# Patient Record
Sex: Female | Born: 1965 | Race: White | Hispanic: No | Marital: Married | Smoking: Never smoker
Health system: Southern US, Community
[De-identification: ages and names within clinical notes are randomized; demographics above are authoritative.]

## PROBLEM LIST (undated history)

## (undated) DIAGNOSIS — T8859XA Other complications of anesthesia, initial encounter: Secondary | ICD-10-CM

## (undated) DIAGNOSIS — Z860100 Personal history of colon polyps, unspecified: Secondary | ICD-10-CM

## (undated) DIAGNOSIS — T7840XA Allergy, unspecified, initial encounter: Secondary | ICD-10-CM

## (undated) DIAGNOSIS — M1990 Unspecified osteoarthritis, unspecified site: Secondary | ICD-10-CM

## (undated) DIAGNOSIS — F419 Anxiety disorder, unspecified: Secondary | ICD-10-CM

## (undated) DIAGNOSIS — Z87820 Personal history of traumatic brain injury: Secondary | ICD-10-CM

## (undated) DIAGNOSIS — T4145XA Adverse effect of unspecified anesthetic, initial encounter: Secondary | ICD-10-CM

## (undated) DIAGNOSIS — M75100 Unspecified rotator cuff tear or rupture of unspecified shoulder, not specified as traumatic: Secondary | ICD-10-CM

## (undated) DIAGNOSIS — I10 Essential (primary) hypertension: Secondary | ICD-10-CM

## (undated) DIAGNOSIS — K219 Gastro-esophageal reflux disease without esophagitis: Secondary | ICD-10-CM

## (undated) DIAGNOSIS — K5730 Diverticulosis of large intestine without perforation or abscess without bleeding: Secondary | ICD-10-CM

## (undated) DIAGNOSIS — G43909 Migraine, unspecified, not intractable, without status migrainosus: Secondary | ICD-10-CM

## (undated) DIAGNOSIS — L719 Rosacea, unspecified: Secondary | ICD-10-CM

## (undated) HISTORY — PX: VAGINAL HYSTERECTOMY: SUR661

## (undated) HISTORY — DX: Gastro-esophageal reflux disease without esophagitis: K21.9

## (undated) HISTORY — PX: COSMETIC SURGERY: SHX468

## (undated) HISTORY — PX: LAPAROSCOPIC CHOLECYSTECTOMY: SUR755

## (undated) HISTORY — DX: Allergy, unspecified, initial encounter: T78.40XA

## (undated) HISTORY — DX: Unspecified osteoarthritis, unspecified site: M19.90

## (undated) HISTORY — PX: REDUCTION MAMMAPLASTY: SUR839

## (undated) HISTORY — PX: WISDOM TOOTH EXTRACTION: SHX21

## (undated) HISTORY — DX: Essential (primary) hypertension: I10

---
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
## 1999-09-01 ENCOUNTER — Other Ambulatory Visit: Admission: RE | Admit: 1999-09-01 | Discharge: 1999-09-01 | Payer: Self-pay | Admitting: Obstetrics & Gynecology

## 2001-04-05 ENCOUNTER — Other Ambulatory Visit: Admission: RE | Admit: 2001-04-05 | Discharge: 2001-04-05 | Payer: Self-pay | Admitting: Obstetrics & Gynecology

## 2002-02-12 ENCOUNTER — Encounter (INDEPENDENT_AMBULATORY_CARE_PROVIDER_SITE_OTHER): Payer: Self-pay | Admitting: Specialist

## 2002-02-12 ENCOUNTER — Observation Stay (HOSPITAL_COMMUNITY): Admission: RE | Admit: 2002-02-12 | Discharge: 2002-02-13 | Payer: Self-pay | Admitting: Obstetrics & Gynecology

## 2002-02-13 ENCOUNTER — Inpatient Hospital Stay (HOSPITAL_COMMUNITY): Admission: AD | Admit: 2002-02-13 | Discharge: 2002-02-13 | Payer: Self-pay | Admitting: Obstetrics and Gynecology

## 2002-03-05 ENCOUNTER — Ambulatory Visit (HOSPITAL_COMMUNITY): Admission: RE | Admit: 2002-03-05 | Discharge: 2002-03-05 | Payer: Self-pay | Admitting: Plastic Surgery

## 2002-03-05 ENCOUNTER — Encounter (INDEPENDENT_AMBULATORY_CARE_PROVIDER_SITE_OTHER): Payer: Self-pay | Admitting: Specialist

## 2007-09-25 ENCOUNTER — Encounter: Admission: RE | Admit: 2007-09-25 | Discharge: 2007-09-25 | Payer: Self-pay | Admitting: Obstetrics & Gynecology

## 2008-02-19 ENCOUNTER — Ambulatory Visit (HOSPITAL_COMMUNITY): Admission: RE | Admit: 2008-02-19 | Discharge: 2008-02-19 | Payer: Self-pay | Admitting: Obstetrics & Gynecology

## 2008-06-03 ENCOUNTER — Ambulatory Visit (HOSPITAL_COMMUNITY): Admission: RE | Admit: 2008-06-03 | Discharge: 2008-06-03 | Payer: Self-pay | Admitting: General Surgery

## 2008-06-03 ENCOUNTER — Encounter (INDEPENDENT_AMBULATORY_CARE_PROVIDER_SITE_OTHER): Payer: Self-pay | Admitting: General Surgery

## 2008-09-25 ENCOUNTER — Encounter: Admission: RE | Admit: 2008-09-25 | Discharge: 2008-09-25 | Payer: Self-pay | Admitting: Specialist

## 2010-10-10 ENCOUNTER — Encounter: Payer: Self-pay | Admitting: Obstetrics & Gynecology

## 2011-02-01 NOTE — Op Note (Signed)
Monica Petty, Monica Petty NO.:  [id number]   MEDICAL RECORD NO.:  [id number]          PATIENT TYPE:  AMB   LOCATION:  DAY                          FACILITY:  [facility]   PHYSICIAN:  Adolph Pollack, M.D.DATE OF BIRTH:  [birth year]   DATE OF PROCEDURE:  06/03/2008  DATE OF DISCHARGE:  06/03/2008                               OPERATIVE REPORT   PREOPERATIVE DIAGNOSIS:  Gallbladder polyp.   POSTOPERATIVE DIAGNOSIS:  Gallbladder polyp.   PROCEDURE:  Laparoscopic cholecystectomy, intraoperative cholangiogram.   SURGEON:  Adolph Pollack, M.D.   ASSISTANT:  Ollen Gross. Vernell Morgans, M.D.   ANESTHESIA:  General.   INDICATIONS:  Mrs. Sahakian is a 45 year old female who has been having  intermittent bouts of right upper quadrant discomfort with nausea.  She  always can feel some discomfort.  She underwent an abdominal ultrasound  that demonstrated what appeared to be at least an 8-mm size polyp but no  wall thickening and pericholecystic fluid.  She continues to have  symptoms and has elected to proceed with laparoscopic cholecystectomy.  The true nature of the polyp is unknown.   TECHNIQUE:  She was brought to the operating room, placed supine on the  operating table and a general anesthetic was administered.  Her  abdominal wall was sterilely prepped and draped.  Marcaine solution was  infiltrated into the subumbilical region.  A small subumbilical incision  was made through the skin, subcutaneous tissue, fascia and peritoneum,  entering the peritoneal cavity under direct vision.  A pursestring  suture of 0 Vicryl was placed  around the fascial edges.  A Hasson  trocar was introduced into the peritoneal cavity and pneumoperitoneum  created by insufflation of CO2 gas.   Next the laparoscope was introduced.  The ovaries and appendix appeared  normal.  The liver appeared to be normal.   She was placed in reverse Trendelenburg position, the right side tilted  slightly up.   An 11-mm trocar was placed in an epigastric incision and  two 5-mm trocars were placed in the right upper quadrant.  The fundus of  gallbladder was grasped, and there were no acute inflammatory changes.  It was retracted toward the right shoulder.  The infundibulum was  grasped and using blunt dissection on the gallbladder, the infundibulum  was mobilized.  I then identified the cystic duct and created a window  around it.  I also identified an anterior branch of the cystic artery.  I create a window around the anterior branch of the cystic artery which  was anterior to the cystic duct.  This anterior branch of the cystic  artery was then clipped and divided.  I then placed a clip at the  gallbladder cystic duct junction and made an incision in the cystic  duct, milking some bile back.  The cholangiocatheter was passed through  the anterior abdominal wall, placed into the cystic duct, and a  cholangiogram was performed.   Under real time fluoroscopy dilute contrast was injected into the cystic  duct, which was of moderate-to-long length.  The common hepatic, right  and left hepatic, common  bile ducts all filled promptly and contrast  drained promptly into the duodenum without obvious evidence of  obstruction.  Final report is pending the radiologist's interpretation.   The cholangiocatheter was removed, the cystic duct was then clipped 3  times on the biliary side and divided.  I identified the posterior  branch of the cystic artery close to gallbladder and clipped and divided  it.  The gallbladder was then dissected from the liver using  electrocautery.  There was a small spillage of bile.  The gallbladder  then placed in an Endopouch sack.   The gallbladder fossa was copiously irrigated with saline solution and  bleeding controlled with electrocautery.  The area was then inspected  again and there was no bleeding or bile leak.  I evacuated the fluid as  much as possible.   The  gallbladder was then removed in the bag through the subumbilical  port.  The subumbilical fascial defect was closed under laparoscopic  vision by tightening up and tying down the pursestring suture.  The  remaining trocars were removed and pneumoperitoneum was released.  The  skin incisions were closed with 4-0 Monocryl subcuticular stitches,  followed by Steri-Strips and sterile dressings.  She tolerated the  procedure without any apparent complications and was taken to recovery  in satisfactory condition.      Adolph Pollack, M.D.  Electronically Signed     TJR/MEDQ  D:  06/03/2008  T:  06/04/2008  Job:  [id number]   cc:   Freddy Finner, M.D.  Fax: [phone]

## 2011-02-04 NOTE — Op Note (Signed)
[facility]  Patient:    Monica Petty, Monica Petty Visit Number: [id number] MRN: [id number]          Service Type: GYN Location: 910A 9118 01 Attending Physician:  Minette Headland Dictated by:   Freddy Finner, M.D. Proc. Date: 02/12/02 Admit Date:  02/12/2002                             Operative Report  PREOPERATIVE DIAGNOSES:       1. Uterine fibroid.                               2. Dysfunctional uterine bleeding unresponsive                                  to oral contraceptives and cyclic progestin                                  therapy.  POSTOPERATIVE DIAGNOSES:      1. Uterine fibroid.                               2. Dysfunctional uterine bleeding unresponsive                                  to oral contraceptives and cyclic progestin                                  therapy.  OPERATION:                    Total vaginal hysterectomy.  SURGEON:                      Freddy Finner, M.D.  ASSISTANT:                    Marcelle Overlie, M.D.  ANESTHESIA:                   Spinal with intravenous sedation.  ESTIMATED BLOOD LOSS:         Less than or equal to 100 cc.  COMPLICATIONS:                None.  INDICATIONS:                  The patient is a 45 year old whose Present Illness is recorded in the admission note.  She was admitted on the day of surgery.  DESCRIPTION OF PROCEDURE:     She was given a bolus of Cefotan IV, plus the PAS hose, and brought to the operating room, and there placed under adequate spinal anesthesia.  Placed in the dorsolithotomy position.  Prep of the lower abdomen, perineum, and vagina was carried out in the usual fashion.  The bladder was evacuated with the [facility] catheter.  Sterile drapes were applied.  A posterior weighted vaginal retractor was placed.  The cervix was grasped with the [facility] tenaculum.  The mucosa posterior to the cervix was tinted with an Allis and  incised sharply with the Mayo  scissors to enter the peritoneal cavity.  The cervix was circumscribed with the scalpel.  The LigaSure system was then used to develop the uterosacral ligaments which were sealed and divided sharply.  The bladder was advanced off the cervix.  Bladder pillars were taken with the LigaSure system, sealed, and divided.  The bladder was further advanced off the cervix and the anterior peritoneum entered. Cardinal ligament pedicles were taken with the LigaSure system, sealed and divided.  Vascular pedicles were taken with the same system in a similar fashion.  The uterus was delivered through the vaginal introitus. Utero-ovarian pedicles were sealed with the LigaSure system and divided sharply.  Hemostasis was adequate at this level.  Tubes and ovaries were inspected and normal.  A moist tape was placed to improve exposure.  The ankles of the vagina were anchored to the uterosacrals with a mattress suture of 0 Monocryl.  The uterosacrals were plicated and the posterior peritoneum closed with interrupted 2-0 Monocryl.  The cuff was closed vertically with figure-of-eights of 0 Monocryl.  Hemostasis was complete.  Foley catheter was placed.  The patient was taken to recovery in good condition. Dictated by:   Freddy Finner, M.D. Attending Physician:  Minette Headland DD:  02/12/02 TD:  02/12/02 Job: [id number]

## 2011-02-04 NOTE — Discharge Summary (Signed)
[facility]  Patient:    Monica Petty, Monica Petty Visit Number: [id number] MRN: [id number]          Service Type: MED Location: [facility] Attending Physician:  Marcelle Overlie Dictated by:   Freddy Finner, M.D. Admit Date:  02/13/2002 Disc. Date: 02/13/02                             Discharge Summary  DISCHARGE DIAGNOSIS:          Uterine leiomyomata, menometrorrhagia                               nonresponsive to conservative management.  OPERATIVE PROCEDURE:          Total vaginal hysterectomy.  POSTOPERATIVE COMPLICATIONS:    None.  DISPOSITION:                  The patient was in satisfactory and improved condition at the time of discharge.  She is to have progressively increasing physical activity without heavy lifting or vaginal entry.  She is to report fever or heavy bleeding.  She is to return to the office in two weeks for follow up. She is to take a regular diet.  She is given Darvocet to be taken as needed for postoperative pain.  Details of the HISTORY OF PRESENT ILLNESS, PAST MEDICAL HISTORY, FAMILY HISTORY, REVIEW OF SYSTEMS, and PHYSICAL EXAMINATION:  According to the admission note.  PHYSICAL FINDINGS:            On admission were remarkable for the pelvic exam with uterine enlargement secondary to fibroids which was confirmed by pelvic ultrasound.  LABORATORY DATA:              Preoperative hemoglobin of 12.2, postoperative hemoglobin of 12.4.  Prothrombin time, PTT and INR were normal on admission.  HOSPITAL COURSE:              The patient was admitted on the morning of surgery.  She was placed in PAS hose.  She was given a gram of Cefotan IV preoperatively.  She was brought to the operating room where the above described procedure was accomplished under spinal anesthesia.  Her postoperative course was without complication.  She remained afebrile for 24 hours postoperatively. Her postoperative hemoglobin was adequate.  By  the morning of the first postoperative day she was ambulating without difficulty, having adequate bowel and bladder function.  She was taking a regular diet. She was discharged home with disposition as noted above. Dictated by:   Freddy Finner, M.D. Attending Physician:  Marcelle Overlie DD:  02/13/02 TD:  02/14/02 Job: [id number]

## 2011-02-04 NOTE — H&P (Signed)
[name]  Patient:    Monica Petty, Monica Petty. Visit Number: [id number] MRN: [id number]          Service Type: Attending:  Freddy Finner, M.D. Dictated by:   Freddy Finner, M.D. Adm. Date:  02/12/02                           History and Physical  ADMITTING DIAGNOSIS:          Uterine leiomyomata and menometrorrhagia, unresponsive to oral contraceptives and cyclic progestin administration.  HISTORY OF PRESENT ILLNESS:   The patient is a 45 year old white married female, gravida 2, para 2, who has had a long history of dysfunctional uterine bleeding with long periods of persistent bleeding unresponsive to oral contraceptives and to cyclic progestin withdrawal.  She has also experienced some increasing blood pressure on oral contraceptives.  She is known to have fibroids documented by clinical exam and by pelvic ultrasound.  She has requested definitive surgical intervention and is admitted at this time for vaginal hysterectomy.  The patient is known to have hypertension in the past on hormonal therapies.  REVIEW OF SYSTEMS:            Otherwise negative.  No cardiopulmonary, GI or GU complaints.  PAST MEDICAL HISTORY:         The patient has no known significant medical illnesses.  She has environmental allergies.  She has previously had head trauma from a skull fracture while riding a horse.  She has had two vaginal births.  She has never had a blood transfusion.  ALLERGIES TO MEDICATIONS:     CODEINE and PENICILLIN.  SOCIAL HISTORY:               She only occasionally uses alcohol socially. She does not use cigarettes.  FAMILY HISTORY:               Noncontributory.  PHYSICAL EXAMINATION:  VITAL SIGNS:                  Blood pressure 130/79 on her check at the hospital (she is a Designer, jewellery at [employer]).  HEENT:                        Grossly within normal limits.  NECK:                         Thyroid gland is  not palpably enlarged.  CHEST:                        Clear to auscultation.  HEART:                        Normal sinus rhythm without murmurs, rubs or gallops.  ABDOMEN:                      Soft and nontender without appreciable organomegaly or palpable masses.  EXTREMITIES:                  Without cyanosis, clubbing or edema.  PELVIC EXAM:                  External genitalia, vagina, cervix are normal. Bimanual reveals the uterus to be slightly enlarged.  There are no palpable adnexal masses.  The rectum  is palpably normal. Rectovaginal exam confirms.  BREAST EXAM:                  Normal.  Recent baseline mammogram in August of 2002 was normal.  ASSESSMENT:                   Uterine fibroids and dysfunctional uterine bleeding, unresponsive to hormone therapy.  PLAN:                         Total vaginal hysterectomy.  The patient has reviewed a video in the office describing the operative procedure, the potential for additional procedures including laparoscopy at the time of surgery has been discussed and accepted.  The patient is admitted now for surgery. Dictated by:   Freddy Finner, M.D. Attending:  Freddy Finner, M.D. DD:  02/11/02 TD:  02/11/02 Job: [id number]

## 2011-02-04 NOTE — Op Note (Signed)
[facility]  Patient:    Monica Petty, Monica Petty Visit Number: [id number] MRN: [id number]          Service Type: DSU Location: 9300 [id number] 01 Attending Physician:  Loura Halt Ii Dictated by:   Alfredia Ferguson, M.D. Proc. Date: 03/05/02 Admit Date:  03/05/2002 Discharge Date: 03/05/2002                             Operative Report  PREOPERATIVE DIAGNOSIS:       Bilateral macromastia with excess fat in bilateral axillary area.  POSTOPERATIVE DIAGNOSIS:      Bilateral macromastia with excess fat in bilateral axillary area.  OPERATION:                    Bilateral reduction mammoplasty.  Left side 1060 grams removed, right side 970 grams removed.  Bilateral axillary suction-assisted lipoplasty.  Left side 140 grams removed, right side 125 grams removed.  SURGEON:                      Alfredia Ferguson, M.D.  ASSISTANT:  ANESTHESIA:                   General endotracheal anesthesia.  ESTIMATED BLOOD LOSS:  INDICATIONS:                  This is a 45 year old woman who has DD breasts who complains of their weight.  This excess weight causes neck and back pain. She wishes to have her breasts reduced to a full C small D if possible.  The patient understands the risks of surgery including overreduction, underreduction, bleeding, hematoma, infection, unsightly scarring, permanent numbness of the nipple, vascular compromise to the nipple with loss of the nipple due to lack of circulation, inability to lactate, asymmetry, and overall dissatisfaction with the results.  She also has a moderate amount of excess fat which extends around her axilla into her back.  The patient is moderately overweight.  Rather than leaving this excess skin and fat, my plan is to carry out liposuction in the axillary region.  Hopefully this will contour this area down, so that the fullness in the axillary region will not be as noticable.  The patient understands the risks  of liposuction including overreduction, underreduction, contour irregularities, infection, bleeding, seroma, pain, and overall dissatisfaction with the results.  DESCRIPTION OF PROCEDURE:     With the patient in the standing position, skin marks were placed outlining a Wise skin pattern and an inferior pedicle technique.  Skin marks were also placed in the bilateral axillary region where the excess fat extended around to the back.  The patient was taken to the operating room where she was given general endotracheal anesthesia.  Chest was prepped with Betadine and she was draped with sterile drapes.  Attention was first directed to the right breast.  An 8 cm wide inferiorly-based pedicle was marked and within the confines of this pedicle, a 42 mm diameter circle was drawn around the nipple.  This skin mark outlining the pedicle was incised and the 42 mm diameter circle around the nipple was also incised.  The pedicle was deepithelialized.  The pedicle was now dissected away from the surrounding breast tissue dissecting straight down to the chest wall medially and obliquely down to the chest wall superiorly and laterally to ensure adequate connections to the chest wall for  vascular integrity of the pedicle.  Once the pedicle had been dissected away from the surrounding breast tissue, the remainder of my incisions were made at the previously placed skin marks.  The medial, superior, and lateral breast flaps were now elevated away from the excess dermaglandular tissue.  The medial breast flap was thinned to approximately 3 to 4 cm in thickness.  A similar thickness in the superior breast flap was left behind.  The lateral breast flap was thinned to approximately 2 to 3 cm in thickness.  Once these flaps had been elevated up to the level of the chest wall, this left the excess tissue still attached to the chest wall between the pedicle and the dissected breast flaps.  The excess dermaglandular  tissue was now removed from the chest wall beginning medially and dissecting it off the chest wall from a medial to a lateral direction. Tissue was passed off for weight.  The right side weighed a total of 970 grams.  Nipple areolar complex appeared to be viable.  The wound was copiously irrigated using saline irrigation.  Hemostasis was carried out meticulously throughout the dissection to minimize blood loss.  Once hemostasis had been assured, tumescent solution was infiltrated into the right axillary region. The tumescent solution consisted of 1 liter of Ringers lactate plus 20 cc of 1% Xylocaine plain plus 1 amp of epinephrine 1:1000.  A total of approximately 400 cc was infiltrated in the area that liposuction would be carried out. Once the wound was inspected again for hemostasis, closure was begun.  The inferior corner of the vertical limb of my incision was approximated using a 2-0 Vicryl suture to the midportion of the inframammary crease incision. A similar suture was used to approximate the superior corner of the medial and lateral breast flaps at the vertical incision.  The nipple areolar complex was placed in its new location.  Temporary staples were placed to approximate my skin edges.  Prior to completely closing the incision, liposuction was carried out in the right axillary region.  A 3.4 mm cannula was used.  The area of excess fat was pretunneled and then suction was applied.  Suction carried out in nearly a bloodless fashion.  A total of 125 cc of fat was removed.  This was separated fat with minimal infranatent.  The skin contoured down nicely. Closure was now carried out simultaneous to me performing the left-sided reduction.  The left-sided reduction was carried out in an identical fashion to the right side.  A total of 1060 grams was removed from the left side. Again prior to beginning closure, tumescent solution was infiltrated in the area where liposuction was to be  carried out in the left axillary region. 400 cc was also placed in the left axillary region.  The wound on the left was  copiously irrigated with saline irrigation and hemostasis was assured.  The wound was temporarily approximated in a similar fashion to the right side. Liposuction was then carried out removing a total of 140 grams from the left side. The skin contoured down nicely.  Closure was now carried out on the left side in an identical fashion to the right side.  Closure on both sides consisted of interrupted 2-0 and 3-0 Vicryl for the dermis for the incisions, a running 4-0 Monocryl subcuticular was placed in the skin edges.  Symmetry appeared to be acceptable at the conclusion of the procedure.  Nipple areolar complexes were viable.  The patients chest was cleansed with  Betadine and dried.  Steri-Strips were applied to the incision.  A bulky dressing was placed around the wound.  A circumferential wrap using two 6 inch Ace bandages were placed.  The patient was awakened, extubated, and transported to the recovery room in satisfactory condition. Dictated by:   Alfredia Ferguson, M.D. Attending Physician:  Loura Halt Ii DD:  03/05/02 TD:  03/06/02 Job: [id number]

## 2011-06-22 LAB — DIFFERENTIAL
Basophils Absolute: 0
Basophils Relative: 1
Eosinophils Absolute: 0.1
Monocytes Relative: 8
Neutro Abs: 4.4
Neutrophils Relative %: 61

## 2011-06-22 LAB — COMPREHENSIVE METABOLIC PANEL
ALT: 21
Alkaline Phosphatase: 53
BUN: 13
CO2: 29
Chloride: 107
GFR calc non Af Amer: >60
Glucose, Bld: 115 — ABNORMAL HIGH
Potassium: 4
Sodium: 144
Total Bilirubin: 0.7
Total Protein: 6.3

## 2011-06-22 LAB — CBC
HCT: 42.3
Hemoglobin: 14.2
RBC: 4.65
RDW: 13.1
WBC: 7.1

## 2014-12-02 ENCOUNTER — Ambulatory Visit: Payer: Self-pay | Attending: Orthopedic Surgery | Admitting: Occupational Therapy

## 2014-12-02 ENCOUNTER — Encounter: Payer: Self-pay | Admitting: Occupational Therapy

## 2014-12-02 DIAGNOSIS — R29898 Other symptoms and signs involving the musculoskeletal system: Secondary | ICD-10-CM | POA: Insufficient documentation

## 2014-12-02 DIAGNOSIS — M25521 Pain in right elbow: Secondary | ICD-10-CM | POA: Insufficient documentation

## 2014-12-02 NOTE — Therapy (Signed)
[facility] [address] [place], [place], [address] Phone: [phone]   Fax:  [phone]  Occupational Therapy Evaluation  Patient Details  Name: Monica Petty MRN: [id number] Date of Birth: 03-May-1966 Referring Provider:  Roseanne Kaufman, MD  Encounter Date: 12/02/2014      OT End of Session - 12/02/14 1403    Visit Number 1   Number of Visits 17   Date for OT Re-Evaluation 02/01/15   Authorization Type Workers Comp approved 12 visits    Authorization - Visit Number 1   Authorization - Number of Visits 12   OT Start Time 1105   OT Stop Time 1148   OT Time Calculation (min) 43 min   Activity Tolerance Patient tolerated treatment well   Behavior During Therapy [facility] for tasks assessed/performed      No past medical history on file.  Past Surgical History  Procedure Laterality Date  . Abdominal hysterectomy    . Breast surgery      reduction  . Wisdom tooth extraction      There were no vitals filed for this visit.  Visit Diagnosis:  Pain, elbow joint, right  Weakness of right arm      Subjective Assessment - 12/02/14 1111    Symptoms pain    Pertinent History Pt injured 09/08/14 and 09/23/14 moving large pts while at work in [place] at [employer].  Pt is now wearing elbow extension brace at night and had 1 PT evaluation (scheduled by mistake as pt required to have therapy at [facility] due to Workers Comp)   Limitations light duty at work   Repetition Increases Symptoms   Patient Stated Goals use of arm/elbow without pain   Currently in Pain? Yes   Pain Score --  0-5/10   Pain Location Arm  elbow   Pain Orientation Right;Medial;Lateral   Pain Descriptors / Indicators Sore;Dull   Pain Onset More than a month ago   Aggravating Factors  pressure at elbow, use   Pain Relieving Factors Voltaran gel, massage, heat, cold   Effect of Pain on Daily Activities on light duty at work           [facility] OT  Assessment - 12/02/14 0001    Assessment   Diagnosis Medial epicondylitis of R elbow, lateral epicondylitis of R elbow, pain and swelling of R elbow   Onset Date 09/08/14  also injured 09/23/14   Assessment script includes modalities prn and exercise specificallly   Prior Therapy 1 PT session at [facility]   Precautions   Precaution Comments no pulling, no lifting more than 20lbs, light duty at work   Required Braces or Orthoses --  has elbow extension splint for night wear   Balance Screen   Has the patient fallen in the past 6 months No   Has the patient had a decrease in activity level because of a fear of falling?  No   Is the patient reluctant to leave their home because of a fear of falling?  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 using LUE    Prior Level of Function Meal Prep indpendent   Meal Prep --  difficulty opening jars, cooking less, using L hand   Community Mobility Drives own vehicle  some pain, but pt has moved steering wheel   Mobility   Mobility Status Independent   Written Expression   Dominant Hand Right   Handwriting --  mild pain   Observation/Other Assessments   Quick DASH  34.09%   Sensation   Additional Comments denies numbness/tingling   Coordination   Gross Motor Movements are Fluid and Coordinated Yes   Fine Motor Movements are Fluid and Coordinated Yes   ROM / Strength   AROM / PROM / Strength  AROM;Strength   AROM   Overall AROM  Within functional limits for tasks performed   Hand Function   Right Hand Grip (lbs) 52lbs with elbow bent 3/10 pain 48lbs with 7/10 pain with arm extended   Right Hand Lateral Pinch 14 lbs   Right Hand 3 Point Pinch 12 lbs   Left Hand Grip (lbs) 80lbs with elbow bent and extended   Left Hand Lateral Pinch 16.5 lbs   Left 3 point pinch 17 lbs                         OT Education - 12/02/14 1353    Education provided Yes   Education Details Use of Moist Heat, Massage, and AROM stretches #1-2 for lateral and medial epicondylitis, wrist splint wear.   Person(s) Educated Patient   Methods Explanation;Demonstration;Verbal cues;Handout   Comprehension Verbalized understanding;Returned demonstration          OT Short Term Goals - 12/02/14 1415    OT SHORT TERM GOAL #1   Title Pt will be independent with stretching HEP.--STGs due 01/02/15   Time 4   Period Weeks   Status New   OT SHORT TERM GOAL #2   Title Pt will verbalize understanding of activity modifcations to decrease pain prn including splint wear/care.   Time 4   Period Weeks   Status New   OT SHORT TERM GOAL #3   Title Pt will report pain less than or equal to 3/10 consistently for light activity.   Time 4   Period Weeks   Status New   OT SHORT TERM GOAL #4   Title Pt improve grip strength to at least 60lbs for gripping/lifting tasks.   Baseline 48-52 with 3-7/10 pain (increased with elbow extended)   Time 4   Period Weeks   Status New           OT Long Term Goals - 12/02/14 1416    OT LONG TERM GOAL #1   Title Pt will be independent with strengthening HEP when appropriate.--LTGs due 02/01/15   Time 8   Period Weeks   Status New   OT LONG TERM GOAL #2   Title Pt improve grip strength to at least 75lbs for gripping/lifting tasks.   Baseline 48-52 with 3-7/10 pain (increased with elbow extended)   Time 8   Period Weeks   Status New   OT LONG TERM GOAL  #3   Title Pt will be able to retrieve 5lb object from overhead shelf with pain less than 1/10 for ADLs and lifting tasks.   Time 8   Period Weeks  Status New   OT LONG TERM GOAL #4   Title Pt will be able to lift/carry 35lbs with both UEs with less than 1/10 pain in prep for work activities.   Time 8   Period Weeks   Status New   OT LONG TERM GOAL #5   Title Pt will improve dominant RUE functional use as shown by rating Quick DASH at 15% disability or less.   Baseline 34.09%   Time 8   Period Weeks   Status New               Plan - 12/02/14 1410    Clinical Impression Statement Pt works at [employer] arrives today s/p injuries 09/08/14 and 09/23/14 while moving heaving pts on OT table.  Pt presents with diagnosis of medial and lateral epicondylitis of dominant R elbow with resulting pain, decreased strength, and decreased UE functional use for ADLs and work tasks.  Pt currently on light duty.     Pt will benefit from skilled therapeutic intervention in order to improve on the following deficits (Retired) Pain;Impaired UE functional use;Decreased knowledge of use of DME;Decreased strength;Decreased activity tolerance;Increased edema   Rehab Potential Good   Clinical Impairments Affecting Rehab Potential none   OT Frequency 2x / week   OT Duration 8 weeks  +eval   OT Treatment/Interventions Self-care/ADL training;Moist Heat;Fluidtherapy;DME and/or AE instruction;Splinting;Patient/family education;Therapeutic exercises;Contrast Bath;Ultrasound;Therapeutic exercise;Therapeutic activities;Passive range of motion;Iontophoresis;Cryotherapy;Electrical Stimulation;Manual Therapy;Neuromuscular education   Plan modalities, review HEP and update prn   Consulted and Agree with Plan of Care Patient        Problem List There are no active problems to display for this patient.   [facility] 12/02/2014, 2:30 PM  [facility] [address] [place], [place], [address] Phone: [phone]   Fax:  [place], OTR/L 12/02/2014 2:30 PM

## 2014-12-02 NOTE — Patient Instructions (Addendum)
Wear wrist splint during repetitive and resistive activities involving hand.  Moist heat x10 min 1-2 times/day  Then massage for 5 min 2x/day.  (medial and lateral forearm as shown)  Then perform stretches 10x each, hold 15sec, 3x/day:  Medial epicondylitis: 1.  With elbow bent and thumb facing up, bend wrist away from body. 2.  With elbow bent turn palm up, extend wrist back.  Lateral epicondylitis: 1. With elbow bent and thumb facing up, bend wrist toward body. 2. With elbow bent, turn palm down and bend wrist down.

## 2014-12-10 ENCOUNTER — Ambulatory Visit: Payer: PRIVATE HEALTH INSURANCE | Attending: Orthopedic Surgery | Admitting: Occupational Therapy

## 2014-12-10 DIAGNOSIS — M25521 Pain in right elbow: Secondary | ICD-10-CM | POA: Insufficient documentation

## 2014-12-10 DIAGNOSIS — R29898 Other symptoms and signs involving the musculoskeletal system: Secondary | ICD-10-CM | POA: Insufficient documentation

## 2014-12-10 NOTE — Therapy (Signed)
[facility] [address] [place], [place], [address] Phone: [phone]   Fax:  [phone]  Occupational Therapy Treatment  Patient Details  Name: Monica Petty MRN: [id number] Date of Birth: Feb 03, 1966 Referring Provider:  Roseanne Kaufman, MD  Encounter Date: 12/10/2014    No past medical history on file.  Past Surgical History  Procedure Laterality Date  . Abdominal hysterectomy    . Breast surgery      reduction  . Wisdom tooth extraction      There were no vitals filed for this visit.  Visit Diagnosis:  Pain, elbow joint, right  Weakness of right arm      Subjective Assessment - 12/10/14 1657    Symptoms pain    Pertinent History Pt injured 09/08/14 and 09/23/14 moving large pts while at work in [place] at [place].  Pt is now wearing elbow extension brace at night and had 1 PT evaluation (scheduled by mistake as pt required to have therapy at [facility] due to Workers Comp)   Limitations light duty at work   Repetition Increases Symptoms   Patient Stated Goals use of arm/elbow without pain   Currently in Pain? Yes   Pain Score 3    Pain Location Arm   Pain Orientation Right;Medial;Lateral   Pain Descriptors / Indicators Sore   Pain Type Acute pain   Pain Onset More than a month ago   Aggravating Factors  use   Pain Relieving Factors votaren gel, repositioning   Effect of Pain on Daily Activities on light duty   Multiple Pain Sites Yes                    OT Treatments/Exercises (OP) - 12/10/14 0001    Ultrasound   Ultrasound Location right lateral and medial elbow/ forearm   Ultrasound Parameters 3 MHz, 0.8 w/cm2, 20 % x 8 mins each  for lateral then medial elbow/ forearm   Ultrasound Goals Edema;Pain     Treatment  Review of previously  A/ROM HEP exercises 10 reps each. Therapist initiated education regarding activity modification(ie: neutral  wrist with answering phones, wearing  wrist brace when making beds,)  Therapist recommended that pt massage to medial and lateral elbow and application of heat. Pt was provided with a compression(tensogrip) sleeve for right elbow and forearm to wear at night.Therapist requested pt bring in her elbow splint that she is wearing at night time to see if it needs adjustment.             OT Short Term Goals - 12/02/14 1415    OT SHORT TERM GOAL #1   Title Pt will be independent with stretching HEP.--STGs due 01/02/15   Time 4   Period Weeks   Status New   OT SHORT TERM GOAL #2   Title Pt will verbalize understanding of activity modifcations to decrease pain prn including splint wear/care.   Time 4   Period Weeks   Status New   OT SHORT TERM GOAL #3   Title Pt will report pain less than or equal to 3/10 consistently for light activity.   Time 4   Period Weeks   Status New   OT SHORT TERM GOAL #4   Title Pt improve grip strength to at least 60lbs for gripping/lifting tasks.   Baseline 48-52 with 3-7/10 pain (increased with elbow extended)   Time 4   Period Weeks   Status New  OT Long Term Goals - 12/02/14 1416    OT LONG TERM GOAL #1   Title Pt will be independent with strengthening HEP when appropriate.--LTGs due 02/01/15   Time 8   Period Weeks   Status New   OT LONG TERM GOAL #2   Title Pt improve grip strength to at least 75lbs for gripping/lifting tasks.   Baseline 48-52 with 3-7/10 pain (increased with elbow extended)   Time 8   Period Weeks   Status New   OT LONG TERM GOAL #3   Title Pt will be able to retrieve 5lb object from overhead shelf with pain less than 1/10 for ADLs and lifting tasks.   Time 8   Period Weeks   Status New   OT LONG TERM GOAL #4   Title Pt will be able to lift/carry 35lbs with both UEs with less than 1/10 pain in prep for work activities.   Time 8   Period Weeks   Status New   OT LONG TERM GOAL #5   Title Pt will improve dominant RUE functional use as shown by  rating Quick DASH at 15% disability or less.   Baseline 34.09%   Time 8   Period Weeks   Status New               Plan - 12/10/14 1659    Clinical Impression Statement Pt with medial and lateral epicondylitis is progressing towards goals.   Plan modalities, progress HEP   OT Home Exercise Plan 12/10/14- therapist reviewed previously issued HEP.   Consulted and Agree with Plan of Care Patient        Problem List There are no active problems to display for this patient.   Irl Bodie 12/10/2014, 5:05 PM Theone Murdoch, OTR/L [phone] Phone: [phone] 5:06 PM 12/10/2014 [facility] [address] [place], [place], [address] Phone: [phone]   Fax:  [phone]

## 2014-12-16 ENCOUNTER — Encounter: Payer: Self-pay | Admitting: Occupational Therapy

## 2014-12-16 ENCOUNTER — Ambulatory Visit: Payer: PRIVATE HEALTH INSURANCE | Attending: Orthopedic Surgery | Admitting: Occupational Therapy

## 2014-12-16 DIAGNOSIS — R29898 Other symptoms and signs involving the musculoskeletal system: Secondary | ICD-10-CM | POA: Diagnosis not present

## 2014-12-16 DIAGNOSIS — M25521 Pain in right elbow: Secondary | ICD-10-CM | POA: Insufficient documentation

## 2014-12-16 NOTE — Therapy (Signed)
[facility] [address] [place], [place], [address] Phone: [phone]   Fax:  [phone]  Occupational Therapy Treatment  Patient Details  Name: Monica Petty MRN: [id number] Date of Birth: [birth year] Referring Provider:  Roseanne Kaufman, MD  Encounter Date: 12/16/2014      OT End of Session - 12/16/14 1508    Visit Number 3   Number of Visits 17   Date for OT Re-Evaluation 02/01/15   Authorization Type Workers Comp approved 12 visits    Authorization - Visit Number 3   Authorization - Number of Visits 12   OT Start Time 1404   OT Stop Time 1446   OT Time Calculation (min) 42 min   Activity Tolerance Patient tolerated treatment well   Behavior During Therapy [facility] for tasks assessed/performed      History reviewed. No pertinent past medical history.  Past Surgical History  Procedure Laterality Date  . Abdominal hysterectomy    . Breast surgery      reduction  . Wisdom tooth extraction      There were no vitals filed for this visit.  Visit Diagnosis:  Pain, elbow joint, right  Weakness of right arm      Subjective Assessment - 12/16/14 1441    Symptoms it felt sore for a few days after therapy last time   Pertinent History Pt injured 09/08/14 and 09/23/14 moving large pts while at work in [place] at [place].  Pt is now wearing elbow extension brace at night and had 1 PT evaluation (scheduled by mistake as pt required to have therapy at [facility] due to Workers Comp)   Limitations light duty at work   Repetition Increases Symptoms   Patient Stated Goals use of arm/elbow without pain   Currently in Pain? Yes   Pain Score --  0-4/10 last 2 days, 2-3/10 today   Pain Location Arm   Pain Orientation Right;Medial;Lateral   Aggravating Factors  use   Pain Relieving Factors voltaren gel, repositioning                    OT Treatments/Exercises (OP) - 12/16/14 0001    Elbow Exercises   Other elbow exercises lateral and medial epicondylitis exercises #1 and 2 (AROM wrist flex/ext with elbow bent and forearm in neutral, with elbow bent supination/wrist ext AROM, and elbow bent pronation wrist flex AROM)   Ultrasound   Ultrasound Location right lateral and medial elbow/forearm   Ultrasound Parameters 97mhz, 0.8 wt/cm2, continuous, x8 min to lateral and x8 min to medial elbow/forearm   Ultrasound Goals Edema;Pain   Splinting   Splinting Checked pt's nighttime elbow ext splint that MD issued to her.  It appears to be fitting well                OT Education - 12/16/14 1501    Education Details Added AROM #3 for lateral and medical epicondylitis, reviewed increasing wrist splint wear during the day/neutral wrist for activities.   Person(s) Educated Patient   Methods Explanation;Demonstration;Verbal cues;Handout   Comprehension Verbalized understanding;Returned demonstration          OT Short Term Goals - 12/02/14 1415    OT SHORT TERM GOAL #1   Title Pt will be independent with stretching HEP.--STGs due 01/02/15   Time 4   Period Weeks   Status New   OT SHORT TERM GOAL #2   Title Pt will verbalize understanding of activity modifcations to decrease  pain prn including splint wear/care.   Time 4   Period Weeks   Status New   OT SHORT TERM GOAL #3   Title Pt will report pain less than or equal to 3/10 consistently for light activity.   Time 4   Period Weeks   Status New   OT SHORT TERM GOAL #4   Title Pt improve grip strength to at least 60lbs for gripping/lifting tasks.   Baseline 48-52 with 3-7/10 pain (increased with elbow extended)   Time 4   Period Weeks   Status New           OT Long Term Goals - 12/02/14 1416    OT LONG TERM GOAL #1   Title Pt will be independent with strengthening HEP when appropriate.--LTGs due 02/01/15   Time 8   Period Weeks   Status New   OT LONG TERM GOAL #2   Title Pt improve grip strength to at least 75lbs for  gripping/lifting tasks.   Baseline 48-52 with 3-7/10 pain (increased with elbow extended)   Time 8   Period Weeks   Status New   OT LONG TERM GOAL #3   Title Pt will be able to retrieve 5lb object from overhead shelf with pain less than 1/10 for ADLs and lifting tasks.   Time 8   Period Weeks   Status New   OT LONG TERM GOAL #4   Title Pt will be able to lift/carry 35lbs with both UEs with less than 1/10 pain in prep for work activities.   Time 8   Period Weeks   Status New   OT LONG TERM GOAL #5   Title Pt will improve dominant RUE functional use as shown by rating Quick DASH at 15% disability or less.   Baseline 34.09%   Time 8   Period Weeks   Status New               Plan - 12/16/14 1509    Clinical Impression Statement Pt progressing towards goals, progressed HEP.  Awaiting Iontophoresis order which was faxed to MD 12/11/14.  Pt not scheduled 2nd time this week due to going out of town for [place].   Plan modalities, progress HEP as able, Ionto if order returned.   Consulted and Agree with Plan of Care Patient        Problem List There are no active problems to display for this patient.   [facility] 12/16/2014, 3:13 PM  [facility] [address] [place], [place], [address] Phone: [phone]   Fax:  [place], OTR/L 12/16/2014 3:13 PM

## 2014-12-16 NOTE — Patient Instructions (Signed)
With elbow extended and thumb facing up, bring wrist in toward your body, hold 10-15sec, then extend wrist away from body holding 10-15 sec.  Do 10 reps, 3 times/day.

## 2014-12-25 ENCOUNTER — Ambulatory Visit: Payer: PRIVATE HEALTH INSURANCE | Attending: Orthopedic Surgery | Admitting: Occupational Therapy

## 2014-12-25 DIAGNOSIS — M25521 Pain in right elbow: Secondary | ICD-10-CM | POA: Diagnosis not present

## 2014-12-25 DIAGNOSIS — R29898 Other symptoms and signs involving the musculoskeletal system: Secondary | ICD-10-CM | POA: Insufficient documentation

## 2014-12-25 NOTE — Therapy (Signed)
[facility] [address] [place], [place], [address] Phone: [phone]   Fax:  [phone]  Occupational Therapy Treatment  Patient Details  Name: Monica Petty MRN: [id number] Date of Birth: 27-Nov-1965 Referring Provider:  Roseanne Kaufman, MD  Encounter Date: 12/25/2014      OT End of Session - 12/25/14 1447    Visit Number 4   Number of Visits 17   Date for OT Re-Evaluation 02/01/15   Authorization Type Workers Comp approved 12 visits    Authorization - Visit Number 4   Authorization - Number of Visits 12   OT Start Time 1410   OT Stop Time 1452   OT Time Calculation (min) 42 min   Activity Tolerance Patient tolerated treatment well   Behavior During Therapy [facility] for tasks assessed/performed      No past medical history on file.  Past Surgical History  Procedure Laterality Date  . Abdominal hysterectomy    . Breast surgery      reduction  . Wisdom tooth extraction      There were no vitals filed for this visit.  Visit Diagnosis:  Pain, elbow joint, right  Weakness of right arm      Subjective Assessment - 12/25/14 1410    Subjective  It felt sore doing the exercises this morning since I didn't do them yesterday since I was traveling the whole time   Pertinent History Pt injured 09/08/14 and 09/23/14 moving large pts while at work in [place] at [place].  Pt is now wearing elbow extension brace at night and had 1 PT evaluation (scheduled by mistake as pt required to have therapy at [facility] due to Workers Comp)   Limitations light duty at work   Repetition Increases Symptoms   Patient Stated Goals use of arm/elbow without pain   Currently in Pain? Yes   Pain Score --  1-5/10   Pain Location Arm   Pain Orientation Right;Left;Medial;Lateral   Pain Descriptors / Indicators Sore   Pain Type Acute pain   Aggravating Factors  use, elbow extension   Pain Relieving Factors repositioning, boltaren gel                     OT Treatments/Exercises (OP) - 12/25/14 0001    Modalities   Modalities Iontophoresis   Ultrasound   Ultrasound Location right lateral elbow/forearm    Ultrasound Parameters 29mhz, 1.0 wts/cm2, continuous x67min with no adverse reactions   Ultrasound Goals Edema;Pain   Iontophoresis   Type of Iontophoresis Dexamethasone   Location R medial elbow/forearm   Dose 2.0 cc, intensity=1.7-2.0 with no adverse reactions   Time x26min due to pt tolerance   Manual Therapy   Manual Therapy Massage   Massage to medial elbow forearm due to tightness/pain                OT Education - 12/25/14 1439    Education Details Reviewed AROM #3 ex for lateral and medial epicondylitis   Person(s) Educated Patient   Methods Explanation;Demonstration   Comprehension Verbalized understanding          OT Short Term Goals - 12/02/14 1415    OT SHORT TERM GOAL #1   Title Pt will be independent with stretching HEP.--STGs due 01/02/15   Time 4   Period Weeks   Status New   OT SHORT TERM GOAL #2   Title Pt will verbalize understanding of activity modifcations to decrease pain prn  including splint wear/care.   Time 4   Period Weeks   Status New   OT SHORT TERM GOAL #3   Title Pt will report pain less than or equal to 3/10 consistently for light activity.   Time 4   Period Weeks   Status New   OT SHORT TERM GOAL #4   Title Pt improve grip strength to at least 60lbs for gripping/lifting tasks.   Baseline 48-52 with 3-7/10 pain (increased with elbow extended)   Time 4   Period Weeks   Status New           OT Long Term Goals - 12/02/14 1416    OT LONG TERM GOAL #1   Title Pt will be independent with strengthening HEP when appropriate.--LTGs due 02/01/15   Time 8   Period Weeks   Status New   OT LONG TERM GOAL #2   Title Pt improve grip strength to at least 75lbs for gripping/lifting tasks.   Baseline 48-52 with 3-7/10 pain (increased with elbow  extended)   Time 8   Period Weeks   Status New   OT LONG TERM GOAL #3   Title Pt will be able to retrieve 5lb object from overhead shelf with pain less than 1/10 for ADLs and lifting tasks.   Time 8   Period Weeks   Status New   OT LONG TERM GOAL #4   Title Pt will be able to lift/carry 35lbs with both UEs with less than 1/10 pain in prep for work activities.   Time 8   Period Weeks   Status New   OT LONG TERM GOAL #5   Title Pt will improve dominant RUE functional use as shown by rating Quick DASH at 15% disability or less.   Baseline 34.09%   Time 8   Period Weeks   Status New               Plan - 12/25/14 1448    Clinical Impression Statement Pt progressing towards goals and tolerated initiation of Iontophoresis today.   Plan Ionto, progress HEP as able   Consulted and Agree with Plan of Care Patient        Problem List There are no active problems to display for this patient.   [facility] 12/25/2014, 2:54 PM  [facility] [address] [place], [place], [address] Phone: [phone]   Fax:  [place], OTR/L 12/25/2014 2:54 PM

## 2014-12-29 ENCOUNTER — Encounter: Payer: Self-pay | Admitting: Occupational Therapy

## 2014-12-29 ENCOUNTER — Ambulatory Visit: Payer: PRIVATE HEALTH INSURANCE | Admitting: Occupational Therapy

## 2014-12-29 DIAGNOSIS — M25521 Pain in right elbow: Secondary | ICD-10-CM

## 2014-12-29 NOTE — Therapy (Signed)
[facility] [address] [place], [place], [address] Phone: [phone]   Fax:  [phone]  Occupational Therapy Treatment  Patient Details  Name: Monica Petty MRN: [id number] Date of Birth: [birth year] Referring Provider:  Roseanne Kaufman, MD  Encounter Date: 12/29/2014      OT End of Session - 12/29/14 1529    Visit Number 5   Number of Visits 17   Date for OT Re-Evaluation 02/01/15   Authorization Type Workers Comp approved 12 visits    Authorization - Visit Number 5   Authorization - Number of Visits 12   OT Start Time 1450   OT Stop Time 1537   OT Time Calculation (min) 47 min   Activity Tolerance Patient tolerated treatment well   Behavior During Therapy [facility] for tasks assessed/performed      History reviewed. No pertinent past medical history.  Past Surgical History  Procedure Laterality Date  . Abdominal hysterectomy    . Breast surgery      reduction  . Wisdom tooth extraction      There were no vitals filed for this visit.  Visit Diagnosis:  Pain, elbow joint, right      Subjective Assessment - 12/29/14 1519    Subjective  "It feels about the same"  Pt reports that PA wants her to be on light duty and have therapy for 2 more months.   Pertinent History Pt injured 09/08/14 and 09/23/14 moving large pts while at work in [place] at [place].  Pt is now wearing elbow extension brace at night and had 1 PT evaluation (scheduled by mistake as pt required to have therapy at [facility] due to Workers Comp)   Limitations light duty at work   Repetition Increases Symptoms   Patient Stated Goals use of arm/elbow without pain   Currently in Pain? Yes   Pain Location Arm  0-4/10   Pain Orientation Right;Medial;Lateral   Pain Type Acute pain   Aggravating Factors  use, elbow extension   Pain Relieving Factors repositioning, voltaren gel                    OT Treatments/Exercises (OP) -  12/29/14 0001    Elbow Exercises   Other elbow exercises wrist flex/ext with elbow extended AROM stretch with forearm in neutral   Ultrasound   Ultrasound Location R medial elbow/forearm   Ultrasound Parameters 1 mhz, 1.0 wts/cm2, continuous x14min with no adverse reactions   Ultrasound Goals Edema;Pain   Iontophoresis   Type of Iontophoresis Dexamethasone   Location R medial elbow/forearm   Dose 2.0 cc, intensity=1.9-2.0  with no adverse reactions   Time x2min wih no adverse reactions, mild pink discoloration under electrode site   Manual Therapy   Manual Therapy Massage;Myofascial release   Massage to medial elbow forearm due to tightness/pain   Myofascial Release to medial elbow/forearm                  OT Short Term Goals - 12/02/14 1415    OT SHORT TERM GOAL #1   Title Pt will be independent with stretching HEP.--STGs due 01/02/15   Time 4   Period Weeks   Status New   OT SHORT TERM GOAL #2   Title Pt will verbalize understanding of activity modifcations to decrease pain prn including splint wear/care.   Time 4   Period Weeks   Status New   OT SHORT TERM GOAL #3   Title  Pt will report pain less than or equal to 3/10 consistently for light activity.   Time 4   Period Weeks   Status New   OT SHORT TERM GOAL #4   Title Pt improve grip strength to at least 60lbs for gripping/lifting tasks.   Baseline 48-52 with 3-7/10 pain (increased with elbow extended)   Time 4   Period Weeks   Status New           OT Long Term Goals - 12/02/14 1416    OT LONG TERM GOAL #1   Title Pt will be independent with strengthening HEP when appropriate.--LTGs due 02/01/15   Time 8   Period Weeks   Status New   OT LONG TERM GOAL #2   Title Pt improve grip strength to at least 75lbs for gripping/lifting tasks.   Baseline 48-52 with 3-7/10 pain (increased with elbow extended)   Time 8   Period Weeks   Status New   OT LONG TERM GOAL #3   Title Pt will be able to retrieve 5lb  object from overhead shelf with pain less than 1/10 for ADLs and lifting tasks.   Time 8   Period Weeks   Status New   OT LONG TERM GOAL #4   Title Pt will be able to lift/carry 35lbs with both UEs with less than 1/10 pain in prep for work activities.   Time 8   Period Weeks   Status New   OT LONG TERM GOAL #5   Title Pt will improve dominant RUE functional use as shown by rating Quick DASH at 15% disability or less.   Baseline 34.09%   Time 8   Period Weeks   Status New               Plan - 12/29/14 1533    Clinical Impression Statement Ionto #2 today.  Pt reports less soreness with HEP today.   Plan Ionto, progress HEP to lateral/medial epicondylitis AROM stretch #4   OT Home Exercise Plan 12/10/14- therapist reviewed previously issue HEP.   Consulted and Agree with Plan of Care Patient        Problem List There are no active problems to display for this patient.   [facility] 12/29/2014, 3:42 PM  [facility] [address] [place], [place], [address] Phone: [phone]   Fax:  [place], OTR/L 12/29/2014 3:42 PM

## 2014-12-31 ENCOUNTER — Ambulatory Visit: Payer: PRIVATE HEALTH INSURANCE | Attending: Orthopedic Surgery | Admitting: Occupational Therapy

## 2014-12-31 DIAGNOSIS — R29898 Other symptoms and signs involving the musculoskeletal system: Secondary | ICD-10-CM | POA: Insufficient documentation

## 2014-12-31 DIAGNOSIS — M25521 Pain in right elbow: Secondary | ICD-10-CM | POA: Diagnosis not present

## 2014-12-31 NOTE — Therapy (Signed)
[facility] [address] [place], [place], [address] Phone: [phone]   Fax:  [phone]  Occupational Therapy Treatment  Patient Details  Name: Monica Petty MRN: [id number] Date of Birth: 05/26/1966 Referring Provider:  Roseanne Kaufman, MD  Encounter Date: 12/31/2014      OT End of Session - 12/31/14 1540    Visit Number 6   Number of Visits 17   Date for OT Re-Evaluation 02/01/15   Authorization Type Workers Comp approved 12 visits    Authorization - Visit Number 6   Authorization - Number of Visits 12   OT Start Time 1536   OT Stop Time 1627   OT Time Calculation (min) 51 min   Activity Tolerance Patient tolerated treatment well   Behavior During Therapy [facility] for tasks assessed/performed      No past medical history on file.  Past Surgical History  Procedure Laterality Date  . Abdominal hysterectomy    . Breast surgery      reduction  . Wisdom tooth extraction      There were no vitals filed for this visit.  Visit Diagnosis:  Pain, elbow joint, right  Weakness of right arm      Subjective Assessment - 12/31/14 1538    Pertinent History Pt injured 09/08/14 and 09/23/14 moving large pts while at work in [place] at [place].  Pt is now wearing elbow extension brace at night and had 1 PT evaluation (scheduled by mistake as pt required to have therapy at [facility] due to Workers Comp)   Limitations light duty at work   Repetition Increases Symptoms   Patient Stated Goals use of arm/elbow without pain   Currently in Pain? Yes   Pain Score 3    Pain Location Arm   Pain Orientation Right;Left;Lateral   Pain Descriptors / Indicators Sore   Pain Type Acute pain   Pain Onset More than a month ago   Aggravating Factors  use, elbow extension   Pain Relieving Factors reposistioning,voltaren gel                    OT Treatments/Exercises (OP) - 12/31/14 0001    Elbow Exercises   Other  elbow exercises wrist flex/ext with elbow extended AROM stretch with forearm in neutral   Other elbow exercises wrist flexion/ extension stretch #4 with palm down, elbow extended   Modalities   Modalities Iontophoresis   Ultrasound   Ultrasound Location right lateral forearm/ elbow   Ultrasound Parameters 1 mhz, 1.0 w/cm 2 continuous x 8 mins   Ultrasound Goals Edema;Pain   Iontophoresis   Type of Iontophoresis Dexamethasone   Location R medial elbow/forearm   Dose 2.0 cc, intensity=1.9-2.0  with no adverse reactions   Time x40min wih no adverse reactions, mild pink discoloration under electrode site   Manual Therapy   Massage to medial/ lateral elbow/ forearm due to tightness/pain   Myofascial Release to medial/lateral elbow forearm, due to tightness and pain                  OT Short Term Goals - 12/02/14 1415    OT SHORT TERM GOAL #1   Title Pt will be independent with stretching HEP.--STGs due 01/02/15   Time 4   Period Weeks   Status New   OT SHORT TERM GOAL #2   Title Pt will verbalize understanding of activity modifcations to decrease pain prn including splint wear/care.   Time 4  Period Weeks   Status New   OT SHORT TERM GOAL #3   Title Pt will report pain less than or equal to 3/10 consistently for light activity.   Time 4   Period Weeks   Status New   OT SHORT TERM GOAL #4   Title Pt improve grip strength to at least 60lbs for gripping/lifting tasks.   Baseline 48-52 with 3-7/10 pain (increased with elbow extended)   Time 4   Period Weeks   Status New           OT Long Term Goals - 12/02/14 1416    OT LONG TERM GOAL #1   Title Pt will be independent with strengthening HEP when appropriate.--LTGs due 02/01/15   Time 8   Period Weeks   Status New   OT LONG TERM GOAL #2   Title Pt improve grip strength to at least 75lbs for gripping/lifting tasks.   Baseline 48-52 with 3-7/10 pain (increased with elbow extended)   Time 8   Period Weeks    Status New   OT LONG TERM GOAL #3   Title Pt will be able to retrieve 5lb object from overhead shelf with pain less than 1/10 for ADLs and lifting tasks.   Time 8   Period Weeks   Status New   OT LONG TERM GOAL #4   Title Pt will be able to lift/carry 35lbs with both UEs with less than 1/10 pain in prep for work activities.   Time 8   Period Weeks   Status New   OT LONG TERM GOAL #5   Title Pt will improve dominant RUE functional use as shown by rating Quick DASH at 15% disability or less.   Baseline 34.09%   Time 8   Period Weeks   Status New               Plan - 12/31/14 1616    Clinical Impression Statement Ionto #3 today. Pt reports decreased overall pain since ionto treatment.   Plan Continue ionto, reinforce A/ROM stretch #4, check STG's   OT Home Exercise Plan 12/10/14- therapist reviewed previously issue HEP.   Consulted and Agree with Plan of Care Patient        Problem List There are no active problems to display for this patient.   RINE,KATHRYN 12/31/2014, 4:28 PM Theone Murdoch, OTR/L [phone] Phone: [phone] 4:28 PM 12/31/2014 [facility] [address] [place], [place], [address] Phone: [phone]   Fax:  [phone]

## 2015-01-07 ENCOUNTER — Ambulatory Visit: Payer: PRIVATE HEALTH INSURANCE | Admitting: Occupational Therapy

## 2015-01-07 DIAGNOSIS — M25521 Pain in right elbow: Secondary | ICD-10-CM

## 2015-01-07 DIAGNOSIS — R29898 Other symptoms and signs involving the musculoskeletal system: Secondary | ICD-10-CM

## 2015-01-08 NOTE — Therapy (Signed)
[facility] [address] [place], [place], [address] Phone: [phone]   Fax:  [phone]  Occupational Therapy Treatment  Patient Details  Name: Monica Petty MRN: [id number] Date of Birth: 1966-08-10 Referring Provider:  Roseanne Kaufman, MD  Encounter Date: 01/07/2015      OT End of Session - 01/07/15 1519    Visit Number 7   Number of Visits 17   Date for OT Re-Evaluation 02/01/15   Authorization Type Workers Comp approved 12 visits    Authorization - Visit Number 7   Authorization - Number of Visits 12   OT Start Time 1451   OT Stop Time 1540   OT Time Calculation (min) 49 min   Activity Tolerance Patient tolerated treatment well   Behavior During Therapy [facility] for tasks assessed/performed      No past medical history on file.  Past Surgical History  Procedure Laterality Date  . Abdominal hysterectomy    . Breast surgery      reduction  . Wisdom tooth extraction      There were no vitals filed for this visit.  Visit Diagnosis:  Pain, elbow joint, right  Weakness of right arm      Subjective Assessment - 01/07/15 1449    Subjective  Pt reports using spray bottle and aggravating medial elbow, pt sees Dr. Amedeo Plenty June 15 th   Pertinent History Pt injured 09/08/14 and 09/23/14 moving large pts while at work in [place] at [facility] hospital.  Pt is now wearing elbow extension brace at night and had 1 PT evaluation (scheduled by mistake as pt required to have therapy at [facility] due to Workers Comp)   Limitations light duty at work   Repetition Increases Symptoms   Patient Stated Goals use of arm/elbow without pain   Currently in Pain? Yes   Pain Score 4    Pain Location Arm   Pain Orientation Right;Left   Pain Descriptors / Indicators Aching;Burning   Pain Type Acute pain   Pain Onset More than a month ago   Aggravating Factors  using spray bottle aggravated   Pain Relieving Factors voltaren   Effect of  Pain on Daily Activities on light                       OT Treatments/Exercises (OP) - 01/08/15 0001    Modalities   Modalities Cryotherapy;Iontophoresis;Ultrasound   Cryotherapy   Number Minutes Cryotherapy 8 Minutes   Cryotherapy Location Forearm  elbow   Type of Cryotherapy Ice pack  Pt reported decreased pain, applied befor ionto   Ultrasound   Ultrasound Location right lateral elbow/ forearm   Ultrasound Parameters 40mHZ, 1.0 w/cm2,continuous x 8 mins   Ultrasound Goals Edema;Pain   Iontophoresis   Type of Iontophoresis Dexamethasone   Location R medial elbow/forearm   Dose 2.0 cc, intensity=1.9-2.0  with no adverse reactions   Time x74min with no adverse reactions,1 small water blister present,  mild pink discoloration under electrode site   Manual Therapy   Manual Therapy Massage   Massage to medial and lateral elbow forearm due to tightness/pain                  OT Short Term Goals - 01/07/15 1526    OT SHORT TERM GOAL #1   Title Pt will be independent with stretching HEP.--STGs due 01/02/15   Time 4   Period Weeks   Status Achieved   OT  SHORT TERM GOAL #2   Title Pt will verbalize understanding of activity modifcations to decrease pain prn including splint wear/care.   Time 4   Period Weeks   Status Achieved   OT SHORT TERM GOAL #3   Title Pt will report pain less than or equal to 3/10 consistently for light activity.   Baseline pain 4/10 following srapy bottle   Time 4   Period Weeks   Status On-going   OT SHORT TERM GOAL #4   Title Pt improve grip strength to at least 60lbs for gripping/lifting tasks.   Baseline 48-52 with 3-7/10 pain (increased with elbow extended)   Time 4   Period Weeks   Status On-going           OT Long Term Goals - 12/02/14 1416    OT LONG TERM GOAL #1   Title Pt will be independent with strengthening HEP when appropriate.--LTGs due 02/01/15   Time 8   Period Weeks   Status New   OT LONG TERM GOAL #2    Title Pt improve grip strength to at least 75lbs for gripping/lifting tasks.   Baseline 48-52 with 3-7/10 pain (increased with elbow extended)   Time 8   Period Weeks   Status New   OT LONG TERM GOAL #3   Title Pt will be able to retrieve 5lb object from overhead shelf with pain less than 1/10 for ADLs and lifting tasks.   Time 8   Period Weeks   Status New   OT LONG TERM GOAL #4   Title Pt will be able to lift/carry 35lbs with both UEs with less than 1/10 pain in prep for work activities.   Time 8   Period Weeks   Status New   OT LONG TERM GOAL #5   Title Pt will improve dominant RUE functional use as shown by rating Quick DASH at 15% disability or less.   Baseline 34.09%   Time 8   Period Weeks   Status New               Plan - 01/07/15 1520    Clinical Impression Statement Ionto #4 today, Pt reports ionto appears to be helping.Pt reports aggravating arm mistakenly using a spray bottle to clean shower.   Plan continue ionto , progress HEP if pain is better. finish checking STG's   Consulted and Agree with Plan of Care Patient        Problem List There are no active problems to display for this patient.   [facility] 01/08/2015, 8:41 AM Theone Murdoch, OTR/L [phone] Phone: [phone] 8:41 AM 04/21/2016[facility] [address] [place], [place], [address] Phone: [phone]   Fax:  [phone]

## 2015-01-09 ENCOUNTER — Ambulatory Visit: Payer: PRIVATE HEALTH INSURANCE | Admitting: Occupational Therapy

## 2015-01-09 DIAGNOSIS — M25521 Pain in right elbow: Secondary | ICD-10-CM | POA: Diagnosis not present

## 2015-01-09 DIAGNOSIS — R29898 Other symptoms and signs involving the musculoskeletal system: Secondary | ICD-10-CM

## 2015-01-09 NOTE — Therapy (Signed)
[facility] [address] [place], [place], [address] Phone: [phone]   Fax:  [phone]  Occupational Therapy Treatment  Patient Details  Name: Monica Petty MRN: [id number] Date of Birth: 07/19/1966 Referring Provider:  Roseanne Kaufman, MD  Encounter Date: 01/09/2015    No past medical history on file.  Past Surgical History  Procedure Laterality Date  . Abdominal hysterectomy    . Breast surgery      reduction  . Wisdom tooth extraction      There were no vitals filed for this visit.  Visit Diagnosis:  Pain, elbow joint, right  Weakness of right arm      Subjective Assessment - 01/09/15 1503    Pertinent History Pt injured 09/08/14 and 09/23/14 moving large pts while at work in [place] at [place].  Pt is now wearing elbow extension brace at night and had 1 PT evaluation (scheduled by mistake as pt required to have therapy at [facility] due to Workers Comp)   Limitations light duty at work   Patient Stated Goals use of arm/elbow without pain   Currently in Pain? No/denies   Pain Score 2    Pain Location Arm   Pain Orientation Right;Lateral;Medial   Pain Descriptors / Indicators Aching;Burning   Pain Type Acute pain   Pain Onset More than a month ago   Aggravating Factors  oversuse   Pain Relieving Factors voltaren, rest   Effect of Pain on Daily Activities on light duty   Multiple Pain Sites No                      OT Treatments/Exercises (OP) - 01/09/15 0001    Elbow Exercises   Other elbow exercises Exercise #4 phase 2 exercises, 10-15 reps, palm down   Other elbow exercises phase II exercise #1, passive stretching of wrist with LUE, x 15 reps.( Pt attempted exercise #2 stretching yet she experienced pain and so it was discontinued)   Modalities   Modalities Cryotherapy;Iontophoresis;Ultrasound   Cryotherapy   Number Minutes Cryotherapy 5 Minutes   Cryotherapy Location Forearm   elbow   Type of Cryotherapy Ice massage   Ultrasound   Ultrasound Location right lateral elbow   Ultrasound Parameters 46mHZ 1.0 w/cm 2, continuous x 8 mins.   Iontophoresis   Type of Iontophoresis Dexamethasone   Location R medial elbow/forearm   Dose 2.0 cc, intensity=1.9-2.0  with no adverse reactions   Time x20 mins                OT Education - 01/09/15 1523    Education provided Yes   Education Details Reviewed exercise  Phase 2 exercise:#4 A/ROM, followed by stretching exercisephase III exercise : #1   Person(s) Educated Patient   Methods Explanation;Demonstration   Comprehension Verbalized understanding;Returned demonstration          OT Short Term Goals - 01/09/15 1528    OT SHORT TERM GOAL #1   Title Pt will be independent with stretching HEP.--STGs due 01/02/15   Time 4   Period Weeks   Status Achieved   OT SHORT TERM GOAL #2   Title Pt will verbalize understanding of activity modifcations to decrease pain prn including splint wear/care.   Time 4   Period Weeks   Status Achieved   OT SHORT TERM GOAL #3   Title Pt will report pain less than or equal to 3/10 consistently for light activity.   Baseline pain  4/10 following spray bottle use   Time 4   Period Weeks   Status On-going   OT SHORT TERM GOAL #4   Title Pt improve grip strength to at least 60lbs for gripping/lifting tasks.   Baseline 48-52 with 3-7/10 pain (increased with elbow extended)   Time 4   Period Weeks   Status On-going           OT Long Term Goals - 12/02/14 1416    OT LONG TERM GOAL #1   Title Pt will be independent with strengthening HEP when appropriate.--LTGs due 02/01/15   Time 8   Period Weeks   Status New   OT LONG TERM GOAL #2   Title Pt improve grip strength to at least 75lbs for gripping/lifting tasks.   Baseline 48-52 with 3-7/10 pain (increased with elbow extended)   Time 8   Period Weeks   Status New   OT LONG TERM GOAL #3   Title Pt will be able to  retrieve 5lb object from overhead shelf with pain less than 1/10 for ADLs and lifting tasks.   Time 8   Period Weeks   Status New   OT LONG TERM GOAL #4   Title Pt will be able to lift/carry 35lbs with both UEs with less than 1/10 pain in prep for work activities.   Time 8   Period Weeks   Status New   OT LONG TERM GOAL #5   Title Pt will improve dominant RUE functional use as shown by rating Quick DASH at 15% disability or less.   Baseline 34.09%   Time 8   Period Weeks   Status New               Plan - 01/09/15 1525    Clinical Impression Statement Pt reports resting her arm and pain is better today, Ionto treatment #5.   Pt will benefit from skilled therapeutic intervention in order to improve on the following deficits (Retired) Pain;Impaired UE functional use;Decreased knowledge of use of DME;Decreased strength;Decreased activity tolerance;Increased edema   Rehab Potential Good   Clinical Impairments Affecting Rehab Potential none   OT Frequency 2x / week   OT Duration 8 weeks   Plan continue ionto, reinforce exercise # 1 phase III, progress to exercise 2 if pain is still down.   OT Home Exercise Plan all phase II exercises issued, and exercise 1 from phase III.   Consulted and Agree with Plan of Care Patient        Problem List There are no active problems to display for this patient.   RINE,KATHRYN 01/09/2015, 3:34 PM Theone Murdoch, OTR/L [phone] Phone: [phone] 3:34 PM 01/09/2015 [facility] [address] [place], [place], [address] Phone: [phone]   Fax:  [phone]

## 2015-01-12 ENCOUNTER — Encounter: Payer: Self-pay | Admitting: Occupational Therapy

## 2015-01-12 ENCOUNTER — Ambulatory Visit: Payer: PRIVATE HEALTH INSURANCE | Admitting: Occupational Therapy

## 2015-01-12 DIAGNOSIS — M25521 Pain in right elbow: Secondary | ICD-10-CM | POA: Diagnosis not present

## 2015-01-12 DIAGNOSIS — R29898 Other symptoms and signs involving the musculoskeletal system: Secondary | ICD-10-CM

## 2015-01-12 NOTE — Therapy (Signed)
[facility] [address] [place], [place], [address] Phone: [phone]   Fax:  [phone]  Occupational Therapy Treatment  Patient Details  Name: Monica Petty MRN: [id number] Date of Birth: 07/22/1966 Referring Provider:  Roseanne Kaufman, MD  Encounter Date: 01/12/2015      OT End of Session - 01/12/15 1503    Visit Number 9   Number of Visits 17   Date for OT Re-Evaluation 02/01/15   Authorization Type Workers Comp approved 12 visits    Authorization - Visit Number 9   Authorization - Number of Visits 12   OT Start Time 1448   OT Stop Time 1537   OT Time Calculation (min) 49 min   Activity Tolerance Patient tolerated treatment well   Behavior During Therapy [facility] for tasks assessed/performed      History reviewed. No pertinent past medical history.  Past Surgical History  Procedure Laterality Date  . Abdominal hysterectomy    . Breast surgery      reduction  . Wisdom tooth extraction      There were no vitals filed for this visit.  Visit Diagnosis:  Pain, elbow joint, right  Weakness of right arm      Subjective Assessment - 01/12/15 1458    Subjective  next MD appt 6/15, pt reports that pain is better this week   Pertinent History Pt injured 09/08/14 and 09/23/14 moving large pts while at work in [place] at [place].  Pt is now wearing elbow extension brace at night and had 1 PT evaluation (scheduled by mistake as pt required to have therapy at [facility] due to Workers Comp)   Limitations light duty at work   Repetition Increases Symptoms   Patient Stated Goals use of arm/elbow without pain   Currently in Pain? No/denies   Pain Score 0-No pain  2-3/10 the last 2 days   Pain Location Arm   Pain Orientation Right;Lateral;Medial   Pain Descriptors / Indicators Aching;Burning   Aggravating Factors  overuse   Pain Relieving Factors voltaren, rest                      OT  Treatments/Exercises (OP) - 01/12/15 0001    Elbow Exercises   Other elbow exercises AROM wrist flex/ext with elbow bent (neutral forearm) and extended, AROM wrist flex with elbow ext (forearm in pronation), then AROM wrist ext with elbow extended (forearm in supination):  all x10 with 10sec hold   Other elbow exercises PROM wrist flex with elbow extended in pronation x10 with 10sec hold   Iontophoresis   Type of Iontophoresis Dexamethasone   Location R medial elbow/forearm   Dose 2.0 cc, intensity=1.9-2.0  with no adverse reactions, Ionto #6   Time x70min    Manual Therapy   Manual Therapy Massage   Massage to medial elbow/forearm due to tightness/pain                OT Education - 01/12/15 1530    Education Details added AROM wrist ext with elbow extension with forearm supinated    Person(s) Educated Patient   Methods Explanation;Demonstration;Verbal cues;Handout   Comprehension Verbalized understanding;Returned demonstration          OT Short Term Goals - 01/12/15 1541    OT SHORT TERM GOAL #1   Title Pt will be independent with stretching HEP.--STGs due 01/02/15   Time 4   Period Weeks   Status Achieved   OT  SHORT TERM GOAL #2   Title Pt will verbalize understanding of activity modifcations to decrease pain prn including splint wear/care.   Time 4   Period Weeks   Status Achieved   OT SHORT TERM GOAL #3   Title Pt will report pain less than or equal to 3/10 consistently for light activity.   Baseline pain 4/10 following spray bottle use   Time 4   Period Weeks   Status On-going   OT SHORT TERM GOAL #4   Title Pt improve grip strength to at least 60lbs for gripping/lifting tasks.   Baseline 48-52 with 3-7/10 pain (increased with elbow extended)   Time 4   Period Weeks   Status On-going           OT Long Term Goals - 12/02/14 1416    OT LONG TERM GOAL #1   Title Pt will be independent with strengthening HEP when appropriate.--LTGs due 02/01/15   Time 8    Period Weeks   Status New   OT LONG TERM GOAL #2   Title Pt improve grip strength to at least 75lbs for gripping/lifting tasks.   Baseline 48-52 with 3-7/10 pain (increased with elbow extended)   Time 8   Period Weeks   Status New   OT LONG TERM GOAL #3   Title Pt will be able to retrieve 5lb object from overhead shelf with pain less than 1/10 for ADLs and lifting tasks.   Time 8   Period Weeks   Status New   OT LONG TERM GOAL #4   Title Pt will be able to lift/carry 35lbs with both UEs with less than 1/10 pain in prep for work activities.   Time 8   Period Weeks   Status New   OT LONG TERM GOAL #5   Title Pt will improve dominant RUE functional use as shown by rating Quick DASH at 15% disability or less.   Baseline 34.09%   Time 8   Period Weeks   Status New               Plan - 01/12/15 1537    Clinical Impression Statement Pt reports pain improved overall and is progressing well with 1 flare-up slowing progress temporarily.  Completed Ionto treatment #5 today.  Pt will benefit from continued occupational therapy to slowly progress HEP/activity and address pain.   Pt will benefit from skilled therapeutic intervention in order to improve on the following deficits (Retired) Pain;Impaired UE functional use;Decreased knowledge of use of DME;Decreased strength;Decreased activity tolerance;Increased edema   Rehab Potential Good   Clinical Impairments Affecting Rehab Potential none   OT Frequency 2x / week   OT Duration 8 weeks   OT Treatment/Interventions Self-care/ADL training;Moist Heat;Fluidtherapy;DME and/or AE instruction;Splinting;Patient/family education;Therapeutic exercises;Contrast Bath;Ultrasound;Therapeutic exercise;Therapeutic activities;Passive range of motion;Iontophoresis;Cryotherapy;Electrical Stimulation;Manual Therapy;Neuromuscular education   Plan attempt PROM wrist ext with supination and elbow extended as able with pain tolerance, check to see if  additional visits approved from Workers Comp (requested 01/12/15)   OT Home Exercise Plan --   Consulted and Agree with Plan of Care Patient        Problem List There are no active problems to display for this patient.   [facility] 01/12/2015, 3:54 PM  [facility] [address] [place], [place], [address] Phone: [phone]   Fax:  [place], OTR/L 01/12/2015 3:54 PM

## 2015-01-12 NOTE — Patient Instructions (Addendum)
Extend elbow, turn palm up and extend wrist 10 times, hold 10 sec. 3x/day

## 2015-01-14 ENCOUNTER — Ambulatory Visit: Payer: PRIVATE HEALTH INSURANCE | Attending: Orthopedic Surgery | Admitting: Occupational Therapy

## 2015-01-14 DIAGNOSIS — M25521 Pain in right elbow: Secondary | ICD-10-CM | POA: Diagnosis present

## 2015-01-14 DIAGNOSIS — R29898 Other symptoms and signs involving the musculoskeletal system: Secondary | ICD-10-CM | POA: Insufficient documentation

## 2015-01-14 NOTE — Therapy (Signed)
[facility] [address] [place], [place], [address] Phone: [phone]   Fax:  [phone]  Occupational Therapy Treatment  Patient Details  Name: Monica Petty MRN: [id number] Date of Birth: Oct 20, 1965 Referring Provider:  Roseanne Kaufman, MD  Encounter Date: 01/14/2015      OT End of Session - 01/14/15 1620    Visit Number 10   Number of Visits 17   Date for OT Re-Evaluation 02/01/15   Authorization Type Workers Comp approved 12 visits    Authorization - Visit Number 10   Authorization - Number of Visits 12   OT Start Time 1536   OT Stop Time 1627   OT Time Calculation (min) 51 min   Activity Tolerance Patient tolerated treatment well   Behavior During Therapy [facility] for tasks assessed/performed      No past medical history on file.  Past Surgical History  Procedure Laterality Date  . Abdominal hysterectomy    . Breast surgery      reduction  . Wisdom tooth extraction      There were no vitals filed for this visit.  Visit Diagnosis:  Pain, elbow joint, right  Weakness of right arm      Subjective Assessment - 01/14/15 1539    Subjective  next MD appt 6/15, pt reports that pain is better this week   Pertinent History Pt injured 09/08/14 and 09/23/14 moving large pts while at work in [place] at [place].  Pt is now wearing elbow extension brace at night and had 1 PT evaluation (scheduled by mistake as pt required to have therapy at [facility] due to Workers Comp)   Limitations light duty at work   Repetition Increases Symptoms   Patient Stated Goals use of arm/elbow without pain   Currently in Pain? Yes   Pain Score 2    Pain Location Arm   Pain Orientation Right;Lateral;Medial   Pain Descriptors / Indicators Aching;Burning   Pain Type Acute pain   Pain Onset More than a month ago   Pain Frequency Intermittent   Aggravating Factors  overuse   Pain Relieving Factors voltaren, rest   Multiple Pain  Sites No                      OT Treatments/Exercises (OP) - 01/14/15 0001    Elbow Exercises   Other elbow exercises AROM wrist flex/ext with elbow bent (neutral forearm) and extended, AROM wrist flex with elbow ext (forearm in pronation), then AROM wrist ext with elbow extended (forearm in supination):  all x10 with 10sec hold   Other elbow exercises PROM wrist flex with elbow extended in pronation x10 with 10sec hold   Iontophoresis   Type of Iontophoresis Dexamethasone   Location R medial elbow/forearm   Dose 2.0 cc, intensity=1.9-2.0  with no adverse reactions, Ionto #6   Time x67min , Pt with pink coloration and approximately 6 small water blisters present, pt was in no distress/ no significant pain.. Lotion applied and pt was instructed to monitor.    Manual Therapy   Manual Therapy Massage   Massage to medial and lateral elbow/forearm due to tightness/pain                  OT Short Term Goals - 01/12/15 1541    OT SHORT TERM GOAL #1   Title Pt will be independent with stretching HEP.--STGs due 01/02/15   Time 4   Period Weeks  Status Achieved   OT SHORT TERM GOAL #2   Title Pt will verbalize understanding of activity modifcations to decrease pain prn including splint wear/care.   Time 4   Period Weeks   Status Achieved   OT SHORT TERM GOAL #3   Title Pt will report pain less than or equal to 3/10 consistently for light activity.   Baseline pain 4/10 following spray bottle use   Time 4   Period Weeks   Status On-going   OT SHORT TERM GOAL #4   Title Pt improve grip strength to at least 60lbs for gripping/lifting tasks.   Baseline 48-52 with 3-7/10 pain (increased with elbow extended)   Time 4   Period Weeks   Status On-going           OT Long Term Goals - 12/02/14 1416    OT LONG TERM GOAL #1   Title Pt will be independent with strengthening HEP when appropriate.--LTGs due 02/01/15   Time 8   Period Weeks   Status New   OT LONG TERM  GOAL #2   Title Pt improve grip strength to at least 75lbs for gripping/lifting tasks.   Baseline 48-52 with 3-7/10 pain (increased with elbow extended)   Time 8   Period Weeks   Status New   OT LONG TERM GOAL #3   Title Pt will be able to retrieve 5lb object from overhead shelf with pain less than 1/10 for ADLs and lifting tasks.   Time 8   Period Weeks   Status New   OT LONG TERM GOAL #4   Title Pt will be able to lift/carry 35lbs with both UEs with less than 1/10 pain in prep for work activities.   Time 8   Period Weeks   Status New   OT LONG TERM GOAL #5   Title Pt will improve dominant RUE functional use as shown by rating Quick DASH at 15% disability or less.   Baseline 34.09%   Time 8   Period Weeks   Status New               Plan - 01/14/15 1618    Clinical Impression Statement Pt is progressing with overall decreased pain. Ionto treatment # 6 completed today.   Plan attempt P/ROM wrist ext with supination/ elbow ext as able due to pain tolerance, check for worker's comp approval   OT Home Exercise Plan all phase II exercises issued, and exercise 1 from phase III.   Consulted and Agree with Plan of Care Patient        Problem List There are no active problems to display for this patient.   RINE,KATHRYN 01/14/2015, 4:46 PM Theone Murdoch, OTR/L [phone] Phone: [phone] 4:46 PM 01/14/2015 [facility] [address] [place], [place], [address] Phone: [phone]   Fax:  [phone]

## 2015-01-21 ENCOUNTER — Ambulatory Visit: Payer: PRIVATE HEALTH INSURANCE | Attending: Orthopedic Surgery | Admitting: Occupational Therapy

## 2015-01-21 DIAGNOSIS — R29898 Other symptoms and signs involving the musculoskeletal system: Secondary | ICD-10-CM | POA: Diagnosis not present

## 2015-01-21 DIAGNOSIS — M25521 Pain in right elbow: Secondary | ICD-10-CM | POA: Diagnosis not present

## 2015-01-21 NOTE — Patient Instructions (Signed)
Perform x 10 reps hold 10-15 secs) With elbow flexed and forearm in neutral position actively extend the wrist  With elbow extended and forearm in neutral position actively extend the wrist  With elbow extended and forearm supinated actively extend the wrist  Passive stretching:(10-15 reps hold for 10-15 secs  With elbow flexed at 90*, and forearm in neutral position, stretch wrist in extension, stop before pain!  If no pain with first exercise, with elbow extended, and forearm neutral passively stretch into extension gently

## 2015-01-21 NOTE — Therapy (Signed)
[facility] [address] [place], [place], [address] Phone: [phone]   Fax:  [phone]  Occupational Therapy Treatment  Patient Details  Name: Monica Petty MRN: [id number] Date of Birth: [birth year] Referring Provider:  Roseanne Kaufman, MD  Encounter Date: 01/21/2015      OT End of Session - 01/21/15 1550    Visit Number 11   Number of Visits 17   Date for OT Re-Evaluation 02/01/15   Authorization Type Workers Comp approved 12 visits initally followed by an additional 6 visits approved on 01/14/15 (18 visits total)   Authorization - Visit Number 11   Authorization - Number of Visits 18   OT Start Time 4970   OT Stop Time 1538   OT Time Calculation (min) 50 min   Activity Tolerance Patient tolerated treatment well   Behavior During Therapy [facility] for tasks assessed/performed      No past medical history on file.  Past Surgical History  Procedure Laterality Date  . Abdominal hysterectomy    . Breast surgery      reduction  . Wisdom tooth extraction      There were no vitals filed for this visit.  Visit Diagnosis:  Pain, elbow joint, right  Weakness of right arm      Subjective Assessment - 01/21/15 1442    Pertinent History Pt injured 09/08/14 and 09/23/14 moving large pts while at work in [place] at [place].  Pt is now wearing elbow extension brace at night and had 1 PT evaluation (scheduled by mistake as pt required to have therapy at [facility] due to Workers Comp)   Limitations light duty at work   Repetition Increases Symptoms   Currently in Pain? Yes   Pain Score 3    Pain Location Arm   Pain Orientation Right;Medial;Lateral   Pain Type Acute pain   Pain Onset More than a month ago   Pain Frequency Intermittent   Aggravating Factors  overuse   Pain Relieving Factors voltaren, rest   Multiple Pain Sites No                      OT Treatments/Exercises (OP) - 01/21/15 0001    Elbow Exercises   Other elbow exercises AROM wrist flex/ext with elbow bent (neutral forearm) and extended, AROM wrist flex with elbow ext (forearm in pronation), then AROM wrist ext with elbow extended (forearm in supination):  all x10 with 10sec hold   Other elbow exercises PROM wrist ext with elbow flex neutral forearm  x10, followed by wrist extension P/ROM with forearm neutral x 5 reps    Cryotherapy   Number Minutes Cryotherapy 10 Minutes   Cryotherapy Location Forearm;Upper arm  Elbow   Type of Cryotherapy Ice pack, pt reported decreased pain following ice. Therapist recommends pt ice after work day.   Ultrasound   Ultrasound Location right medial elbow   Ultrasound Parameters 3MHZ, 1.0 w/cm2, continuous x 8 mins   Ultrasound Goals Edema;Pain                OT Education - 01/21/15 1548    Education provided Yes, updates to HEP to address medial epicondylitis(see pt instructions)   Person(s) Educated Patient   Methods Explanation;Demonstration;Verbal cues;Handout   Comprehension Verbalized understanding;Returned demonstration          OT Short Term Goals - 01/12/15 1541    OT SHORT TERM GOAL #1   Title Pt will be independent with  stretching HEP.--STGs due 01/02/15   Time 4   Period Weeks   Status Achieved   OT SHORT TERM GOAL #2   Title Pt will verbalize understanding of activity modifcations to decrease pain prn including splint wear/care.   Time 4   Period Weeks   Status Achieved   OT SHORT TERM GOAL #3   Title Pt will report pain less than or equal to 3/10 consistently for light activity.   Baseline pain 4/10 following spray bottle use   Time 4   Period Weeks   Status On-going   OT SHORT TERM GOAL #4   Title Pt improve grip strength to at least 60lbs for gripping/lifting tasks.   Baseline 48-52 with 3-7/10 pain (increased with elbow extended)   Time 4   Period Weeks   Status On-going           OT Long Term Goals - 12/02/14 1416    OT LONG TERM  GOAL #1   Title Pt will be independent with strengthening HEP when appropriate.--LTGs due 02/01/15   Time 8   Period Weeks   Status New   OT LONG TERM GOAL #2   Title Pt improve grip strength to at least 75lbs for gripping/lifting tasks.   Baseline 48-52 with 3-7/10 pain (increased with elbow extended)   Time 8   Period Weeks   Status New   OT LONG TERM GOAL #3   Title Pt will be able to retrieve 5lb object from overhead shelf with pain less than 1/10 for ADLs and lifting tasks.   Time 8   Period Weeks   Status New   OT LONG TERM GOAL #4   Title Pt will be able to lift/carry 35lbs with both UEs with less than 1/10 pain in prep for work activities.   Time 8   Period Weeks   Status New   OT LONG TERM GOAL #5   Title Pt will improve dominant RUE functional use as shown by rating Quick DASH at 15% disability or less.   Baseline 34.09%   Time 8   Period Weeks   Status New               Plan - 01/21/15 1546    Clinical Impression Statement Pt is progressing towards goals with decreased overall pain. Pt plans to decrease frequency to 1x week starting next week.   Plan review updated HEP, modalities   OT Home Exercise Plan all phase II exercises issued, see updated HEP from 01/21/15 to focus more on medial epicondylitis   Consulted and Agree with Plan of Care Patient        Problem List There are no active problems to display for this patient.   RINE,KATHRYN 01/21/2015, 4:08 PM Theone Murdoch, OTR/L [phone] Phone: [phone] 4:08 PM 01/21/2015 [facility] [address] [place], [place], [address] Phone: [phone]   Fax:  [phone]

## 2015-01-23 ENCOUNTER — Ambulatory Visit: Payer: PRIVATE HEALTH INSURANCE | Admitting: Occupational Therapy

## 2015-01-23 DIAGNOSIS — M25521 Pain in right elbow: Secondary | ICD-10-CM

## 2015-01-23 DIAGNOSIS — R29898 Other symptoms and signs involving the musculoskeletal system: Secondary | ICD-10-CM

## 2015-01-23 NOTE — Therapy (Signed)
[facility] [address] [place], [place], [address] Phone: [phone]   Fax:  [phone]  Occupational Therapy Treatment  Patient Details  Name: Monica Petty MRN: [id number] Date of Birth: Feb 23, 1966 Referring Provider:  Roseanne Kaufman, MD  Encounter Date: 01/23/2015      OT End of Session - 01/23/15 0823    Visit Number 12   Number of Visits 17   Date for OT Re-Evaluation 02/01/15   Authorization Type Workers Comp approved 12 visits initally followed by an Gross 6 visits approved on 01/14/15 (18 visits total)   Authorization - Visit Number 12   Authorization - Number of Visits 18   OT Start Time 0807   OT Stop Time 0853   OT Time Calculation (min) 46 min   Activity Tolerance Patient tolerated treatment well   Behavior During Therapy [facility] for tasks assessed/performed      No past medical history on file.  Past Surgical History  Procedure Laterality Date  . Abdominal hysterectomy    . Breast surgery      reduction  . Wisdom tooth extraction      There were no vitals filed for this visit.  Visit Diagnosis:  Pain, elbow joint, right  Weakness of right arm      Subjective Assessment - 01/23/15 0807    Subjective  Pt reports she was sore after exercises    Pertinent History Pt injured 09/08/14 and 09/23/14 moving large pts while at work in [place] at [place].  Pt is now wearing elbow extension brace at night and had 1 PT evaluation (scheduled by mistake as pt required to have therapy at [facility] due to Workers Comp)   Limitations light duty at work   Patient Stated Goals use of arm/elbow without pain   Currently in Pain? Yes   Pain Score 3    Pain Location Arm   Pain Orientation Right;Medial   Pain Descriptors / Indicators Aching;Burning   Pain Type Acute pain   Pain Frequency Intermittent   Aggravating Factors  overuse    Pain Relieving Factors voltaren, rest   Multiple Pain Sites No                       OT Treatments/Exercises (OP) - 01/23/15 0001    Elbow Exercises   Other elbow exercises AROM wrist flex/ext with elbow bent (neutral forearm) and extended, AROM wrist flex with elbow ext (forearm in pronation), then AROM wrist ext with elbow extended (forearm in supination):  all x10 with 10sec hold   Other elbow exercises PROM wrist ext with elbow flex neutral forearm  x10,   Pt instructed only to perform in painfree range.   Moist Heat Therapy   Number Minutes Moist Heat 8 Minutes   Moist Heat Location Elbow  before exercise-did not ultrasound due to voltren applied this a.m.   Cryotherapy   Number Minutes Cryotherapy 10 Minutes applied end of session following exercises   Cryotherapy Location Upper arm;Forearm   Type of Cryotherapy Ice pack, Pt was instructed to apply ice following work today.   Manual Therapy   Manual Therapy Massage   Massage to medial elbow/forearm due to tightness/pain                OT Education - 01/23/15 1034    Education provided Yes   Education Details See pt instructions, modified from last visit as pt reports she was sore   Person(s)  Educated Patient   Methods Explanation;Demonstration;Verbal cues;Handout   Comprehension Verbalized understanding;Returned demonstration          OT Short Term Goals - 01/12/15 1541    OT SHORT TERM GOAL #1   Title Pt will be independent with stretching HEP.--STGs due 01/02/15   Time 4   Period Weeks   Status Achieved   OT SHORT TERM GOAL #2   Title Pt will verbalize understanding of activity modifcations to decrease pain prn including splint wear/care.   Time 4   Period Weeks   Status Achieved   OT SHORT TERM GOAL #3   Title Pt will report pain less than or equal to 3/10 consistently for light activity.   Baseline pain 4/10 following spray bottle use   Time 4   Period Weeks   Status On-going   OT SHORT TERM GOAL #4   Title Pt improve grip strength to at least 60lbs  for gripping/lifting tasks.   Baseline 48-52 with 3-7/10 pain (increased with elbow extended)   Time 4   Period Weeks   Status On-going           OT Long Term Goals - 12/02/14 1416    OT LONG TERM GOAL #1   Title Pt will be independent with strengthening HEP when appropriate.--LTGs due 02/01/15   Time 8   Period Weeks   Status New   OT LONG TERM GOAL #2   Title Pt improve grip strength to at least 75lbs for gripping/lifting tasks.   Baseline 48-52 with 3-7/10 pain (increased with elbow extended)   Time 8   Period Weeks   Status New   OT LONG TERM GOAL #3   Title Pt will be able to retrieve 5lb object from overhead shelf with pain less than 1/10 for ADLs and lifting tasks.   Time 8   Period Weeks   Status New   OT LONG TERM GOAL #4   Title Pt will be able to lift/carry 35lbs with both UEs with less than 1/10 pain in prep for work activities.   Time 8   Period Weeks   Status New   OT LONG TERM GOAL #5   Title Pt will improve dominant RUE functional use as shown by rating Quick DASH at 15% disability or less.   Baseline 34.09%   Time 8   Period Weeks   Status New               Plan - 01/23/15 1035    Clinical Impression Statement Pt is progressing towards goals. Pt's lateral epicondylitis appears resolved, therapist is emphasizing treatment for medial epicondylitis.   Pt will benefit from skilled therapeutic intervention in order to improve on the following deficits (Retired) Pain;Impaired UE functional use;Decreased knowledge of use of DME;Decreased strength;Decreased activity tolerance;Increased edema   Rehab Potential Good   OT Frequency 2x / week   OT Duration 8 weeks   OT Treatment/Interventions Self-care/ADL training;Moist Heat;Fluidtherapy;DME and/or AE instruction;Splinting;Patient/family education;Therapeutic exercises;Contrast Bath;Ultrasound;Therapeutic exercise;Therapeutic activities;Passive range of motion;Iontophoresis;Cryotherapy;Electrical  Stimulation;Manual Therapy;Neuromuscular education   Plan modalities, progress HEP as able, check goals and renew next visit.   OT Home Exercise Plan all phase II exercises issued, see updated HEP from 01/23/15 to focus more on medial epicondylitis exercise one phase 3 medial epicondylitis   Consulted and Agree with Plan of Care Patient        Problem List There are no active problems to display for this patient.   RINE,KATHRYN 01/23/2015, 10:49 AM  Theone Murdoch, OTR/L [phone] Phone: [phone] 10:49 AM 01/23/2015 [place] [address] [place], [place], [address] Phone: [phone]   Fax:  [phone]

## 2015-01-23 NOTE — Patient Instructions (Addendum)
Perform x 10 reps hold 10-15 secs) With elbow flexed and forearm in neutral position actively extend the wrist  With elbow extended and forearm in neutral position actively extend the wrist  With elbow extended and forearm supinated actively extend the wrist  Passive stretching:(10-15 reps hold for 10-15 secs  With elbow flexed at 90*, and forearm in neutral position, stretch wrist in extension, stop before pain!

## 2015-01-27 ENCOUNTER — Encounter: Payer: Self-pay | Admitting: Occupational Therapy

## 2015-01-27 ENCOUNTER — Ambulatory Visit: Payer: PRIVATE HEALTH INSURANCE | Admitting: Occupational Therapy

## 2015-01-27 DIAGNOSIS — M25521 Pain in right elbow: Secondary | ICD-10-CM | POA: Diagnosis not present

## 2015-01-27 DIAGNOSIS — R29898 Other symptoms and signs involving the musculoskeletal system: Secondary | ICD-10-CM

## 2015-01-27 NOTE — Therapy (Signed)
[facility] [address] [place], [place], [address] Phone: [phone]   Fax:  [phone]  Occupational Therapy Treatment  Patient Details  Name: Monica Petty MRN: [id number] Date of Birth: [birth year] Referring Provider:  Roseanne Kaufman, MD  Encounter Date: 01/27/2015      OT End of Session - 01/27/15 1524    Visit Number 13   Number of Visits 17   Date for OT Re-Evaluation 02/01/15   Authorization Type Workers Comp approved 12 visits initally followed by an [place] 6 visits approved on 01/14/15 (18 visits total)   Authorization Time Period wk 8/8, renew next visit   Authorization - Visit Number 13   Authorization - Number of Visits 18   OT Start Time 1450   OT Stop Time 1532   OT Time Calculation (min) 42 min   Activity Tolerance Patient tolerated treatment well   Behavior During Therapy [facility] for tasks assessed/performed      History reviewed. No pertinent past medical history.  Past Surgical History  Procedure Laterality Date  . Abdominal hysterectomy    . Breast surgery      reduction  . Wisdom tooth extraction      There were no vitals filed for this visit.  Visit Diagnosis:  Pain, elbow joint, right  Weakness of right arm      Subjective Assessment - 01/27/15 1527    Subjective  Pt reports heavy doors and holding hair dryer and chopping/cutting food with knife aggravats.   Pertinent History Pt injured 09/08/14 and 09/23/14 moving large pts while at work in [place] at [place].  Pt is now wearing elbow extension brace at night and had 1 PT evaluation (scheduled by mistake as pt required to have therapy at [facility] due to Workers Comp)   Limitations light duty at work   Repetition Increases Symptoms   Currently in Pain? Yes   Pain Score 2    Pain Location Arm   Pain Orientation Medial;Right   Pain Descriptors / Indicators Aching;Burning   Aggravating Factors  heavy doors, using hairdryer,  cutting/chopping with a knife   Pain Relieving Factors voltaren, rest                      OT Treatments/Exercises (OP) - 01/27/15 0001    Cryotherapy   Number Minutes Cryotherapy 8 Minutes   Cryotherapy Location Upper arm;Forearm   Type of Cryotherapy Ice pack  with no adverse reactions   Ultrasound   Ultrasound Location R medial forearm   Ultrasound Parameters 29mhz, 1.o wts/cm2, 50% x44min for medial forearm   Ultrasound Goals Edema;Pain  with no adverse reactions   Manual Therapy   Massage to medial elbow/forearm due to tightness/pain                OT Education - 01/27/15 1530    Education Details added PROM elbow flex, supination, and wrist ext (and reviewed current HEP--see pt instructions)   Person(s) Educated Patient   Methods Explanation;Demonstration;Handout;Verbal cues   Comprehension Verbalized understanding;Returned demonstration          OT Short Term Goals - 01/27/15 1543    OT SHORT TERM GOAL #1   Title Pt will be independent with stretching HEP.--STGs due 01/02/15   Time 4   Period Weeks   Status Achieved   OT SHORT TERM GOAL #2   Title Pt will verbalize understanding of activity modifcations to decrease pain prn including splint wear/care.  Time 4   Period Weeks   Status Achieved   OT SHORT TERM GOAL #3   Title Pt will report pain less than or equal to 3/10 consistently for light activity.   Baseline pain 4/10 following spray bottle use   Time 4   Period Weeks   Status Achieved  01/27/15:  0-2/10   OT SHORT TERM GOAL #4   Title Pt improve grip strength to at least 60lbs for gripping/lifting tasks.   Baseline 48-52 with 3-7/10 pain (increased with elbow extended)   Time 4   Period Weeks   Status On-going           OT Long Term Goals - 01/27/15 1544    OT LONG TERM GOAL #1   Title Pt will be independent with strengthening HEP when appropriate.--LTGs due 02/01/15   Time 8   Period Weeks   Status On-going  01/27/15:   not yet appropriate due to continued pain   OT LONG TERM GOAL #2   Title Pt improve grip strength to at least 75lbs for gripping/lifting tasks.   Baseline 48-52 with 3-7/10 pain (increased with elbow extended)   Time 8   Period Weeks   Status New   OT LONG TERM GOAL #3   Title Pt will be able to retrieve 5lb object from overhead shelf with pain less than 1/10 for ADLs and lifting tasks.   Time 8   Period Weeks   Status New   OT LONG TERM GOAL #4   Title Pt will be able to lift/carry 35lbs with both UEs with less than 1/10 pain in prep for work activities.   Time 8   Period Weeks   Status New   OT LONG TERM GOAL #5   Title Pt will improve dominant RUE functional use as shown by rating Quick DASH at 15% disability or less.   Baseline 34.09%   Time 8   Period Weeks   Status New               Plan - 01/27/15 1542    Clinical Impression Statement Pt continues to progress towards goals.  Plan to renew next session.   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   [facility] 01/27/2015, 3:45 PM  [facility] [address] [place], [place], [address] Phone: [phone]   Fax:  [place], OTR/L 01/27/2015 3:45 PM

## 2015-01-27 NOTE — Patient Instructions (Signed)
Perform x 10 reps hold 10-15 secs) With elbow flexed and forearm in neutral position actively extend the wrist  With elbow extended and forearm in neutral position actively extend the wrist  With elbow extended and forearm supinated actively extend the wrist  Passive stretching:(10-15 reps hold for 10-15 secs  With elbow flexed at 90*, and forearm in neutral position, stretch wrist in extension, stop before pain!  With elbow flexed at 90*, and forearm supinated, stretch wrist in extension, stop before pain!

## 2015-02-05 ENCOUNTER — Encounter: Payer: Self-pay | Admitting: Occupational Therapy

## 2015-02-05 ENCOUNTER — Ambulatory Visit: Payer: PRIVATE HEALTH INSURANCE | Attending: Orthopedic Surgery | Admitting: Occupational Therapy

## 2015-02-05 DIAGNOSIS — R29898 Other symptoms and signs involving the musculoskeletal system: Secondary | ICD-10-CM | POA: Insufficient documentation

## 2015-02-05 DIAGNOSIS — M25521 Pain in right elbow: Secondary | ICD-10-CM | POA: Insufficient documentation

## 2015-02-05 NOTE — Therapy (Signed)
[facility] [address] [place], [place], [address] Phone: [phone]   Fax:  [phone]  Occupational Therapy Treatment  Patient Details  Name: Monica Petty MRN: [id number] Date of Birth: [birth year] Referring Provider:  Marianna Fuss, MD  Encounter Date: 02/05/2015      OT End of Session - 02/05/15 1340    Visit Number 14   Number of Visits 17   Date for OT Re-Evaluation 03/12/15   Authorization Type Workers Comp approved 12 visits initally followed by an [place] 6 visits approved on 01/14/15 (18 visits total)   Authorization Time Period renewed 02/05/15, wk 1/6   Authorization - Visit Number 9   Authorization - Number of Visits 18   OT Start Time 1317   OT Stop Time 1400   OT Time Calculation (min) 43 min   Activity Tolerance Patient tolerated treatment well   Behavior During Therapy [facility] for tasks assessed/performed      History reviewed. No pertinent past medical history.  Past Surgical History  Procedure Laterality Date  . Abdominal hysterectomy    . Breast surgery      reduction  . Wisdom tooth extraction      There were no vitals filed for this visit.  Visit Diagnosis:  Pain, elbow joint, right  Weakness of right arm      Subjective Assessment - 02/05/15 1338    Subjective  "It's about the same"   Pertinent History Pt injured 09/08/14 and 09/23/14 moving large pts while at work in [place] at [place].  Pt is now wearing elbow extension brace at night and had 1 PT evaluation (scheduled by mistake as pt required to have therapy at [facility] due to Workers Comp)   Limitations light duty at work   Repetition Increases Symptoms   Patient Stated Goals use of arm/elbow without pain   Currently in Pain? Yes   Pain Score --  0-2/10   Pain Location Arm   Pain Orientation Right;Medial   Pain Descriptors / Indicators Aching;Burning   Aggravating Factors  heavy doors, using hairdryer,  cutting/chopping with knife   Pain Relieving Factors voltaren, rest                      OT Treatments/Exercises (OP) - 02/05/15 0001    ADLs   ADL Comments Checked goals and discussed progress.  Quick Deliah [place] completed with score of 34.09%  Pt instructed in use of and issued foam grip for pen to reduce amount of pinch needed for writing to reduce pain.  Pt verbalized understanding of use.   Elbow Exercises   Other elbow exercises Reviewed current HEP:  AROM exercises x5 each and PROM ex x10 each and added next exercise (see pt instructions)   Cryotherapy   Number Minutes Cryotherapy 10 Minutes   Cryotherapy Location Forearm  elbow   Type of Cryotherapy Ice pack  with no adverse reactions   Ultrasound   Ultrasound Location R medial elbow/forearm   Ultrasound Parameters 48mz, 1.0wts/cm2, 50% pulsed x8 min    Ultrasound Goals Edema;Pain  with no adverse reactions   Manual Therapy   Soft tissue mobilization to medial elbow/forearm due to tightness/pain                OT Education - 02/05/15 1712    Education Details Reviewed current HEP and added PROM elbow extension with wrist ext. (see pt instructions)   Person(s) Educated Patient   Methods  Explanation;Demonstration;Verbal cues;Handout   Comprehension Verbalized understanding;Returned demonstration          OT Short Term Goals - 02/05/15 1348    OT SHORT TERM GOAL #1   Title Pt will be independent with stretching HEP.--STGs due 01/02/15   Time 4   Period Weeks   Status Achieved   OT SHORT TERM GOAL #2   Title Pt will verbalize understanding of activity modifcations to decrease pain prn including splint wear/care.   Time 4   Period Weeks   Status Achieved   OT SHORT TERM GOAL #3   Title Pt will report pain less than or equal to 3/10 consistently for light activity.   Baseline pain 4/10 following spray bottle use   Time 4   Period Weeks   Status Achieved  01/27/15:  0-2/10   OT SHORT TERM GOAL #4    Title Pt improve grip strength to at least 60lbs for gripping/lifting tasks.--check 02/26/15   Baseline 48-52 with 3-7/10 pain (increased with elbow extended)   Time 4   Period Weeks   Status On-going  47lbs with 4/10 pain           OT Long Term Goals - 02/05/15 1342    OT LONG TERM GOAL #1   Title Pt will be independent with strengthening HEP when appropriate.--check LTGs by 03/12/15   Time 8   Period Weeks   Status On-going  01/27/15:  not yet appropriate due to continued pain   OT LONG TERM GOAL #2   Title Pt improve grip strength to at least 75lbs for gripping/lifting tasks.   Baseline 48-52 with 3-7/10 pain (increased with elbow extended)   Time 8   Period Weeks   Status On-going  not met 02/05/15:  47lbs but 4/10 pain   OT LONG TERM GOAL #3   Title Pt will be able to retrieve 5lb object from overhead shelf with pain less than 1/10 for ADLs and lifting tasks.   Time 8   Period Weeks   Status On-going  not appropriate to assess due to pain 02/04/15   OT LONG TERM GOAL #4   Title Pt will be able to lift/carry 35lbs with both UEs with less than 1/10 pain in prep for work activities.   Time 8   Period Weeks   Status On-going  02/05/15:  not appropriate to assess due to pain   OT LONG TERM GOAL #5   Title Pt will improve dominant RUE functional use as shown by rating Quick DASH at 15% disability or less.   Baseline 34.09%   Time 8   Period Weeks   Status On-going  02/05/15:  34.09%               Plan - 02/05/15 1718    Clinical Impression Statement Pt tolerating progressive stretching well, but continues to have pain with any resistance (therefore, did not attempt all strengthening/resistive goals to avoid injury/incr pain per Quick DASH/pt report).  Progress slow, but pt is able to tolerate HEP progression.  Pt would benefit from continued OT to further progress HEP and begin light stengthening activities in 1-2 weeks if pt tolerates.   Pt will benefit from  skilled therapeutic intervention in order to improve on the following deficits (Retired) Pain;Impaired UE functional use;Decreased knowledge of use of DME;Decreased strength;Decreased activity tolerance;Increased edema   Rehab Potential Good   OT Frequency 1x / week   OT Duration 6 weeks  pt approved for 4 more  visits + today)   OT Treatment/Interventions Self-care/ADL training;Moist Heat;Fluidtherapy;DME and/or AE instruction;Splinting;Patient/family education;Therapeutic exercises;Contrast Bath;Ultrasound;Therapeutic exercise;Therapeutic activities;Passive range of motion;Iontophoresis;Cryotherapy;Electrical Stimulation;Manual Therapy;Neuromuscular education   Plan modalities, add remaining PROM stretch (elbow ext, wrist ext, with supination if appropriate)   OT Home Exercise Plan all phase II exercises issued, see updated HEP from 01/23/15 to 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   [facility] 02/05/2015, 5:28 PM  [facility] [address] [place], [place], [address] Phone: [phone]   Fax:  [place], OTR/L 02/05/2015 5:28 PM

## 2015-02-05 NOTE — Patient Instructions (Addendum)
Active Exercises (Perform x 10 reps hold 10-15 secs): 1. With elbow flexed and forearm in neutral position, actively extend the wrist  2. With elbow flexed and forearm supinated,  actively extend the wrist  3. With elbow extended and forearm in neutral position actively extend the wrist  4. With elbow extended and forearm supinated actively extend the wrist  Passive stretching  (10-15 reps hold for 10-15 secs):  5. With elbow flexed at 90*, and forearm in neutral position, stretch wrist in extension, stop before pain!  6. With elbow flexed at 90*, and forearm supinated, stretch wrist in extension, stop before pain!  7. With elbow extended, and forearm in neutral position, stretch wrist in extension, stop before pain!

## 2015-02-09 ENCOUNTER — Ambulatory Visit: Payer: PRIVATE HEALTH INSURANCE | Admitting: Occupational Therapy

## 2015-02-09 ENCOUNTER — Encounter: Payer: Self-pay | Admitting: Occupational Therapy

## 2015-02-09 DIAGNOSIS — M25521 Pain in right elbow: Secondary | ICD-10-CM

## 2015-02-09 DIAGNOSIS — R29898 Other symptoms and signs involving the musculoskeletal system: Secondary | ICD-10-CM

## 2015-02-09 NOTE — Therapy (Signed)
[facility] [address] [place], [place], [address] Phone: [phone]   Fax:  [phone]  Occupational Therapy Treatment  Patient Details  Name: Monica Petty MRN: [id number] Date of Birth: [birth year] Referring Provider:  Marianna Fuss, MD  Encounter Date: 02/09/2015      OT End of Session - 02/09/15 1511    Visit Number 15   Number of Visits 17   Date for OT Re-Evaluation 03/12/15   Authorization Type Workers Comp approved 12 visits initally followed by an [place] 6 visits approved on 01/14/15 (18 visits total)   Authorization Time Period renewed 02/05/15, wk 1/6   Authorization - Visit Number 15   Authorization - Number of Visits 18   OT Start Time 1449   OT Stop Time 1530   OT Time Calculation (min) 41 min   Activity Tolerance Patient tolerated treatment well   Behavior During Therapy [facility] for tasks assessed/performed      History reviewed. No pertinent past medical history.  Past Surgical History  Procedure Laterality Date  . Abdominal hysterectomy    . Breast surgery      reduction  . Wisdom tooth extraction      There were no vitals filed for this visit.  Visit Diagnosis:  Pain, elbow joint, right  Weakness of right arm      Subjective Assessment - 02/09/15 1512    Subjective  "It's about the same"   Pertinent History Pt injured 09/08/14 and 09/23/14 moving large pts while at work in [place] at [place].  Pt is now wearing elbow extension brace at night and had 1 PT evaluation (scheduled by mistake as pt required to have therapy at [facility] due to Workers Comp)   Repetition Increases Symptoms   Patient Stated Goals use of arm/elbow without pain   Currently in Pain? No/denies   Pain Score --  0-2/10   Pain Location Arm   Pain Orientation Right;Medial   Pain Descriptors / Indicators Aching   Aggravating Factors  heavy doors, using hairdryer, cutting/chopping with knife   Pain  Relieving Factors voltaren, rest                      OT Treatments/Exercises (OP) - 02/09/15 0001    Cryotherapy   Number Minutes Cryotherapy 7 Minutes   Cryotherapy Location Forearm   Type of Cryotherapy Ice massage   Ultrasound   Ultrasound Location medial elbow/forearm    Ultrasound Parameters 18mz, 1.0wts/cm2, 20% x8 with no adverse reactions   Ultrasound Goals Edema;Pain   Manual Therapy   Soft tissue mobilization to medial elbow/forearm due to tightness/pain                OT Education - 02/09/15 1513    Education Details Reviewed current HEP and added PROM elbow ext with wrist ext with supination (see pt instructions)   Person(s) Educated Patient   Methods Explanation;Demonstration;Verbal cues;Handout   Comprehension Verbalized understanding;Returned demonstration          OT Short Term Goals - 02/05/15 1348    OT SHORT TERM GOAL #1   Title Pt will be independent with stretching HEP.--STGs due 01/02/15   Time 4   Period Weeks   Status Achieved   OT SHORT TERM GOAL #2   Title Pt will verbalize understanding of activity modifcations to decrease pain prn including splint wear/care.   Time 4   Period Weeks   Status Achieved  OT SHORT TERM GOAL #3   Title Pt will report pain less than or equal to 3/10 consistently for light activity.   Baseline pain 4/10 following spray bottle use   Time 4   Period Weeks   Status Achieved  01/27/15:  0-2/10   OT SHORT TERM GOAL #4   Title Pt improve grip strength to at least 60lbs for gripping/lifting tasks.--check 02/26/15   Baseline 48-52 with 3-7/10 pain (increased with elbow extended)   Time 4   Period Weeks   Status On-going  47lbs with 4/10 pain           OT Long Term Goals - 02/05/15 1342    OT LONG TERM GOAL #1   Title Pt will be independent with strengthening HEP when appropriate.--check LTGs by 03/12/15   Time 8   Period Weeks   Status On-going  01/27/15:  not yet appropriate due to  continued pain   OT LONG TERM GOAL #2   Title Pt improve grip strength to at least 75lbs for gripping/lifting tasks.   Baseline 48-52 with 3-7/10 pain (increased with elbow extended)   Time 8   Period Weeks   Status On-going  not met 02/05/15:  47lbs but 4/10 pain   OT LONG TERM GOAL #3   Title Pt will be able to retrieve 5lb object from overhead shelf with pain less than 1/10 for ADLs and lifting tasks.   Time 8   Period Weeks   Status On-going  not appropriate to assess due to pain 02/04/15   OT LONG TERM GOAL #4   Title Pt will be able to lift/carry 35lbs with both UEs with less than 1/10 pain in prep for work activities.   Time 8   Period Weeks   Status On-going  02/05/15:  not appropriate to assess due to pain   OT LONG TERM GOAL #5   Title Pt will improve dominant RUE functional use as shown by rating Quick DASH at 15% disability or less.   Baseline 34.09%   Time 8   Period Weeks   Status On-going  02/05/15:  34.09%               Plan - 02/09/15 1531    Clinical Impression Statement Pt continues to tolerate progressive stretching well with no incr in pain.   Plan modalities, begin light strengthening if tolerated   Consulted and Agree with Plan of Care Patient        Problem List There are no active problems to display for this patient.   [facility] 02/09/2015, 3:35 PM  [facility] [address] [place], [place], [address] Phone: [phone]   Fax:  [place], OTR/L 02/09/2015 3:35 PM

## 2015-02-09 NOTE — Patient Instructions (Signed)
Active Exercises (Perform x 10 reps hold 10-15 secs): 1. With elbow flexed and forearm in neutral position, actively extend the wrist  2. With elbow flexed and forearm supinated, actively extend the wrist  3. With elbow extended and forearm in neutral position actively extend the wrist  4. With elbow extended and forearm supinated actively extend the wrist  Passive stretching (10-15 reps hold for 10-15 secs):  5. With elbow flexed at 90*, and forearm in neutral position, stretch wrist in extension, stop before pain!  6. With elbow flexed at 90*, and forearm supinated, stretch wrist in extension, stop before pain!  7. With elbow extended, and forearm in neutral position, stretch wrist in extension, stop before pain!  8. With elbow extended, and forearm supinated, stretch wrist in extension, stop before pain!

## 2015-02-19 ENCOUNTER — Ambulatory Visit: Payer: Worker's Compensation | Attending: Orthopedic Surgery | Admitting: Occupational Therapy

## 2015-02-19 ENCOUNTER — Encounter: Payer: Self-pay | Admitting: Occupational Therapy

## 2015-02-19 DIAGNOSIS — R29898 Other symptoms and signs involving the musculoskeletal system: Secondary | ICD-10-CM | POA: Insufficient documentation

## 2015-02-19 DIAGNOSIS — M25521 Pain in right elbow: Secondary | ICD-10-CM | POA: Insufficient documentation

## 2015-02-19 NOTE — Patient Instructions (Signed)
      Flexion (Resistive)   Hold a can weighing 1lb in hand and bend elbow, keeping wrist straight. hold elbow close to body. Hold 3 seconds.  Then straighten elbow all the way. Repeat 10 times. Do 2 sessions per day.  Copyright  VHI. All rights reserved.  Extension (Resistive)   Hold can weighing 1lb. Point elbow up and out, and straighten arm without moving shoulder. Hold 3 seconds. Lower slowly by bending elbow. Can do this lying down. Repeat 10 times. Do 2 sessions per day.  Copyright  VHI. All rights reserved.   Wrist Flexion: Resisted   With right palm up, 1 pound weight in hand, bend wrist up. Return slowly. Repeat 10 times per set.  Do 2 sessions per day.  Wrist Extension: Resisted   With right palm down, 1 pound weight in hand, bend wrist up. Return slowly. Repeat 10 times per set. Do 2 sessions per day.   1. Grip Strengthening (Resistive Putty)   Squeeze putty using thumb and all fingers. Repeat 20 times. Do 2 sessions per day.   Extension (Assistive Putty)   Roll putty back and forth, being sure to use all fingertips. Repeat 3 times. Do 2 sessions per day.  Then pinch as below.   Palmar Pinch Strengthening (Resistive Putty)   Pinch putty between thumb and each fingertip in turn after rolling out

## 2015-02-19 NOTE — Therapy (Signed)
[facility] [address] [place], [place], [address] Phone: [phone]   Fax:  [phone]  Occupational Therapy Treatment  Patient Details  Name: Monica Petty MRN: [id number] Date of Birth: 1966-07-30 Referring Provider:  Marianna Fuss, MD  Encounter Date: 02/19/2015      OT End of Session - 02/19/15 1559    Visit Number 16   Number of Visits 19   Date for OT Re-Evaluation 03/12/15   Authorization Type Workers Comp approved 12 visits initally followed by an [place] 6 visits approved on 01/14/15 (18 visits total)   Authorization Time Period renewed 02/05/15, wk 3/6   Authorization - Visit Number 31   Authorization - Number of Visits 18   OT Start Time 1534   OT Stop Time 1615   OT Time Calculation (min) 41 min   Activity Tolerance Patient tolerated treatment well   Behavior During Therapy [facility] for tasks assessed/performed      History reviewed. No pertinent past medical history.  Past Surgical History  Procedure Laterality Date  . Abdominal hysterectomy    . Breast surgery      reduction  . Wisdom tooth extraction      There were no vitals filed for this visit.  Visit Diagnosis:  Pain, elbow joint, right  Weakness of right arm      Subjective Assessment - 02/19/15 1537    Subjective  I think that I aggravated it this weekend when sitting in chair without arms and crossing arms, but I didn't feel it until after.  I couldn't do all the exercises for 2 days.   Pertinent History Pt injured 09/08/14 and 09/23/14 moving large pts while at work in [place] at [place].  Pt is now wearing elbow extension brace at night and had 1 PT evaluation (scheduled by mistake as pt required to have therapy at [facility] due to Workers Comp)   Limitations light duty at work   Repetition Increases Symptoms   Patient Stated Goals use of arm/elbow without pain   Currently in Pain? Yes   Pain Score --  3-4/10   Pain  Location Arm   Pain Orientation Right;Medial   Pain Descriptors / Indicators Aching;Burning  stretching   Pain Type Acute pain   Aggravating Factors  heavy doors, using hairdryer, cutting/chopping with knife   Pain Relieving Factors voltaren, rest                      OT Treatments/Exercises (OP) - 02/19/15 0001    Elbow Exercises   Other elbow exercises AROM and PROM exercises for medial epicondylitis x5 each with 10sec hold.   Ultrasound   Ultrasound Location R medial elbow/forearm   Ultrasound Parameters 35mz, 1.0 wt/cm2, 20% pulsed x850m    Ultrasound Goals Edema;Pain   Manual Therapy   Soft tissue mobilization to medial elbow/forearm due to tightness/pain                OT Education - 02/19/15 1646    Education provided Yes   Education Details initial gentle strengthening HEP (elbow flex/ext, wrist flex/ext with 1lb wt, yellow putty)   Person(s) Educated Patient   Methods Explanation;Demonstration;Verbal cues;Handout   Comprehension Verbalized understanding;Returned demonstration;Verbal cues required          OT Short Term Goals - 02/05/15 1348    OT SHORT TERM GOAL #1   Title Pt will be independent with stretching HEP.--STGs due 01/02/15   Time  4   Period Weeks   Status Achieved   OT SHORT TERM GOAL #2   Title Pt will verbalize understanding of activity modifcations to decrease pain prn including splint wear/care.   Time 4   Period Weeks   Status Achieved   OT SHORT TERM GOAL #3   Title Pt will report pain less than or equal to 3/10 consistently for light activity.   Baseline pain 4/10 following spray bottle use   Time 4   Period Weeks   Status Achieved  01/27/15:  0-2/10   OT SHORT TERM GOAL #4   Title Pt improve grip strength to at least 60lbs for gripping/lifting tasks.--check 02/26/15   Baseline 48-52 with 3-7/10 pain (increased with elbow extended)   Time 4   Period Weeks   Status On-going  47lbs with 4/10 pain           OT  Long Term Goals - 02/05/15 1342    OT LONG TERM GOAL #1   Title Pt will be independent with strengthening HEP when appropriate.--check LTGs by 03/12/15   Time 8   Period Weeks   Status On-going  01/27/15:  not yet appropriate due to continued pain   OT LONG TERM GOAL #2   Title Pt improve grip strength to at least 75lbs for gripping/lifting tasks.   Baseline 48-52 with 3-7/10 pain (increased with elbow extended)   Time 8   Period Weeks   Status On-going  not met 02/05/15:  47lbs but 4/10 pain   OT LONG TERM GOAL #3   Title Pt will be able to retrieve 5lb object from overhead shelf with pain less than 1/10 for ADLs and lifting tasks.   Time 8   Period Weeks   Status On-going  not appropriate to assess due to pain 02/04/15   OT LONG TERM GOAL #4   Title Pt will be able to lift/carry 35lbs with both UEs with less than 1/10 pain in prep for work activities.   Time 8   Period Weeks   Status On-going  02/05/15:  not appropriate to assess due to pain   OT LONG TERM GOAL #5   Title Pt will improve dominant RUE functional use as shown by rating Quick DASH at 15% disability or less.   Baseline 34.09%   Time 8   Period Weeks   Status On-going  02/05/15:  34.09%               Plan - 02/19/15 1648    Clinical Impression Statement Pt tolerated initial strengthening well today, but reports incr pain over the weekend.   Plan modalities, progress strengthening as able, note to MD   OT Home Exercise Plan all phase II exercises issued, see updated HEP from 01/23/15 to focus more on medial epicondylitis, updated HEP 02/05/15 (all AROM and #1-3 of PROM for medial epicondylitis), has #1-4 PROM for medial epicondylitis, light strengthening HEP 02/19/15   Consulted and Agree with Plan of Care Patient        Problem List There are no active problems to display for this patient.   [facility] 02/19/2015, 4:51 PM  [facility] [address] [place], [place], [address] Phone: [phone]   Fax:  [place], OTR/L 02/19/2015 4:51 PM

## 2015-02-25 ENCOUNTER — Ambulatory Visit: Payer: Worker's Compensation | Admitting: Occupational Therapy

## 2015-02-25 DIAGNOSIS — M25521 Pain in right elbow: Secondary | ICD-10-CM | POA: Diagnosis not present

## 2015-02-25 DIAGNOSIS — R29898 Other symptoms and signs involving the musculoskeletal system: Secondary | ICD-10-CM

## 2015-02-25 NOTE — Patient Instructions (Signed)
Continue with previous strengthening HEP, increase to 10 reps 3x day if possible and pain is not increased.  Upgrade putty exercises to red putty, and continue previously issued  HEP with red putty   Apply Ice 8-10 mins after exercise PRN.

## 2015-02-26 ENCOUNTER — Encounter: Payer: Self-pay | Admitting: Occupational Therapy

## 2015-02-26 NOTE — Therapy (Signed)
[facility] [address] [place], [place], [address] Phone: [phone]   Fax:  [phone]  Occupational Therapy Treatment  Patient Details  Name: Monica Petty MRN: [id number] Date of Birth: 07-03-1966 Referring Provider:  Marianna Fuss, MD  Encounter Date: 02/25/2015      OT End of Session - 02/26/15 1659    Visit Number 17   Number of Visits 19   Date for OT Re-Evaluation 03/12/15   Authorization Type Workers Comp approved 12 visits initally followed by an [place] 6 visits approved on 01/14/15 (18 visits total)   Authorization - Visit Number 17   Authorization - Number of Visits 18   OT Start Time 1450   OT Stop Time 1540   OT Time Calculation (min) 50 min   Activity Tolerance Patient limited by pain   Behavior During Therapy [facility] for tasks assessed/performed      No past medical history on file.  Past Surgical History  Procedure Laterality Date  . Abdominal hysterectomy    . Breast surgery      reduction  . Wisdom tooth extraction      There were no vitals filed for this visit.  Visit Diagnosis:  Pain, elbow joint, right  Weakness of right arm      Subjective Assessment - 02/25/15 1451    Subjective  Pt reports prolonged use of RUE aggravates pain.   Pertinent History Pt injured 09/08/14 and 09/23/14 moving large pts while at work in [place] at [place].  Pt is now wearing elbow extension brace at night and had 1 PT evaluation (scheduled by mistake as pt required to have therapy at [facility] due to Workers Comp)   Limitations light duty at work   Repetition Increases Symptoms   Patient Stated Goals use of arm/elbow without pain   Currently in Pain? Yes   Pain Score 2    Pain Location Arm   Pain Orientation Right;Medial   Pain Type Acute pain   Pain Onset More than a month ago   Pain Frequency Intermittent   Aggravating Factors  overuse   Pain Relieving Factors voltaren, rest   Effect  of Pain on Daily Activities on light duty   Multiple Pain Sites No                      OT Treatments/Exercises (OP) - 02/26/15 0001    Exercises   Exercises Elbow;Wrist   Elbow Exercises   Elbow Flexion AROM;Strengthening;10 reps;Seated   Bar Weights/Barbell (Elbow Flexion) 1 lb  x 15 reps, attempted 2 lbs but discontinued due to pain following 5 reps   Elbow Extension AROM;Strengthening;15 reps;Seated   Bar Weights/Barbell (Elbow Extension) 1 lb  x15 reps attempted 2 lbs but discontinued due to pain after 5 reps   Wrist Flexion AROM;Strengthening;10 reps;Seated   Bar Weights/Barbell (Wrist Flexion) 1 lb  x 10 reps, attempted 2 lbs but discontinued due to pain after 5-6 reps   Wrist Flexion Limitations pain with 2 lbs weight   Wrist Extension AROM;Right;Strengthening;10 reps   Bar Weights/Barbell (Wrist Extension) 1 lb  attempted 2 lbs however discontinued due to pain after 5-6 reps   Wrist Extension Limitations pain with 2 lbs   Other elbow exercises AROM and PROM stretching exercises for medial epicondylitis x5 each with 10sec hold.   Additional Elbow Exercises   UBE (Upper Arm Bike) x 5 mins level 3 for conditioning   Theraputty Roll;Grip;Pinch  Theraputty - Roll upgraded to red putty for increased resistance   Theraputty - Grip upgraded to red putty   tip pinch with individual fingers and thumb each   Cryotherapy   Number Minutes Cryotherapy 10 Minutes   Cryotherapy Location Forearm   Type of Cryotherapy Ice pack   Ultrasound   Ultrasound Location R medial elbow/ forearm   Ultrasound Parameters 1MHZ, 1.0 w/cm 2, 20% pulsed x 8 mins   Ultrasound Goals Edema;Pain                  OT Short Term Goals - 02/05/15 1348    OT SHORT TERM GOAL #1   Title Pt will be independent with stretching HEP.--STGs due 01/02/15   Time 4   Period Weeks   Status Achieved   OT SHORT TERM GOAL #2   Title Pt will verbalize understanding of activity modifcations to  decrease pain prn including splint wear/care.   Time 4   Period Weeks   Status Achieved   OT SHORT TERM GOAL #3   Title Pt will report pain less than or equal to 3/10 consistently for light activity.   Baseline pain 4/10 following spray bottle use   Time 4   Period Weeks   Status Achieved  01/27/15:  0-2/10   OT SHORT TERM GOAL #4   Title Pt improve grip strength to at least 60lbs for gripping/lifting tasks.--check 02/26/15   Baseline 48-52 with 3-7/10 pain (increased with elbow extended)   Time 4   Period Weeks   Status On-going  47lbs with 4/10 pain           OT Long Term Goals - 02/26/15 1443    OT LONG TERM GOAL #1   Title Pt will be independent with strengthening HEP when appropriate.--check LTGs by 03/12/15   Baseline Pt is strengthening with 1 lbs weight, she is unable to progress to 2 lbs due to pain and burning in RUE.   Status On-going   OT LONG TERM GOAL #2   Title Pt improve grip strength to at least 75lbs for gripping/lifting tasks.   Baseline 40 lbs with pain present 02/25/15   Status On-going   OT LONG TERM GOAL #3   Title Pt will be able to retrieve 5lb object from overhead shelf with pain less than 1/10 for ADLs and lifting tasks.   Baseline Pt is currently only able to strengthen with 1 lbs weight due to increased pain when she attempted to use 2 lbs for strengthening.   Status On-going   OT LONG TERM GOAL #4   Title Pt will be able to lift/carry 35lbs with both UEs with less than 1/10 pain in prep for work activities.   Baseline not tested due to pain -02/25/15   Status On-going   OT LONG TERM GOAL #5   Title Pt will improve dominant RUE functional use as shown by rating Quick DASH at 15% disability or less.   Baseline not re-tested yet   Status On-going               Plan - 02/26/15 1439    Clinical Impression Statement Pt is progressing towards goals for strengthening slowly, limited by pain. Pt's demonstrates limitations in strength which may  impede her ability to return to work in the next few weeks.Pt may benefit from additional therapy to progress patient's strength for work activities. Pt sees MD next week, therapist to route note to MD.   Pt will  benefit from skilled therapeutic intervention in order to improve on the following deficits (Retired) Pain;Impaired UE functional use;Decreased knowledge of use of DME;Decreased strength;Decreased activity tolerance;Increased edema   Rehab Potential Good   Clinical Impairments Affecting Rehab Potential none   OT Frequency 1x / week   OT Duration 6 weeks   OT Treatment/Interventions Self-care/ADL training;Moist Heat;Fluidtherapy;DME and/or AE instruction;Splinting;Patient/family education;Therapeutic exercises;Contrast Bath;Ultrasound;Therapeutic exercise;Therapeutic activities;Passive range of motion;Iontophoresis;Cryotherapy;Electrical Stimulation;Manual Therapy;Neuromuscular education   Plan modalities, progress strengthening as able   OT Home Exercise Plan  putty exercises upgraded to red today(02/25/15)all phase II exercises issued, see updated HEP from 01/23/15 to focus more on medial epicondylitis, updated HEP 02/05/15 (all AROM and #1-3 of PROM for medial epicondylitis), has #1-4 PROM for medial epicondylitis, light strengthening HEP 02/19/15   Consulted and Agree with Plan of Care Patient        Problem List There are no active problems to display for this patient.   [facility] 02/26/2015, 5:04 PM Theone Murdoch, OTR/L [phone] Phone: [phone] 5:04 PM 02/26/2015 [facility] [address] [place], [place], [address] Phone: [phone]   Fax:  [phone]

## 2015-02-27 ENCOUNTER — Encounter: Payer: Self-pay | Admitting: Occupational Therapy

## 2015-02-27 ENCOUNTER — Telehealth: Payer: Self-pay | Admitting: Occupational Therapy

## 2015-02-27 NOTE — Telephone Encounter (Signed)
Dr. Amedeo Plenty,    Monica Petty sees you next week regarding return to work. She is progressing slowly limited by elbow pain. She is currently only able to tolerate wrist and elbow strengthening with a 1 lb weight. Her work activities in the [place] require heavier lifting , pushing and pulling.         Do you feel she can benefit from additional therapy to continue to address strengthening or a functional capacity eval, prior to returning to her full workload? (He case manager has only approved 1 more OT visit at this time)    Thanks,    [employer], OTR/L

## 2015-03-04 ENCOUNTER — Ambulatory Visit: Payer: Worker's Compensation | Admitting: Occupational Therapy

## 2015-03-04 DIAGNOSIS — R29898 Other symptoms and signs involving the musculoskeletal system: Secondary | ICD-10-CM

## 2015-03-04 DIAGNOSIS — M25521 Pain in right elbow: Secondary | ICD-10-CM

## 2015-03-04 NOTE — Patient Instructions (Addendum)
Flexion (Resistive)   Hold a can weighing 1 lb in hand and bend elbow, keeping wrist straight. hold elbow close to body. Hold 3 seconds.  Then straighten elbow all the way. Repeat 20 times. Do 2 sessions per day.  Copyright  VHI. All rights reserved.   Extension (Resistive)   Hold can weighing 1lb. Point elbow up and out, and straighten arm without moving shoulder. Hold 3 seconds. Lower slowly by bending elbow. Can do this lying down. Repeat  20 times. Do 2 sessions per day.  Copyright  VHI. All rights reserved.   Wrist Flexion: Resisted   With right palm up, 2 pound weight in hand, bend wrist up. Return slowly. Repeat 10 times per set.  Do 2 sessions per day.  Wrist Extension: Resisted   With right palm down, 2 pound weight in hand, bend wrist up. Return slowly. Repeat 10 times per set. Do 2 sessions per day.   Gradually progress the repetition of each exercise, when able to tolerate 20 reps of an exercise with current weight consider increasing weight. Start with only 10 reps of the new weight 2-3x day initially. Stop if increased pain.  Ice after exercise.

## 2015-03-05 NOTE — Therapy (Signed)
[facility] [address] [place], [place], [address] Phone: [phone]   Fax:  [phone]  Occupational Therapy Treatment  Patient Details  Name: Monica Petty MRN: [id number] Date of Birth: 02/07/1966 Referring Provider:  Marianna Fuss, MD  Encounter Date: 03/04/2015      OT End of Session - 03/05/15 1831    Visit Number 18   Number of Visits 18   Date for OT Re-Evaluation 03/12/15   Authorization Type Workers Comp approved 12 visits initally followed by an [place] 6 visits approved on 01/14/15 (18 visits total)   Authorization - Visit Number 18   Authorization - Number of Visits 18   OT Start Time 1450   OT Stop Time 0277   OT Time Calculation (min) 48 min   Activity Tolerance Patient tolerated treatment well   Behavior During Therapy [facility] for tasks assessed/performed      No past medical history on file.  Past Surgical History  Procedure Laterality Date  . Abdominal hysterectomy    . Breast surgery      reduction  . Wisdom tooth extraction      There were no vitals filed for this visit.  Visit Diagnosis:  Pain, elbow joint, right  Weakness of right arm                    OT Treatments/Exercises (OP) - 03/05/15 0001    Exercises   Exercises Elbow;Wrist   Elbow Exercises   Elbow Flexion AROM;Strengthening;10 reps;Seated   Bar Weights/Barbell (Elbow Flexion) 1 lb   Elbow Extension AROM;Strengthening;15 reps;Supine   Bar Weights/Barbell (Elbow Extension) 1 lb   Wrist Flexion AROM;Strengthening;10 reps;Seated   Bar Weights/Barbell (Wrist Flexion) 2 lbs   Wrist Extension AROM;Right;Strengthening;10 reps   Bar Weights/Barbell (Wrist Extension) 2 lbs   Other elbow exercises Discussed how to progress exercises at home and checked progress towards goals.   Cryotherapy   Number Minutes Cryotherapy 10 Minutes   Cryotherapy Location Forearm   Type of Cryotherapy Ice pack    Ultrasound   Ultrasound Location R medial forearm   Ultrasound Parameters 1 MHZ, 1.0 w/cm 20% pulsed x 8 mins   Ultrasound Goals Edema;Pain                OT Education - 03/05/15 1825    Education provided Yes   Education Details reviewed final HEP and how to progress at home.   Person(s) Educated Patient   Methods Explanation;Demonstration;Verbal cues;Handout   Comprehension Verbalized understanding;Returned demonstration          OT Short Term Goals - 03/04/15 1506    OT SHORT TERM GOAL #1   Title Pt will be independent with stretching HEP.--STGs due 01/02/15   Time 4   Period Weeks   Status Achieved   OT SHORT TERM GOAL #2   Title Pt will verbalize understanding of activity modifcations to decrease pain prn including splint wear/care.   Time 4   Period Weeks   Status Achieved   OT SHORT TERM GOAL #3   Title Pt will report pain less than or equal to 3/10 consistently for light activity.   Baseline pain 4/10 following spray bottle use   Time 4   Period Weeks   Status Achieved  01/27/15:  0-2/10   OT SHORT TERM GOAL #4   Title Pt improve grip strength to at least 60lbs for gripping/lifting tasks.--check 02/26/15   Baseline 70 lbs with pain 4-5/10,  55 lbs with elbow extended no significant pain   Time 4   Period Weeks   Status Partially Met  47lbs with 4/10 pain           OT Long Term Goals - 03/04/15 1508    OT LONG TERM GOAL #1   Title Pt will be independent with strengthening HEP when appropriate.--check LTGs by 03/12/15   Status Achieved   OT LONG TERM GOAL #2   Title Pt improve grip strength to at least 75lbs for gripping/lifting tasks.   Baseline 70 lbs with pain 4-5/10   Status Not Met   OT LONG TERM GOAL #3   Title Pt will be able to retrieve 5lb object from overhead shelf with pain less than 1/10 for ADLs and lifting tasks.   Baseline Able to retrieve 5 lbs pain 2-3/10   Status Partially Met   OT LONG TERM GOAL #4   Title Pt will be able to  lift/carry 35lbs with both UEs with less than 1/10 pain in prep for work activities.   Baseline not tested due to pain and pt. lifting restrictions 03/04/15   Status Deferred   OT LONG TERM GOAL #5   Title Pt will improve dominant RUE functional use as shown by rating Quick DASH at 15% disability or less.   Baseline 36.3 %- 03/04/15   Status Not Met               Plan - 03/04/15 1514    Clinical Impression Statement Pt has made progress overall with increased RUE strength yet she remains limited by pain. Pt's MD has placed her on light duty until August and has encouraged her to progress strengthening at home as able..   Pt will benefit from skilled therapeutic intervention in order to improve on the following deficits (Retired) Pain;Impaired UE functional use;Decreased knowledge of use of DME;Decreased strength;Decreased activity tolerance;Increased edema   Rehab Potential Good   OT Frequency 1x / week   OT Duration 6 weeks   OT Treatment/Interventions Self-care/ADL training;Moist Heat;Fluidtherapy;DME and/or AE instruction;Splinting;Patient/family education;Therapeutic exercises;Contrast Bath;Ultrasound;Therapeutic exercise;Therapeutic activities;Passive range of motion;Iontophoresis;Cryotherapy;Electrical Stimulation;Manual Therapy;Neuromuscular education   Plan discharge from occupational therapy, pt to continue to progress strengthening at home.   OT Home Exercise Plan  putty exercises upgraded to red today(02/25/15)all phase II exercises issued, see updated HEP from 01/23/15 to focus more on medial epicondylitis, updated HEP 02/05/15 (all AROM and #1-3 of PROM for medial epicondylitis), has #1-4 PROM for medial epicondylitis, light strengthening HEP 02/19/15   Consulted and Agree with Plan of Care Patient        Problem List There are no active problems to display for this patient.   RINE,KATHRYN 03/05/2015, 6:37 PM Theone Murdoch, OTR/L [phone] Phone: [phone] 6:37 PM 03/05/2015 [facility] [address] [place], [place], [address] Phone: [phone]   Fax:  [phone]

## 2015-03-07 ENCOUNTER — Telehealth: Payer: Self-pay | Admitting: Orthopedic Surgery

## 2015-06-29 NOTE — Therapy (Signed)
[facility] [address] [place], [place], [address] Phone: [phone]   Fax:  [phone]  Patient Details  Name: Monica Petty MRN: [id number] Date of Birth: May 07, 1966 Referring Provider:  No ref. provider found  Encounter Date: 02/27/2015       OT Long Term Goals - 03/04/15 1508    OT LONG TERM GOAL #1   Title Pt will be independent with strengthening HEP when appropriate.--check LTGs by 03/12/15   Status Achieved   OT LONG TERM GOAL #2   Title Pt improve grip strength to at least 75lbs for gripping/lifting tasks.   Baseline 70 lbs with pain 4-5/10   Status Not Met   OT LONG TERM GOAL #3   Title Pt will be able to retrieve 5lb object from overhead shelf with pain less than 1/10 for ADLs and lifting tasks.   Baseline Able to retrieve 5 lbs pain 2-3/10   Status Partially Met   OT LONG TERM GOAL #4   Title Pt will be able to lift/carry 35lbs with both UEs with less than 1/10 pain in prep for work activities.   Baseline not tested due to pain and pt. lifting restrictions 03/04/15   Status Deferred   OT LONG TERM GOAL #5   Title Pt will improve dominant RUE functional use as shown by rating Quick DASH at 15% disability or less.   Baseline 36.3 %- 03/04/15   Status Not Met      OCCUPATIONAL THERAPY DISCHARGE SUMMARY    Current functional level related to goals / functional outcomes:See above for progress towards goals. Pt made good progress yet she was limited by pain. Pt transitioned to HEP.   Remaining deficits: Pain, decreased strength   Education / Equipment: Pt was educated regarding HEP. She returned demonstration. MD  requested pt transition to HEP prior to return to work..  Plan: Patient agrees to discharge.  Patient goals were partially met. Patient is being discharged due to the physician's request.  ?????      Crystelle Ferrufino 06/29/2015, 5:58 PM  [facility] [address] [place], [place], [address] Phone: [phone]   Fax:  [phone]

## 2015-11-26 MED FILL — ALPRAZolam 0.5 MG TABS: 0.5 | 30 days supply | Qty: 90 | Fill #1

## 2015-12-24 DIAGNOSIS — Z1231 Encounter for screening mammogram for malignant neoplasm of breast: Secondary | ICD-10-CM | POA: Diagnosis not present

## 2015-12-24 DIAGNOSIS — Z6831 Body mass index (BMI) 31.0-31.9, adult: Secondary | ICD-10-CM | POA: Diagnosis not present

## 2015-12-24 DIAGNOSIS — Z01419 Encounter for gynecological examination (general) (routine) without abnormal findings: Secondary | ICD-10-CM | POA: Diagnosis not present

## 2016-01-06 DIAGNOSIS — Z7689 Persons encountering health services in other specified circumstances: Secondary | ICD-10-CM | POA: Diagnosis not present

## 2016-01-06 DIAGNOSIS — L719 Rosacea, unspecified: Secondary | ICD-10-CM | POA: Insufficient documentation

## 2016-01-06 DIAGNOSIS — I10 Essential (primary) hypertension: Secondary | ICD-10-CM | POA: Diagnosis not present

## 2016-01-06 DIAGNOSIS — R12 Heartburn: Secondary | ICD-10-CM | POA: Insufficient documentation

## 2016-01-06 DIAGNOSIS — E669 Obesity, unspecified: Secondary | ICD-10-CM | POA: Diagnosis not present

## 2016-01-06 MED FILL — HYDROCHLOROTHIAZIDE 25 MG T: 25 | 30 days supply | Qty: 30 | Fill #0

## 2016-01-07 MED FILL — PREMARIN 0.625 MG TABLET: 0.625 | 90 days supply | Qty: 90 | Fill #0

## 2016-01-07 MED FILL — ALPRAZolam 0.5 MG TABS: 0.5 | 30 days supply | Qty: 90 | Fill #0

## 2016-01-14 MED FILL — TRETINOIN 0.025% CREAM: 0.025 | 20 days supply | Qty: 45 | Fill #0

## 2016-01-21 DIAGNOSIS — I10 Essential (primary) hypertension: Secondary | ICD-10-CM | POA: Diagnosis not present

## 2016-01-21 MED FILL — LISINOPRIL 5 MG TABLET: 5 | 30 days supply | Qty: 30 | Fill #0

## 2016-02-04 MED FILL — HYDROCHLOROTHIAZIDE 25 MG T: 25 | 30 days supply | Qty: 30 | Fill #1

## 2016-02-18 MED FILL — LISINOPRIL 5 MG TABLET: 5 | 30 days supply | Qty: 30 | Fill #0

## 2016-02-25 DIAGNOSIS — Z1211 Encounter for screening for malignant neoplasm of colon: Secondary | ICD-10-CM | POA: Diagnosis not present

## 2016-02-25 DIAGNOSIS — I10 Essential (primary) hypertension: Secondary | ICD-10-CM | POA: Diagnosis not present

## 2016-02-25 DIAGNOSIS — M79671 Pain in right foot: Secondary | ICD-10-CM | POA: Diagnosis not present

## 2016-02-25 MED FILL — HYDROCHLOROTHIAZIDE 25 MG T: 25 | 90 days supply | Qty: 90 | Fill #0

## 2016-03-11 MED FILL — ALPRAZolam 0.5 MG TABS: 0.5 | 30 days supply | Qty: 90 | Fill #1

## 2016-03-18 MED FILL — LISINOPRIL 5 MG TABLET: 5 | 90 days supply | Qty: 90 | Fill #0

## 2016-03-29 ENCOUNTER — Encounter: Payer: Self-pay | Admitting: Nurse Practitioner

## 2016-03-31 DIAGNOSIS — R87610 Atypical squamous cells of undetermined significance on cytologic smear of cervix (ASC-US): Secondary | ICD-10-CM | POA: Diagnosis not present

## 2016-04-11 MED FILL — PREMARIN 0.625 MG TABLET: 0.625 | 90 days supply | Qty: 90 | Fill #1

## 2016-04-14 MED FILL — ESTRACE 0.01% CREAM: 0.1 | 90 days supply | Qty: 43 | Fill #0

## 2016-04-20 ENCOUNTER — Encounter: Payer: Self-pay | Admitting: Gastroenterology

## 2016-05-24 ENCOUNTER — Ambulatory Visit (AMBULATORY_SURGERY_CENTER): Payer: Self-pay | Admitting: *Deleted

## 2016-05-24 VITALS — Ht 66.0 in | Wt 185.0 lb

## 2016-05-24 DIAGNOSIS — Z1211 Encounter for screening for malignant neoplasm of colon: Secondary | ICD-10-CM

## 2016-05-24 MED ORDER — NA SULFATE-K SULFATE-MG SULF 17.5-3.13-1.6 GM/177ML PO SOLN: 1.0000 | Freq: Once | ORAL | 0 refills | Status: AC

## 2016-05-24 NOTE — Progress Notes (Signed)
No egg or soy allergy known to patient  No issues with past sedation with any surgeries  or procedures, no intubation problems  No diet pills per patient No home 02 use per patient  No blood thinners per patient  Pt denies issues with constipation  No A fib or A flutter   

## 2016-05-25 MED FILL — ALPRAZolam 0.5 MG TABS: 0.5 | 30 days supply | Qty: 90 | Fill #2

## 2016-05-25 MED FILL — TRETINOIN 0.025% CREAM: 0.025 | 20 days supply | Qty: 45 | Fill #1

## 2016-05-25 MED FILL — HYDROCHLOROTHIAZIDE 25 MG T: 25 | 90 days supply | Qty: 90 | Fill #1

## 2016-05-30 MED FILL — SUPREP BOWEL PREP KIT: 17.5-3.13-1 | 2 days supply | Qty: 354 | Fill #0

## 2016-06-06 ENCOUNTER — Ambulatory Visit (AMBULATORY_SURGERY_CENTER): Payer: 59 | Admitting: Gastroenterology

## 2016-06-06 ENCOUNTER — Encounter: Payer: Self-pay | Admitting: Gastroenterology

## 2016-06-06 VITALS — BP 139/88 | HR 79 | Temp 97.8°F | Resp 13 | Ht 66.0 in | Wt 185.0 lb

## 2016-06-06 DIAGNOSIS — Z1211 Encounter for screening for malignant neoplasm of colon: Secondary | ICD-10-CM | POA: Diagnosis present

## 2016-06-06 DIAGNOSIS — D123 Benign neoplasm of transverse colon: Secondary | ICD-10-CM | POA: Diagnosis not present

## 2016-06-06 HISTORY — PX: COLONOSCOPY: SHX174

## 2016-06-06 MED ORDER — SODIUM CHLORIDE 0.9 % IV SOLN: 500.0000 mL | INTRAVENOUS | Status: DC

## 2016-06-06 NOTE — Progress Notes (Signed)
Called to room for pathology. 

## 2016-06-06 NOTE — Op Note (Signed)
[facility] Patient Name: Monica Petty Procedure Date: 06/06/2016 1:59 PM MRN: [id number] Endoscopist: Mauri Pole , MD Age: 50 Referring MD:  Date of Birth: Dec 21, 1965 Gender: Female Account #: [id number] Procedure:                Colonoscopy Indications:              Screening for colorectal malignant neoplasm, This                            is the patient's first colonoscopy Medicines:                Monitored Anesthesia Care Procedure:                Pre-Anesthesia Assessment:                           - Prior to the procedure, a History and Physical                            was performed, and patient medications and                            allergies were reviewed. The patient's tolerance of                            previous anesthesia was also reviewed. The risks                            and benefits of the procedure and the sedation                            options and risks were discussed with the patient.                            All questions were answered, and informed consent                            was obtained. Prior Anticoagulants: The patient has                            taken no previous anticoagulant or antiplatelet                            agents. ASA Grade Assessment: II - A patient with                            mild systemic disease. After reviewing the risks                            and benefits, the patient was deemed in                            satisfactory condition to undergo the procedure.  After obtaining informed consent, the colonoscope                            was passed under direct vision. Throughout the                            procedure, the patient's blood pressure, pulse, and                            oxygen saturations were monitored continuously. The                            Model CF-HQ190L [id number]) scope was introduced                            through the anus  and advanced to the the terminal                            ileum, with identification of the appendiceal                            orifice and IC valve. The colonoscopy was performed                            without difficulty. The patient tolerated the                            procedure well. The quality of the bowel                            preparation was good. The ileocecal valve,                            appendiceal orifice, and rectum were photographed. Scope In: 2:04:40 PM Scope Out: 2:20:43 PM Scope Withdrawal Time: 0 hours 10 minutes 22 seconds  Total Procedure Duration: 0 hours 16 minutes 3 seconds  Findings:                 The perianal and digital rectal examinations were                            normal.                           A 3 mm polyp was found in the hepatic flexure. The                            polyp was sessile. The polyp was removed with a                            cold biopsy forceps. Resection and retrieval were                            complete.  Multiple small and large-mouthed diverticula were                            found in the sigmoid colon, descending colon and                            transverse colon. There was narrowing of the colon                            in association with the diverticular opening. There                            was no evidence of diverticular bleeding.                           Non-bleeding internal hemorrhoids were found during                            retroflexion. The hemorrhoids were small. Complications:            No immediate complications. Estimated Blood Loss:     Estimated blood loss was minimal. Impression:               - One 3 mm polyp at the hepatic flexure, removed                            with a cold biopsy forceps. Resected and retrieved.                           - Moderate diverticulosis in the sigmoid colon, in                            the descending  colon and in the transverse colon.                            There was narrowing of the colon in association                            with the diverticular opening. There was no                            evidence of diverticular bleeding.                           - Non-bleeding internal hemorrhoids. Recommendation:           - Patient has a contact number available for                            emergencies. The signs and symptoms of potential                            delayed complications were discussed with the  patient. Return to normal activities tomorrow.                            Written discharge instructions were provided to the                            patient.                           - Resume previous diet.                           - Continue present medications.                           - Await pathology results.                           - Repeat colonoscopy in 5-10 years for surveillance                            based on pathology results. Mauri Pole, MD 06/06/2016 2:26:43 PM This report has been signed electronically.

## 2016-06-06 NOTE — Patient Instructions (Signed)
Impressions/recommendations:  Polyps (handout given) Diverticulosis (handout given) High Fiber diet (handout given) Hemorrhoids (handout given)  Repeat colonoscopy 5-10 years based on pathology results.  YOU HAD AN ENDOSCOPIC PROCEDURE TODAY AT [facility]:   Refer to the procedure report that was given to you for any specific questions about what was found during the examination.  If the procedure report does not answer your questions, please call your gastroenterologist to clarify.  If you requested that your care partner not be given the details of your procedure findings, then the procedure report has been included in a sealed envelope for you to review at your convenience later.  YOU SHOULD EXPECT: Some feelings of bloating in the abdomen. Passage of more gas than usual.  Walking can help get rid of the air that was put into your GI tract during the procedure and reduce the bloating. If you had a lower endoscopy (such as a colonoscopy or flexible sigmoidoscopy) you may notice spotting of blood in your stool or on the toilet paper. If you underwent a bowel prep for your procedure, you may not have a normal bowel movement for a few days.  Please Note:  You might notice some irritation and congestion in your nose or some drainage.  This is from the oxygen used during your procedure.  There is no need for concern and it should clear up in a day or so.  SYMPTOMS TO REPORT IMMEDIATELY:   Following lower endoscopy (colonoscopy or flexible sigmoidoscopy):  Excessive amounts of blood in the stool  Significant tenderness or worsening of abdominal pains  Swelling of the abdomen that is new, acute  Fever of 100F or higher   For urgent or emergent issues, a gastroenterologist can be reached at any hour by calling [phone].   DIET:  We do recommend a small meal at first, but then you may proceed to your regular diet.  Drink plenty of fluids but you should avoid alcoholic  beverages for 24 hours.  ACTIVITY:  You should plan to take it easy for the rest of today and you should NOT DRIVE or use heavy machinery until tomorrow (because of the sedation medicines used during the test).    FOLLOW UP: Our staff will call the number listed on your records the next business day following your procedure to check on you and address any questions or concerns that you may have regarding the information given to you following your procedure. If we do not reach you, we will leave a message.  However, if you are feeling well and you are not experiencing any problems, there is no need to return our call.  We will assume that you have returned to your regular daily activities without incident.  If any biopsies were taken you will be contacted by phone or by letter within the next 1-3 weeks.  Please call us at [phone] if you have not heard about the biopsies in 3 weeks.    SIGNATURES/CONFIDENTIALITY: You and/or your care partner have signed paperwork which will be entered into your electronic medical record.  These signatures attest to the fact that that the information above on your After Visit Summary has been reviewed and is understood.  Full responsibility of the confidentiality of this discharge information lies with you and/or your care-partner.

## 2016-06-06 NOTE — Progress Notes (Signed)
Report given to PACU RN, vss 

## 2016-06-07 ENCOUNTER — Telehealth: Payer: Self-pay | Admitting: *Deleted

## 2016-06-07 NOTE — Telephone Encounter (Signed)
  Follow up Call-  Call back number 06/06/2016  Post procedure Call Back phone  # [phone]  Permission to leave phone message Yes  Some recent data might be hidden    No answer, left message to call if questions or concerns.

## 2016-06-12 ENCOUNTER — Encounter: Payer: Self-pay | Admitting: Gastroenterology

## 2016-06-16 MED FILL — LISINOPRIL 5 MG TABLET: 5 | 90 days supply | Qty: 90 | Fill #1

## 2016-06-16 MED FILL — BIMATOPROST 0.03% EYELASH S: 0.03 | 12 days supply | Qty: 3 | Fill #0

## 2016-06-20 ENCOUNTER — Telehealth: Payer: Self-pay | Admitting: Gastroenterology

## 2016-06-21 NOTE — Telephone Encounter (Signed)
Left a message for the patient to call. Her biopsy letter was mailed on 06/17/16. Call back to me if she has questions or has not received the letter.

## 2016-06-23 DIAGNOSIS — I10 Essential (primary) hypertension: Secondary | ICD-10-CM | POA: Diagnosis not present

## 2016-06-23 DIAGNOSIS — R238 Other skin changes: Secondary | ICD-10-CM | POA: Diagnosis not present

## 2016-07-18 DIAGNOSIS — R87610 Atypical squamous cells of undetermined significance on cytologic smear of cervix (ASC-US): Secondary | ICD-10-CM | POA: Diagnosis not present

## 2016-07-18 DIAGNOSIS — Z0142 Encounter for cervical smear to confirm findings of recent normal smear following initial abnormal smear: Secondary | ICD-10-CM | POA: Diagnosis not present

## 2016-08-17 DIAGNOSIS — L811 Chloasma: Secondary | ICD-10-CM | POA: Diagnosis not present

## 2016-08-29 MED FILL — ALPRAZolam 0.5 MG TABS: 0.5 | 30 days supply | Qty: 90 | Fill #0

## 2016-08-29 MED FILL — HYDROCHLOROTHIAZIDE 25 MG T: 25 | 90 days supply | Qty: 90 | Fill #0

## 2016-09-08 DIAGNOSIS — H52223 Regular astigmatism, bilateral: Secondary | ICD-10-CM | POA: Diagnosis not present

## 2016-09-08 DIAGNOSIS — H5213 Myopia, bilateral: Secondary | ICD-10-CM | POA: Diagnosis not present

## 2016-09-08 MED FILL — RESTASIS 0.05% EYE EMULSION: 0.05 | 90 days supply | Qty: 180 | Fill #0

## 2016-09-09 MED FILL — BIMATOPROST 0.03% EYELASH S: 0.03 | 12 days supply | Qty: 3 | Fill #1

## 2016-09-14 MED FILL — LISINOPRIL 5 MG TABLET: 5 | 90 days supply | Qty: 90 | Fill #0

## 2016-09-14 MED FILL — TRETINOIN 0.025% CREAM: 0.025 | 20 days supply | Qty: 45 | Fill #2

## 2016-10-04 MED FILL — PREMARIN 0.625 MG TABLET: 0.625 | 90 days supply | Qty: 90 | Fill #2

## 2016-11-02 MED FILL — ALPRAZolam 0.5 MG TABS: 0.5 | 30 days supply | Qty: 90 | Fill #0

## 2016-11-23 MED FILL — BIMATOPROST 0.03% EYELASH S: 0.03 | 12 days supply | Qty: 3 | Fill #0

## 2016-11-24 MED FILL — HYDROCHLOROTHIAZIDE 25 MG T: 25 | 90 days supply | Qty: 90 | Fill #1

## 2016-11-29 DIAGNOSIS — N644 Mastodynia: Secondary | ICD-10-CM | POA: Diagnosis not present

## 2016-11-29 MED FILL — HYDROCODON-APAP 5-325: 5-325 | 3 days supply | Qty: 20 | Fill #0

## 2016-12-01 DIAGNOSIS — N61 Mastitis without abscess: Secondary | ICD-10-CM | POA: Diagnosis not present

## 2016-12-05 MED FILL — LISINOPRIL 5 MG TABLET: 5 | 90 days supply | Qty: 90 | Fill #1

## 2016-12-21 ENCOUNTER — Ambulatory Visit (HOSPITAL_COMMUNITY)
Admission: EM | Admit: 2016-12-21 | Discharge: 2016-12-21 | Disposition: A | Discharge status: Home / Self Care | Payer: 59 | Attending: Internal Medicine | Admitting: Internal Medicine

## 2016-12-21 ENCOUNTER — Encounter (HOSPITAL_COMMUNITY): Payer: Self-pay | Admitting: Family Medicine

## 2016-12-21 DIAGNOSIS — W540XXA Bitten by dog, initial encounter: Secondary | ICD-10-CM | POA: Diagnosis not present

## 2016-12-21 DIAGNOSIS — S81851A Open bite, right lower leg, initial encounter: Secondary | ICD-10-CM

## 2016-12-21 MED ORDER — CLINDAMYCIN HCL 300 MG PO CAPS: 300.0000 mg | ORAL_CAPSULE | Freq: Three times a day (TID) | ORAL | 0 refills | Status: DC

## 2016-12-21 MED ORDER — DOXYCYCLINE HYCLATE 100 MG PO CAPS: 100.0000 mg | ORAL_CAPSULE | Freq: Two times a day (BID) | ORAL | 0 refills | Status: DC

## 2016-12-21 MED ORDER — METRONIDAZOLE 500 MG PO TABS: 500.0000 mg | ORAL_TABLET | Freq: Three times a day (TID) | ORAL | 0 refills | Status: DC

## 2016-12-21 MED FILL — CLINDAMYCIN HCL 300 MG CAP: 300 | 7 days supply | Qty: 21 | Fill #0

## 2016-12-21 MED FILL — DOXYCYCLINE HYCLATE 100 MG: 100 | 7 days supply | Qty: 14 | Fill #0

## 2016-12-21 NOTE — ED Triage Notes (Signed)
Pt here for dog bite to RLE. sts her dog and was bit by accident last night. sts up to date on all shots.

## 2016-12-21 NOTE — Discharge Instructions (Signed)
°  Try to keep off your right foot and keep elevated to help with swelling and help prevent increased tension on wound.    Due to having a penicillin allergy, it is recommended you be on two antibiotics for prophylaxis (to help prevent) infection from the dog bite.  If you develop fever, worsening pain, redness, swelling, or drainage or pus, please seek medical attention immediately for additional care.

## 2016-12-21 NOTE — ED Provider Notes (Signed)
CSN: [id number]     Arrival date & time 12/21/16  1303 History   First MD Initiated Contact with Patient 12/21/16 1338     Chief Complaint  Patient presents with  . Animal Bite   (Consider location/radiation/quality/duration/timing/severity/associated sxs/prior Treatment) HPI Monica Petty is a 51 y.o. female presenting to UC with c/o laceration to her Right lower leg that occurred last night while trying to train her 44mo old german shepard.  Pt notes her leg got in between the dog and the ball they were playing with. "It was not in an aggressive manner."  She reports cleaning it last night with alcohol and covering with a bandage.  Last Tdap was in 2011.  She is not on blood thinners. Dog is UTD on immunizations including rabies.    Past Medical History:  Diagnosis Date  . Allergy    spring allergies  . GERD (gastroesophageal reflux disease)   . Hypertension    Past Surgical History:  Procedure Laterality Date  . ABDOMINAL HYSTERECTOMY    . BREAST SURGERY     reduction  . CHOLECYSTECTOMY    . WISDOM TOOTH EXTRACTION     Family History  Problem Relation Age of Onset  . Colon polyps Mother   . Colon cancer Neg Hx    Social History  Substance Use Topics  . Smoking status: Never Smoker  . Smokeless tobacco: Never Used  . Alcohol use Yes     Comment: wine--daily 1-2 glasses a day    OB History    No data available     Review of Systems  Musculoskeletal: Negative for arthralgias, joint swelling and myalgias.  Skin: Positive for wound. Negative for color change.  Neurological: Negative for weakness and numbness.    Allergies  Codeine; Penicillins; and Sulfa antibiotics  Home Medications   Prior to Admission medications   Medication Sig Start Date End Date Taking? Authorizing Provider  ALPRAZolam Duanne Moron) 0.5 MG tablet Take 0.5 mg by mouth at bedtime.  11/26/15   Historical Provider, MD  clindamycin (CLEOCIN) 300 MG capsule Take 1 capsule (300 mg total) by mouth 3  (three) times daily. X 7 days 12/21/16   Noland Fordyce, PA-C  doxycycline (VIBRAMYCIN) 100 MG capsule Take 1 capsule (100 mg total) by mouth 2 (two) times daily. One po bid x 7 days 12/21/16   Noland Fordyce, PA-C  estradiol (ESTRACE) 0.1 MG/GM vaginal cream Place 1 Applicatorful vaginally 3 (three) times a week.    Historical Provider, MD  estrogens, conjugated, (PREMARIN) 0.625 MG tablet Take 0.625 mg by mouth daily.     Historical Provider, MD  hydrochlorothiazide (HYDRODIURIL) 25 MG tablet Take 25 mg by mouth daily.  02/25/16 02/24/17  Historical Provider, MD  Ibuprofen (ADVIL PO) Take by mouth.    Historical Provider, MD  lisinopril (PRINIVIL,ZESTRIL) 5 MG tablet Take 5 mg by mouth daily.  02/25/16   Historical Provider, MD  loratadine (CLARITIN) 10 MG tablet Take 10 mg by mouth.    Historical Provider, MD  Multiple Vitamins-Minerals (MULTIVITAMIN ADULT PO) Take by mouth.    Historical Provider, MD  Naproxen Sodium (ALEVE PO) Take by mouth.    Historical Provider, MD  OVER THE COUNTER MEDICATION 1 capsule 3 (three) times daily after meals. Okra pepsin    Historical Provider, MD  tretinoin (RETIN-A) 0.025 % gel Apply topically.    Historical Provider, MD   Meds Ordered and Administered this Visit  Medications - No data to display  There were  no vitals taken for this visit. No data found.   Physical Exam  Constitutional: She is oriented to person, place, and time. She appears well-developed and well-nourished. No distress.  HENT:  Head: Normocephalic and atraumatic.  Eyes: EOM are normal.  Neck: Normal range of motion.  Cardiovascular: Normal rate.   Pulmonary/Chest: Effort normal. No respiratory distress.  Musculoskeletal: Normal range of motion. She exhibits no edema or tenderness.       Right lower leg: She exhibits laceration.       Legs: Right lower leg, anterior aspect- laceration. No edema or tenderness to lower leg. Full ROM ankle and knee.   Neurological: She is alert and oriented to  person, place, and time.  Skin: Skin is warm and dry. She is not diaphoretic.  Right lower leg: 4in laceration, half superficial and lateral half deep, exposing adipose tissue. Bleeding controlled. No foreign bodies seen or palpated.   Psychiatric: She has a normal mood and affect. Her behavior is normal.  Nursing note and vitals reviewed.   Urgent Care Course     .Marland KitchenLaceration Repair Date/Time: 12/21/2016 2:45 PM Performed by: Noland Fordyce Authorized by: Sherlene Shams   Consent:    Consent obtained:  Verbal   Consent given by:  Patient   Risks discussed:  Infection, pain, poor cosmetic result and poor wound healing   Alternatives discussed:  No treatment Anesthesia (see MAR for exact dosages):    Anesthesia method:  None Laceration details:    Location:  Leg   Leg location:  R lower leg   Length (cm):  9   Depth (mm):  3 Repair type:    Repair type:  Simple Exploration:    Hemostasis achieved with:  Direct pressure   Wound exploration: wound explored through full range of motion and entire depth of wound probed and visualized     Wound extent: no nerve damage noted and no tendon damage noted     Contaminated: no   Treatment:    Amount of cleaning:  Standard   Irrigation solution:  Sterile saline   Irrigation method:  Syringe Skin repair:    Repair method:  Steri-Strips   Number of Steri-Strips:  2 Approximation:    Approximation:  Loose   Vermilion border: well-aligned   Post-procedure details:    Dressing:  Non-adherent dressing   Patient tolerance of procedure:  Tolerated well, no immediate complications   (including critical care time)  Labs Review Labs Reviewed - No data to display  Imaging Review No results found.   MDM   1. Dog bite of right lower leg, initial encounter    Laceration to Right lower leg. Steri-strips and bandage placed.   Pt allergic to PCN Rx: Clindamycin and Doxycycline for prophylaxis.   Encouraged f/u with PCP in 1 week  for wound recheck, sooner if concern for infection. Home care instructions provided.     Noland Fordyce, PA-C 12/21/16 1451

## 2016-12-28 DIAGNOSIS — I10 Essential (primary) hypertension: Secondary | ICD-10-CM | POA: Diagnosis not present

## 2016-12-29 DIAGNOSIS — Z1231 Encounter for screening mammogram for malignant neoplasm of breast: Secondary | ICD-10-CM | POA: Diagnosis not present

## 2016-12-29 DIAGNOSIS — I10 Essential (primary) hypertension: Secondary | ICD-10-CM | POA: Diagnosis not present

## 2016-12-29 DIAGNOSIS — Z01419 Encounter for gynecological examination (general) (routine) without abnormal findings: Secondary | ICD-10-CM | POA: Diagnosis not present

## 2016-12-29 DIAGNOSIS — Z6828 Body mass index (BMI) 28.0-28.9, adult: Secondary | ICD-10-CM | POA: Diagnosis not present

## 2016-12-30 ENCOUNTER — Other Ambulatory Visit: Payer: Self-pay | Admitting: Obstetrics & Gynecology

## 2016-12-30 ENCOUNTER — Other Ambulatory Visit: Payer: Self-pay | Admitting: Obstetrics and Gynecology

## 2016-12-30 DIAGNOSIS — R928 Other abnormal and inconclusive findings on diagnostic imaging of breast: Secondary | ICD-10-CM

## 2017-01-04 ENCOUNTER — Ambulatory Visit
Admission: RE | Admit: 2017-01-04 | Discharge: 2017-01-04 | Disposition: A | Discharge status: Home / Self Care | Payer: 59 | Source: Ambulatory Visit | Attending: Obstetrics & Gynecology | Admitting: Obstetrics & Gynecology

## 2017-01-04 ENCOUNTER — Other Ambulatory Visit: Payer: Self-pay | Admitting: Obstetrics & Gynecology

## 2017-01-04 DIAGNOSIS — R928 Other abnormal and inconclusive findings on diagnostic imaging of breast: Secondary | ICD-10-CM

## 2017-02-17 MED FILL — ALPRAZolam 0.5 MG TABS: 0.5 | 30 days supply | Qty: 90 | Fill #0

## 2017-02-17 MED FILL — BIMATOPROST 0.03% EYELASH S: 0.03 | 12 days supply | Qty: 3 | Fill #1

## 2017-02-17 MED FILL — PREMARIN 0.625 MG TABLET: 0.625 | 90 days supply | Qty: 90 | Fill #0

## 2017-02-22 MED FILL — HYDROCHLOROTHIAZIDE 25 MG T: 25 | 90 days supply | Qty: 90 | Fill #0

## 2017-03-16 MED FILL — LISINOPRIL 5 MG TABLET: 5 | 90 days supply | Qty: 90 | Fill #0

## 2017-04-06 MED FILL — ALPRAZolam 0.5 MG TABS: 0.5 | 30 days supply | Qty: 90 | Fill #1

## 2017-05-17 MED FILL — PREMARIN 0.625 MG TABLET: 0.625 | 90 days supply | Qty: 90 | Fill #1

## 2017-05-17 MED FILL — ALPRAZolam 0.5 MG TABS: 0.5 | 30 days supply | Qty: 90 | Fill #2

## 2017-05-23 MED FILL — HYDROCHLOROTHIAZIDE 25 MG T: 25 | 90 days supply | Qty: 90 | Fill #1

## 2017-05-30 MED FILL — TRETINOIN 0.025% CREAM: 0.025 | 30 days supply | Qty: 45 | Fill #0

## 2017-06-13 MED FILL — LISINOPRIL 5 MG TAB: 5 | 90 days supply | Qty: 90 | Fill #1

## 2017-07-05 DIAGNOSIS — H04123 Dry eye syndrome of bilateral lacrimal glands: Secondary | ICD-10-CM | POA: Insufficient documentation

## 2017-07-05 DIAGNOSIS — I10 Essential (primary) hypertension: Secondary | ICD-10-CM | POA: Diagnosis not present

## 2017-07-05 DIAGNOSIS — G4709 Other insomnia: Secondary | ICD-10-CM | POA: Insufficient documentation

## 2017-07-05 DIAGNOSIS — N951 Menopausal and female climacteric states: Secondary | ICD-10-CM | POA: Insufficient documentation

## 2017-08-04 MED FILL — ALPRAZolam 0.5 MG TABS: 0.5 | 30 days supply | Qty: 90 | Fill #0

## 2017-08-16 MED FILL — PREMARIN 0.625 MG TABLET: 0.625 | 90 days supply | Qty: 90 | Fill #2

## 2017-08-17 MED FILL — HYDROCODON-APAP 5-325: 5-325 | 2 days supply | Qty: 20 | Fill #0

## 2017-08-17 MED FILL — HYDROCHLOROTHIAZIDE 25 MG T: 25 | 90 days supply | Qty: 90 | Fill #0

## 2017-08-17 MED FILL — CLINDAMYCIN HCL 150 MG CAPS: 150 | 7 days supply | Qty: 28 | Fill #0

## 2017-08-23 MED FILL — RESTASIS 0.05% EYE EMULSION: 0.05 | 90 days supply | Qty: 180 | Fill #1

## 2017-09-11 MED FILL — LISINOPRIL 5 MG TAB: 5 | 90 days supply | Qty: 90 | Fill #0

## 2017-09-20 ENCOUNTER — Telehealth: Payer: 59 | Admitting: Family

## 2017-09-20 DIAGNOSIS — B9689 Other specified bacterial agents as the cause of diseases classified elsewhere: Secondary | ICD-10-CM | POA: Diagnosis not present

## 2017-09-20 DIAGNOSIS — J208 Acute bronchitis due to other specified organisms: Secondary | ICD-10-CM

## 2017-09-20 MED ORDER — AZITHROMYCIN 250 MG PO TABS: ORAL_TABLET | ORAL | 0 refills | Status: DC

## 2017-09-20 MED ORDER — BENZONATATE 100 MG PO CAPS: 100.0000 mg | ORAL_CAPSULE | Freq: Three times a day (TID) | ORAL | 0 refills | Status: DC | PRN

## 2017-09-20 MED FILL — AZITHROMYCIN 250 MG TAB: 250 | 5 days supply | Qty: 6 | Fill #0

## 2017-09-20 MED FILL — BENZONATATE 100 MG CAP: 100 | 6 days supply | Qty: 20 | Fill #0

## 2017-09-20 NOTE — Progress Notes (Signed)

## 2017-11-14 MED FILL — HYDROCHLOROTHIAZIDE 25 MG T: 25 | 90 days supply | Qty: 90 | Fill #1

## 2017-11-14 MED FILL — PREMARIN 0.625 MG TABLET: 0.625 | 90 days supply | Qty: 90 | Fill #3

## 2017-11-14 MED FILL — ALPRAZolam 0.5 MG TABS: 0.5 | 30 days supply | Qty: 90 | Fill #1

## 2017-12-11 MED FILL — LISINOPRIL 5 MG TABLET: 5 | 90 days supply | Qty: 90 | Fill #1

## 2017-12-29 ENCOUNTER — Ambulatory Visit: Payer: Self-pay

## 2017-12-29 ENCOUNTER — Other Ambulatory Visit: Payer: Self-pay | Admitting: Family Medicine

## 2017-12-29 DIAGNOSIS — M25511 Pain in right shoulder: Principal | ICD-10-CM

## 2017-12-29 DIAGNOSIS — G8929 Other chronic pain: Secondary | ICD-10-CM

## 2018-01-03 DIAGNOSIS — Z1321 Encounter for screening for nutritional disorder: Secondary | ICD-10-CM | POA: Diagnosis not present

## 2018-01-03 DIAGNOSIS — I10 Essential (primary) hypertension: Secondary | ICD-10-CM | POA: Diagnosis not present

## 2018-01-03 DIAGNOSIS — Z1329 Encounter for screening for other suspected endocrine disorder: Secondary | ICD-10-CM | POA: Diagnosis not present

## 2018-01-03 DIAGNOSIS — Z13220 Encounter for screening for lipoid disorders: Secondary | ICD-10-CM | POA: Diagnosis not present

## 2018-01-04 DIAGNOSIS — I10 Essential (primary) hypertension: Secondary | ICD-10-CM | POA: Diagnosis not present

## 2018-01-04 DIAGNOSIS — Z1329 Encounter for screening for other suspected endocrine disorder: Secondary | ICD-10-CM | POA: Diagnosis not present

## 2018-01-04 DIAGNOSIS — Z13220 Encounter for screening for lipoid disorders: Secondary | ICD-10-CM | POA: Diagnosis not present

## 2018-01-04 DIAGNOSIS — Z1321 Encounter for screening for nutritional disorder: Secondary | ICD-10-CM | POA: Diagnosis not present

## 2018-01-11 ENCOUNTER — Other Ambulatory Visit: Payer: Self-pay

## 2018-01-11 ENCOUNTER — Encounter: Payer: Self-pay | Admitting: Physical Therapy

## 2018-01-11 ENCOUNTER — Ambulatory Visit: Payer: PRIVATE HEALTH INSURANCE | Attending: Specialist | Admitting: Physical Therapy

## 2018-01-11 DIAGNOSIS — M6281 Muscle weakness (generalized): Secondary | ICD-10-CM | POA: Diagnosis present

## 2018-01-11 DIAGNOSIS — M25511 Pain in right shoulder: Secondary | ICD-10-CM | POA: Insufficient documentation

## 2018-01-11 DIAGNOSIS — R29898 Other symptoms and signs involving the musculoskeletal system: Secondary | ICD-10-CM | POA: Insufficient documentation

## 2018-01-11 NOTE — Therapy (Signed)
[facility] [address]  Monica Petty, [place], [address] Phone: [phone]   Fax:  [phone]  Physical Therapy Evaluation  Patient Details  Name: Monica Petty MRN: [id number] Date of Birth: 09-Aug-1966 Referring Provider: Dr. Theda Petty   Encounter Date: 01/11/2018  PT End of Session - 01/11/18 1729    Visit Number  1    Number of Visits  8    Date for PT Re-Evaluation  02/08/18    Authorization Type  WC    PT Start Time  1527    PT Stop Time  1605    PT Time Calculation (min)  38 min    Activity Tolerance  Patient tolerated treatment well    Behavior During Therapy  [facility] for tasks assessed/performed       Past Medical History:  Diagnosis Date  . Allergy    spring allergies  . GERD (gastroesophageal reflux disease)   . Hypertension     Past Surgical History:  Procedure Laterality Date  . ABDOMINAL HYSTERECTOMY    . BREAST SURGERY     reduction  . CHOLECYSTECTOMY    . WISDOM TOOTH EXTRACTION      There were no vitals filed for this visit.   Subjective Assessment - 01/11/18 1527    Subjective  Patient reporting injury at work 12/12/17 - had to hold down a patient. Pain with certian movements. lifting as well as rotation at shoulder is painful. 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 Denies N&T into arm. Did do a steroid injection - no relief yet.     Diagnostic tests  xray - arthritis per patient    Patient Stated Goals  improve pain, return to work    Currently in Pain?  No/denies    Pain Score  0-No pain 4/27 with certain movements    Pain Location  Shoulder    Pain Orientation  Right    Pain Descriptors / Indicators  Sharp;Stabbing    Pain Type  Acute pain         [facility] PT Assessment - 01/11/18 1532      Assessment   Medical Diagnosis  strain of rotator cuff capsule - R    Referring Provider  Dr. Theda Petty    Onset Date/Surgical Date  12/12/17     Next MD Visit  02/05/18    Prior Therapy  no      Precautions   Precautions  Shoulder    Type of Shoulder Precautions  lifting - no greater than 10#      Restrictions   Weight Bearing Restrictions  No      Balance Screen   Has the patient fallen in the past 6 months  No    Has the patient had a decrease in activity level because of a fear of falling?   No    Is the patient reluctant to leave their home because of a fear of falling?   No      Prior Function   Level of Independence  Independent    Vocation  Full time employment    Vocation Requirements  OR nurse - lifting, pushing, lifitng patients      Cognition   Overall Cognitive Status  Within Functional Limits for tasks assessed      Observation/Other Assessments   Focus on Therapeutic Outcomes (FOTO)   Shoulder: 52 (48% limited, predicted 37%  limited)      Sensation   Light Touch  Appears Intact      Coordination   Gross Motor Movements are Fluid and Coordinated  Yes      Posture/Postural Control   Posture/Postural Control  No significant limitations      ROM / Strength   AROM / PROM / Strength  AROM;Strength      AROM   AROM Assessment Site  Shoulder    Right/Left Shoulder  Right    Right Shoulder Flexion  160 Degrees    Right Shoulder ABduction  165 Degrees    Right Shoulder Internal Rotation  -- FIR ~T10    Right Shoulder External Rotation  -- FER ~C6      Strength   Strength Assessment Site  Shoulder    Right/Left Shoulder  Right;Left    Right Shoulder Flexion  4-/5    Right Shoulder ABduction  4-/5    Right Shoulder Internal Rotation  4/5    Right Shoulder External Rotation  4-/5    Left Shoulder Flexion  4+/5    Left Shoulder ABduction  4+/5    Left Shoulder Internal Rotation  4+/5    Left Shoulder External Rotation  4+/5      Palpation   Palpation comment  TTP along ER group at posterior shoulder                Objective measurements completed on examination: See above findings.       [facility] Adult PT Treatment/Exercise - 01/11/18 1532      Exercises   Exercises  Shoulder      Shoulder Exercises: Seated   Horizontal ABduction  Strengthening;Both;5 reps;Theraband    Theraband Level (Shoulder Horizontal ABduction)  Level 1 (Yellow)    External Rotation  Strengthening;Both;5 reps;Theraband    Theraband Level (Shoulder External Rotation)  Level 1 (Yellow)      Shoulder Exercises: Standing   External Rotation  Strengthening;Right;5 reps;Theraband isometric stepouts    Theraband Level (Shoulder External Rotation)  Level 1 (Yellow)    Internal Rotation  Strengthening;Right;5 reps;Theraband isometric step outs    Theraband Level (Shoulder Internal Rotation)  Level 1 (Yellow)    Row  Strengthening;Both;10 reps;Theraband    Theraband Level (Shoulder Row)  Level 1 (Yellow)             PT Education - 01/11/18 1729    Education provided  Yes    Education Details  exam findings, POC, HEP    Person(s) Educated  Patient    Methods  Explanation;Demonstration;Handout    Comprehension  Verbalized understanding;Returned demonstration          PT Long Term Goals - 01/11/18 1733      PT LONG TERM GOAL #1   Title  patient to be independent with advanced HEP    Status  New    Target Date  02/08/18      PT LONG TERM GOAL #2   Title  patient to demonstrate full and non-painful AROM (including multi-planar motions) without pain    Status  New    Target Date  02/08/18      PT LONG TERM GOAL #3   Title  patient to improve R shoulder strength to >/= 4+/5 without pain    Status  New    Target Date  02/08/18      PT LONG TERM GOAL #4   Title  patient to demosntrate appropriate posture and body mechanics as it relates  to her daily activities    Status  New    Target Date  02/08/18      PT LONG TERM GOAL #5   Title  patient to report ability to return to full-time work without pain or risk of re-injury    Status  New    Target Date  02/08/18              Plan - 01/11/18 1730    Clinical Impression Statement  Monica Petty is a pleasant 52 y/o female presenting to [facility] today regarding primary complaints of R shoulder pain following an injury at work where she needed to restrain a patient. Patient with full AROM - however pain with multiplanar motions. Strength also reduced as complared to L shoulder with pain provocation mostly in resisted abduction and ER. Patient given initial HEP for gentle strengthening with good tolerance and carryover. Patient to benefit from skilled PT intervention to address pain and functional limitations at R shoulder for hopeful return to work with reduced risk of re-injury.     Clinical Presentation  Stable    Clinical Decision Making  Low    Rehab Potential  Good    PT Frequency  2x / week    PT Duration  4 weeks    PT Treatment/Interventions  ADLs/Self Care Home Management;Cryotherapy;Electrical Stimulation;Iontophoresis 4mg /ml Dexamethasone;Moist Heat;Therapeutic exercise;Therapeutic activities;Neuromuscular re-education;Patient/family education;Manual techniques;Vasopneumatic Device;Taping;Dry needling;Passive range of motion    Consulted and Agree with Plan of Care  Patient       Patient will benefit from skilled therapeutic intervention in order to improve the following deficits and impairments:  Pain, Impaired UE functional use, Decreased strength, Decreased range of motion, Decreased activity tolerance  Visit Diagnosis: Acute pain of right shoulder  Muscle weakness (generalized)  Other symptoms and signs involving the musculoskeletal system     Problem List There are no active problems to display for this patient.    Lanney Gins, PT, DPT 01/11/18 5:36 PM   [facility] [address]  [place], [place], [address] Phone: [phone]   Fax:  [phone]  Name: Monica Petty MRN: [id number] Date of Birth:  15-Dec-1965

## 2018-01-11 NOTE — Patient Instructions (Addendum)
Resisted Horizontal Abduction: Bilateral   Sit or stand, tubing in both hands, arms out in front. Keeping arms straight, pinch shoulder blades together and stretch arms out. Repeat __10-15__ times per set.   Shoulder: External rotation   Sit or stand, tubing in both hands, elbows at sides, bent to 90, forearms forward. Pinch shoulder blades together and rotate forearms out. Keep elbows at sides. Repeat __10-15__ times per set.   Resistive Band Rowing   With resistive band anchored in door, grasp both ends. Keeping elbows bent, pull back, squeezing shoulder blades together. Hold __3-5__ seconds. Repeat __10-15__ times.    Isometric Step outs  - keeping wrist strong and thumb up - step out from door - face both ways x 10 reps each direction  Trigger Point Dry Needling   . What is Trigger Point Dry Needling (DN)? o DN is a physical therapy technique used to treat muscle pain and dysfunction. Specifically, DN helps deactivate muscle trigger points (muscle knots).  o A thin filiform needle is used to penetrate the skin and stimulate the underlying trigger point. The goal is for a local twitch response (LTR) to occur and for the trigger point to relax. No medication of any kind is injected during the procedure.   . What Does Trigger Point Dry Needling Feel Like?  o The procedure feels different for each individual patient. Some patients report that they do not actually feel the needle enter the skin and overall the process is not painful. Very mild bleeding may occur. However, many patients feel a deep cramping in the muscle in which the needle was inserted. This is the local twitch response.   Marland Kitchen How Will I feel after the treatment? o Soreness is normal, and the onset of soreness may not occur for a few hours. Typically this soreness does not last longer than two days.  o Bruising is uncommon, however; ice can be used to decrease any possible bruising.  o In rare cases feeling tired or  nauseous after the treatment is normal. In addition, your symptoms may get worse before they get better, this period will typically not last longer than 24 hours.   . What Can I do After My Treatment? o Increase your hydration by drinking more water for the next 24 hours. o You may place ice or heat on the areas treated that have become sore, however, do not use heat on inflamed or bruised areas. Heat often brings more relief post needling. o You can continue your regular activities, but vigorous activity is not recommended initially after the treatment for 24 hours. o DN is best combined with other physical therapy such as strengthening, stretching, and other therapies.

## 2018-01-16 ENCOUNTER — Ambulatory Visit: Payer: PRIVATE HEALTH INSURANCE | Admitting: Physical Therapy

## 2018-01-16 ENCOUNTER — Encounter: Payer: Self-pay | Admitting: Physical Therapy

## 2018-01-16 DIAGNOSIS — M6281 Muscle weakness (generalized): Secondary | ICD-10-CM

## 2018-01-16 DIAGNOSIS — M25511 Pain in right shoulder: Secondary | ICD-10-CM

## 2018-01-16 DIAGNOSIS — R29898 Other symptoms and signs involving the musculoskeletal system: Secondary | ICD-10-CM

## 2018-01-16 NOTE — Therapy (Signed)
[name] [address]  [place], [place], [address] Phone: [phone]   Fax:  [phone]  Physical Therapy Treatment  Patient Details  Name: Monica Petty MRN: [id number] Date of Birth: Aug 04, 1966 Referring Provider: Dr. Theda Sers   Encounter Date: 01/16/2018  PT End of Session - 01/16/18 0814    Visit Number  2    Number of Visits  8    Date for PT Re-Evaluation  02/08/18    Authorization Type  WC    PT Start Time  0812 pt late    PT Stop Time  0846    PT Time Calculation (min)  34 min    Activity Tolerance  Patient tolerated treatment well    Behavior During Therapy  [facility] for tasks assessed/performed       Past Medical History:  Diagnosis Date  . Allergy    spring allergies  . GERD (gastroesophageal reflux disease)   . Hypertension     Past Surgical History:  Procedure Laterality Date  . ABDOMINAL HYSTERECTOMY    . BREAST SURGERY     reduction  . CHOLECYSTECTOMY    . WISDOM TOOTH EXTRACTION      There were no vitals filed for this visit.  Subjective Assessment - 01/16/18 0813    Subjective  doing well - shoulder is about the same    Diagnostic tests  xray - arthritis per patient    Patient Stated Goals  improve pain, return to work    Currently in Pain?  No/denies    Pain Score  0-No pain only doing certain movements                       [facility] Adult PT Treatment/Exercise - 01/16/18 0815      Shoulder Exercises: Supine   Other Supine Exercises  serratus punch - RUE - 3# x 15      Shoulder Exercises: Sidelying   External Rotation  Strengthening;Right;15 reps;Weights    External Rotation Weight (lbs)  1    Flexion  AROM;Right;15 reps    ABduction  -- not tolerable      Shoulder Exercises: Standing   External Rotation  Strengthening;Right;12 reps;Theraband    Theraband Level (Shoulder External Rotation)  Level 1 (Yellow)    Internal Rotation  Strengthening;Right;12  reps;Theraband    Theraband Level (Shoulder Internal Rotation)  Level 1 (Yellow)      Shoulder Exercises: ROM/Strengthening   UBE (Upper Arm Bike)  L2 x 6 min    Wall Wash  abduction - 2# at wrist x 12 reps    Proximal Shoulder Strengthening, Supine  R shoulder - proximal - 4 x 30 sec    Ball on Wall  walking orange pball up wall - 2# at wrist x 10      Manual Therapy   Manual Therapy  Joint mobilization    Manual therapy comments  patient supine    Joint Mobilization  R GH inferior grade II-III mobs 5 x 20 sec                   PT Long Term Goals - 01/16/18 7628      PT LONG TERM GOAL #1   Title  patient to be independent with advanced HEP    Status  On-going      PT LONG TERM GOAL #2   Title  patient to demonstrate full and non-painful AROM (including  multi-planar motions) without pain    Status  On-going      PT LONG TERM GOAL #3   Title  patient to improve R shoulder strength to >/= 4+/5 without pain    Status  On-going      PT LONG TERM GOAL #4   Title  patient to demosntrate appropriate posture and body mechanics as it relates to her daily activities    Status  On-going      PT LONG TERM GOAL #5   Title  patient to report ability to return to full-time work without pain or risk of re-injury    Status  On-going            Plan - 01/16/18 0814    Clinical Impression Statement  Mardene doing well today - reporting good compliance with HEP. Patient today not tolerable to sidelying abduction, but able to tolerate all otehr strengthening and AROM activities. Does have some "popping" in R shoulder with standing abduction of unknown origin. Updated HEP to include active IR/ER with yellow tband with good tolerance.     PT Treatment/Interventions  ADLs/Self Care Home Management;Cryotherapy;Electrical Stimulation;Iontophoresis 4mg /ml Dexamethasone;Moist Heat;Therapeutic exercise;Therapeutic activities;Neuromuscular re-education;Patient/family education;Manual  techniques;Vasopneumatic Device;Taping;Dry needling;Passive range of motion    Consulted and Agree with Plan of Care  Patient       Patient will benefit from skilled therapeutic intervention in order to improve the following deficits and impairments:  Pain, Impaired UE functional use, Decreased strength, Decreased range of motion, Decreased activity tolerance  Visit Diagnosis: Acute pain of right shoulder  Muscle weakness (generalized)  Other symptoms and signs involving the musculoskeletal system     Problem List There are no active problems to display for this patient.    Lanney Gins, PT, DPT 01/16/18 12:03 PM   [facility] [address]  [place], [place], [address] Phone: [phone]   Fax:  [phone]  Name: Monica Petty MRN: [id number] Date of Birth: [birth year]

## 2018-01-19 ENCOUNTER — Ambulatory Visit: Payer: PRIVATE HEALTH INSURANCE | Attending: Specialist | Admitting: Physical Therapy

## 2018-01-19 ENCOUNTER — Encounter: Payer: Self-pay | Admitting: Physical Therapy

## 2018-01-19 DIAGNOSIS — M25511 Pain in right shoulder: Secondary | ICD-10-CM | POA: Insufficient documentation

## 2018-01-19 DIAGNOSIS — R29898 Other symptoms and signs involving the musculoskeletal system: Secondary | ICD-10-CM | POA: Insufficient documentation

## 2018-01-19 DIAGNOSIS — M6281 Muscle weakness (generalized): Secondary | ICD-10-CM | POA: Insufficient documentation

## 2018-01-19 NOTE — Therapy (Addendum)
[facility] [address]  [place], [place], [address] Phone: [phone]   Fax:  [phone]  Physical Therapy Treatment  Patient Details  Name: Monica Petty MRN: [id number] Date of Birth: May 16, 1966 Referring Provider: Dr. Theda Sers   Encounter Date: 01/19/2018  PT End of Session - 01/19/18 1011    Visit Number  3    Number of Visits  8    Date for PT Re-Evaluation  02/08/18    Authorization Type  WC    PT Start Time  0757    PT Stop Time  0900    PT Time Calculation (min)  63 min    Activity Tolerance  Patient tolerated treatment well    Behavior During Therapy  [facility] for tasks assessed/performed       Past Medical History:  Diagnosis Date  . Allergy    spring allergies  . GERD (gastroesophageal reflux disease)   . Hypertension     Past Surgical History:  Procedure Laterality Date  . ABDOMINAL HYSTERECTOMY    . BREAST SURGERY     reduction  . CHOLECYSTECTOMY    . WISDOM TOOTH EXTRACTION      There were no vitals filed for this visit.  Subjective Assessment - 01/19/18 0802    Subjective  Patient says the R shoulder pain is okay today; no better, no worse.     Diagnostic tests  xray - arthritis per patient    Patient Stated Goals  improve pain, return to work    Currently in Pain?  No/denies                       [facility] Adult PT Treatment/Exercise - 01/19/18 0001      Shoulder Exercises: Supine   Other Supine Exercises  serratus punch - RUE - 3# x 15    Other Supine Exercises  Scaption as tolerated 10x       Shoulder Exercises: Seated   Horizontal ABduction  Strengthening;Both;Theraband;15 reps;Limitations    Theraband Level (Shoulder Horizontal ABduction)  Level 1 (Yellow)    Horizontal ABduction Limitations  Verbal cues for scapular retraction      Shoulder Exercises: Sidelying   External Rotation  Strengthening;Right;Weights;10 reps;Limitations    External Rotation  Weight (lbs)  1    External Rotation Limitations  2x; R UE with towel under elbow Not able to tolerate 2#; reports of burning in pos delt    ABduction  AROM;10 reps;Limitations    ABduction Limitations  2x R UE; could not tolerate 1#      Shoulder Exercises: Standing   Extension  15 reps;Theraband;Strengthening;Right;Left    Theraband Level (Shoulder Extension)  Level 1 (Yellow)    Other Standing Exercises  R UE ER/IR isometric at door frame; 30% effort 10x10 sec each    Other Standing Exercises  R UE green ball rotations on wall; 15x each clockwise/counterclockwise      Shoulder Exercises: ROM/Strengthening   UBE (Upper Arm Bike)  L1 x 3/3      Modalities   Modalities  Electrical Stimulation      Electrical Stimulation   Electrical Stimulation Location  R posterior shoulder    Electrical Stimulation Action  IFC    Electrical Stimulation Parameters  To tolerance    Electrical Stimulation Goals  Pain                  PT Long Term Goals - 01/16/18  0814      PT LONG TERM GOAL #1   Title  patient to be independent with advanced HEP    Status  On-going      PT LONG TERM GOAL #2   Title  patient to demonstrate full and non-painful AROM (including multi-planar motions) without pain    Status  On-going      PT LONG TERM GOAL #3   Title  patient to improve R shoulder strength to >/= 4+/5 without pain    Status  On-going      PT LONG TERM GOAL #4   Title  patient to demosntrate appropriate posture and body mechanics as it relates to her daily activities    Status  On-going      PT LONG TERM GOAL #5   Title  patient to report ability to return to full-time work without pain or risk of re-injury    Status  On-going            Plan - 01/19/18 0816    Clinical Impression Statement  Patient arrived to clinic with reports that symptoms in R UE no better/no worse. No audible "poping/crunching" of R shoulder this date; able to tolerate slow sidelying R shoulder ABD.  After completing, reported 6/10 burning discomfort in R posterior delt. Tolerated progressive scapular stabilization exercises with verbal cues to correct form. Applied e-stim to R posterior shoulder for pain management- patient tolerated well and with normal integumentary response at end of session.    PT Treatment/Interventions  ADLs/Self Care Home Management;Cryotherapy;Electrical Stimulation;Iontophoresis 4mg /ml Dexamethasone;Moist Heat;Therapeutic exercise;Therapeutic activities;Neuromuscular re-education;Patient/family education;Manual techniques;Vasopneumatic Device;Taping;Dry needling;Passive range of motion    PT Next Visit Plan  Reasses sidelying R shoulder abduction tolerance     Consulted and Agree with Plan of Care  Patient       Patient will benefit from skilled therapeutic intervention in order to improve the following deficits and impairments:  Pain, Impaired UE functional use, Decreased strength, Decreased range of motion, Decreased activity tolerance  Visit Diagnosis: Acute pain of right shoulder  Muscle weakness (generalized)  Other symptoms and signs involving the musculoskeletal system     Problem List There are no active problems to display for this patient.   Janene Harvey, PT, DPT 01/19/18 10:28 AM   [facility] [place], [place], [address] Phone: [phone]   Fax:  [phone]  Name: Monica Petty MRN: [id number] Date of Birth: 1965/11/25

## 2018-01-22 DIAGNOSIS — Z1231 Encounter for screening mammogram for malignant neoplasm of breast: Secondary | ICD-10-CM | POA: Diagnosis not present

## 2018-01-22 DIAGNOSIS — Z6831 Body mass index (BMI) 31.0-31.9, adult: Secondary | ICD-10-CM | POA: Diagnosis not present

## 2018-01-22 DIAGNOSIS — Z01419 Encounter for gynecological examination (general) (routine) without abnormal findings: Secondary | ICD-10-CM | POA: Diagnosis not present

## 2018-01-23 ENCOUNTER — Other Ambulatory Visit: Payer: Self-pay | Admitting: Obstetrics & Gynecology

## 2018-01-23 ENCOUNTER — Ambulatory Visit: Payer: PRIVATE HEALTH INSURANCE

## 2018-01-23 DIAGNOSIS — M25511 Pain in right shoulder: Secondary | ICD-10-CM | POA: Diagnosis not present

## 2018-01-23 DIAGNOSIS — M6281 Muscle weakness (generalized): Secondary | ICD-10-CM

## 2018-01-23 DIAGNOSIS — R29898 Other symptoms and signs involving the musculoskeletal system: Secondary | ICD-10-CM

## 2018-01-23 DIAGNOSIS — R928 Other abnormal and inconclusive findings on diagnostic imaging of breast: Secondary | ICD-10-CM

## 2018-01-23 NOTE — Patient Instructions (Signed)

## 2018-01-23 NOTE — Therapy (Signed)
[facility] [address]  [place], [place], [address] Phone: [phone]   Fax:  [phone]  Physical Therapy Treatment  Patient Details  Name: Monica Petty MRN: [id number] Date of Birth: 01-02-1966 Referring Provider: Dr. Theda Sers   Encounter Date: 01/23/2018  PT End of Session - 01/23/18 1712    Visit Number  4    Number of Visits  8    Date for PT Re-Evaluation  02/08/18    Authorization Type  WC    PT Start Time  5465    PT Stop Time  1743    PT Time Calculation (min)  38 min    Activity Tolerance  Patient tolerated treatment well    Behavior During Therapy  [facility] for tasks assessed/performed       Past Medical History:  Diagnosis Date  . Allergy    spring allergies  . GERD (gastroesophageal reflux disease)   . Hypertension     Past Surgical History:  Procedure Laterality Date  . ABDOMINAL HYSTERECTOMY    . BREAST SURGERY     reduction  . CHOLECYSTECTOMY    . WISDOM TOOTH EXTRACTION      There were no vitals filed for this visit.  Subjective Assessment - 01/23/18 1708    Subjective  Felt good relief from E-stim last visit.      Diagnostic tests  xray - arthritis per patient    Patient Stated Goals  improve pain, return to work    Currently in Pain?  No/denies    Pain Score  0-No pain turning steering wheel to L     Multiple Pain Sites  No                       [facility] Adult PT Treatment/Exercise - 01/23/18 1715      Shoulder Exercises: Supine   Protraction  Right;15 reps;Weights 2 sets     Protraction Weight (lbs)  3      Shoulder Exercises: Sidelying   External Rotation  Right;Weights;10 reps;Strengthening    External Rotation Weight (lbs)  2    ABduction  AROM;15 reps    Other Sidelying Exercises  R shoulder circles CW, CCW in sustained abd 90 dg position x 10 reps each way       Shoulder Exercises: Standing   External Rotation  15 reps;Right;Theraband bolster  under elbow     Theraband Level (Shoulder External Rotation)  Level 1 (Yellow)    Internal Rotation  Right;15 reps;Strengthening;Theraband bolster under elbow    Theraband Level (Shoulder Internal Rotation)  Level 1 (Yellow)    Flexion  Right;15 reps;Weights;AAROM    Shoulder Flexion Weight (lbs)  1    Flexion Limitations  wall ladder     ABduction  Right;10 reps;AAROM;Weights    Shoulder ABduction Weight (lbs)  1    ABduction Limitations  wall ladder     Extension  15 reps;Theraband;Strengthening;Both    Theraband Level (Shoulder Extension)  Level 1 (Yellow)    Extension Limitations  cues for full scap. squeeze     Row  Both;10 reps;Theraband;Strengthening    Theraband Level (Shoulder Row)  Level 2 (Red)      Shoulder Exercises: ROM/Strengthening   UBE (Upper Arm Bike)  L1 x 3/3      Modalities   Modalities  Iontophoresis      Iontophoresis   Type of Iontophoresis  Dexamethasone    Location  R  posterior/lateral shoulder     Dose  93mA-min, 1.0 ml,    Time  4-6 hours wear time              PT Education - 01/23/18 1802    Education provided  Yes    Education Details  iontophoresis educational handout; pt. verbalized understanding     Person(s) Educated  Patient    Methods  Explanation;Demonstration;Verbal cues;Handout    Comprehension  Verbalized understanding;Returned demonstration;Verbal cues required;Need further instruction          PT Long Term Goals - 01/16/18 7893      PT LONG TERM GOAL #1   Title  patient to be independent with advanced HEP    Status  On-going      PT LONG TERM GOAL #2   Title  patient to demonstrate full and non-painful AROM (including multi-planar motions) without pain    Status  On-going      PT LONG TERM GOAL #3   Title  patient to improve R shoulder strength to >/= 4+/5 without pain    Status  On-going      PT LONG TERM GOAL #4   Title  patient to demosntrate appropriate posture and body mechanics as it relates to her daily  activities    Status  On-going      PT LONG TERM GOAL #5   Title  patient to report ability to return to full-time work without pain or risk of re-injury    Status  On-going            Plan - 01/23/18 1713    Clinical Impression Statement  Pt. doing well today.  Reports good benefit from E-stim performed last visit.  Tolerated gentle progression of RTC/scapular strengthening activities well today.  Able to tolerated progression of ER strengthening activities with some cueing to avoid painful ranges of motion.  MD signed order for iontophoresis thus initiated ionto patch #1/6 to R posterior/lateral shoulder for hopeful pain relief in this area.  Will monitor response and continue to progress in coming visits.      PT Treatment/Interventions  ADLs/Self Care Home Management;Cryotherapy;Electrical Stimulation;Iontophoresis 4mg /ml Dexamethasone;Moist Heat;Therapeutic exercise;Therapeutic activities;Neuromuscular re-education;Patient/family education;Manual techniques;Vasopneumatic Device;Taping;Dry needling;Passive range of motion    Consulted and Agree with Plan of Care  Patient       Patient will benefit from skilled therapeutic intervention in order to improve the following deficits and impairments:  Pain, Impaired UE functional use, Decreased strength, Decreased range of motion, Decreased activity tolerance  Visit Diagnosis: Acute pain of right shoulder  Muscle weakness (generalized)  Other symptoms and signs involving the musculoskeletal system     Problem List There are no active problems to display for this patient.   Bess Harvest, PTA 01/23/18 6:06 PM  [facility] [address]  [place], [place], [address] Phone: [phone]   Fax:  [phone]  Name: Monica Petty MRN: [id number] Date of Birth: 10/03/1965

## 2018-01-24 ENCOUNTER — Ambulatory Visit
Admission: RE | Admit: 2018-01-24 | Discharge: 2018-01-24 | Disposition: A | Discharge status: Home / Self Care | Payer: 59 | Source: Ambulatory Visit | Attending: Obstetrics & Gynecology | Admitting: Obstetrics & Gynecology

## 2018-01-24 ENCOUNTER — Other Ambulatory Visit: Payer: Self-pay | Admitting: Obstetrics & Gynecology

## 2018-01-24 DIAGNOSIS — R922 Inconclusive mammogram: Secondary | ICD-10-CM | POA: Diagnosis not present

## 2018-01-24 DIAGNOSIS — N6001 Solitary cyst of right breast: Secondary | ICD-10-CM | POA: Diagnosis not present

## 2018-01-24 DIAGNOSIS — N6320 Unspecified lump in the left breast, unspecified quadrant: Secondary | ICD-10-CM

## 2018-01-24 DIAGNOSIS — N6489 Other specified disorders of breast: Secondary | ICD-10-CM | POA: Diagnosis not present

## 2018-01-24 DIAGNOSIS — R928 Other abnormal and inconclusive findings on diagnostic imaging of breast: Secondary | ICD-10-CM

## 2018-01-26 ENCOUNTER — Ambulatory Visit: Payer: PRIVATE HEALTH INSURANCE

## 2018-01-26 DIAGNOSIS — M6281 Muscle weakness (generalized): Secondary | ICD-10-CM

## 2018-01-26 DIAGNOSIS — R29898 Other symptoms and signs involving the musculoskeletal system: Secondary | ICD-10-CM

## 2018-01-26 DIAGNOSIS — M25511 Pain in right shoulder: Secondary | ICD-10-CM | POA: Diagnosis not present

## 2018-01-26 NOTE — Therapy (Signed)
[facility] [address]  [place], [place], [address] Phone: [phone]   Fax:  [phone]  Physical Therapy Treatment  Patient Details  Name: Monica Petty MRN: [id number] Date of Birth: August 29, 1966 Referring Provider: Dr. Theda Sers   Encounter Date: 01/26/2018  PT End of Session - 01/26/18 0803    Visit Number  5    Number of Visits  8    Date for PT Re-Evaluation  02/08/18    Authorization Type  WC    PT Start Time  0801    PT Stop Time  0840    PT Time Calculation (min)  39 min    Activity Tolerance  Patient tolerated treatment well    Behavior During Therapy  [facility] for tasks assessed/performed       Past Medical History:  Diagnosis Date  . Allergy    spring allergies  . GERD (gastroesophageal reflux disease)   . Hypertension     Past Surgical History:  Procedure Laterality Date  . ABDOMINAL HYSTERECTOMY    . BREAST SURGERY     reduction  . CHOLECYSTECTOMY    . REDUCTION MAMMAPLASTY    . WISDOM TOOTH EXTRACTION      There were no vitals filed for this visit.  Subjective Assessment - 01/26/18 0803    Subjective  Felt fine after last visit with no soreness.      Diagnostic tests  xray - arthritis per patient    Patient Stated Goals  improve pain, return to work    Currently in Pain?  No/denies    Pain Score  0-No pain    Multiple Pain Sites  No                       [facility] Adult PT Treatment/Exercise - 01/26/18 0808      Shoulder Exercises: Prone   Retraction  Right;10 reps;Weights    Retraction Weight (lbs)  2    Retraction Limitations  row - elbow to neutral     Extension  Right;10 reps    Extension Limitations  to neutral       Shoulder Exercises: Sidelying   External Rotation  Right;Weights;10 reps;Strengthening    External Rotation Weight (lbs)  2    External Rotation Limitations  Cues required for scapular squeeze     ABduction  AROM;10 reps;Weights    ABduction Weight (lbs)  1    Other Sidelying Exercises  R shoulder horizontal abduction from bolster on mat table x 10 reps       Shoulder Exercises: Standing   External Rotation  15 reps;Right;Theraband 3" return     Theraband Level (Shoulder External Rotation)  Level 1 (Yellow)    External Rotation Limitations  bolster under elbow    Internal Rotation  Right;10 reps;Theraband    Theraband Level (Shoulder Internal Rotation)  Level 2 (Red)    Internal Rotation Limitations  bolster under elbow    Flexion  Right;AAROM;10 reps    Flexion Limitations  orange p-ball roll on wall     ABduction  Right;10 reps;AAROM    ABduction Limitations  scaption; orange p-ball roll on wall     Extension  Both;10 reps;Theraband;Strengthening    Theraband Level (Shoulder Extension)  Level 2 (Red)    Extension Limitations  cues for full scap. squeeze     Row  Both;Theraband;Strengthening;15 reps    Theraband Level (Shoulder Row)  Level 2 (Red)  Shoulder Exercises: ROM/Strengthening   UBE (Upper Arm Bike)  L2 x 3/3                  PT Long Term Goals - 01/16/18 1007      PT LONG TERM GOAL #1   Title  patient to be independent with advanced HEP    Status  On-going      PT LONG TERM GOAL #2   Title  patient to demonstrate full and non-painful AROM (including multi-planar motions) without pain    Status  On-going      PT LONG TERM GOAL #3   Title  patient to improve R shoulder strength to >/= 4+/5 without pain    Status  On-going      PT LONG TERM GOAL #4   Title  patient to demosntrate appropriate posture and body mechanics as it relates to her daily activities    Status  On-going      PT LONG TERM GOAL #5   Title  patient to report ability to return to full-time work without pain or risk of re-injury    Status  On-going            Plan - 01/26/18 0806    Clinical Impression Statement  Jernie reporting she felt fine after last visit and feels ionto patch helped to reduce  pain levels.  Tolerated gentle progression of RTC/scapular strengthening activities today well with improved tolerance for sidelying abduction.  Does require some cueing for proper technique with therex.  Ended treatment today with application of ionto patch #2/6 to posterior/lateral shoulder for hopeful reduction in pain over weekend.    PT Treatment/Interventions  ADLs/Self Care Home Management;Cryotherapy;Electrical Stimulation;Iontophoresis 4mg /ml Dexamethasone;Moist Heat;Therapeutic exercise;Therapeutic activities;Neuromuscular re-education;Patient/family education;Manual techniques;Vasopneumatic Device;Taping;Dry needling;Passive range of motion    Consulted and Agree with Plan of Care  Patient       Patient will benefit from skilled therapeutic intervention in order to improve the following deficits and impairments:  Pain, Impaired UE functional use, Decreased strength, Decreased range of motion, Decreased activity tolerance  Visit Diagnosis: Acute pain of right shoulder  Muscle weakness (generalized)  Other symptoms and signs involving the musculoskeletal system     Problem List There are no active problems to display for this patient.   Bess Harvest, PTA 01/26/18 12:04 PM  [facility] [address]  [place], [place], [address] Phone: [phone]   Fax:  [phone]  Name: AIREL MAGADAN MRN: [id number] Date of Birth: October 15, 1965

## 2018-01-29 ENCOUNTER — Encounter: Payer: Self-pay | Admitting: Physical Therapy

## 2018-01-30 ENCOUNTER — Ambulatory Visit: Payer: PRIVATE HEALTH INSURANCE

## 2018-01-30 DIAGNOSIS — R29898 Other symptoms and signs involving the musculoskeletal system: Secondary | ICD-10-CM

## 2018-01-30 DIAGNOSIS — M25511 Pain in right shoulder: Secondary | ICD-10-CM

## 2018-01-30 DIAGNOSIS — M6281 Muscle weakness (generalized): Secondary | ICD-10-CM

## 2018-01-30 NOTE — Therapy (Addendum)
[facility] [address]  [place], [place], [address] Phone: [phone]   Fax:  [phone]  Physical Therapy Treatment  Patient Details  Name: Monica Petty MRN: [id number] Date of Birth: [birth year] Referring Provider: Dr. Theda Sers   Encounter Date: 01/30/2018  PT End of Session - 01/30/18 1538    Visit Number  6    Number of Visits  8    Date for PT Re-Evaluation  02/08/18    Authorization Type  WC    PT Start Time  1017    PT Stop Time  1617    PT Time Calculation (min)  46 min    Activity Tolerance  Patient tolerated treatment well    Behavior During Therapy  [facility] for tasks assessed/performed       Past Medical History:  Diagnosis Date  . Allergy    spring allergies  . GERD (gastroesophageal reflux disease)   . Hypertension     Past Surgical History:  Procedure Laterality Date  . ABDOMINAL HYSTERECTOMY    . BREAST SURGERY     reduction  . CHOLECYSTECTOMY    . REDUCTION MAMMAPLASTY    . WISDOM TOOTH EXTRACTION      There were no vitals filed for this visit.  Subjective Assessment - 01/30/18 1534    Subjective  Pt. reporting she feels she may have "tweaked" the shoulder carrying case of water bottles with both arms.      Diagnostic tests  xray - arthritis per patient    Patient Stated Goals  improve pain, return to work    Currently in Pain?  Yes    Pain Score  3     Pain Location  Shoulder    Pain Orientation  Right;Lateral;Posterior    Pain Descriptors / Indicators  -- "Sensitive"    Pain Type  Acute pain    Aggravating Factors   partially carrying case of water bottles in grocery store     Multiple Pain Sites  No         [facility] PT Assessment - 01/30/18 1544      AROM   Right Shoulder Flexion  158 Degrees pain free    Right Shoulder ABduction  158 Degrees pain free    Right Shoulder Internal Rotation  -- FIR to T6 - pain free    Right Shoulder External Rotation  -- FER to T3 -  pain free      Strength   Strength Assessment Site  Shoulder    Right/Left Shoulder  Right;Left    Right Shoulder Flexion  4/5    Right Shoulder ABduction  4-/5 pain on max effort     Right Shoulder Internal Rotation  4/5    Right Shoulder External Rotation  4-/5 posterior/lateral shoulder pain with max effort    Left Shoulder Flexion  4+/5    Left Shoulder ABduction  4+/5    Left Shoulder Internal Rotation  4+/5    Left Shoulder External Rotation  4+/5                   [facility] Adult PT Treatment/Exercise - 01/30/18 1553      Shoulder Exercises: Supine   Protraction  Right;15 reps;Weights    Protraction Weight (lbs)  4      Shoulder Exercises: Sidelying   External Rotation  Right;Weights;Strengthening;15 reps    External Rotation Weight (lbs)  2      Shoulder Exercises: Standing  External Rotation  15 reps;Right;Theraband    Theraband Level (Shoulder External Rotation)  Level 1 (Yellow)    External Rotation Limitations  bolster under elbow    Flexion  Right;AAROM;10 reps    Shoulder Flexion Weight (lbs)  2    Flexion Limitations  wall ladder focusing on eccentric control     ABduction  Right;10 reps;AAROM    Shoulder ABduction Weight (lbs)  2    ABduction Limitations  wall ladder focusing on eccentric control     Extension  Both;15 reps;Theraband;Strengthening    Theraband Level (Shoulder Extension)  Level 2 (Red)    Other Standing Exercises  R shoulder D1/D2 flexion x 10 reps  Some pain increase in both motions       Shoulder Exercises: ROM/Strengthening   UBE (Upper Arm Bike)  L2 x 3/3    Lat Pull  15 reps    Lat Pull Limitations  15#    Cybex Row  10 reps 2 sets     Cybex Row Limitations  15# - narrow grip       Iontophoresis   Type of Iontophoresis  Dexamethasone    Location  R posterior/lateral shoulder     Dose  92m-min, 1.0 ml,    Time  4-6 hours wear time  #2/6 patch       Manual Therapy   Manual Therapy  Soft tissue mobilization    Manual  therapy comments  sidelying     Soft tissue mobilization  STM to R posterior/lateral shoulder musculature                   PT Long Term Goals - 01/30/18 1544      PT LONG TERM GOAL #1   Title  patient to be independent with advanced HEP    Status  Partially Met met for current       PT LONG TERM GOAL #2   Title  patient to demonstrate full and non-painful AROM (including multi-planar motions) without pain    Status  On-going      PT LONG TERM GOAL #3   Title  patient to improve R shoulder strength to >/= 4+/5 without pain    Status  On-going Still grossly 4-/5-4/5 strength       PT LONG TERM GOAL #4   Title  patient to demosntrate appropriate posture and body mechanics as it relates to her daily activities    Status  On-going      PT LONG TERM GOAL #5   Title  patient to report ability to return to full-time work without pain or risk of re-injury    Status  On-going            Plan - 01/30/18 1539    Clinical Impression Statement  [name] reporting some R shoulder pain over weekend after carrying case of water bottles with B UE however this has improved since then.  Notes she still feels limited by fatigue and pain with holding objects outstretched at shoulder height at this point.  Tolerated mild progression in RTC strengthening and addition of scapular machine strengthening activities well today.  Does still have complaint of R posterior/lateral "burning" pain with fatigue at end of external rotation focused strengthening activities and diagonal band strengthening.  Pt. noting good benefit from ionto patch applied last visit thus ended treatment with application of ionto patch #2/6.  Pt. grossly 4-/5 - 4/5 strength at R shoulder with MMT today with pain with max  effort abduction, ER.  Was able to demo improvement in R shoulder AROM today without pain.  Pt. will continue to benefit from further skilled therapy to maximize functional strength.     PT  Treatment/Interventions  ADLs/Self Care Home Management;Cryotherapy;Electrical Stimulation;Iontophoresis 45m/ml Dexamethasone;Moist Heat;Therapeutic exercise;Therapeutic activities;Neuromuscular re-education;Patient/family education;Manual techniques;Vasopneumatic Device;Taping;Dry needling;Passive range of motion    Consulted and Agree with Plan of Care  Patient       Patient will benefit from skilled therapeutic intervention in order to improve the following deficits and impairments:  Pain, Impaired UE functional use, Decreased strength, Decreased range of motion, Decreased activity tolerance  Visit Diagnosis: Acute pain of right shoulder  Muscle weakness (generalized)  Other symptoms and signs involving the musculoskeletal system     Problem List There are no active problems to display for this patient.   MBess Harvest PTA 01/30/18 4:46 PM  [facility] [address] [place] [address]Phone: [phone]  Fax:  [phone] Name: Monica NOYMRN: [id number] of Birth: 518-Jul-1967   PHYSICAL THERAPY DISCHARGE SUMMARY  Visits from Start of Care: 6  Current functional level related to goals / functional outcomes: See above   Remaining deficits: See above- patient did not return for further visits, reporting MD wants her to have an MRI.   Education / Equipment: See above. Plan: Patient agrees to discharge.  Patient goals were not met. Patient is being discharged due to not returning since the last visit.  ?????       YJanene Harvey PT, DPT 03/06/18 12:26 PM

## 2018-01-31 MED FILL — TRETINOIN 0.025% CREAM: 0.025 | 30 days supply | Qty: 45 | Fill #0

## 2018-01-31 MED FILL — ALPRAZolam 0.5 MG TABS: 0.5 | 30 days supply | Qty: 90 | Fill #0

## 2018-02-02 ENCOUNTER — Ambulatory Visit: Payer: PRIVATE HEALTH INSURANCE

## 2018-02-05 ENCOUNTER — Other Ambulatory Visit (HOSPITAL_COMMUNITY): Payer: Self-pay | Admitting: Orthopedic Surgery

## 2018-02-05 DIAGNOSIS — M25511 Pain in right shoulder: Secondary | ICD-10-CM

## 2018-02-06 ENCOUNTER — Ambulatory Visit: Payer: PRIVATE HEALTH INSURANCE | Admitting: Physical Therapy

## 2018-02-09 ENCOUNTER — Ambulatory Visit: Payer: PRIVATE HEALTH INSURANCE

## 2018-02-13 ENCOUNTER — Ambulatory Visit (HOSPITAL_COMMUNITY)
Admission: RE | Admit: 2018-02-13 | Discharge: 2018-02-13 | Disposition: A | Discharge status: Home / Self Care | Payer: PRIVATE HEALTH INSURANCE | Source: Ambulatory Visit | Attending: Orthopedic Surgery | Admitting: Orthopedic Surgery

## 2018-02-13 DIAGNOSIS — M75101 Unspecified rotator cuff tear or rupture of right shoulder, not specified as traumatic: Secondary | ICD-10-CM | POA: Diagnosis not present

## 2018-02-13 DIAGNOSIS — M75121 Complete rotator cuff tear or rupture of right shoulder, not specified as traumatic: Secondary | ICD-10-CM | POA: Diagnosis not present

## 2018-02-13 DIAGNOSIS — M25511 Pain in right shoulder: Secondary | ICD-10-CM | POA: Diagnosis not present

## 2018-02-13 MED FILL — HYDROCHLOROTHIAZIDE 25 MG T: 25 | 90 days supply | Qty: 90 | Fill #0

## 2018-02-13 MED FILL — PREMARIN 0.625 MG TABLET: 0.625 | 90 days supply | Qty: 90 | Fill #0

## 2018-03-09 MED FILL — LISINOPRIL 5 MG TABLET: 5 | 90 days supply | Qty: 90 | Fill #0

## 2018-03-14 ENCOUNTER — Ambulatory Visit: Payer: PRIVATE HEALTH INSURANCE | Admitting: Physical Therapy

## 2018-03-14 ENCOUNTER — Other Ambulatory Visit: Payer: Self-pay

## 2018-03-14 ENCOUNTER — Ambulatory Visit: Payer: PRIVATE HEALTH INSURANCE | Attending: Specialist | Admitting: Physical Therapy

## 2018-03-14 ENCOUNTER — Ambulatory Visit: Payer: Self-pay | Admitting: Orthopedic Surgery

## 2018-03-14 ENCOUNTER — Encounter: Payer: Self-pay | Admitting: Physical Therapy

## 2018-03-14 DIAGNOSIS — M25511 Pain in right shoulder: Secondary | ICD-10-CM | POA: Diagnosis present

## 2018-03-14 DIAGNOSIS — M25611 Stiffness of right shoulder, not elsewhere classified: Secondary | ICD-10-CM | POA: Diagnosis present

## 2018-03-14 DIAGNOSIS — M6281 Muscle weakness (generalized): Secondary | ICD-10-CM | POA: Insufficient documentation

## 2018-03-14 DIAGNOSIS — R29898 Other symptoms and signs involving the musculoskeletal system: Secondary | ICD-10-CM | POA: Diagnosis present
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
## 2018-03-19 ENCOUNTER — Ambulatory Visit: Payer: PRIVATE HEALTH INSURANCE | Attending: Nurse Practitioner | Admitting: Physical Therapy

## 2018-03-19 DIAGNOSIS — M25611 Stiffness of right shoulder, not elsewhere classified: Secondary | ICD-10-CM | POA: Insufficient documentation

## 2018-03-19 DIAGNOSIS — M25511 Pain in right shoulder: Secondary | ICD-10-CM | POA: Diagnosis not present

## 2018-03-19 DIAGNOSIS — M6281 Muscle weakness (generalized): Secondary | ICD-10-CM | POA: Diagnosis present

## 2018-03-19 DIAGNOSIS — R29898 Other symptoms and signs involving the musculoskeletal system: Secondary | ICD-10-CM | POA: Insufficient documentation

## 2018-03-19 NOTE — Therapy (Signed)
[facility] [address]  [place], [place], [address] Phone: [phone]   Fax:  [phone]  Physical Therapy Treatment  Patient Details  Name: Monica Petty MRN: [id number] Date of Birth: 23-Aug-1966 Referring Provider: Sydnee Cabal, MD   Encounter Date: 03/19/2018  PT End of Session - 03/19/18 1253    Visit Number  2    Number of Visits  18    Date for PT Re-Evaluation  06/08/17    Authorization Type  WC    Authorization Time Period  6 visits pre-op; 12 visits post-op after 08/16    Authorization - Visit Number  2    Authorization - Number of Visits  18    PT Start Time  0802    PT Stop Time  1017    PT Time Calculation (min)  52 min    Activity Tolerance  Patient tolerated treatment well    Behavior During Therapy  [facility] for tasks assessed/performed       Past Medical History:  Diagnosis Date  . Allergy    spring allergies  . GERD (gastroesophageal reflux disease)   . Hypertension     Past Surgical History:  Procedure Laterality Date  . ABDOMINAL HYSTERECTOMY    . BREAST SURGERY     reduction  . CHOLECYSTECTOMY    . REDUCTION MAMMAPLASTY    . WISDOM TOOTH EXTRACTION      There were no vitals filed for this visit.  Subjective Assessment - 03/19/18 0804    Subjective  Patient went to the beach over the weekend. Reports she must have slept wrong on that shoulder.     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 Maximum retraction is 16.5 mm and the tear is 14 mm wide. Mild tendinopathy involving the infraspinatus tendon probable small intrasubstance tears. The subscapularis tendon is intact.    Patient Stated Goals  keep strengthening and mobility of R shoulder    Currently in Pain?  Yes    Pain Score  3     Pain Location  Shoulder    Pain Orientation  Right;Proximal    Pain Descriptors / Indicators  Aching                        [facility] Adult PT Treatment/Exercise - 03/19/18 0001      Exercises   Exercises  Shoulder      Shoulder Exercises: Supine   Protraction  AAROM;15 reps    Protraction Limitations  wand    External Rotation  AAROM;Both;10 reps;Limitations    External Rotation Limitations  wand TCs/Vcs to keep elbows neutral    Flexion  AAROM;Both;10 reps;Limitations    Flexion Limitations  wand    ABduction  AAROM;Both;10 reps;Limitations TCs to correct form    ABduction Limitations  wand    Other Supine Exercises  reverse codman's pendulum CW/CCW 15x each with assisted from L hand      Shoulder Exercises: Sidelying   External Rotation  Strengthening;Right;10 reps;Limitations    External Rotation Limitations  R shoulder in neutral with dowel; to tolerance; 2x10    ABduction  AROM;Right;10 reps    ABduction Limitations  thumb up; to tolerance; 2x10      Shoulder Exercises: Standing   Flexion  --    Flexion Limitations  --    Other Standing Exercises  Pendulum: CC/CCW/ant-pos/M/L 30sec  Shoulder Exercises: Isometric Strengthening   Flexion  Limitations    Flexion Limitations  10x10" with 10% effort    ABduction  Limitations    ABduction Limitations  10x10" with 10% effort      Modalities   Modalities  Vasopneumatic      Vasopneumatic   Number Minutes Vasopneumatic   10 minutes    Vasopnuematic Location   Shoulder R    Vasopneumatic Pressure  Low    Vasopneumatic Temperature   Coldest      Manual Therapy   Manual Therapy  Soft tissue mobilization    Manual therapy comments  Supine    Soft tissue mobilization  R deltoid, pec, infraspinatus, tricep- TTP in pos deltoid and tricep               PT Short Term Goals - 03/14/18 1001      PT SHORT TERM GOAL #1   Title  Patient to be independent with initial HEP.    Time  3    Period  Weeks    Status  New    Target Date  04/04/18        PT Long Term Goals - 03/14/18 1002      PT LONG TERM  GOAL #1   Title  patient to be independent with advanced HEP    Time  9    Period  Weeks    Status  New    Target Date  05/16/18      PT LONG TERM GOAL #2   Title  patient to demonstrate full and non-painful AROM (including multi-planar motions) without pain    Time  9    Period  Weeks    Status  New    Target Date  05/16/18      PT LONG TERM GOAL #3   Title  patient to improve R shoulder strength to >/= 4+/5 without pain    Time  9    Period  Weeks    Status  New    Target Date  05/16/18      PT LONG TERM GOAL #4   Title  Patient to demonstrate overhead reaching to place 5# object on shelf overhead with <=1/10 pain.    Time  9    Period  Weeks    Status  New    Target Date  05/16/18      PT LONG TERM GOAL #5   Title  Patient to report tolerance of 1 full shift at work without pain.    Time  11    Period  Weeks    Status  New    Target Date  05/30/18            Plan - 03/19/18 1254    Clinical Impression Statement  Patient arrived to session with no new complaints- reports some achiness today d/t possible "sleeping on it wrong last night." Also reporting trouble with AAROM exercises. Reviewed R shoulder flexion, abduction, and ER/IR with VC/TCs to assist with form. Patient tolerated STM to R  deltoid, pec, infraspinatus, tricep- TTP in pos deltoid and tricep with some relief after STM. Patient tolerated gentle periscapular and RTC strengthening this date. Received [place] to R shoulder at end of session- report that this modality gives her great relief.     PT Treatment/Interventions  ADLs/Self Care Home Management;Cryotherapy;Electrical Stimulation;Moist Heat;Therapeutic exercise;Therapeutic activities;Neuromuscular re-education;Patient/family education;Manual techniques;Vasopneumatic Device;Taping;Dry needling;Passive range of motion;Ultrasound;Splinting;Energy conservation;Scar mobilization    PT Next Visit Plan  continue with gentle isometrics and periscapular  strengthening     Consulted and Agree with Plan of Care  Patient       Patient will benefit from skilled therapeutic intervention in order to improve the following deficits and impairments:  Pain, Impaired UE functional use, Decreased strength, Decreased range of motion, Decreased activity tolerance, Decreased scar mobility, Postural dysfunction, Impaired flexibility  Visit Diagnosis: Acute pain of right shoulder  Stiffness of right shoulder, not elsewhere classified  Muscle weakness (generalized)  Other symptoms and signs involving the musculoskeletal system     Problem List There are no active problems to display for this patient.   Janene Harvey, PT, DPT 03/19/18 1:00 PM   [facility] [address]  [place], [place], [address] Phone: [phone]   Fax:  [phone]  Name: KANYIA HEASLIP MRN: [id number] Date of Birth: Jun 15, 1966

## 2018-03-28 ENCOUNTER — Ambulatory Visit: Payer: PRIVATE HEALTH INSURANCE | Admitting: Physical Therapy

## 2018-03-28 ENCOUNTER — Encounter: Payer: Self-pay | Admitting: Physical Therapy

## 2018-03-28 DIAGNOSIS — M6281 Muscle weakness (generalized): Secondary | ICD-10-CM

## 2018-03-28 DIAGNOSIS — M25511 Pain in right shoulder: Secondary | ICD-10-CM | POA: Diagnosis not present

## 2018-03-28 DIAGNOSIS — M25611 Stiffness of right shoulder, not elsewhere classified: Secondary | ICD-10-CM

## 2018-03-28 DIAGNOSIS — R29898 Other symptoms and signs involving the musculoskeletal system: Secondary | ICD-10-CM

## 2018-03-28 NOTE — Therapy (Signed)
[facility] [place], [place], [address] Phone: [phone]   Fax:  [phone]  Physical Therapy Treatment  Patient Details  Name: Monica Petty MRN: [id number] Date of Birth: 1966-08-27 Referring Provider: Sydnee Cabal, MD   Encounter Date: 03/28/2018  PT End of Session - 03/28/18 0838    Visit Number  3    Number of Visits  18    Date for PT Re-Evaluation  06/08/17    Authorization Type  WC    Authorization Time Period  6 visits pre-op; 12 visits post-op after 08/16    Authorization - Visit Number  3    Authorization - Number of Visits  18    PT Start Time  0801    PT Stop Time  0850    PT Time Calculation (min)  49 min    Activity Tolerance  Patient tolerated treatment well;Patient limited by pain    Behavior During Therapy  [facility] for tasks assessed/performed       Past Medical History:  Diagnosis Date  . Allergy    spring allergies  . GERD (gastroesophageal reflux disease)   . Hypertension     Past Surgical History:  Procedure Laterality Date  . ABDOMINAL HYSTERECTOMY    . BREAST SURGERY     reduction  . CHOLECYSTECTOMY    . REDUCTION MAMMAPLASTY    . WISDOM TOOTH EXTRACTION      There were no vitals filed for this visit.  Subjective Assessment - 03/28/18 0801    Subjective  Reports everything has been good since last visit. Reports compliance with HEP.    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 Maximum retraction is 16.5 mm and the tear is 14 mm wide. Mild tendinopathy involving the infraspinatus tendon probable small intrasubstance tears. The subscapularis tendon is intact.    Patient Stated Goals  keep strengthening and mobility of R shoulder    Currently in Pain?  No/denies                       [facility] Adult PT Treatment/Exercise - 03/28/18 0001      Shoulder Exercises: Supine   Protraction  AAROM;15 reps    Protraction Limitations  wand    External Rotation  AAROM;Both;10 reps;Limitations    External Rotation Limitations  wand    Flexion  AAROM;Both;10 reps;Limitations    Flexion Limitations  wand    ABduction  AAROM;Both;10 reps;Limitations cues to perform through full tolerable ROM    ABduction Limitations  wand    Other Supine Exercises  reverse codman's pendulum CW/CCW 15x each with assisted from L hand      Shoulder Exercises: Seated   Retraction  Strengthening;Both;10 reps;Limitations    Retraction Limitations  10x3"       Shoulder Exercises: Sidelying   External Rotation  Strengthening;Right;10 reps;Limitations 2x10    External Rotation Weight (lbs)  2x10; dowel under elbow to keep shoulder neutral    Flexion  AROM;Right;10 reps;Limitations    Flexion Limitations  pt reporting burning in R pos delt after 10 reps- discontinued    ABduction  AROM;Right;10 reps    ABduction Limitations  2x10; thumb up; to tolerance      Shoulder Exercises: Standing   Other Standing Exercises  Pendulum: CC/CCW/ant-pos/M/L 30sec      Vasopneumatic   Number Minutes Vasopneumatic   15 minutes  Vasopnuematic Location   Shoulder R    Vasopneumatic Pressure  Low    Vasopneumatic Temperature   Coldest      Manual Therapy   Manual Therapy  Soft tissue mobilization    Manual therapy comments  Supine    Soft tissue mobilization  R posterior and lateral deltoid, pec, infraspinatus, tricep, UT- TTP in pos deltoid, UT, tricep             PT Education - 03/28/18 7673    Education provided  Yes    Education Details  addition to HEP    Person(s) Educated  Patient    Methods  Explanation;Demonstration;Tactile cues;Verbal cues;Handout    Comprehension  Returned demonstration;Verbalized understanding       PT Short Term Goals - 03/28/18 0853      PT SHORT TERM GOAL #1   Title  Patient to be independent with initial HEP.    Time  3    Period  Weeks    Status   Achieved        PT Long Term Goals - 03/14/18 1002      PT LONG TERM GOAL #1   Title  patient to be independent with advanced HEP    Time  9    Period  Weeks    Status  New    Target Date  05/16/18      PT LONG TERM GOAL #2   Title  patient to demonstrate full and non-painful AROM (including multi-planar motions) without pain    Time  9    Period  Weeks    Status  New    Target Date  05/16/18      PT LONG TERM GOAL #3   Title  patient to improve R shoulder strength to >/= 4+/5 without pain    Time  9    Period  Weeks    Status  New    Target Date  05/16/18      PT LONG TERM GOAL #4   Title  Patient to demonstrate overhead reaching to place 5# object on shelf overhead with <=1/10 pain.    Time  9    Period  Weeks    Status  New    Target Date  05/16/18      PT LONG TERM GOAL #5   Title  Patient to report tolerance of 1 full shift at work without pain.    Time  11    Period  Weeks    Status  New    Target Date  05/30/18            Plan - 03/28/18 0853    Clinical Impression Statement  Patient arrived to session with no new complaints. Reports she is still on light duty at work. Tolerated STM to R posterior and lateral deltoid, pec, infraspinatus, tricep, UT- TTP in posterior deltoid, UT, tricep this date. Patient able to perform supine R shoulder AAROM with wand with good form throughout. Addition of sidelying R shoulder flexion- patient tolerated 10 reps but with report of "burning" in R posterior delt by last rep; discontinued. Patient received addition STM to posterior delt after this exercise to ease report of burning and aching. Received Gameready to R shoulder at end of session for continued pain relief; normal integumentary response observed at end of session. Updated HEP with sidelying ER and abduction and administered handout; patient reported understanding.     PT Treatment/Interventions  ADLs/Self Care Home Management;Cryotherapy;Dealer  Stimulation;Moist Heat;Therapeutic exercise;Therapeutic activities;Neuromuscular re-education;Patient/family education;Manual techniques;Vasopneumatic Device;Taping;Dry needling;Passive range of motion;Ultrasound;Splinting;Energy conservation;Scar mobilization    Consulted and Agree with Plan of Care  Patient       Patient will benefit from skilled therapeutic intervention in order to improve the following deficits and impairments:  Pain, Impaired UE functional use, Decreased strength, Decreased range of motion, Decreased activity tolerance, Decreased scar mobility, Postural dysfunction, Impaired flexibility  Visit Diagnosis: Acute pain of right shoulder  Stiffness of right shoulder, not elsewhere classified  Muscle weakness (generalized)  Other symptoms and signs involving the musculoskeletal system     Problem List There are no active problems to display for this patient.   Janene Harvey, PT, DPT 03/28/18 8:55 AM   [facility] [place], [place], [address] Phone: [phone]   Fax:  [phone]  Name: Monica Petty MRN: [id number] Date of Birth: [birth year]

## 2018-04-02 ENCOUNTER — Ambulatory Visit: Payer: PRIVATE HEALTH INSURANCE | Attending: Specialist | Admitting: Physical Therapy

## 2018-04-02 ENCOUNTER — Encounter: Payer: Self-pay | Admitting: Physical Therapy

## 2018-04-02 DIAGNOSIS — M25511 Pain in right shoulder: Secondary | ICD-10-CM | POA: Insufficient documentation

## 2018-04-02 DIAGNOSIS — R29898 Other symptoms and signs involving the musculoskeletal system: Secondary | ICD-10-CM | POA: Insufficient documentation

## 2018-04-02 DIAGNOSIS — M6281 Muscle weakness (generalized): Secondary | ICD-10-CM | POA: Diagnosis present

## 2018-04-02 DIAGNOSIS — M25611 Stiffness of right shoulder, not elsewhere classified: Secondary | ICD-10-CM | POA: Diagnosis present

## 2018-04-02 NOTE — Therapy (Signed)
[facility] [address]  [place], [place], [address] Phone: [phone]   Fax:  [phone]  Physical Therapy Treatment  Patient Details  Name: Monica Petty MRN: [id number] Date of Birth: 1966-03-04 Referring Provider: Sydnee Cabal, MD   Encounter Date: 04/02/2018  PT End of Session - 04/02/18 1139    Visit Number  4    Number of Visits  18    Date for PT Re-Evaluation  06/08/17    Authorization Type  WC    Authorization Time Period  6 visits pre-op; 12 visits post-op after 08/16    Authorization - Visit Number  4    Authorization - Number of Visits  18    PT Start Time  0800    PT Stop Time  1007    PT Time Calculation (min)  55 min    Activity Tolerance  Patient tolerated treatment well;Patient limited by pain    Behavior During Therapy  [facility] for tasks assessed/performed       Past Medical History:  Diagnosis Date  . Allergy    spring allergies  . GERD (gastroesophageal reflux disease)   . Hypertension     Past Surgical History:  Procedure Laterality Date  . ABDOMINAL HYSTERECTOMY    . BREAST SURGERY     reduction  . CHOLECYSTECTOMY    . REDUCTION MAMMAPLASTY    . WISDOM TOOTH EXTRACTION      There were no vitals filed for this visit.  Subjective Assessment - 04/02/18 0801    Subjective  Reports everything is the same as before. Ready for her surgery on 07/30.    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 Maximum retraction is 16.5 mm and the tear is 14 mm wide. Mild tendinopathy involving the infraspinatus tendon probable small intrasubstance tears. The subscapularis tendon is intact.    Patient Stated Goals  keep strengthening and mobility of R shoulder    Currently in Pain?  No/denies         [facility] PT Assessment - 04/02/18 0001      AROM   AROM Assessment Site  Shoulder    Right/Left Shoulder  Right    Right  Shoulder Flexion  124 Degrees    Right Shoulder ABduction  130 Degrees    Right Shoulder Internal Rotation  12 Degrees    Right Shoulder External Rotation  75 Degrees      PROM   PROM Assessment Site  Shoulder    Right/Left Shoulder  Right patient limited by pain    Right Shoulder Flexion  134 Degrees    Right Shoulder ABduction  100 Degrees    Right Shoulder Internal Rotation  50 Degrees    Right Shoulder External Rotation  54 Degrees                   [facility] Adult PT Treatment/Exercise - 04/02/18 0001      Shoulder Exercises: Supine   Protraction  AAROM;15 reps    Protraction Limitations  wand    External Rotation  AAROM;Both;10 reps;Limitations    External Rotation Limitations  wand    Flexion  AAROM;Both;10 reps;Limitations    Flexion Limitations  wand    ABduction  AAROM;Both;10 reps;Limitations    ABduction Limitations  wand    Other Supine Exercises  reverse codman's pendulum CW/CCW 15x each with assisted from L hand  Shoulder Exercises: Sidelying   External Rotation  Strengthening;Right;10 reps;Limitations    External Rotation Weight (lbs)  2x10; dowel under elbow to keep shoulder neutral    ABduction  AROM;Right;10 reps    ABduction Limitations  10x with thumb up; to tolerance; pt report of catching      Shoulder Exercises: Standing   Other Standing Exercises  Pendulum: CC/CCW/ant-pos/M/L 30sec      Shoulder Exercises: Isometric Strengthening   External Rotation  5X10";Limitations    External Rotation Limitations  30% effort with shoulder/elbow at neutral    Internal Rotation  5X10"    ABduction  Limitations    ABduction Limitations  30% effort with shoulder/elbow at neutral      Vasopneumatic   Number Minutes Vasopneumatic   15 minutes    Vasopnuematic Location   Shoulder R    Vasopneumatic Pressure  Low    Vasopneumatic Temperature   Coldest      Manual Therapy   Manual Therapy  Soft tissue mobilization    Manual therapy comments  Supine     Soft tissue mobilization  R posterior and lateral deltoid, pec, infraspinatus, tricep, UT- no tenderness, mild restriction in lat deltoid             PT Education - 04/02/18 1138    Education provided  Yes    Education Details  Post-op HEP administered; EDU about post-surgical precautions, dressing, sling use    Person(s) Educated  Patient    Methods  Explanation;Demonstration;Tactile cues;Verbal cues;Handout    Comprehension  Returned demonstration;Verbalized understanding       PT Short Term Goals - 04/02/18 0805      PT SHORT TERM GOAL #1   Title  Patient to be independent with initial HEP.    Time  3    Period  Weeks    Status  Achieved        PT Long Term Goals - 04/02/18 0805      PT LONG TERM GOAL #1   Title  patient to be independent with advanced HEP    Time  9    Period  Weeks    Status  On-going met for current      PT LONG TERM GOAL #2   Title  patient to demonstrate full and non-painful AROM (including multi-planar motions) without pain    Time  9    Period  Weeks    Status  On-going still presents with painful R shoulder AROM in all planes      PT LONG TERM GOAL #3   Title  patient to improve R shoulder strength to >/= 4+/5 without pain    Time  9    Period  Weeks    Status  On-going held until post-surgery      PT LONG TERM GOAL #4   Title  Patient to demonstrate overhead reaching to place 5# object on shelf overhead with <=1/10 pain.    Time  9    Period  Weeks    Status  On-going held until post-surgery      PT LONG TERM GOAL #5   Title  Patient to report tolerance of 1 full shift at work without pain.    Time  11    Period  Weeks    Status  On-going held until post-surgery            Plan - 04/02/18 1152    Clinical Impression Statement  Patient returns today for last  PT session before her R RTC repair on 04/17/18, as patient will be leaving for the beach next week. Patient with no new complaints this session. Completed R  shoulder measurements this session- patient has shown improvements in all planes of AROM and PROM with exception of AROM flexion and IR and PROM flexion as patient is limited by pain. Held all other measurements until post-op. Completed all AAROM exercises with good form today- minimal cues required to keep elbows tucked during AAROM IR/ER. Patient with good tolerance of sidelying R shoulder ER, slightly decreased tolerance of sidelying abduction. Spoke with patient at length about post-surgical precautions, dressing, sling use. Administered post-op HEP handout and advised patient not to push into pain with any activities post-op. Patient reported understanding. Received Gameready to R shoulder for continued pain-relief. Patient without pain at end of session.     PT Treatment/Interventions  ADLs/Self Care Home Management;Cryotherapy;Electrical Stimulation;Moist Heat;Therapeutic exercise;Therapeutic activities;Neuromuscular re-education;Patient/family education;Manual techniques;Vasopneumatic Device;Taping;Dry needling;Passive range of motion;Ultrasound;Splinting;Energy conservation;Scar mobilization    PT Next Visit Plan  re-eval post-op next session    Consulted and Agree with Plan of Care  Patient       Patient will benefit from skilled therapeutic intervention in order to improve the following deficits and impairments:  Pain, Impaired UE functional use, Decreased strength, Decreased range of motion, Decreased activity tolerance, Decreased scar mobility, Postural dysfunction, Impaired flexibility  Visit Diagnosis: Acute pain of right shoulder  Stiffness of right shoulder, not elsewhere classified  Muscle weakness (generalized)  Other symptoms and signs involving the musculoskeletal system     Problem List There are no active problems to display for this patient.  Janene Harvey, PT, DPT 04/02/18 11:59 AM   [facility] [address]  [place], [place], [address] Phone: [phone]   Fax:  [phone]  Name: Monica Petty MRN: [id number] Date of Birth: Sep 22, 1965

## 2018-04-12 ENCOUNTER — Other Ambulatory Visit: Payer: Self-pay

## 2018-04-12 ENCOUNTER — Encounter (HOSPITAL_BASED_OUTPATIENT_CLINIC_OR_DEPARTMENT_OTHER): Payer: Self-pay | Admitting: *Deleted

## 2018-04-12 NOTE — Progress Notes (Signed)
Spoke w/ pt via phone for pre-op interview.  Npo after mn w/ exception clear liquids until 0700 (no cream/ milk products).  Arrive at 1100.  Needs istat 8 and ekg.  Will take premarin am dos w/ sips of water.

## 2018-04-16 MED FILL — ALPRAZolam 0.5 MG TABS: 0.5 | 30 days supply | Qty: 90 | Fill #1

## 2018-04-17 ENCOUNTER — Ambulatory Visit (HOSPITAL_BASED_OUTPATIENT_CLINIC_OR_DEPARTMENT_OTHER): Payer: PRIVATE HEALTH INSURANCE | Admitting: Anesthesiology

## 2018-04-17 ENCOUNTER — Encounter (HOSPITAL_BASED_OUTPATIENT_CLINIC_OR_DEPARTMENT_OTHER)
Admission: RE | Disposition: A | Discharge status: Home / Self Care | Payer: Self-pay | Source: Ambulatory Visit | Attending: Specialist

## 2018-04-17 ENCOUNTER — Ambulatory Visit (HOSPITAL_BASED_OUTPATIENT_CLINIC_OR_DEPARTMENT_OTHER)
Admission: RE | Admit: 2018-04-17 | Discharge: 2018-04-17 | Disposition: A | Discharge status: Home / Self Care | Payer: PRIVATE HEALTH INSURANCE | Source: Ambulatory Visit | Attending: Specialist | Admitting: Specialist

## 2018-04-17 ENCOUNTER — Encounter (HOSPITAL_BASED_OUTPATIENT_CLINIC_OR_DEPARTMENT_OTHER): Payer: Self-pay

## 2018-04-17 DIAGNOSIS — Y990 Civilian activity done for income or pay: Secondary | ICD-10-CM | POA: Diagnosis not present

## 2018-04-17 DIAGNOSIS — X58XXXA Exposure to other specified factors, initial encounter: Secondary | ICD-10-CM | POA: Insufficient documentation

## 2018-04-17 DIAGNOSIS — I10 Essential (primary) hypertension: Secondary | ICD-10-CM | POA: Diagnosis not present

## 2018-04-17 DIAGNOSIS — Z79899 Other long term (current) drug therapy: Secondary | ICD-10-CM | POA: Insufficient documentation

## 2018-04-17 DIAGNOSIS — S46011A Strain of muscle(s) and tendon(s) of the rotator cuff of right shoulder, initial encounter: Secondary | ICD-10-CM | POA: Insufficient documentation

## 2018-04-17 DIAGNOSIS — Y9289 Other specified places as the place of occurrence of the external cause: Secondary | ICD-10-CM | POA: Diagnosis not present

## 2018-04-17 DIAGNOSIS — K219 Gastro-esophageal reflux disease without esophagitis: Secondary | ICD-10-CM | POA: Insufficient documentation

## 2018-04-17 DIAGNOSIS — F419 Anxiety disorder, unspecified: Secondary | ICD-10-CM | POA: Diagnosis not present

## 2018-04-17 DIAGNOSIS — Z9889 Other specified postprocedural states: Secondary | ICD-10-CM

## 2018-04-17 HISTORY — DX: Migraine, unspecified, not intractable, without status migrainosus: G43.909

## 2018-04-17 HISTORY — DX: Anxiety disorder, unspecified: F41.9

## 2018-04-17 HISTORY — DX: Personal history of traumatic brain injury: Z87.820

## 2018-04-17 HISTORY — PX: SHOULDER ARTHROSCOPY WITH ROTATOR CUFF REPAIR AND SUBACROMIAL DECOMPRESSION: SHX5686

## 2018-04-17 HISTORY — DX: Diverticulosis of large intestine without perforation or abscess without bleeding: K57.30

## 2018-04-17 LAB — POCT I-STAT, CHEM 8
BUN: 17 mg/dL (ref 6–20)
CHLORIDE: 103 mmol/L (ref 98–111)
CREATININE: 0.6 mg/dL (ref 0.44–1.00)
Calcium, Ion: 1.17 mmol/L (ref 1.15–1.40)
Glucose, Bld: 99 mg/dL (ref 70–99)
HEMATOCRIT: 46 % (ref 36.0–46.0)
HEMOGLOBIN: 15.6 g/dL — AB (ref 12.0–15.0)
POTASSIUM: 4.7 mmol/L (ref 3.5–5.1)
Sodium: 141 mmol/L (ref 135–145)
TCO2: 27 mmol/L (ref 22–32)

## 2018-04-17 SURGERY — SHOULDER ARTHROSCOPY WITH ROTATOR CUFF REPAIR AND SUBACROMIAL DECOMPRESSION
Anesthesia: General | Site: Shoulder | Laterality: Right

## 2018-04-17 MED ORDER — SUGAMMADEX SODIUM 200 MG/2ML IV SOLN
INTRAVENOUS | Status: DC | PRN
  Administered 2018-04-17: 200 mg via INTRAVENOUS

## 2018-04-17 MED ORDER — HYDROMORPHONE HCL 2 MG PO TABS: 2.0000 mg | ORAL_TABLET | ORAL | 0 refills | Status: AC | PRN

## 2018-04-17 MED ORDER — VANCOMYCIN HCL IN DEXTROSE 1-5 GM/200ML-% IV SOLN
INTRAVENOUS | Status: AC
  Filled 2018-04-17: qty 200

## 2018-04-17 MED ORDER — FENTANYL CITRATE (PF) 100 MCG/2ML IJ SOLN
INTRAMUSCULAR | Status: AC
  Filled 2018-04-17: qty 2

## 2018-04-17 MED ORDER — OXYCODONE HCL 5 MG PO TABS
5.0000 mg | ORAL_TABLET | Freq: Once | ORAL | Status: AC | PRN
  Administered 2018-04-17: 5 mg via ORAL
  Filled 2018-04-17: qty 1

## 2018-04-17 MED ORDER — MIDAZOLAM HCL 2 MG/2ML IJ SOLN
INTRAMUSCULAR | Status: DC | PRN
  Administered 2018-04-17: 2 mg via INTRAVENOUS

## 2018-04-17 MED ORDER — ONDANSETRON HCL 4 MG/2ML IJ SOLN
INTRAMUSCULAR | Status: AC
  Filled 2018-04-17: qty 2

## 2018-04-17 MED ORDER — ROCURONIUM BROMIDE 100 MG/10ML IV SOLN
INTRAVENOUS | Status: DC | PRN
  Administered 2018-04-17: 50 mg via INTRAVENOUS

## 2018-04-17 MED ORDER — LIDOCAINE 2% (20 MG/ML) 5 ML SYRINGE
INTRAMUSCULAR | Status: DC | PRN
  Administered 2018-04-17: 80 mg via INTRAVENOUS

## 2018-04-17 MED ORDER — ROCURONIUM BROMIDE 100 MG/10ML IV SOLN
INTRAVENOUS | Status: AC
  Filled 2018-04-17: qty 1

## 2018-04-17 MED ORDER — DEXAMETHASONE SODIUM PHOSPHATE 10 MG/ML IJ SOLN
INTRAMUSCULAR | Status: DC | PRN
  Administered 2018-04-17: 10 mg via INTRAVENOUS

## 2018-04-17 MED ORDER — SCOPOLAMINE 1 MG/3DAYS TD PT72
MEDICATED_PATCH | TRANSDERMAL | Status: DC | PRN
  Administered 2018-04-17: 1 via TRANSDERMAL

## 2018-04-17 MED ORDER — METHOCARBAMOL 500 MG PO TABS: 500.0000 mg | ORAL_TABLET | Freq: Three times a day (TID) | ORAL | 2 refills | Status: DC | PRN

## 2018-04-17 MED ORDER — ROPIVACAINE HCL 7.5 MG/ML IJ SOLN
INTRAMUSCULAR | Status: DC | PRN
  Administered 2018-04-17: 20 mL via PERINEURAL

## 2018-04-17 MED ORDER — DEXAMETHASONE SODIUM PHOSPHATE 10 MG/ML IJ SOLN
INTRAMUSCULAR | Status: AC
  Filled 2018-04-17: qty 1

## 2018-04-17 MED ORDER — LIDOCAINE 2% (20 MG/ML) 5 ML SYRINGE
INTRAMUSCULAR | Status: AC
  Filled 2018-04-17: qty 5

## 2018-04-17 MED ORDER — PROPOFOL 10 MG/ML IV BOLUS
INTRAVENOUS | Status: AC
  Filled 2018-04-17: qty 20

## 2018-04-17 MED ORDER — PROMETHAZINE HCL 25 MG/ML IJ SOLN
6.2500 mg | INTRAMUSCULAR | Status: DC | PRN
  Filled 2018-04-17: qty 1

## 2018-04-17 MED ORDER — FENTANYL CITRATE (PF) 100 MCG/2ML IJ SOLN
INTRAMUSCULAR | Status: DC | PRN
  Administered 2018-04-17 (×2): 100 ug via INTRAVENOUS

## 2018-04-17 MED ORDER — PROPOFOL 10 MG/ML IV BOLUS
INTRAVENOUS | Status: DC | PRN
  Administered 2018-04-17: 200 mg via INTRAVENOUS

## 2018-04-17 MED ORDER — SODIUM CHLORIDE 0.9 % IJ SOLN
INTRAMUSCULAR | Status: DC | PRN
  Administered 2018-04-17: 10 mL via INTRA_ARTICULAR

## 2018-04-17 MED ORDER — MIDAZOLAM HCL 2 MG/2ML IJ SOLN
INTRAMUSCULAR | Status: AC
  Filled 2018-04-17: qty 2

## 2018-04-17 MED ORDER — SCOPOLAMINE 1 MG/3DAYS TD PT72
MEDICATED_PATCH | TRANSDERMAL | Status: AC
  Filled 2018-04-17: qty 1

## 2018-04-17 MED ORDER — CHLORHEXIDINE GLUCONATE 4 % EX LIQD
60.0000 mL | Freq: Once | CUTANEOUS | Status: DC
  Filled 2018-04-17: qty 118

## 2018-04-17 MED ORDER — OXYCODONE HCL 5 MG PO TABS
ORAL_TABLET | ORAL | Status: AC
  Filled 2018-04-17: qty 1

## 2018-04-17 MED ORDER — SUGAMMADEX SODIUM 200 MG/2ML IV SOLN
INTRAVENOUS | Status: AC
  Filled 2018-04-17: qty 2

## 2018-04-17 MED ORDER — FENTANYL CITRATE (PF) 100 MCG/2ML IJ SOLN
25.0000 ug | INTRAMUSCULAR | Status: DC | PRN
  Filled 2018-04-17: qty 1

## 2018-04-17 MED ORDER — LACTATED RINGERS IV SOLN
INTRAVENOUS | Status: DC
  Administered 2018-04-17: 12:00:00 via INTRAVENOUS
  Filled 2018-04-17: qty 1000

## 2018-04-17 MED ORDER — SODIUM CHLORIDE 0.9 % IR SOLN
Status: DC | PRN
  Administered 2018-04-17: 15000 mL

## 2018-04-17 MED ORDER — BUPIVACAINE HCL (PF) 0.25 % IJ SOLN
INTRAMUSCULAR | Status: DC | PRN
  Administered 2018-04-17: 20 mL

## 2018-04-17 MED ORDER — VANCOMYCIN HCL IN DEXTROSE 1-5 GM/200ML-% IV SOLN
1000.0000 mg | INTRAVENOUS | Status: AC
  Administered 2018-04-17: 1000 mg via INTRAVENOUS
  Filled 2018-04-17: qty 200

## 2018-04-17 MED ORDER — OXYCODONE HCL 5 MG/5ML PO SOLN
5.0000 mg | Freq: Once | ORAL | Status: AC | PRN
  Filled 2018-04-17: qty 5

## 2018-04-17 MED ORDER — DOXYCYCLINE HYCLATE 100 MG PO CAPS: 100.0000 mg | ORAL_CAPSULE | Freq: Two times a day (BID) | ORAL | 0 refills | Status: DC

## 2018-04-17 MED ORDER — ONDANSETRON HCL 4 MG/2ML IJ SOLN
INTRAMUSCULAR | Status: DC | PRN
  Administered 2018-04-17: 4 mg via INTRAVENOUS

## 2018-04-17 MED ORDER — FENTANYL CITRATE (PF) 250 MCG/5ML IJ SOLN
INTRAMUSCULAR | Status: AC
  Filled 2018-04-17: qty 5

## 2018-04-17 MED FILL — DOXYCYCLINE HYCLATE 100 MG: 100 | 5 days supply | Qty: 10 | Fill #0

## 2018-04-17 MED FILL — HYDROmorphone HCL 2 MG TABS: 2 | 7 days supply | Qty: 40 | Fill #0

## 2018-04-17 MED FILL — METHOCARBAMOL 500 MG TABLET: 500 | 16 days supply | Qty: 50 | Fill #0

## 2018-04-17 SURGICAL SUPPLY — 77 items
ANCH SUT SWLK 19.1X4.75 VT (Anchor) ×2 IMPLANT
ANCHOR PEEK 4.75X19.1 SWLK C (Anchor) ×4 IMPLANT
BLADE CUDA GRT WHITE 3.5 (BLADE) ×3 IMPLANT
BLADE GREAT WHITE 4.2 (BLADE) ×2 IMPLANT
BLADE GREAT WHITE 4.2MM (BLADE) ×1
BLADE SURG 11 STRL SS (BLADE) ×3 IMPLANT
BLADE SURG 15 STRL LF DISP TIS (BLADE) ×1 IMPLANT
BLADE SURG 15 STRL SS (BLADE) ×3
BUR 3.5 LG SPHERICAL (BURR) IMPLANT
BUR OVAL 6.0 (BURR) ×3 IMPLANT
BURR 3.5 LG SPHERICAL (BURR)
BURR 3.5MM LG SPHERICAL (BURR)
CANNULA 5.75X7 CRYSTAL CLEAR (CANNULA) ×3 IMPLANT
CANNULA 5.75X71 LONG (CANNULA) IMPLANT
CANNULA TWIST IN 8.25X7CM (CANNULA) ×4 IMPLANT
DRAPE LG THREE QUARTER DISP (DRAPES) ×3 IMPLANT
DRAPE ORTHO SPLIT 77X108 STRL (DRAPES) ×6
DRAPE POUCH INSTRU U-SHP 10X18 (DRAPES) ×3 IMPLANT
DRAPE STERI 35X30 U-POUCH (DRAPES) ×3 IMPLANT
DRAPE SURG 17X23 STRL (DRAPES) ×3 IMPLANT
DRAPE SURG ORHT 6 SPLT 77X108 (DRAPES) ×2 IMPLANT
DRAPE U-SHAPE 47X51 STRL (DRAPES) ×3 IMPLANT
DURAPREP 26ML APPLICATOR (WOUND CARE) ×3 IMPLANT
ELECT MENISCUS 165MM 90D (ELECTRODE) IMPLANT
ELECT REM PT RETURN 9FT ADLT (ELECTROSURGICAL) ×3
ELECTRODE REM PT RTRN 9FT ADLT (ELECTROSURGICAL) ×1 IMPLANT
FIBERSTICK 2 (SUTURE) IMPLANT
GAUZE SPONGE 4X4 12PLY STRL (GAUZE/BANDAGES/DRESSINGS) ×3 IMPLANT
GAUZE XEROFORM 1X8 LF (GAUZE/BANDAGES/DRESSINGS) ×3 IMPLANT
GLOVE BIO SURGEON STRL SZ7.5 (GLOVE) ×3 IMPLANT
GLOVE BIO SURGEON STRL SZ8 (GLOVE) ×3 IMPLANT
GLOVE INDICATOR 8.0 STRL GRN (GLOVE) ×6 IMPLANT
GOWN STRL REUS W/TWL XL LVL3 (GOWN DISPOSABLE) ×6 IMPLANT
KIT TURNOVER CYSTO (KITS) ×3 IMPLANT
LASSO SUT 90 DEGREE (SUTURE) IMPLANT
MANIFOLD NEPTUNE II (INSTRUMENTS) IMPLANT
NDL 1/2 CIR CATGUT .05X1.09 (NEEDLE) IMPLANT
NDL SCORPION MULTI FIRE (NEEDLE) IMPLANT
NEEDLE 1/2 CIR CATGUT .05X1.09 (NEEDLE) IMPLANT
NEEDLE HYPO 22GX1.5 SAFETY (NEEDLE) ×3 IMPLANT
NEEDLE SCORPION MULTI FIRE (NEEDLE) ×3 IMPLANT
NS IRRIG 500ML POUR BTL (IV SOLUTION) IMPLANT
PACK ARTHROSCOPY DSU (CUSTOM PROCEDURE TRAY) ×3 IMPLANT
PACK BASIN DAY SURGERY FS (CUSTOM PROCEDURE TRAY) ×3 IMPLANT
PAD ABD 8X10 STRL (GAUZE/BANDAGES/DRESSINGS) ×3 IMPLANT
PAD ARMBOARD 7.5X6 YLW CONV (MISCELLANEOUS) ×4 IMPLANT
PENCIL BUTTON HOLSTER BLD 10FT (ELECTRODE) IMPLANT
PORT APPOLLO RF 90DEGREE MULTI (SURGICAL WAND) ×2 IMPLANT
PROBE BIPOLAR ATHRO 135MM 90D (MISCELLANEOUS) ×1 IMPLANT
SET ARTHROSCOPY TUBING (MISCELLANEOUS) ×3
SET ARTHROSCOPY TUBING PVC (MISCELLANEOUS) ×1 IMPLANT
SLEEVE ARM SUSPENSION SYSTEM (MISCELLANEOUS) ×3 IMPLANT
SLING S3 LATERAL DISP (MISCELLANEOUS) IMPLANT
SLING ULTRA II L (ORTHOPEDIC SUPPLIES) ×3 IMPLANT
SLING ULTRA II LARGE (SOFTGOODS) ×2 IMPLANT
SPONGE LAP 4X18 RFD (DISPOSABLE) IMPLANT
SUT 2 FIBERLOOP 20 STRT BLUE (SUTURE)
SUT ETHILON 3 0 PS 1 (SUTURE) ×3 IMPLANT
SUT FIBERWIRE #2 38 T-5 BLUE (SUTURE)
SUT LASSO 45 DEGREE LEFT (SUTURE) IMPLANT
SUT LASSO 45D RIGHT (SUTURE) IMPLANT
SUT PDS AB 0 CT1 36 (SUTURE) IMPLANT
SUT TIGER TAPE 7 IN WHITE (SUTURE) ×2 IMPLANT
SUT VIC AB 0 CT1 36 (SUTURE) IMPLANT
SUT VIC AB 2-0 CT1 27 (SUTURE)
SUT VIC AB 2-0 CT1 TAPERPNT 27 (SUTURE) IMPLANT
SUTURE 2 FIBERLOOP 20 STRT BLU (SUTURE) IMPLANT
SUTURE FIBERWR #2 38 T-5 BLUE (SUTURE) IMPLANT
SUTURE TAPE ×1 IMPLANT
SUTURE TAPE 1.3 40 TPR END (SUTURE) IMPLANT
SUTURETAPE 1.3 40 TPR END (SUTURE) ×3
SYR CONTROL 10ML LL (SYRINGE) ×3 IMPLANT
TAPE CLOTH SURG 6X10 WHT LF (GAUZE/BANDAGES/DRESSINGS) ×2 IMPLANT
TOWEL OR 17X24 6PK STRL BLUE (TOWEL DISPOSABLE) ×6 IMPLANT
TUBE CONNECTING 12'X1/4 (SUCTIONS) ×1
TUBE CONNECTING 12X1/4 (SUCTIONS) ×2 IMPLANT
WATER STERILE IRR 500ML POUR (IV SOLUTION) ×3 IMPLANT

## 2018-04-17 NOTE — Op Note (Signed)
UZHQUIQN#[id number]

## 2018-04-17 NOTE — Discharge Instructions (Signed)

## 2018-04-17 NOTE — Anesthesia Procedure Notes (Signed)
Procedure Name: Intubation Date/Time: 04/17/2018 2:07 PM Performed by: Wanita Chamberlain, CRNA Pre-anesthesia Checklist: Patient identified, Emergency Drugs available, Suction available, Patient being monitored and Timeout performed Patient Re-evaluated:Patient Re-evaluated prior to induction Oxygen Delivery Method: Circle system utilized Preoxygenation: Pre-oxygenation with 100% oxygen Induction Type: IV induction Ventilation: Mask ventilation without difficulty Laryngoscope Size: Mac and 3 Grade View: Grade I Tube type: Oral Tube size: 7.5 mm Number of attempts: 1 Airway Equipment and Method: Stylet Placement Confirmation: ETT inserted through vocal cords under direct vision,  positive ETCO2,  CO2 detector and breath sounds checked- equal and bilateral Secured at: 22 cm Tube secured with: Tape Dental Injury: Teeth and Oropharynx as per pre-operative assessment

## 2018-04-17 NOTE — Anesthesia Postprocedure Evaluation (Signed)
Anesthesia Post Note  Patient: Monica Petty  Procedure(s) Performed: Right shoulder evaluation under anesthesia, scope, debridement, subacromial decompression, rotator cuff repair (Right Shoulder)     Patient location during evaluation: PACU Anesthesia Type: General and Regional Level of consciousness: awake and alert Pain management: pain level controlled Vital Signs Assessment: post-procedure vital signs reviewed and stable Respiratory status: spontaneous breathing, nonlabored ventilation and respiratory function stable Cardiovascular status: blood pressure returned to baseline and stable Postop Assessment: no apparent nausea or vomiting Anesthetic complications: no    Last Vitals:  Vitals:   04/17/18 1600 04/17/18 1615  BP: 135/72 133/65  Pulse: 81 85  Resp: 14 14  Temp:    SpO2: 95% 92%    Last Pain:  Vitals:   04/17/18 1600  TempSrc:   PainSc: 4                  Teleah Villamar,W. EDMOND

## 2018-04-17 NOTE — Interval H&P Note (Signed)
History and Physical Interval Note:  04/17/2018 1:54 PM  [name]  has presented today for surgery, with the diagnosis of Right shoulder rotator cuff tear  The various methods of treatment have been discussed with the patient and family. After consideration of risks, benefits and other options for treatment, the patient has consented to  Procedure(s) with comments: Right shoulder evaluation under anesthesia, scope, debridement, subacromial decompression, rotator cuff repair (Right) - 120 mins General with Intra Scalene Block as a surgical intervention .  The patient's history has been reviewed, patient examined, no change in status, stable for surgery.  I have reviewed the patient's chart and labs.  Questions were answered to the patient's satisfaction.     Monica Petty ANDREW

## 2018-04-17 NOTE — Op Note (Signed)
NAME: Monica Petty, Monica Petty MEDICAL RECORD [id number] ACCOUNT [id number] DATE OF BIRTH:[birth year] FACILITY: [facility] LOCATION: [facility] PHYSICIAN:Tiandra Swoveland Gwinda Passe, MD  OPERATIVE REPORT  DATE OF PROCEDURE:  04/17/2018  PREOPERATIVE DIAGNOSIS:  Right shoulder traumatic tear of supraspinatus, rotator cuff.  POSTOPERATIVE DIAGNOSIS:  Right shoulder traumatic tear of supraspinatus, rotator cuff.  PROCEDURE: 1.  Right shoulder glenohumeral diagnostic arthroscopy. 2.  Arthroscopic subacromial decompression, acromioplasty, bursectomy, coracoacromial ligament release. 3.  Arthroscopic rotator cuff repair.  SURGEON:  Sydnee Cabal, MD  ASSISTANT:  Jerilynn Mages, PA-C.  ANESTHESIA:  Interscalene block, general.  ESTIMATED BLOOD LOSS:  Minimal.  DRAINS:  None.  COMPLICATIONS:  None.  DISPOSITION:  PACU stable.  DESCRIPTION OF PROCEDURE:  The patient and family counseled in the holding area, correct site identified and marked and signed appropriately.  IV started.  A skin block had been administered.  IV antibiotics were given.  He was taken to the operating  room and placed supine position by general anesthesia.  All extremities well-padded bump.  At this point in time she was then placed under general anesthesia.  She was then turned to the left lateral decubitus position, properly padded and blocked  throughout.  Right shoulder examined for range of motion.  She was then prepped with DuraPrep and draped in sterile fashion.  We utilized the Arthrex sterile shoulder holder at 30 degrees of abduction, 10 degrees forward flexion 12 pounds of longitudinal  traction.  Time out done to reconfirm the right side.  Posterior portal was created and arthroscope placed into the glenohumeral joint.  Diagnostic arthroscopy with intact biceps, labrum, rotator cuff and glenohumeral ligaments instability, but there  was a rotator cuff tear involving the entire supraspinatus.  With mild retraction, it  was then mobilized.  The subacromial region revealed a subacromial subdeltoid bursitis.  A lateral portal was established.  Neurovascular structures were protected including axillary nerve and subacromial bursectomy was performed.  The cautery scissors were utilized to  release the periosteum MCL off the CA ligament.  Bur was then placed posteriorly.  Anterior inferior lateral acromioplasty was performed converting to a flat acromial morphology.  AC joint was left alone.  The greater tuberosity was then prepared to  bleeding bone, but first removing soft tissue with cautery and then a bur.  At this point in time, a small puncture was made superiorly.  An Arthrex PEEK anchor was placed into the greater tuberosity.  Appropriate anchor was performed and the surgeon  placed into the supraspinatus.  Arm was abducted with SwiveLock for suture bridge double row technique after being properly mobilized.  There was a small amount of tissue posteriorly.  The suture from the anchor was then placed in this and tied down  nicely giving excellent repair of the rotator cuff down to bleeding bone without excessive tension and tissue appeared to be of excellent quality.  There are no other abnormalities noted.  The arthroscope was removed and taken out of traction.  Normal  pulses.  Portals were closed with running suture.  Another 10 mL of Sensorcaine placed in skin and subacromial region.  Sterile dressing was applied.  She was turned supine, awakened and taken to the operating room PACU in stable condition.  She will be  stabilized in PACU and discharged home.  Jerilynn Mages helped with patient positioning, prepping and draping, technical and surgical assistant throughout the entire case, wound closure, application of dressing and sling.  Mr. Jerilynn Mages, PA-C, assistance was needed.  TN/NUANCE  D:04/17/2018  T:04/17/2018 JOB:[id number]

## 2018-04-17 NOTE — Progress Notes (Signed)
1240 time out for block with Dr Fransisco Beau and nurse. 1241 Versed 2 mg and Fentanyl 2 ml IV given for sedation. 1247 block completed tolerated well. Husband at bedside.

## 2018-04-17 NOTE — Anesthesia Procedure Notes (Signed)
Anesthesia Regional Block: Interscalene brachial plexus block   Pre-Anesthetic Checklist: ,, timeout performed, Correct Patient, Correct Site, Correct Laterality, Correct Procedure, Correct Position, site marked, Risks and benefits discussed,  Surgical consent,  Pre-op evaluation,  At surgeon's request and post-op pain management  Laterality: Right  Prep: chloraprep       Needles:  Injection technique: Single-shot  Needle Type: Echogenic Needle     Needle Length: 5cm  Needle Gauge: 21     Additional Needles:   Narrative:  Start time: 04/17/2018 12:44 PM End time: 04/17/2018 12:48 PM Injection made incrementally with aspirations every 5 mL.  Performed by: Personally  Anesthesiologist: Audry Pili, MD  Additional Notes: No pain on injection. No increased resistance to injection. Injection made in 5cc increments. Good needle visualization. Patient tolerated the procedure well.

## 2018-04-17 NOTE — H&P (Signed)
Monica Petty is an 52 y.o. female.   Chief Complaint: rigth shoulder pain HPI: Patient presents with joint discomfort that had been persistent for several weeks now. Following at injury at work.  Despite conservative treatments, the patients discomfort has not improved. Imaging was obtained. Other conservative and surgical treatments were discussed in detail. Patient wishes to proceed with surgery as consented. Denies SOB, CP, or calf pain. No Fever, chills, or nausea/ vomiting.   Past Medical History:  Diagnosis Date  . Anxiety   . Diverticulosis of colon   . GERD (gastroesophageal reflux disease)    takes okra pepsin supplement  . History of concussion    1987  fell of horse--- no residual  . Hypertension   . Migraine    occasional    Past Surgical History:  Procedure Laterality Date  . COLONOSCOPY  06/06/2016  . LAPAROSCOPIC CHOLECYSTECTOMY  06-03-2008   dr Ulyess Blossom  [facility]  . REDUCTION MAMMAPLASTY Bilateral 03-05-2002   dr barber  [facility]  . VAGINAL HYSTERECTOMY  02-12-2002   dr Nori Riis  [facility]  . WISDOM TOOTH EXTRACTION  [age]    Family History  Problem Relation Age of Onset  . Colon polyps Mother   . Breast cancer Paternal Aunt   . Breast cancer Other   . Colon cancer Neg Hx    Social History:  reports that she has never smoked. She has never used smokeless tobacco. She reports that she drinks about 4.2 oz of alcohol per week. She reports that she does not use drugs.  Allergies:  Allergies  Allergen Reactions  . Codeine Other (See Comments)    tinnitis and photophobia   . Sulfa Antibiotics Other (See Comments)    tinnitis and photophobia  . Penicillins Rash    Medications Prior to Admission  Medication Sig Dispense Refill  . ALPRAZolam (XANAX) 0.5 MG tablet Take 0.5 mg by mouth at bedtime.     . cycloSPORINE (RESTASIS) 0.05 % ophthalmic emulsion Place 1 drop into both eyes 2 (two) times daily.    Marland Kitchen estradiol (ESTRACE) 0.1 MG/GM vaginal cream Place 1 Applicatorful  vaginally 3 (three) times a week.    . estrogens, conjugated, (PREMARIN) 0.625 MG tablet Take 0.625 mg by mouth every morning.     . hydrochlorothiazide (HYDRODIURIL) 25 MG tablet Take 25 mg by mouth every morning.     . Ibuprofen (ADVIL PO) Take by mouth as needed.     Marland Kitchen lisinopril (PRINIVIL,ZESTRIL) 5 MG tablet Take 5 mg by mouth every morning.     . Multiple Vitamins-Minerals (MULTIVITAMIN ADULT PO) Take 1 capsule by mouth daily.     Marland Kitchen OVER THE COUNTER MEDICATION 1 capsule 3 (three) times daily after meals. Okra pepsin    . tretinoin (RETIN-A) 0.025 % gel Apply topically daily.       Results for orders placed or performed during the hospital encounter of 04/17/18 (from the past 48 hour(s))  I-STAT, chem 8     Status: Abnormal   Collection Time: 04/17/18 11:52 AM  Result Value Ref Range   Sodium 141 135 - 145 mmol/L   Potassium 4.7 3.5 - 5.1 mmol/L   Chloride 103 98 - 111 mmol/L   BUN 17 6 - 20 mg/dL   Creatinine, Ser 0.60 0.44 - 1.00 mg/dL   Glucose, Bld 99 70 - 99 mg/dL   Calcium, Ion 1.17 1.15 - 1.40 mmol/L   TCO2 27 22 - 32 mmol/L   Hemoglobin 15.6 (H) 12.0 - 15.0  g/dL   HCT 46.0 36.0 - 46.0 %   No results found.  Review of Systems  Constitutional: Negative.   HENT: Negative.   Eyes: Negative.   Respiratory: Negative.   Cardiovascular: Negative.   Gastrointestinal: Negative.   Genitourinary: Negative.   Musculoskeletal: Positive for joint pain.  Skin: Negative.   Neurological: Negative.   Endo/Heme/Allergies: Negative.   Psychiatric/Behavioral: Negative.     Blood pressure 127/79, pulse 74, temperature 98.7 F (37.1 C), temperature source Oral, resp. rate 16, height 5\' 6"  (1.676 m), weight 85.8 kg (189 lb 3.2 oz), SpO2 99 %. Physical Exam  Constitutional: She is oriented to person, place, and time. She appears well-developed.  HENT:  Head: Normocephalic.  Eyes: EOM are normal.  Neck: Normal range of motion.  Cardiovascular: Normal rate and intact distal pulses.   Respiratory: Effort normal.  GI: Soft.  Genitourinary:  Genitourinary Comments: Deferred  Musculoskeletal:  Right shoulder pain. Limited ROM. Weakness with extremal rotation.   Neurological: She is alert and oriented to person, place, and time.  Skin: Skin is warm and dry.  Psychiatric: Her behavior is normal.     Assessment/Plan Right shoulder RCT: Right shoulder scope as consented D/c home with family F/u in office Follow instructions  Lajean Manes, PA-C 04/17/2018, 1:36 PM

## 2018-04-17 NOTE — Transfer of Care (Signed)
Immediate Anesthesia Transfer of Care Note  Patient: Monica Petty  Procedure(s) Performed: Right shoulder evaluation under anesthesia, scope, debridement, subacromial decompression, rotator cuff repair (Right )  Patient Location: PACU  Anesthesia Type:General  Level of Consciousness: awake, alert , oriented and patient cooperative  Airway & Oxygen Therapy: Patient Spontanous Breathing and Patient connected to nasal cannula oxygen  Post-op Assessment: Report given to RN and Post -op Vital signs reviewed and stable  Post vital signs: Reviewed and stable  Last Vitals:  Vitals Value Taken Time  BP    Temp    Pulse    Resp    SpO2      Last Pain:  Vitals:   04/17/18 1119  TempSrc:   PainSc: 0-No pain      Patients Stated Pain Goal: 7 (73/22/02 5427)  Complications: No apparent anesthesia complications

## 2018-04-17 NOTE — H&P (View-Only) (Signed)
1240 time out for block with Dr Fransisco Beau and nurse. 1241 Versed 2 mg and Fentanyl 2 ml IV given for sedation. 1247 block completed tolerated well. Husband at bedside.

## 2018-04-17 NOTE — Anesthesia Preprocedure Evaluation (Addendum)
Anesthesia Evaluation  Patient identified by MRN, date of birth, ID band Patient awake    Reviewed: Allergy & Precautions, NPO status , Patient's Chart, lab work & pertinent test results  History of Anesthesia Complications Negative for: history of anesthetic complications  Airway Mallampati: II  TM Distance: >3 FB Neck ROM: Full    Dental  (+) Dental Advisory Given, Teeth Intact   Pulmonary neg pulmonary ROS,    breath sounds clear to auscultation       Cardiovascular hypertension, Pt. on medications  Rhythm:Regular Rate:Normal     Neuro/Psych  Headaches, Anxiety    GI/Hepatic Neg liver ROS, GERD  Controlled and Medicated,  Endo/Other   Obesity   Renal/GU negative Renal ROS  negative genitourinary   Musculoskeletal  (+) Arthritis ,   Abdominal   Peds  Hematology negative hematology ROS (+)   Anesthesia Other Findings   Reproductive/Obstetrics                           Anesthesia Physical Anesthesia Plan  ASA: II  Anesthesia Plan: General   Post-op Pain Management:  Regional for Post-op pain   Induction: Intravenous  PONV Risk Score and Plan: 3 and Treatment may vary due to age or medical condition, Ondansetron, Dexamethasone and Midazolam  Airway Management Planned: Oral ETT  Additional Equipment: None  Intra-op Plan:   Post-operative Plan: Extubation in OR  Informed Consent: I have reviewed the patients History and Physical, chart, labs and discussed the procedure including the risks, benefits and alternatives for the proposed anesthesia with the patient or authorized representative who has indicated his/her understanding and acceptance.   Dental advisory given  Plan Discussed with: CRNA and Anesthesiologist  Anesthesia Plan Comments:        Anesthesia Quick Evaluation

## 2018-04-20 ENCOUNTER — Encounter (HOSPITAL_BASED_OUTPATIENT_CLINIC_OR_DEPARTMENT_OTHER): Payer: Self-pay | Admitting: Specialist

## 2018-04-25 ENCOUNTER — Ambulatory Visit: Payer: PRIVATE HEALTH INSURANCE | Admitting: Physical Therapy

## 2018-04-26 ENCOUNTER — Encounter: Payer: Self-pay | Admitting: Physical Therapy

## 2018-04-26 ENCOUNTER — Ambulatory Visit: Payer: PRIVATE HEALTH INSURANCE | Attending: Nurse Practitioner | Admitting: Physical Therapy

## 2018-04-26 DIAGNOSIS — R29898 Other symptoms and signs involving the musculoskeletal system: Secondary | ICD-10-CM | POA: Insufficient documentation

## 2018-04-26 DIAGNOSIS — M6281 Muscle weakness (generalized): Secondary | ICD-10-CM | POA: Insufficient documentation

## 2018-04-26 DIAGNOSIS — M25611 Stiffness of right shoulder, not elsewhere classified: Secondary | ICD-10-CM | POA: Diagnosis present

## 2018-04-26 DIAGNOSIS — M25511 Pain in right shoulder: Secondary | ICD-10-CM | POA: Diagnosis present

## 2018-04-26 NOTE — Therapy (Signed)
[facility] [address]  [place], [place], [address] Phone: [phone]   Fax:  [phone]  Physical Therapy Evaluation  Patient Details  Name: Monica Petty MRN: [id number] Date of Birth: 1966-02-24 Referring Provider: Sydnee Cabal, MD   Encounter Date: 04/26/2018  PT End of Session - 04/26/18 1647    Visit Number  5    Number of Visits  18    Date for PT Re-Evaluation  06/07/18    Authorization Type  WC    Authorization Time Period  6 visits pre-op; 12 visits post-op after 08/16    Authorization - Visit Number  1    Authorization - Number of Visits  12    PT Start Time  1615    PT Stop Time  1641    PT Time Calculation (min)  26 min    Activity Tolerance  Patient tolerated treatment well;Patient limited by pain    Behavior During Therapy  [facility] for tasks assessed/performed       Past Medical History:  Diagnosis Date  . Anxiety   . Diverticulosis of colon   . GERD (gastroesophageal reflux disease)    takes okra pepsin supplement  . History of concussion    1987  fell of horse--- no residual  . Hypertension   . Migraine    occasional    Past Surgical History:  Procedure Laterality Date  . COLONOSCOPY  06/06/2016  . LAPAROSCOPIC CHOLECYSTECTOMY  06-03-2008   dr Ulyess Blossom  [facility]  . REDUCTION MAMMAPLASTY Bilateral 03-05-2002   dr barber  [facility]  . SHOULDER ARTHROSCOPY WITH ROTATOR CUFF REPAIR AND SUBACROMIAL DECOMPRESSION Right 04/17/2018   Procedure: Right shoulder evaluation under anesthesia, scope, debridement, subacromial decompression, rotator cuff repair;  Surgeon: Sydnee Cabal, MD;  Location: [facility];  Service: Orthopedics;  Laterality: Right;  120 mins General with Intra Scalene Block  . VAGINAL HYSTERECTOMY  02-12-2002   dr Nori Riis  [facility]  . WISDOM TOOTH EXTRACTION  teen    There were no vitals filed for this visit.   Subjective Assessment - 04/26/18 1616    Subjective   Patient reports having R RTC repair on 04/17/18. Reports she had follow up appointment with MD on 04/23/18. MD advised her to stay in sling for 6 weeks, can take out pillow after 3 weeks. 3 times a day wants patient to perform elbow and wrist AROM and take out of sling when sitting and not using arm, no driving.  Using Neosporin and band aids on incision. No longer on pain meds- only ibuprofen and Robaxin.    Limitations  Lifting;House hold activities    Diagnostic tests  02/14/18 R shoulder MRI: There is a full-thickness retracted tear of the supraspinatus tendon. Maximum retraction is 16.5 mm and the tear is 14 mm wide. Mild tendinopathy involving the infraspinatus tendon probable small intrasubstance tears. The subscapularis tendon is intact.    Patient Stated Goals  keep strengthening and mobility of R shoulder    Currently in Pain?  Yes    Pain Score  3     Pain Location  Shoulder    Pain Orientation  Right    Pain Descriptors / Indicators  Aching    Pain Type  Acute pain         [facility] PT Assessment - 04/26/18 1621      Assessment   Medical Diagnosis  Traumatic rupture of RTC (R)    Referring Provider  Sydnee Cabal, MD    Onset Date/Surgical Date  04/17/18    Hand Dominance  Right    Next MD Visit  05/16/18    Prior Therapy  Yes- for shoulder      Precautions   Precautions  Shoulder    Type of Shoulder Precautions  no lifting, excessive shoulder extension, jerking, supporting body weight by hands      Restrictions   Weight Bearing Restrictions  Yes    RUE Weight Bearing  Non weight bearing      Balance Screen   Has the patient fallen in the past 6 months  No    Has the patient had a decrease in activity level because of a fear of falling?   No    Is the patient reluctant to leave their home because of a fear of falling?   No      Home Social worker  Private residence    Living Arrangements  Spouse/significant other;Children    Available Help at  Discharge  Family    Type of [place] to enter    Entrance Stairs-Number of Steps  10    Entrance Stairs-Rails  None    Home Layout  Two level    Alternate Level Stairs-Number of Steps  13    Alternate Level Stairs-Rails  Left    Home Equipment  None      Prior Function   Level of Independence  Independent    Vocation  Full time employment    Vocation Requirements  OR nurse - lifting, pushing, lifitng patients      Cognition   Overall Cognitive Status  Within Functional Limits for tasks assessed      Sensation   Light Touch  Appears Intact      Coordination   Gross Motor Movements are Fluid and Coordinated  Yes      Posture/Postural Control   Posture/Postural Control  Postural limitations    Postural Limitations  Rounded Shoulders      PROM   PROM Assessment Site  Shoulder    Right/Left Shoulder  Right    Right Shoulder Flexion  84 Degrees    Right Shoulder Internal Rotation  25 Degrees   pain along anterior and posterior delt   Right Shoulder External Rotation  0 Degrees   pain along anterior and posterior delt     Strength   Left Shoulder Flexion  4+/5    Left Shoulder ABduction  4+/5    Left Shoulder Internal Rotation  4+/5    Left Shoulder External Rotation  4/5      Palpation   Palpation comment  No tenderness                Objective measurements completed on examination: See above findings.              PT Education - 04/26/18 1647    Education provided  Yes    Education Details  prognosis, POC, HEP    Person(s) Educated  Patient    Methods  Explanation;Demonstration;Tactile cues;Verbal cues;Handout    Comprehension  Returned demonstration;Verbalized understanding       PT Short Term Goals - 04/26/18 1652      PT SHORT TERM GOAL #1   Title  Patient to be independent with initial HEP.    Time  3    Period  Weeks    Status  New  Target Date  05/17/18        PT Long Term Goals - 04/26/18 1652       PT LONG TERM GOAL #1   Title  patient to be independent with advanced HEP    Time  6    Period  Weeks    Status  New   met for current   Target Date  06/07/18      PT LONG TERM GOAL #2   Title  patient to demonstrate full and non-painful AROM (including multi-planar motions) without pain    Time  9    Period  Weeks    Status  New   still presents with painful R shoulder AROM in all planes   Target Date  06/07/18      PT LONG TERM GOAL #3   Title  patient to improve R shoulder strength to >/= 4+/5 without pain    Time  6    Period  Weeks    Status  New   held until post-surgery   Target Date  06/07/18      PT LONG TERM GOAL #4   Title  Patient to demonstrate overhead reaching to place 5# object on shelf overhead with <=1/10 pain.    Time  6    Period  Weeks    Status  New   held until post-surgery   Target Date  06/07/18      PT LONG TERM GOAL #5   Title  Patient to report tolerance of 1 full shift at work without pain.    Time  6    Period  Weeks    Status  On-going   held until post-surgery   Target Date  06/07/18             Plan - 04/26/18 1648    Clinical Impression Statement  Patient is a 52y/o F presenting to OPPT after R RTC tear on 04/17/18. Currently using sling with pillow on R UE- MD advised to stay in sling for 6 weeks, can take out pillow after 3 weeks. Patient able to recall surgical precautions. Reminded patient to avoid soaking incision or using lotions. Patient reported understanding. Patient today with limited and painful PROM. Unable to test strength or AROM per protocol. Reviewed post-op HEP administered last session. Advised patient to hold off on IR/ER AAROM with cane. Educated on gentle elbow and wrist ROM HEP. Patient reported understanding. Would benefit from skilled PT services 2x/week for 6 weeks to address aforementioned impairments.     Rehab Potential  Good    PT Frequency  2x / week    PT Duration  6 weeks    PT  Treatment/Interventions  ADLs/Self Care Home Management;Cryotherapy;Electrical Stimulation;Moist Heat;Therapeutic exercise;Therapeutic activities;Neuromuscular re-education;Patient/family education;Manual techniques;Vasopneumatic Device;Taping;Dry needling;Passive range of motion;Ultrasound;Splinting;Energy conservation;Scar mobilization    Consulted and Agree with Plan of Care  Patient       Patient will benefit from skilled therapeutic intervention in order to improve the following deficits and impairments:  Pain, Impaired UE functional use, Decreased strength, Decreased range of motion, Decreased activity tolerance, Decreased scar mobility, Postural dysfunction, Impaired flexibility  Visit Diagnosis: Acute pain of right shoulder  Stiffness of right shoulder, not elsewhere classified  Muscle weakness (generalized)  Other symptoms and signs involving the musculoskeletal system     Problem List Patient Active Problem List   Diagnosis Date Noted  . S/P arthroscopy of right shoulder 04/17/2018    Janene Harvey, PT, DPT 04/26/18 4:55  PM   [facility] [address]  [place], [place], [address] Phone: [phone]   Fax:  [phone]  Name: Monica Petty MRN: [id number] Date of Birth: 28-Jan-1966

## 2018-04-27 ENCOUNTER — Encounter: Payer: Self-pay | Admitting: Physical Therapy

## 2018-05-01 ENCOUNTER — Ambulatory Visit: Payer: PRIVATE HEALTH INSURANCE | Admitting: Physical Therapy

## 2018-05-01 DIAGNOSIS — M25511 Pain in right shoulder: Secondary | ICD-10-CM

## 2018-05-01 DIAGNOSIS — M6281 Muscle weakness (generalized): Secondary | ICD-10-CM

## 2018-05-01 DIAGNOSIS — M25611 Stiffness of right shoulder, not elsewhere classified: Secondary | ICD-10-CM

## 2018-05-01 DIAGNOSIS — R29898 Other symptoms and signs involving the musculoskeletal system: Secondary | ICD-10-CM

## 2018-05-01 NOTE — Therapy (Signed)
[facility] [place] [address]  [place], [place], [address] Phone: [phone]   Fax:  [phone]  Physical Therapy Treatment  Patient Details  Name: Monica Petty MRN: [id number] Date of Birth: December 13, 1965 Referring Provider: Sydnee Cabal, MD   Encounter Date: 05/01/2018  PT End of Session - 05/01/18 1704    Visit Number  6    Number of Visits  18    Date for PT Re-Evaluation  06/07/18    Authorization Type  WC    Authorization Time Period  6 visits pre-op; 12 visits post-op after 08/16    Authorization - Visit Number  2    Authorization - Number of Visits  12    PT Start Time  1610    PT Stop Time  1706    PT Time Calculation (min)  56 min    Activity Tolerance  Patient tolerated treatment well;Patient limited by pain    Behavior During Therapy  [facility] for tasks assessed/performed       Past Medical History:  Diagnosis Date  . Anxiety   . Diverticulosis of colon   . GERD (gastroesophageal reflux disease)    takes okra pepsin supplement  . History of concussion    1987  fell of horse--- no residual  . Hypertension   . Migraine    occasional    Past Surgical History:  Procedure Laterality Date  . COLONOSCOPY  06/06/2016  . LAPAROSCOPIC CHOLECYSTECTOMY  06-03-2008   dr Ulyess Blossom  [facility]  . REDUCTION MAMMAPLASTY Bilateral 03-05-2002   dr barber  [facility]  . SHOULDER ARTHROSCOPY WITH ROTATOR CUFF REPAIR AND SUBACROMIAL DECOMPRESSION Right 04/17/2018   Procedure: Right shoulder evaluation under anesthesia, scope, debridement, subacromial decompression, rotator cuff repair;  Surgeon: Sydnee Cabal, MD;  Location: [facility];  Service: Orthopedics;  Laterality: Right;  120 mins General with Intra Scalene Block  . VAGINAL HYSTERECTOMY  02-12-2002   dr Nori Riis  [facility]  . WISDOM TOOTH EXTRACTION  teen    There were no vitals filed for this visit.  Subjective Assessment - 05/01/18 1611    Subjective   Patient reports compliance with HEP but some exercises are harder than others.     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 Maximum retraction is 16.5 mm and the tear is 14 mm wide. Mild tendinopathy involving the infraspinatus tendon probable small intrasubstance tears. The subscapularis tendon is intact.    Patient Stated Goals  keep strengthening and mobility of R shoulder    Currently in Pain?  Yes    Pain Score  2     Pain Location  Shoulder    Pain Orientation  Right    Pain Descriptors / Indicators  Throbbing    Pain Type  Acute pain                       [facility] Adult PT Treatment/Exercise - 05/01/18 0001      Exercises   Exercises  Elbow;Neck      Elbow Exercises   Elbow Flexion  AROM;Right;10 reps;Standing;Limitations    Elbow Flexion Limitations  10x elbow in neutral; 10x supinated      Shoulder Exercises: Supine   Protraction  AAROM;15 reps;Both    Protraction Limitations  wand; towel roll between elbow; R UE propped on folded pillow      Shoulder Exercises: Standing  Other Standing Exercises  Pendulum: CC/CCW/ant-pos/M/L 30sec      Shoulder Exercises: Isometric Strengthening   Flexion  5X10"    Flexion Limitations  10% effort with dowel under elbow    External Rotation  5X10";Limitations    External Rotation Limitations  10% effort with dowel under elbow    Internal Rotation  5X10";Limitations    Internal Rotation Limitations  10% effort with dowel under elbow      Vasopneumatic   Number Minutes Vasopneumatic   15 minutes    Vasopnuematic Location   Shoulder   R   Vasopneumatic Pressure  Low    Vasopneumatic Temperature   Coldest      Manual Therapy   Manual Therapy  Soft tissue mobilization;Passive ROM    Manual therapy comments  Supine    Soft tissue mobilization  R UT, bicep, posterior delt- TTP in these areas    Passive ROM  R shoulder PROM in scapular plane with  folded pillow under UE; avoiding ABD      Neck Exercises: Stretches   Upper Trapezius Stretch  Right;2 reps;20 seconds    Upper Trapezius Stretch Limitations  to tolerance    Levator Stretch  2 reps;20 seconds;Limitations    Levator Stretch Limitations  to tolerance             PT Education - 05/01/18 1659    Education provided  Yes    Education Details  update to [employer]) Educated  Patient    Methods  Explanation;Demonstration;Tactile cues;Verbal cues;Handout    Comprehension  Returned demonstration;Verbalized understanding       PT Short Term Goals - 04/26/18 1652      PT SHORT TERM GOAL #1   Title  Patient to be independent with initial HEP.    Time  3    Period  Weeks    Status  New    Target Date  05/17/18        PT Long Term Goals - 04/26/18 1652      PT LONG TERM GOAL #1   Title  patient to be independent with advanced HEP    Time  6    Period  Weeks    Status  New   met for current   Target Date  06/07/18      PT LONG TERM GOAL #2   Title  patient to demonstrate full and non-painful AROM (including multi-planar motions) without pain    Time  9    Period  Weeks    Status  New   still presents with painful R shoulder AROM in all planes   Target Date  06/07/18      PT LONG TERM GOAL #3   Title  patient to improve R shoulder strength to >/= 4+/5 without pain    Time  6    Period  Weeks    Status  New   held until post-surgery   Target Date  06/07/18      PT LONG TERM GOAL #4   Title  Patient to demonstrate overhead reaching to place 5# object on shelf overhead with <=1/10 pain.    Time  6    Period  Weeks    Status  New   held until post-surgery   Target Date  06/07/18      PT LONG TERM GOAL #5   Title  Patient to report tolerance of 1 full shift at work without pain.  Time  6    Period  Weeks    Status  On-going   held until post-surgery   Target Date  06/07/18            Plan - 05/01/18 1748    Clinical Impression  Statement  Patient arrived to session with no new complaints. Reports compliance with HEP- noting some discomfort with elbow AROM. Reviewed this exercise with patient- advised patient to perform this activity in standing with R shoulder relaxed down as patient reporting pain with full adduction with elbow supported. Patient reporting understanding and able to perform with elbow in neutral and supination without pain. R shoulder slightly edematous and warm- advised patient to continue icing at home and to watch for signs of infection. Tolerated STM to R UT, bicep, posterior delt- TTP in these areas. Patient able to tolerate PROM of R shoulder in scapular plane within non-painful range. Reviewed and performed R shoulder AAROM ER with wand- patient with good form and tolerance. Advised to continue this exercise at home. Patient reported understanding. Added UT and LS stretching to HEP and administered handout. Patient received Gameready to R shoulder at end of session; normal integumentary response noted.     PT Treatment/Interventions  ADLs/Self Care Home Management;Cryotherapy;Electrical Stimulation;Moist Heat;Therapeutic exercise;Therapeutic activities;Neuromuscular re-education;Patient/family education;Manual techniques;Vasopneumatic Device;Taping;Dry needling;Passive range of motion;Ultrasound;Splinting;Energy conservation;Scar mobilization    Consulted and Agree with Plan of Care  Patient       Patient will benefit from skilled therapeutic intervention in order to improve the following deficits and impairments:  Pain, Impaired UE functional use, Decreased strength, Decreased range of motion, Decreased activity tolerance, Decreased scar mobility, Postural dysfunction, Impaired flexibility  Visit Diagnosis: Acute pain of right shoulder  Stiffness of right shoulder, not elsewhere classified  Muscle weakness (generalized)  Other symptoms and signs involving the musculoskeletal system     Problem  List Patient Active Problem List   Diagnosis Date Noted  . S/P arthroscopy of right shoulder 04/17/2018    Janene Harvey, PT, DPT 05/01/18 5:59 PM   [facility] [place] [address]  [place], [place], [address] Phone: [phone]   Fax:  [phone]  Name: Monica Petty MRN: [id number] Date of Birth: 1966-03-12

## 2018-05-03 ENCOUNTER — Ambulatory Visit: Payer: PRIVATE HEALTH INSURANCE | Admitting: Physical Therapy

## 2018-05-03 DIAGNOSIS — R29898 Other symptoms and signs involving the musculoskeletal system: Secondary | ICD-10-CM

## 2018-05-03 DIAGNOSIS — M25511 Pain in right shoulder: Secondary | ICD-10-CM

## 2018-05-03 DIAGNOSIS — M6281 Muscle weakness (generalized): Secondary | ICD-10-CM

## 2018-05-03 DIAGNOSIS — M25611 Stiffness of right shoulder, not elsewhere classified: Secondary | ICD-10-CM

## 2018-05-03 NOTE — Therapy (Signed)
[facility] [place] [address]  [address] [place], , [address] Phone: [phone]   Fax:  [phone]  Physical Therapy Treatment  Patient Details  Name: Monica Petty MRN: [id number] Date of Birth: 08/18/1966 Referring Provider: Robert Collins, MD   Encounter Date: 05/03/2018  PT End of Session - 05/03/18 1647    Visit Number  7    Number of Visits  18    Date for PT Re-Evaluation  06/07/18    Authorization Type  WC    Authorization Time Period  6 visits pre-op; 12 visits post-op after 08/16    Authorization - Visit Number  3    Authorization - Number of Visits  12    PT Start Time  1609    PT Stop Time  1659    PT Time Calculation (min)  50 min    Activity Tolerance  Patient tolerated treatment well;Patient limited by pain    Behavior During Therapy  [facility] for tasks assessed/performed       Past Medical History:  Diagnosis Date  . Anxiety   . Diverticulosis of colon   . GERD (gastroesophageal reflux disease)    takes okra pepsin supplement  . History of concussion    1987  fell of horse--- no residual  . Hypertension   . Migraine    occasional    Past Surgical History:  Procedure Laterality Date  . COLONOSCOPY  06/06/2016  . LAPAROSCOPIC CHOLECYSTECTOMY  06-03-2008   dr rosebower  [facility]  . REDUCTION MAMMAPLASTY Bilateral 03-05-2002   dr barber  [facility]  . SHOULDER ARTHROSCOPY WITH ROTATOR CUFF REPAIR AND SUBACROMIAL DECOMPRESSION Right 04/17/2018   Procedure: Right shoulder evaluation under anesthesia, scope, debridement, subacromial decompression, rotator cuff repair;  Surgeon: Collins, Robert, MD;  Location: [facility];  Service: Orthopedics;  Laterality: Right;  120 mins General with Intra Scalene Block  . VAGINAL HYSTERECTOMY  02-12-2002   dr neal  [facility]  . WISDOM TOOTH EXTRACTION  teen    There were no vitals filed for this visit.  Subjective Assessment - 05/03/18 1610    Subjective   Reports no issues today. Able to do HEP without problems.     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 Maximum retraction is 16.5 mm and the tear is 14 mm wide. Mild tendinopathy involving the infraspinatus tendon probable small intrasubstance tears. The subscapularis tendon is intact.    Patient Stated Goals  keep strengthening and mobility of R shoulder    Currently in Pain?  No/denies                       [facility] Adult PT Treatment/Exercise - 05/03/18 0001      Exercises   Exercises  Elbow;Neck;Shoulder      Elbow Exercises   Elbow Flexion  AROM;Right;10 reps;Standing;Limitations   focus on slow speed   Elbow Flexion Limitations  supinated, neutral, pronated 10x each      Shoulder Exercises: Supine   External Rotation  AAROM;Both;Limitations;15 reps   cues to decrease speed   External Rotation Limitations  wand; towel roll between elbow; R UE propped on folded pillow      Shoulder Exercises: Standing   Other Standing Exercises  scapular retractions 15x3"   cues to avoid shoulder elevation   Other Standing Exercises  Pendulum: CC/CCW/ant-pos/M/L 30sec      Vasopneumatic     Number Minutes Vasopneumatic   15 minutes    Vasopnuematic Location   Shoulder   R   Vasopneumatic Pressure  Low    Vasopneumatic Temperature   Coldest      Manual Therapy   Manual Therapy  Soft tissue mobilization;Passive ROM    Manual therapy comments  Supine    Soft tissue mobilization  R UT, deltoid, bicep- mild soft tissue restriction in UT    Passive ROM  Slow R shoulder PROM in scapular plane with folded pillow under UE; avoiding ABD; prolonged holds at tolerable range             PT Education - 05/03/18 1647    Education provided  Yes    Education Details  update to HEP    Person(s) Educated  Patient    Methods  Explanation;Demonstration;Tactile cues;Verbal cues;Handout    Comprehension  Verbalized  understanding;Returned demonstration       PT Short Term Goals - 05/03/18 1652      PT SHORT TERM GOAL #1   Title  Patient to be independent with initial HEP.    Time  3    Period  Weeks    Status  Achieved        PT Long Term Goals - 04/26/18 1652      PT LONG TERM GOAL #1   Title  patient to be independent with advanced HEP    Time  6    Period  Weeks    Status  New   met for current   Target Date  06/07/18      PT LONG TERM GOAL #2   Title  patient to demonstrate full and non-painful AROM (including multi-planar motions) without pain    Time  9    Period  Weeks    Status  New   still presents with painful R shoulder AROM in all planes   Target Date  06/07/18      PT LONG TERM GOAL #3   Title  patient to improve R shoulder strength to >/= 4+/5 without pain    Time  6    Period  Weeks    Status  New   held until post-surgery   Target Date  06/07/18      PT LONG TERM GOAL #4   Title  Patient to demonstrate overhead reaching to place 5# object on shelf overhead with <=1/10 pain.    Time  6    Period  Weeks    Status  New   held until post-surgery   Target Date  06/07/18      PT LONG TERM GOAL #5   Title  Patient to report tolerance of 1 full shift at work without pain.    Time  6    Period  Weeks    Status  On-going   held until post-surgery   Target Date  06/07/18            Plan - 05/03/18 1647    Clinical Impression Statement  Patient arrived to session with no pain and still using sling. Reporting compliance with HEP with no issues. Patient tolerated STM to R UT, deltoid, bicep- mild soft tissue restriction in UT, otherwise normal today. Significant focus on R shoulder PROM this session. Patient limited by pain but with better tolerance with very slow movement progression and prolonged holds to avoid muscle guarding. Patient with good carryover of AAROM ER/IR today. Worked on R elbow flexion AROM in   supination, neutral, and pronation. Patient  reporting muscle burn with this activity but tolerable. Requested Gameready to R shoulder at end of session for pain relief. Normal integumentary response noted.     PT Treatment/Interventions  ADLs/Self Care Home Management;Cryotherapy;Electrical Stimulation;Moist Heat;Therapeutic exercise;Therapeutic activities;Neuromuscular re-education;Patient/family education;Manual techniques;Vasopneumatic Device;Taping;Dry needling;Passive range of motion;Ultrasound;Splinting;Energy conservation;Scar mobilization    Consulted and Agree with Plan of Care  Patient       Patient will benefit from skilled therapeutic intervention in order to improve the following deficits and impairments:  Pain, Impaired UE functional use, Decreased strength, Decreased range of motion, Decreased activity tolerance, Decreased scar mobility, Postural dysfunction, Impaired flexibility  Visit Diagnosis: Acute pain of right shoulder  Stiffness of right shoulder, not elsewhere classified  Muscle weakness (generalized)  Other symptoms and signs involving the musculoskeletal system     Problem List Patient Active Problem List   Diagnosis Date Noted  . S/P arthroscopy of right shoulder 04/17/2018     Yevgeniya Kovalenko, PT, DPT 05/03/18 5:31 PM   [facility] [place] [address]  [address] [place], [place], [address] Phone: [phone]   Fax:  [phone]  Name: Monica Petty MRN: [id number] Date of Birth: 12/18/1965   

## 2018-05-04 MED FILL — METHOCARBAMOL 500 MG TABLET: 500 | 16 days supply | Qty: 50 | Fill #1

## 2018-05-08 ENCOUNTER — Ambulatory Visit: Payer: PRIVATE HEALTH INSURANCE | Admitting: Physical Therapy

## 2018-05-08 DIAGNOSIS — M6281 Muscle weakness (generalized): Secondary | ICD-10-CM

## 2018-05-08 DIAGNOSIS — R29898 Other symptoms and signs involving the musculoskeletal system: Secondary | ICD-10-CM

## 2018-05-08 DIAGNOSIS — M25511 Pain in right shoulder: Secondary | ICD-10-CM

## 2018-05-08 DIAGNOSIS — M25611 Stiffness of right shoulder, not elsewhere classified: Secondary | ICD-10-CM

## 2018-05-08 NOTE — Therapy (Signed)
[facility] [place] [address]  [place], [place], [address] Phone: [phone]   Fax:  [phone]  Physical Therapy Treatment  Patient Details  Name: Monica Petty MRN: [id number] Date of Birth: 08-Feb-1966 Referring Provider: Sydnee Cabal, MD   Encounter Date: 05/08/2018  PT End of Session - 05/08/18 1759    Visit Number  8    Number of Visits  18    Date for PT Re-Evaluation  06/07/18    Authorization Type  WC    Authorization Time Period  6 visits pre-op; 12 visits post-op after 08/16    Authorization - Visit Number  4    Authorization - Number of Visits  12    PT Start Time  1610    PT Stop Time  1703    PT Time Calculation (min)  53 min    Activity Tolerance  Patient tolerated treatment well    Behavior During Therapy  [facility] for tasks assessed/performed       Past Medical History:  Diagnosis Date  . Anxiety   . Diverticulosis of colon   . GERD (gastroesophageal reflux disease)    takes okra pepsin supplement  . History of concussion    1987  fell of horse--- no residual  . Hypertension   . Migraine    occasional    Past Surgical History:  Procedure Laterality Date  . COLONOSCOPY  06/06/2016  . LAPAROSCOPIC CHOLECYSTECTOMY  06-03-2008   dr Ulyess Blossom  [facility]  . REDUCTION MAMMAPLASTY Bilateral 03-05-2002   dr barber  [facility]  . SHOULDER ARTHROSCOPY WITH ROTATOR CUFF REPAIR AND SUBACROMIAL DECOMPRESSION Right 04/17/2018   Procedure: Right shoulder evaluation under anesthesia, scope, debridement, subacromial decompression, rotator cuff repair;  Surgeon: Sydnee Cabal, MD;  Location: [facility];  Service: Orthopedics;  Laterality: Right;  120 mins General with Intra Scalene Block  . VAGINAL HYSTERECTOMY  02-12-2002   dr Nori Riis  [facility]  . WISDOM TOOTH EXTRACTION  teen    There were no vitals filed for this visit.  Subjective Assessment - 05/08/18 1610    Subjective  Patient reports that she is  feeling better about holding her arm up.     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 Maximum retraction is 16.5 mm and the tear is 14 mm wide. Mild tendinopathy involving the infraspinatus tendon probable small intrasubstance tears. The subscapularis tendon is intact.    Patient Stated Goals  keep strengthening and mobility of R shoulder    Currently in Pain?  No/denies                       [facility] Adult PT Treatment/Exercise - 05/08/18 0001      Exercises   Exercises  Shoulder      Shoulder Exercises: Supine   External Rotation  AAROM;Both;Limitations;20 reps    External Rotation Limitations  wand; towel roll between elbow; R UE propped on folded pillow      Shoulder Exercises: Seated   Retraction  Strengthening;Both;10 reps;Limitations    Retraction Limitations  10x3"     Other Seated Exercises  cervical retraction + patient OP x10      Shoulder Exercises: Standing   Other Standing Exercises  Pendulum: CC/CCW/ant-pos/M/L 30sec      Shoulder Exercises: Isometric Strengthening   Flexion  5X10"    Flexion Limitations  10% effort with towel roll  under elbow    External Rotation  5X10";Limitations    External Rotation Limitations  10% effort with towel roll under elbow      Vasopneumatic   Number Minutes Vasopneumatic   15 minutes    Vasopnuematic Location   Shoulder   R   Vasopneumatic Pressure  Low    Vasopneumatic Temperature   Coldest      Manual Therapy   Manual Therapy  Joint mobilization;Passive ROM    Manual therapy comments  Supine    Joint Mobilization  gentle grade II joint mobs to R shoulder in anterior, posterior, inferior directions to tolerance    Passive ROM  Slow R shoulder PROM in scapular plane with folded pillow under UE; avoiding ABD; prolonged holds at tolerable range             PT Education - 05/08/18 1759    Education provided  Yes    Education Details   update to [employer]) Educated  Patient    Methods  Explanation;Demonstration;Tactile cues;Verbal cues;Handout    Comprehension  Returned demonstration;Verbalized understanding       PT Short Term Goals - 05/03/18 1652      PT SHORT TERM GOAL #1   Title  Patient to be independent with initial HEP.    Time  3    Period  Weeks    Status  Achieved        PT Long Term Goals - 04/26/18 1652      PT LONG TERM GOAL #1   Title  patient to be independent with advanced HEP    Time  6    Period  Weeks    Status  New   met for current   Target Date  06/07/18      PT LONG TERM GOAL #2   Title  patient to demonstrate full and non-painful AROM (including multi-planar motions) without pain    Time  9    Period  Weeks    Status  New   still presents with painful R shoulder AROM in all planes   Target Date  06/07/18      PT LONG TERM GOAL #3   Title  patient to improve R shoulder strength to >/= 4+/5 without pain    Time  6    Period  Weeks    Status  New   held until post-surgery   Target Date  06/07/18      PT LONG TERM GOAL #4   Title  Patient to demonstrate overhead reaching to place 5# object on shelf overhead with <=1/10 pain.    Time  6    Period  Weeks    Status  New   held until post-surgery   Target Date  06/07/18      PT LONG TERM GOAL #5   Title  Patient to report tolerance of 1 full shift at work without pain.    Time  6    Period  Weeks    Status  On-going   held until post-surgery   Target Date  06/07/18            Plan - 05/08/18 1801    Clinical Impression Statement  Patient arrived to session with no new complaints. Reports that she started icing R shoulder at home and has noticed that it is less hot to the touch. Also reports decreased sensitivity/improved comfort in the shoulder. Per patient's report- MD to allow transition to  sling without abduction pillow at 3 weeks post-op. Adjusted patient's sling in proper position and for most comfort.  Patient reported no discomfort or pain in adjusted sling. Patient tolerated R shoulder PROM into flexion, IR, ER in scapular plane with prolonged holds at end range. Patient with visible improvement in tolerance of ER this date. Also tolerated gentle R shoulder mobilizations with mild report of tenderness to touch over incisions. Instructed patient on cervical retractions with patient OP- added this exercise to HEP and administered handout. Patient reported understanding. Adjusted form during ER isometric to ensure shoulder at neutral as patient reporting some pain with this exercise at home. Report of improved tolerance after cueing. Received Gameready to R shoulder at end of session for pain relief. Normal integumentary response noted at end of session.     PT Treatment/Interventions  ADLs/Self Care Home Management;Cryotherapy;Electrical Stimulation;Moist Heat;Therapeutic exercise;Therapeutic activities;Neuromuscular re-education;Patient/family education;Manual techniques;Vasopneumatic Device;Taping;Dry needling;Passive range of motion;Ultrasound;Splinting;Energy conservation;Scar mobilization    Consulted and Agree with Plan of Care  Patient       Patient will benefit from skilled therapeutic intervention in order to improve the following deficits and impairments:  Pain, Impaired UE functional use, Decreased strength, Decreased range of motion, Decreased activity tolerance, Decreased scar mobility, Postural dysfunction, Impaired flexibility  Visit Diagnosis: Acute pain of right shoulder  Stiffness of right shoulder, not elsewhere classified  Muscle weakness (generalized)  Other symptoms and signs involving the musculoskeletal system     Problem List Patient Active Problem List   Diagnosis Date Noted  . S/P arthroscopy of right shoulder 04/17/2018    Janene Harvey, PT, DPT 05/08/18 6:05 PM   [facility] [place] [address]   [place], [place], [address] Phone: [phone]   Fax:  [phone]  Name: CARLTON SWEANEY MRN: [id number] Date of Birth: 1965-12-31

## 2018-05-10 ENCOUNTER — Ambulatory Visit: Payer: PRIVATE HEALTH INSURANCE | Admitting: Physical Therapy

## 2018-05-10 DIAGNOSIS — M6281 Muscle weakness (generalized): Secondary | ICD-10-CM

## 2018-05-10 DIAGNOSIS — M25611 Stiffness of right shoulder, not elsewhere classified: Secondary | ICD-10-CM

## 2018-05-10 DIAGNOSIS — M25511 Pain in right shoulder: Secondary | ICD-10-CM | POA: Diagnosis not present

## 2018-05-10 DIAGNOSIS — R29898 Other symptoms and signs involving the musculoskeletal system: Secondary | ICD-10-CM

## 2018-05-10 NOTE — Therapy (Signed)
[facility] [address]  [place], [place], [address] Phone: [phone]   Fax:  [phone]  Physical Therapy Treatment  Patient Details  Name: Monica Petty MRN: [id number] Date of Birth: December 28, 1965 Referring Provider: Sydnee Cabal, MD   Encounter Date: 05/10/2018  PT End of Session - 05/10/18 1640    Visit Number  9    Number of Visits  18    Date for PT Re-Evaluation  06/07/18    Authorization Type  WC    Authorization Time Period  6 visits pre-op; 12 visits post-op after 08/16    Authorization - Visit Number  5    Authorization - Number of Visits  12    PT Start Time  6962    PT Stop Time  9528    PT Time Calculation (min)  43 min    Activity Tolerance  Patient limited by pain;Patient tolerated treatment well    Behavior During Therapy  [facility] for tasks assessed/performed       Past Medical History:  Diagnosis Date  . Anxiety   . Diverticulosis of colon   . GERD (gastroesophageal reflux disease)    takes okra pepsin supplement  . History of concussion    1987  fell of horse--- no residual  . Hypertension   . Migraine    occasional    Past Surgical History:  Procedure Laterality Date  . COLONOSCOPY  06/06/2016  . LAPAROSCOPIC CHOLECYSTECTOMY  06-03-2008   dr Ulyess Blossom  [facility]  . REDUCTION MAMMAPLASTY Bilateral 03-05-2002   dr barber  [facility]  . SHOULDER ARTHROSCOPY WITH ROTATOR CUFF REPAIR AND SUBACROMIAL DECOMPRESSION Right 04/17/2018   Procedure: Right shoulder evaluation under anesthesia, scope, debridement, subacromial decompression, rotator cuff repair;  Surgeon: Sydnee Cabal, MD;  Location: [facility];  Service: Orthopedics;  Laterality: Right;  120 mins General with Intra Scalene Block  . VAGINAL HYSTERECTOMY  02-12-2002   dr Nori Riis  [facility]  . WISDOM TOOTH EXTRACTION  teen    There were no vitals filed for this visit.  Subjective Assessment - 05/10/18 1612    Subjective   Reports she has been in [place] all day. Reports sling has been okay- just a bit uncomfortable when sleeping.     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 Maximum retraction is 16.5 mm and the tear is 14 mm wide. Mild tendinopathy involving the infraspinatus tendon probable small intrasubstance tears. The subscapularis tendon is intact.    Patient Stated Goals  keep strengthening and mobility of R shoulder    Currently in Pain?  No/denies                       [facility] Adult PT Treatment/Exercise - 05/10/18 0001      Shoulder Exercises: Seated   Retraction  Strengthening;Both;Limitations;15 reps    Retraction Limitations  2 sets 15x3"    Other Seated Exercises  cervical retraction + patient OP x10      Shoulder Exercises: Standing   Other Standing Exercises  Pendulum: CC/CCW/ant-pos/M/L 30sec      Vasopneumatic   Number Minutes Vasopneumatic   15 minutes    Vasopnuematic Location   Shoulder   R   Vasopneumatic Pressure  Low    Vasopneumatic Temperature   Coldest      Manual Therapy   Manual Therapy  Joint mobilization;Passive ROM  Manual therapy comments  Supine    Joint Mobilization  gentle grade II joint mobs to R shoulder in anterior, posterior, inferior directions to tolerance    Passive ROM  Slow R shoulder PROM in scapular plane with folded pillow under UE; avoiding ABD; prolonged holds at tolerable range   mildly decreased tolerance for ER today              PT Short Term Goals - 05/03/18 1652      PT SHORT TERM GOAL #1   Title  Patient to be independent with initial HEP.    Time  3    Period  Weeks    Status  Achieved        PT Long Term Goals - 04/26/18 1652      PT LONG TERM GOAL #1   Title  patient to be independent with advanced HEP    Time  6    Period  Weeks    Status  New   met for current   Target Date  06/07/18      PT LONG TERM GOAL #2   Title   patient to demonstrate full and non-painful AROM (including multi-planar motions) without pain    Time  9    Period  Weeks    Status  New   still presents with painful R shoulder AROM in all planes   Target Date  06/07/18      PT LONG TERM GOAL #3   Title  patient to improve R shoulder strength to >/= 4+/5 without pain    Time  6    Period  Weeks    Status  New   held until post-surgery   Target Date  06/07/18      PT LONG TERM GOAL #4   Title  Patient to demonstrate overhead reaching to place 5# object on shelf overhead with <=1/10 pain.    Time  6    Period  Weeks    Status  New   held until post-surgery   Target Date  06/07/18      PT LONG TERM GOAL #5   Title  Patient to report tolerance of 1 full shift at work without pain.    Time  6    Period  Weeks    Status  On-going   held until post-surgery   Target Date  06/07/18            Plan - 05/10/18 1641    Clinical Impression Statement  Patient arrived to session with no new complaints. Reported mild discomfort when sleeping with sling without abduction pillow on R UE. Adjusted patient's sling to a more neutral position as it had fallen since last session. Patient reported more comfort in this new position. Patient tolerated gentle PROM to R shoulder- slightly increased muscle guarding this session and decreased tolerance of ER, noting mild burning in posterior delt. Good tolerance of joint mobilizations this date. Patient with good carryover of cervical retractions this date- mild correction of form required. Patient requesting Gameready at end of session- normal integumentary response noted.     PT Treatment/Interventions  ADLs/Self Care Home Management;Cryotherapy;Electrical Stimulation;Moist Heat;Therapeutic exercise;Therapeutic activities;Neuromuscular re-education;Patient/family education;Manual techniques;Vasopneumatic Device;Taping;Dry needling;Passive range of motion;Ultrasound;Splinting;Energy conservation;Scar  mobilization    Consulted and Agree with Plan of Care  Patient       Patient will benefit from skilled therapeutic intervention in order to improve the following deficits and impairments:  Pain, Impaired UE functional use,  Decreased strength, Decreased range of motion, Decreased activity tolerance, Decreased scar mobility, Postural dysfunction, Impaired flexibility  Visit Diagnosis: Acute pain of right shoulder  Stiffness of right shoulder, not elsewhere classified  Other symptoms and signs involving the musculoskeletal system  Muscle weakness (generalized)     Problem List Patient Active Problem List   Diagnosis Date Noted  . S/P arthroscopy of right shoulder 04/17/2018    Janene Harvey, PT, DPT 05/10/18 4:56 PM   [facility] [address]  [place], [place], [address] Phone: [phone]   Fax:  [phone]  Name: Monica Petty MRN: [id number] Date of Birth: November 11, 1965

## 2018-05-14 MED FILL — PREMARIN 0.625 MG TABLET: 0.625 | 90 days supply | Qty: 90 | Fill #1

## 2018-05-14 MED FILL — ALPRAZolam 0.5 MG TABS: 0.5 | 30 days supply | Qty: 90 | Fill #2

## 2018-05-14 MED FILL — HYDROCHLOROTHIAZIDE 25 MG T: 25 | 90 days supply | Qty: 90 | Fill #1

## 2018-05-15 ENCOUNTER — Ambulatory Visit: Payer: PRIVATE HEALTH INSURANCE

## 2018-05-15 DIAGNOSIS — M25511 Pain in right shoulder: Secondary | ICD-10-CM | POA: Diagnosis not present

## 2018-05-15 DIAGNOSIS — M25611 Stiffness of right shoulder, not elsewhere classified: Secondary | ICD-10-CM

## 2018-05-15 DIAGNOSIS — R29898 Other symptoms and signs involving the musculoskeletal system: Secondary | ICD-10-CM

## 2018-05-15 DIAGNOSIS — M6281 Muscle weakness (generalized): Secondary | ICD-10-CM

## 2018-05-15 NOTE — Therapy (Signed)
[facility] [address]  [place], [place], [address] Phone: [phone]   Fax:  [phone]  Physical Therapy Treatment  Patient Details  Name: DORELLA LASTER MRN: [id number] Date of Birth: Apr 12, 1966 Referring Provider: Sydnee Cabal, MD   Encounter Date: 05/15/2018  PT End of Session - 05/15/18 1106    Visit Number  10    Number of Visits  18    Date for PT Re-Evaluation  06/07/18    Authorization Type  WC    Authorization Time Period  6 visits pre-op; 12 visits post-op after 08/16    Authorization - Visit Number  6    Authorization - Number of Visits  12    PT Start Time  1100    PT Stop Time  1155    PT Time Calculation (min)  55 min    Activity Tolerance  Patient limited by pain;Patient tolerated treatment well    Behavior During Therapy  [facility] for tasks assessed/performed       Past Medical History:  Diagnosis Date  . Anxiety   . Diverticulosis of colon   . GERD (gastroesophageal reflux disease)    takes okra pepsin supplement  . History of concussion    1987  fell of horse--- no residual  . Hypertension   . Migraine    occasional    Past Surgical History:  Procedure Laterality Date  . COLONOSCOPY  06/06/2016  . LAPAROSCOPIC CHOLECYSTECTOMY  06-03-2008   dr Ulyess Blossom  [facility]  . REDUCTION MAMMAPLASTY Bilateral 03-05-2002   dr barber  [facility]  . SHOULDER ARTHROSCOPY WITH ROTATOR CUFF REPAIR AND SUBACROMIAL DECOMPRESSION Right 04/17/2018   Procedure: Right shoulder evaluation under anesthesia, scope, debridement, subacromial decompression, rotator cuff repair;  Surgeon: Sydnee Cabal, MD;  Location: [facility];  Service: Orthopedics;  Laterality: Right;  120 mins General with Intra Scalene Block  . VAGINAL HYSTERECTOMY  02-12-2002   dr Nori Riis  [facility]  . WISDOM TOOTH EXTRACTION  teen    There were no vitals filed for this visit.  Subjective Assessment - 05/15/18 1105    Subjective  Pt.  noting dog ran into R arm on Saturday and had some soreness.  Feeling fine today.     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 Maximum retraction is 16.5 mm and the tear is 14 mm wide. Mild tendinopathy involving the infraspinatus tendon probable small intrasubstance tears. The subscapularis tendon is intact.    Patient Stated Goals  keep strengthening and mobility of R shoulder    Currently in Pain?  No/denies    Pain Score  0-No pain   Pt. noting R shoulder up to 7/10 on Saturday with dog running into shoulder    Multiple Pain Sites  No         [facility] PT Assessment - 05/15/18 1116      PROM   PROM Assessment Site  Shoulder    Right/Left Shoulder  Right    Right Shoulder Flexion  95 Degrees   difficulty "relaxing" musculature   Right Shoulder ABduction  83 Degrees    Right Shoulder Internal Rotation  60 Degrees    Right Shoulder External Rotation  25 Degrees                   [facility] Adult PT Treatment/Exercise - 05/15/18 1132      Shoulder Exercises: Supine  Other Supine Exercises  Chin tuck + retraction 5" x 10 reps       Shoulder Exercises: Seated   Retraction  15 reps;Strengthening;Both    Retraction Limitations  5" hold       Shoulder Exercises: Isometric Strengthening   Flexion  5X10"    Flexion Limitations  10% effort with towel roll under elbow   two finger pushing into doorseal   External Rotation  5X10";Limitations    External Rotation Limitations  10% effort with towel roll under elbow    Internal Rotation  5X10";Limitations    Internal Rotation Limitations  10% effort with dowel under elbow      Vasopneumatic   Number Minutes Vasopneumatic   15 minutes    Vasopnuematic Location   Shoulder   L   Vasopneumatic Pressure  Low    Vasopneumatic Temperature   Coldest      Manual Therapy   Manual Therapy  Passive ROM    Passive ROM  Slow R shoulder PROM in scapular plane with  folded pillow under UE; avoiding ABD; to tolerance                PT Short Term Goals - 05/15/18 1154      PT SHORT TERM GOAL #1   Title  Patient to be independent with initial HEP.    Time  3    Period  Weeks    Status  Achieved        PT Long Term Goals - 05/15/18 1111      PT LONG TERM GOAL #1   Title  patient to be independent with advanced HEP    Time  6    Period  Weeks    Status  On-going   met for current     PT LONG TERM GOAL #2   Title  patient to demonstrate full and non-painful AROM (including multi-planar motions) without pain    Time  9    Period  Weeks    Status  On-going   still presents with painful R shoulder AROM in all planes     PT LONG TERM GOAL #3   Title  patient to improve R shoulder strength to >/= 4+/5 without pain    Time  6    Period  Weeks    Status  On-going   held until post-surgery     PT LONG TERM GOAL #4   Title  Patient to demonstrate overhead reaching to place 5# object on shelf overhead with <=1/10 pain.    Time  6    Period  Weeks    Status  On-going   held until post-surgery     PT LONG TERM GOAL #5   Title  Patient to report tolerance of 1 full shift at work without pain.    Time  6    Period  Weeks    Status  On-going   held until post-surgery           Plan - 05/15/18 1107    Clinical Impression Statement  Iveth doing well today able to demonstrate progression of PROM today.  Did note some increased R shoulder soreness on Saturday after dog ran into R shoulder when pt. sitting on couch at home.  Feels R shoulder has recovered from this incident.  Tolerated all gentle isometric and scapular strengthening activities well today.  Progressing well toward goals.  Ended visit with ice/compression to R shoulder to reduce post-activity soreness and  swelling.      PT Treatment/Interventions  ADLs/Self Care Home Management;Cryotherapy;Electrical Stimulation;Moist Heat;Therapeutic exercise;Therapeutic  activities;Neuromuscular re-education;Patient/family education;Manual techniques;Vasopneumatic Device;Taping;Dry needling;Passive range of motion;Ultrasound;Splinting;Energy conservation;Scar mobilization    Consulted and Agree with Plan of Care  Patient       Patient will benefit from skilled therapeutic intervention in order to improve the following deficits and impairments:  Pain, Impaired UE functional use, Decreased strength, Decreased range of motion, Decreased activity tolerance, Decreased scar mobility, Postural dysfunction, Impaired flexibility  Visit Diagnosis: Acute pain of right shoulder  Stiffness of right shoulder, not elsewhere classified  Other symptoms and signs involving the musculoskeletal system  Muscle weakness (generalized)     Problem List Patient Active Problem List   Diagnosis Date Noted  . S/P arthroscopy of right shoulder 04/17/2018    Bess Harvest, PTA 05/15/18 12:00 PM   [facility] [address]  [place], [place], [address] Phone: [phone]   Fax:  [phone]  Name: TANICE PETRE MRN: [id number] Date of Birth: [birth year]

## 2018-05-17 ENCOUNTER — Ambulatory Visit: Payer: PRIVATE HEALTH INSURANCE | Admitting: Physical Therapy

## 2018-05-17 ENCOUNTER — Encounter: Payer: Self-pay | Admitting: Physical Therapy

## 2018-05-17 DIAGNOSIS — M25511 Pain in right shoulder: Secondary | ICD-10-CM | POA: Diagnosis not present

## 2018-05-17 DIAGNOSIS — M6281 Muscle weakness (generalized): Secondary | ICD-10-CM

## 2018-05-17 DIAGNOSIS — M25611 Stiffness of right shoulder, not elsewhere classified: Secondary | ICD-10-CM

## 2018-05-17 DIAGNOSIS — R29898 Other symptoms and signs involving the musculoskeletal system: Secondary | ICD-10-CM

## 2018-05-17 MED FILL — RESTASIS 0.05% EYE EMULSION: 0.05 | 30 days supply | Qty: 60 | Fill #0

## 2018-05-17 NOTE — Therapy (Signed)
[facility] [address]  [place], [place], [address] Phone: [phone]   Fax:  [phone]  Physical Therapy Treatment  Patient Details  Name: Monica Petty MRN: [id number] Date of Birth: Jun 27, 1966 Referring Provider: Sydnee Cabal, MD   Encounter Date: 05/17/2018  PT End of Session - 05/17/18 1624    Visit Number  11    Number of Visits  18    Date for PT Re-Evaluation  06/07/18    Authorization Type  WC    Authorization Time Period  6 visits pre-op; 12 visits post-op after 08/16    Authorization - Visit Number  7    Authorization - Number of Visits  24    PT Start Time  1529    PT Stop Time  1626    PT Time Calculation (min)  57 min    Activity Tolerance  Patient tolerated treatment well    Behavior During Therapy  [facility] for tasks assessed/performed       Past Medical History:  Diagnosis Date  . Anxiety   . Diverticulosis of colon   . GERD (gastroesophageal reflux disease)    takes okra pepsin supplement  . History of concussion    1987  fell of horse--- no residual  . Hypertension   . Migraine    occasional    Past Surgical History:  Procedure Laterality Date  . COLONOSCOPY  06/06/2016  . LAPAROSCOPIC CHOLECYSTECTOMY  06-03-2008   dr Ulyess Blossom  [facility]  . REDUCTION MAMMAPLASTY Bilateral 03-05-2002   dr barber  [facility]  . SHOULDER ARTHROSCOPY WITH ROTATOR CUFF REPAIR AND SUBACROMIAL DECOMPRESSION Right 04/17/2018   Procedure: Right shoulder evaluation under anesthesia, scope, debridement, subacromial decompression, rotator cuff repair;  Surgeon: Sydnee Cabal, MD;  Location: [facility];  Service: Orthopedics;  Laterality: Right;  120 mins General with Intra Scalene Block  . VAGINAL HYSTERECTOMY  02-12-2002   dr Nori Riis  [facility]  . WISDOM TOOTH EXTRACTION  teen    There were no vitals filed for this visit.  Subjective Assessment - 05/17/18 1530    Subjective  Patient saw MD- still in  sling and doesnt have to sleep in sling anymore. Can start driving and in 2 weeks can wean out of the sling.     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 Maximum retraction is 16.5 mm and the tear is 14 mm wide. Mild tendinopathy involving the infraspinatus tendon probable small intrasubstance tears. The subscapularis tendon is intact.    Patient Stated Goals  keep strengthening and mobility of R shoulder    Currently in Pain?  No/denies                       [facility] Adult PT Treatment/Exercise - 05/17/18 0001      Exercises   Exercises  Shoulder      Shoulder Exercises: Supine   External Rotation  AAROM;Both;Limitations;10 reps    External Rotation Limitations  wand; towel roll between elbow; R UE propped on folded pillow    Flexion  AAROM;Both;10 reps;Limitations    Flexion Limitations  to tolerance with pillow under R UE      Shoulder Exercises: Prone   Other Prone Exercises  R UE prone row x10   cues for scap squeeze     Shoulder Exercises: Pulleys   Flexion  3 minutes    Flexion  Limitations  to tolerance; advised not to push into pain    Scaption  3 minutes    Scaption Limitations  to tolerance; advised not to push into pain      Vasopneumatic   Number Minutes Vasopneumatic   10 minutes    Vasopnuematic Location   Shoulder   R   Vasopneumatic Pressure  Low    Vasopneumatic Temperature   Coldest      Manual Therapy   Manual Therapy  Passive ROM    Manual therapy comments  Supine    Joint Mobilization  gentle grade II joint mobs to R shoulder in anterior, posterior, inferior directions to tolerance    Soft tissue mobilization  gentle STM to R pec and infraspinatus- mildly tender in pec    Passive ROM  Slow R shoulder PROM into flexion, abduction, IR/ER to tolerance   reporting "catching" at midrange, improved at end range            PT Education - 05/17/18 1624    Education  provided  Yes    Education Details  update to [employer]) Educated  Patient    Methods  Explanation;Demonstration;Tactile cues;Verbal cues;Handout    Comprehension  Verbalized understanding;Returned demonstration       PT Short Term Goals - 05/15/18 1154      PT SHORT TERM GOAL #1   Title  Patient to be independent with initial HEP.    Time  3    Period  Weeks    Status  Achieved        PT Long Term Goals - 05/15/18 1111      PT LONG TERM GOAL #1   Title  patient to be independent with advanced HEP    Time  6    Period  Weeks    Status  On-going   met for current     PT LONG TERM GOAL #2   Title  patient to demonstrate full and non-painful AROM (including multi-planar motions) without pain    Time  9    Period  Weeks    Status  On-going   still presents with painful R shoulder AROM in all planes     PT LONG TERM GOAL #3   Title  patient to improve R shoulder strength to >/= 4+/5 without pain    Time  6    Period  Weeks    Status  On-going   held until post-surgery     PT LONG TERM GOAL #4   Title  Patient to demonstrate overhead reaching to place 5# object on shelf overhead with <=1/10 pain.    Time  6    Period  Weeks    Status  On-going   held until post-surgery     PT LONG TERM GOAL #5   Title  Patient to report tolerance of 1 full shift at work without pain.    Time  6    Period  Weeks    Status  On-going   held until post-surgery           Plan - 05/17/18 1627    Clinical Impression Statement  Patient arrived to session with report that she saw MD this week who advised her to start driving and wean off the sling in 2 weeks. Introduced flexion and scaption pulley within tolerable range- patient with some discomfort but improved after advising not to push into pain. Focused session on increasing R shoulder PROM-  patient noting "catch" of pain at midranges of motion which improved after moving past this point. Able to tolerate ~110-120 degrees of  flexion this date, and introduced ~90 degrees of abduction. Patient with good tolerance and ROM with AAROM flexion and prone row- added these exercises to HEP and administered handout. Received Gameready to R shoulder at end of session to ease muscle soreness. Report of relief at end of session.     PT Treatment/Interventions  ADLs/Self Care Home Management;Cryotherapy;Electrical Stimulation;Moist Heat;Therapeutic exercise;Therapeutic activities;Neuromuscular re-education;Patient/family education;Manual techniques;Vasopneumatic Device;Taping;Dry needling;Passive range of motion;Ultrasound;Splinting;Energy conservation;Scar mobilization    Consulted and Agree with Plan of Care  Patient       Patient will benefit from skilled therapeutic intervention in order to improve the following deficits and impairments:  Pain, Impaired UE functional use, Decreased strength, Decreased range of motion, Decreased activity tolerance, Decreased scar mobility, Postural dysfunction, Impaired flexibility  Visit Diagnosis: Acute pain of right shoulder  Stiffness of right shoulder, not elsewhere classified  Other symptoms and signs involving the musculoskeletal system  Muscle weakness (generalized)     Problem List Patient Active Problem List   Diagnosis Date Noted  . S/P arthroscopy of right shoulder 04/17/2018     Janene Harvey, PT, DPT 05/17/18 4:33 PM   [facility] [address]  [place], [place], [address] Phone: [phone]   Fax:  [phone]  Name: Monica Petty MRN: [id number] Date of Birth: 13-Nov-1965

## 2018-05-22 ENCOUNTER — Ambulatory Visit: Payer: PRIVATE HEALTH INSURANCE | Attending: Nurse Practitioner | Admitting: Physical Therapy

## 2018-05-22 DIAGNOSIS — R29898 Other symptoms and signs involving the musculoskeletal system: Secondary | ICD-10-CM | POA: Diagnosis present

## 2018-05-22 DIAGNOSIS — M25511 Pain in right shoulder: Secondary | ICD-10-CM | POA: Insufficient documentation

## 2018-05-22 DIAGNOSIS — M25611 Stiffness of right shoulder, not elsewhere classified: Secondary | ICD-10-CM | POA: Diagnosis present

## 2018-05-22 DIAGNOSIS — M6281 Muscle weakness (generalized): Secondary | ICD-10-CM | POA: Insufficient documentation

## 2018-05-22 NOTE — Therapy (Signed)
[facility] [address]  [place], [place], [address] Phone: [phone]   Fax:  [phone]  Physical Therapy Treatment  Patient Details  Name: Monica Petty MRN: [id number] Date of Birth: [birth year] Referring Provider: Sydnee Cabal, MD   Encounter Date: 05/22/2018  PT End of Session - 05/22/18 1214    Visit Number  12    Number of Visits  18    Date for PT Re-Evaluation  06/07/18    Authorization Type  WC    Authorization Time Period  6 visits pre-op; 12 visits post-op after 08/16    Authorization - Visit Number  8    Authorization - Number of Visits  24    PT Start Time  6606    PT Stop Time  0937    PT Time Calculation (min)  53 min    Activity Tolerance  Patient tolerated treatment well;Patient limited by pain    Behavior During Therapy  [facility] for tasks assessed/performed       Past Medical History:  Diagnosis Date  . Anxiety   . Diverticulosis of colon   . GERD (gastroesophageal reflux disease)    takes okra pepsin supplement  . History of concussion    1987  fell of horse--- no residual  . Hypertension   . Migraine    occasional    Past Surgical History:  Procedure Laterality Date  . COLONOSCOPY  06/06/2016  . LAPAROSCOPIC CHOLECYSTECTOMY  06-03-2008   dr Ulyess Blossom  [facility]  . REDUCTION MAMMAPLASTY Bilateral 03-05-2002   dr barber  [facility]  . SHOULDER ARTHROSCOPY WITH ROTATOR CUFF REPAIR AND SUBACROMIAL DECOMPRESSION Right 04/17/2018   Procedure: Right shoulder evaluation under anesthesia, scope, debridement, subacromial decompression, rotator cuff repair;  Surgeon: Sydnee Cabal, MD;  Location: [facility];  Service: Orthopedics;  Laterality: Right;  120 mins General with Intra Scalene Block  . VAGINAL HYSTERECTOMY  02-12-2002   dr Nori Riis  [facility]  . WISDOM TOOTH EXTRACTION  teen    There were no vitals filed for this visit.  Subjective Assessment - 05/22/18 0845    Subjective   Reports she felt a little sore after last appointment. Felt achey but nothing she couldn't handle.     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 Maximum retraction is 16.5 mm and the tear is 14 mm wide. Mild tendinopathy involving the infraspinatus tendon probable small intrasubstance tears. The subscapularis tendon is intact.    Patient Stated Goals  keep strengthening and mobility of R shoulder    Currently in Pain?  No/denies                       [facility] Adult PT Treatment/Exercise - 05/22/18 0001      Exercises   Exercises  Shoulder      Shoulder Exercises: Supine   Protraction  AAROM;Both;10 reps    Protraction Limitations  wand; cues for scap retraction at the bottom    External Rotation  AAROM;Both;Limitations;10 reps    External Rotation Limitations  wand; towel roll between elbow; R UE propped on folded pillow    Flexion  AAROM;Both;10 reps;Limitations    Flexion Limitations  to tolerance with pillow under R UE    ABduction  AAROM;Both;10 reps;Limitations   catching pain in R anterior shoulder, which resolved   ABduction Limitations  scaption with wand to tolerance  Shoulder Exercises: Prone   Other Prone Exercises  R UE prone row x10    Other Prone Exercises  standing leaning over counter top R UE prone extension x10   cues for scap squeeze     Shoulder Exercises: Pulleys   Flexion  3 minutes   better tolerance today   Flexion Limitations  to tolerance; advised not to push into pain    Scaption  3 minutes    Scaption Limitations  to tolerance; advised not to push into pain      Vasopneumatic   Vasopnuematic Location   Shoulder   R   Vasopneumatic Pressure  Low    Vasopneumatic Temperature   Coldest      Manual Therapy   Manual Therapy  Passive ROM    Manual therapy comments  Supine    Passive ROM  Slow R shoulder PROM into flexion, abduction, IR/ER to tolerance with prolonged  holds at end range             PT Education - 05/22/18 1214    Education provided  Yes    Education Details  update to HEP, to be performed with wand and in supine    Person(s) Educated  Patient    Methods  Explanation;Demonstration;Tactile cues;Verbal cues;Handout    Comprehension  Returned demonstration;Verbalized understanding       PT Short Term Goals - 05/15/18 1154      PT SHORT TERM GOAL #1   Title  Patient to be independent with initial HEP.    Time  3    Period  Weeks    Status  Achieved        PT Long Term Goals - 05/15/18 1111      PT LONG TERM GOAL #1   Title  patient to be independent with advanced HEP    Time  6    Period  Weeks    Status  On-going   met for current     PT LONG TERM GOAL #2   Title  patient to demonstrate full and non-painful AROM (including multi-planar motions) without pain    Time  9    Period  Weeks    Status  On-going   still presents with painful R shoulder AROM in all planes     PT LONG TERM GOAL #3   Title  patient to improve R shoulder strength to >/= 4+/5 without pain    Time  6    Period  Weeks    Status  On-going   held until post-surgery     PT LONG TERM GOAL #4   Title  Patient to demonstrate overhead reaching to place 5# object on shelf overhead with <=1/10 pain.    Time  6    Period  Weeks    Status  On-going   held until post-surgery     PT LONG TERM GOAL #5   Title  Patient to report tolerance of 1 full shift at work without pain.    Time  6    Period  Weeks    Status  On-going   held until post-surgery           Plan - 05/22/18 1215    Clinical Impression Statement  Patient arrived to session with report of mild soreness in R shoulder after last session. Better tolerance of pulley for warm up this session- patient noting "catch of pain" at ~90 degrees flexion but with dissipation after moving past this  point. Good tolerance of R shoulder PROM this date with focus on attaining improved  abduction ROM. Introduced AAROM scaption to tolerance with good carryover by patient. Added this exercise to HEP to be performed in supine. Patient reported understanding. Patient noting discomfort from prone positioning with prone row, introduced prone extension leaning over counter top to simulate prone positioning. Patient with better tolerance of this exercise. Ended session with report of muscle fatigue which was decreased after receiving Gameready to R shoulder.     PT Treatment/Interventions  ADLs/Self Care Home Management;Cryotherapy;Electrical Stimulation;Moist Heat;Therapeutic exercise;Therapeutic activities;Neuromuscular re-education;Patient/family education;Manual techniques;Vasopneumatic Device;Taping;Dry needling;Passive range of motion;Ultrasound;Splinting;Energy conservation;Scar mobilization    Consulted and Agree with Plan of Care  Patient       Patient will benefit from skilled therapeutic intervention in order to improve the following deficits and impairments:  Pain, Impaired UE functional use, Decreased strength, Decreased range of motion, Decreased activity tolerance, Decreased scar mobility, Postural dysfunction, Impaired flexibility  Visit Diagnosis: Acute pain of right shoulder  Stiffness of right shoulder, not elsewhere classified  Other symptoms and signs involving the musculoskeletal system  Muscle weakness (generalized)     Problem List Patient Active Problem List   Diagnosis Date Noted  . S/P arthroscopy of right shoulder 04/17/2018     Janene Harvey, PT, DPT 05/22/18 12:22 PM   [facility] [address]  [place], [place], [address] Phone: [phone]   Fax:  [phone]  Name: Monica Petty MRN: [id number] Date of Birth: Jan 18, 1966

## 2018-05-23 DIAGNOSIS — R87610 Atypical squamous cells of undetermined significance on cytologic smear of cervix (ASC-US): Secondary | ICD-10-CM | POA: Diagnosis not present

## 2018-05-23 MED FILL — METHOCARBAMOL 500 MG TABLET: 500 | 16 days supply | Qty: 50 | Fill #2

## 2018-05-24 ENCOUNTER — Ambulatory Visit: Payer: PRIVATE HEALTH INSURANCE | Admitting: Physical Therapy

## 2018-05-24 ENCOUNTER — Encounter: Payer: Self-pay | Admitting: Physical Therapy

## 2018-05-24 DIAGNOSIS — M25511 Pain in right shoulder: Secondary | ICD-10-CM

## 2018-05-24 DIAGNOSIS — M6281 Muscle weakness (generalized): Secondary | ICD-10-CM

## 2018-05-24 DIAGNOSIS — M25611 Stiffness of right shoulder, not elsewhere classified: Secondary | ICD-10-CM

## 2018-05-24 DIAGNOSIS — R29898 Other symptoms and signs involving the musculoskeletal system: Secondary | ICD-10-CM

## 2018-05-24 NOTE — Therapy (Signed)
[facility] [address]  [place], [place], [address] Phone: [phone]   Fax:  [phone]  Physical Therapy Treatment  Patient Details  Name: Monica Petty MRN: [id number] Date of Birth: [birth year] Referring Provider: Sydnee Cabal, MD   Encounter Date: 05/24/2018  PT End of Session - 05/24/18 1028    Visit Number  13    Number of Visits  18    Date for PT Re-Evaluation  06/07/18    Authorization Type  WC    Authorization Time Period  6 visits pre-op; 12 visits post-op after 08/16    Authorization - Visit Number  9    Authorization - Number of Visits  24    PT Start Time  0931    PT Stop Time  1030    PT Time Calculation (min)  59 min    Activity Tolerance  Patient tolerated treatment well;Patient limited by pain    Behavior During Therapy  [facility] for tasks assessed/performed       Past Medical History:  Diagnosis Date  . Anxiety   . Diverticulosis of colon   . GERD (gastroesophageal reflux disease)    takes okra pepsin supplement  . History of concussion    1987  fell of horse--- no residual  . Hypertension   . Migraine    occasional    Past Surgical History:  Procedure Laterality Date  . COLONOSCOPY  06/06/2016  . LAPAROSCOPIC CHOLECYSTECTOMY  06-03-2008   dr Ulyess Blossom  [facility]  . REDUCTION MAMMAPLASTY Bilateral 03-05-2002   dr barber  [facility]  . SHOULDER ARTHROSCOPY WITH ROTATOR CUFF REPAIR AND SUBACROMIAL DECOMPRESSION Right 04/17/2018   Procedure: Right shoulder evaluation under anesthesia, scope, debridement, subacromial decompression, rotator cuff repair;  Surgeon: Sydnee Cabal, MD;  Location: [facility];  Service: Orthopedics;  Laterality: Right;  120 mins General with Intra Scalene Block  . VAGINAL HYSTERECTOMY  02-12-2002   dr Nori Riis  [facility]  . WISDOM TOOTH EXTRACTION  teen    There were no vitals filed for this visit.  Subjective Assessment - 05/24/18 0932    Subjective   Reports R shoulder feels more tired today. Thinks she may have over done it last session. Feels a painful pop with one of the AAROM exercises.    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 Maximum retraction is 16.5 mm and the tear is 14 mm wide. Mild tendinopathy involving the infraspinatus tendon probable small intrasubstance tears. The subscapularis tendon is intact.    Patient Stated Goals  keep strengthening and mobility of R shoulder    Currently in Pain?  Yes    Pain Score  3     Pain Location  Shoulder    Pain Orientation  Right    Pain Descriptors / Indicators  Throbbing    Pain Type  Acute pain;Surgical pain                       [facility] Adult PT Treatment/Exercise - 05/24/18 0001      Exercises   Exercises  Shoulder      Elbow Exercises   Elbow Flexion Limitations  R bicep curl with 1# x10   report of burning with 2#     Shoulder Exercises: Supine   External Rotation  AAROM;Both;Limitations;10 reps    External Rotation Limitations  wand; dowel roll between elbow; R UE  propped on 2 pillows    Flexion  AAROM;Both;10 reps;Limitations    Flexion Limitations  to tolerance with 2 pillows under R UE    ABduction  AAROM;Both;10 reps;Limitations    ABduction Limitations  scaption with wand to tolerance with 2 pillows under R arm      Shoulder Exercises: Seated   Flexion  AAROM;Both;10 reps;Limitations    Flexion Weight (lbs)  10x3" flexion with pball roll to tolerance      Shoulder Exercises: Pulleys   Flexion  3 minutes    Flexion Limitations  to tolerance; advised not to push into pain    Scaption  3 minutes   corrected form   Scaption Limitations  to tolerance; advised not to push into pain      Vasopneumatic   Number Minutes Vasopneumatic   15 minutes    Vasopnuematic Location   Shoulder   R   Vasopneumatic Pressure  Low    Vasopneumatic Temperature   Coldest      Manual Therapy    Manual Therapy  Soft tissue mobilization    Soft tissue mobilization  R bicep muscle belly and anterior delt- most soft tissue restriction and tenderness in bicep; mildly edematous in anterior shoulder but non-tender    Passive ROM  Slow R shoulder PROM into flexion, abduction, IR/ER to tolerance with prolonged holds at end range   mild pain in infraspinatus region during ER and scaption/ABD            PT Education - 05/24/18 1027    Education provided  Yes    Education Details  update to HEP; advised to add plastic water bottle weight to elbow flexion AROM    Person(s) Educated  Patient    Methods  Explanation;Demonstration;Tactile cues;Handout;Verbal cues    Comprehension  Verbalized understanding;Returned demonstration       PT Short Term Goals - 05/15/18 1154      PT SHORT TERM GOAL #1   Title  Patient to be independent with initial HEP.    Time  3    Period  Weeks    Status  Achieved        PT Long Term Goals - 05/15/18 1111      PT LONG TERM GOAL #1   Title  patient to be independent with advanced HEP    Time  6    Period  Weeks    Status  On-going   met for current     PT LONG TERM GOAL #2   Title  patient to demonstrate full and non-painful AROM (including multi-planar motions) without pain    Time  9    Period  Weeks    Status  On-going   still presents with painful R shoulder AROM in all planes     PT LONG TERM GOAL #3   Title  patient to improve R shoulder strength to >/= 4+/5 without pain    Time  6    Period  Weeks    Status  On-going   held until post-surgery     PT LONG TERM GOAL #4   Title  Patient to demonstrate overhead reaching to place 5# object on shelf overhead with <=1/10 pain.    Time  6    Period  Weeks    Status  On-going   held until post-surgery     PT LONG TERM GOAL #5   Title  Patient to report tolerance of 1 full shift at work without  pain.    Time  6    Period  Weeks    Status  On-going   held until post-surgery            Plan - 05/24/18 1028    Clinical Impression Statement  Patient arrived to session with report of increased pain in R shoulder after last session, however noting that she "felt good during last session." Reports feeling pop in R infraspinatus region while performing AAROM scaption/ABD at home. Further questioning revealed patient was performing this exercise without pillow support, however had no popping or pain with this exercise with proper set up today in clinic. Educated patient on proper positioning. Patient reported understanding. R anterior shoulder slightly edematous today. Reminded patient to keep icing at home. Tolerated gentle R shoulder PROM in all planes with mild pain in infraspinatus region with ER and ABD/scaption. Soft tissue restriction and tenderness palpated in R biceps today which improved after STM. Progressed bicep curls with 1 lb weight today- unable to tolerate 2# d/t burning sensation. Introduced shoulder flexion AAROM with pball with good tolerance; administered HEP handout for this exercise and answered all questions. Ended session with Gameready to R shoulder for edema and pain relief. Relief reported at end of session.     PT Treatment/Interventions  ADLs/Self Care Home Management;Cryotherapy;Electrical Stimulation;Moist Heat;Therapeutic exercise;Therapeutic activities;Neuromuscular re-education;Patient/family education;Manual techniques;Vasopneumatic Device;Taping;Dry needling;Passive range of motion;Ultrasound;Splinting;Energy conservation;Scar mobilization    Consulted and Agree with Plan of Care  Patient       Patient will benefit from skilled therapeutic intervention in order to improve the following deficits and impairments:  Pain, Impaired UE functional use, Decreased strength, Decreased range of motion, Decreased activity tolerance, Decreased scar mobility, Postural dysfunction, Impaired flexibility  Visit Diagnosis: Acute pain of right  shoulder  Stiffness of right shoulder, not elsewhere classified  Other symptoms and signs involving the musculoskeletal system  Muscle weakness (generalized)     Problem List Patient Active Problem List   Diagnosis Date Noted  . S/P arthroscopy of right shoulder 04/17/2018     Janene Harvey, PT, DPT 05/24/18 10:46 AM   [facility] [address]  [place], [place], [address] Phone: [phone]   Fax:  [phone]  Name: Monica Petty MRN: [id number] Date of Birth: November 02, 1965

## 2018-05-29 ENCOUNTER — Ambulatory Visit: Payer: PRIVATE HEALTH INSURANCE

## 2018-05-29 DIAGNOSIS — M25511 Pain in right shoulder: Secondary | ICD-10-CM

## 2018-05-29 DIAGNOSIS — R29898 Other symptoms and signs involving the musculoskeletal system: Secondary | ICD-10-CM

## 2018-05-29 DIAGNOSIS — M25611 Stiffness of right shoulder, not elsewhere classified: Secondary | ICD-10-CM

## 2018-05-29 DIAGNOSIS — M6281 Muscle weakness (generalized): Secondary | ICD-10-CM

## 2018-05-29 NOTE — Therapy (Signed)
[facility] [address]  [place], [place], [address] Phone: [phone]   Fax:  [phone]  Physical Therapy Treatment  Patient Details  Name: Monica Petty MRN: [id number] Date of Birth: [birth year] Referring Provider: Sydnee Cabal, MD   Encounter Date: 05/29/2018  PT End of Session - 05/29/18 1621    Visit Number  14    Number of Visits  18    Date for PT Re-Evaluation  06/07/18    Authorization Type  WC    Authorization Time Period  6 visits pre-op; 12 visits post-op after 08/16    Authorization - Visit Number  10    Authorization - Number of Visits  24    PT Start Time  0071    PT Stop Time  1716    PT Time Calculation (min)  61 min    Activity Tolerance  Patient tolerated treatment well;Patient limited by pain    Behavior During Therapy  [facility] for tasks assessed/performed       Past Medical History:  Diagnosis Date  . Anxiety   . Diverticulosis of colon   . GERD (gastroesophageal reflux disease)    takes okra pepsin supplement  . History of concussion    1987  fell of horse--- no residual  . Hypertension   . Migraine    occasional    Past Surgical History:  Procedure Laterality Date  . COLONOSCOPY  06/06/2016  . LAPAROSCOPIC CHOLECYSTECTOMY  06-03-2008   dr Ulyess Blossom  [facility]  . REDUCTION MAMMAPLASTY Bilateral 03-05-2002   dr barber  [facility]  . SHOULDER ARTHROSCOPY WITH ROTATOR CUFF REPAIR AND SUBACROMIAL DECOMPRESSION Right 04/17/2018   Procedure: Right shoulder evaluation under anesthesia, scope, debridement, subacromial decompression, rotator cuff repair;  Surgeon: Sydnee Cabal, MD;  Location: [facility];  Service: Orthopedics;  Laterality: Right;  120 mins General with Intra Scalene Block  . VAGINAL HYSTERECTOMY  02-12-2002   dr Nori Riis  [facility]  . WISDOM TOOTH EXTRACTION  teen    There were no vitals filed for this visit.  Subjective Assessment - 05/29/18 1622    Subjective  Pt.  doing well today.  Still notes some difficulty with AAROM wand abduction motion.      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 Maximum retraction is 16.5 mm and the tear is 14 mm wide. Mild tendinopathy involving the infraspinatus tendon probable small intrasubstance tears. The subscapularis tendon is intact.    Patient Stated Goals  keep strengthening and mobility of R shoulder    Currently in Pain?  Yes    Pain Score  3     Pain Location  Shoulder    Pain Orientation  Right    Pain Descriptors / Indicators  Aching    Pain Type  Acute pain;Surgical pain    Aggravating Factors   moving it     Pain Relieving Factors  ibuprofen     Multiple Pain Sites  No         [facility] PT Assessment - 05/29/18 1640      PROM   PROM Assessment Site  Shoulder    Right/Left Shoulder  Right    Right Shoulder Flexion  137 Degrees    Right Shoulder ABduction  98 Degrees    Right Shoulder Internal Rotation  85 Degrees    Right Shoulder External Rotation  43 Degrees  [facility] Adult PT Treatment/Exercise - 05/29/18 1638      Shoulder Exercises: Supine   Protraction  Right;10 reps    Protraction Limitations  L UE assistance    Flexion  AAROM;Right;15 reps    Flexion Limitations  with L UE assistance     ABduction  AAROM;Both;Limitations;15 reps    ABduction Limitations  scaption with wand to tolerance with bolster under elbow to maintain scapular plane       Shoulder Exercises: Seated   Abduction  10 reps;Right;AAROM    ABduction Limitations  scaption with orange p-ball roll on table and pt. seated in chair     Other Seated Exercises  seated R scaption table slide with R UE on towel on counter x 10 pres       Shoulder Exercises: Pulleys   Flexion  3 minutes    Flexion Limitations  to tolerance; advised not to push into pain    Scaption  3 minutes    Scaption Limitations  to tolerance; advised not to  push into pain      Vasopneumatic   Number Minutes Vasopneumatic   15 minutes    Vasopnuematic Location   Shoulder    Vasopneumatic Pressure  Low    Vasopneumatic Temperature   Coldest      Manual Therapy   Manual Therapy  Passive ROM    Passive ROM  Slow R shoulder PROM into flexion, abduction, IR/ER to tolerance with prolonged holds at end range               PT Short Term Goals - 05/15/18 1154      PT SHORT TERM GOAL #1   Title  Patient to be independent with initial HEP.    Time  3    Period  Weeks    Status  Achieved        PT Long Term Goals - 05/15/18 1111      PT LONG TERM GOAL #1   Title  patient to be independent with advanced HEP    Time  6    Period  Weeks    Status  On-going   met for current     PT LONG TERM GOAL #2   Title  patient to demonstrate full and non-painful AROM (including multi-planar motions) without pain    Time  9    Period  Weeks    Status  On-going   still presents with painful R shoulder AROM in all planes     PT LONG TERM GOAL #3   Title  patient to improve R shoulder strength to >/= 4+/5 without pain    Time  6    Period  Weeks    Status  On-going   held until post-surgery     PT LONG TERM GOAL #4   Title  Patient to demonstrate overhead reaching to place 5# object on shelf overhead with <=1/10 pain.    Time  6    Period  Weeks    Status  On-going   held until post-surgery     PT LONG TERM GOAL #5   Title  Patient to report tolerance of 1 full shift at work without pain.    Time  6    Period  Weeks    Status  On-going   held until post-surgery           Plan - 05/29/18 1630    Clinical Impression Statement  Monica Petty reporting limited tolerance for  abduction/scaption AAROM with wand activity at home thus adjusted this activity for improved comfort.  Tolerated progression of AAROM activities today well.  Able to demo much improved PROM in all motions today still most limited in ER motion.  Ended visit with  ice/compression to R shoulder to decrease post-exercise swelling and pain.      PT Treatment/Interventions  ADLs/Self Care Home Management;Cryotherapy;Electrical Stimulation;Moist Heat;Therapeutic exercise;Therapeutic activities;Neuromuscular re-education;Patient/family education;Manual techniques;Vasopneumatic Device;Taping;Dry needling;Passive range of motion;Ultrasound;Splinting;Energy conservation;Scar mobilization    Consulted and Agree with Plan of Care  Patient       Patient will benefit from skilled therapeutic intervention in order to improve the following deficits and impairments:  Pain, Impaired UE functional use, Decreased strength, Decreased range of motion, Decreased activity tolerance, Decreased scar mobility, Postural dysfunction, Impaired flexibility  Visit Diagnosis: Acute pain of right shoulder  Stiffness of right shoulder, not elsewhere classified  Other symptoms and signs involving the musculoskeletal system  Muscle weakness (generalized)     Problem List Patient Active Problem List   Diagnosis Date Noted  . S/P arthroscopy of right shoulder 04/17/2018    Bess Harvest, PTA 05/29/18 6:04 PM   [facility] [address]  [place], [place], [address] Phone: [phone]   Fax:  [phone]  Name: Monica Petty MRN: [id number] Date of Birth: 1966/05/07

## 2018-05-31 ENCOUNTER — Ambulatory Visit: Payer: PRIVATE HEALTH INSURANCE | Admitting: Physical Therapy

## 2018-05-31 ENCOUNTER — Encounter: Payer: Self-pay | Admitting: Physical Therapy

## 2018-05-31 DIAGNOSIS — M25511 Pain in right shoulder: Secondary | ICD-10-CM

## 2018-05-31 DIAGNOSIS — M6281 Muscle weakness (generalized): Secondary | ICD-10-CM

## 2018-05-31 DIAGNOSIS — R29898 Other symptoms and signs involving the musculoskeletal system: Secondary | ICD-10-CM

## 2018-05-31 DIAGNOSIS — M25611 Stiffness of right shoulder, not elsewhere classified: Secondary | ICD-10-CM

## 2018-05-31 NOTE — Therapy (Signed)
[facility] [address]  [place], [place], [address] Phone: [phone]   Fax:  [phone]  Physical Therapy Treatment  Patient Details  Name: JASKIRAT ZERTUCHE MRN: [id number] Date of Birth: 1966/03/28 Referring Provider: Sydnee Cabal, MD   Encounter Date: 05/31/2018  PT End of Session - 05/31/18 1613    Visit Number  15    Number of Visits  18    Date for PT Re-Evaluation  06/07/18    Authorization Type  WC    Authorization Time Period  6 visits pre-op; 12 visits post-op after 08/16    Authorization - Visit Number  11    Authorization - Number of Visits  24    PT Start Time  6568    PT Stop Time  1626    PT Time Calculation (min)  53 min    Activity Tolerance  Patient tolerated treatment well;Patient limited by pain    Behavior During Therapy  [facility] for tasks assessed/performed       Past Medical History:  Diagnosis Date  . Anxiety   . Diverticulosis of colon   . GERD (gastroesophageal reflux disease)    takes okra pepsin supplement  . History of concussion    1987  fell of horse--- no residual  . Hypertension   . Migraine    occasional    Past Surgical History:  Procedure Laterality Date  . COLONOSCOPY  06/06/2016  . LAPAROSCOPIC CHOLECYSTECTOMY  06-03-2008   dr Ulyess Blossom  [facility]  . REDUCTION MAMMAPLASTY Bilateral 03-05-2002   dr barber  [facility]  . SHOULDER ARTHROSCOPY WITH ROTATOR CUFF REPAIR AND SUBACROMIAL DECOMPRESSION Right 04/17/2018   Procedure: Right shoulder evaluation under anesthesia, scope, debridement, subacromial decompression, rotator cuff repair;  Surgeon: Sydnee Cabal, MD;  Location: [facility];  Service: Orthopedics;  Laterality: Right;  120 mins General with Intra Scalene Block  . VAGINAL HYSTERECTOMY  02-12-2002   dr Nori Riis  [facility]  . WISDOM TOOTH EXTRACTION  teen    There were no vitals filed for this visit.  Subjective Assessment - 05/31/18 1534    Subjective   Reports she is now out of the sling since she reached the 6 week mark. R shoulder has been sore since this change.     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 Maximum retraction is 16.5 mm and the tear is 14 mm wide. Mild tendinopathy involving the infraspinatus tendon probable small intrasubstance tears. The subscapularis tendon is intact.    Patient Stated Goals  keep strengthening and mobility of R shoulder    Currently in Pain?  Yes    Pain Score  2     Pain Location  Shoulder    Pain Orientation  Right    Pain Descriptors / Indicators  Aching    Pain Type  Acute pain;Surgical pain                       [facility] Adult PT Treatment/Exercise - 05/31/18 0001      Exercises   Exercises  Shoulder      Shoulder Exercises: Supine   Protraction  Right;10 reps;AAROM    Protraction Limitations  wand    Flexion  Right;10 reps;AROM    Flexion Limitations  with manual assistance during eccentric phase    ABduction  AAROM;Both;Limitations;10 reps    ABduction Limitations  scaption with wand  to tolerance with 2 pillows under R UE to maintain scaption      Shoulder Exercises: Prone   Other Prone Exercises  R UE prone row leaning over counter top x10    Other Prone Exercises  standing leaning over counter top R UE prone extension x10      Shoulder Exercises: Sidelying   External Rotation  Strengthening;Right;10 reps;Limitations    External Rotation Limitations  R shoulder in neutral with dowel; to tolerance; 2x10      Shoulder Exercises: Pulleys   Flexion  3 minutes    Flexion Limitations  to tolerance    Scaption  3 minutes    Scaption Limitations  to tolerance      Vasopneumatic   Number Minutes Vasopneumatic   15 minutes    Vasopnuematic Location   Shoulder   R   Vasopneumatic Pressure  Low    Vasopneumatic Temperature   Coldest      Manual Therapy   Manual Therapy  Passive ROM    Passive ROM   Slow R shoulder PROM into flexion, abduction, IR/ER to tolerance with prolonged holds at end range   most limited in abduction            PT Education - 05/31/18 1613    Education provided  Yes    Education Details  update to [employer]) Educated  Patient    Methods  Explanation;Demonstration;Tactile cues;Verbal cues;Handout    Comprehension  Returned demonstration;Verbalized understanding       PT Short Term Goals - 05/15/18 1154      PT SHORT TERM GOAL #1   Title  Patient to be independent with initial HEP.    Time  3    Period  Weeks    Status  Achieved        PT Long Term Goals - 05/15/18 1111      PT LONG TERM GOAL #1   Title  patient to be independent with advanced HEP    Time  6    Period  Weeks    Status  On-going   met for current     PT LONG TERM GOAL #2   Title  patient to demonstrate full and non-painful AROM (including multi-planar motions) without pain    Time  9    Period  Weeks    Status  On-going   still presents with painful R shoulder AROM in all planes     PT LONG TERM GOAL #3   Title  patient to improve R shoulder strength to >/= 4+/5 without pain    Time  6    Period  Weeks    Status  On-going   held until post-surgery     PT LONG TERM GOAL #4   Title  Patient to demonstrate overhead reaching to place 5# object on shelf overhead with <=1/10 pain.    Time  6    Period  Weeks    Status  On-going   held until post-surgery     PT LONG TERM GOAL #5   Title  Patient to report tolerance of 1 full shift at work without pain.    Time  6    Period  Weeks    Status  On-going   held until post-surgery           Plan - 05/31/18 1614    Clinical Impression Statement  Patient discontinued sling this week per MD's orders. Reporting increased soreness  in R shoulder since. Tolerated gentle R shoulder PROM in all planes, most limited and painful in abduction at this time. Introduced supine R shoulder flexion with manual assistance  during eccentric phase. Also introduced sidelying ER with cues to maintain neutral positioning. Good tolerance of these activities, only with mild muscle fatigue by last reps. Added these exercises to HEP and administered handout. Received Gameready to R shoulder at end of session to ease muscle soreness. Patient progressing well per protocol at this time.     PT Treatment/Interventions  ADLs/Self Care Home Management;Cryotherapy;Electrical Stimulation;Moist Heat;Therapeutic exercise;Therapeutic activities;Neuromuscular re-education;Patient/family education;Manual techniques;Vasopneumatic Device;Taping;Dry needling;Passive range of motion;Ultrasound;Splinting;Energy conservation;Scar mobilization    Consulted and Agree with Plan of Care  Patient       Patient will benefit from skilled therapeutic intervention in order to improve the following deficits and impairments:  Pain, Impaired UE functional use, Decreased strength, Decreased range of motion, Decreased activity tolerance, Decreased scar mobility, Postural dysfunction, Impaired flexibility  Visit Diagnosis: Acute pain of right shoulder  Stiffness of right shoulder, not elsewhere classified  Other symptoms and signs involving the musculoskeletal system  Muscle weakness (generalized)     Problem List Patient Active Problem List   Diagnosis Date Noted  . S/P arthroscopy of right shoulder 04/17/2018    Janene Harvey, PT, DPT 05/31/18 4:38 PM   [facility] [address]  [place], [place], [address] Phone: [phone]   Fax:  [phone]  Name: MACY POLIO MRN: [id number] Date of Birth: 03/11/1966

## 2018-06-04 ENCOUNTER — Ambulatory Visit: Payer: PRIVATE HEALTH INSURANCE

## 2018-06-04 DIAGNOSIS — M25611 Stiffness of right shoulder, not elsewhere classified: Secondary | ICD-10-CM

## 2018-06-04 DIAGNOSIS — M25511 Pain in right shoulder: Secondary | ICD-10-CM

## 2018-06-04 DIAGNOSIS — M6281 Muscle weakness (generalized): Secondary | ICD-10-CM

## 2018-06-04 DIAGNOSIS — R29898 Other symptoms and signs involving the musculoskeletal system: Secondary | ICD-10-CM

## 2018-06-04 NOTE — Therapy (Signed)
[facility] [address]  [place], [place], [address] Phone: [phone]   Fax:  [phone]  Physical Therapy Treatment  Patient Details  Name: Monica Petty MRN: [id number] Date of Birth: 09-09-1966 Referring Provider: Sydnee Cabal, MD   Encounter Date: 06/04/2018  PT End of Session - 06/04/18 1621    Visit Number  16    Number of Visits  18    Date for PT Re-Evaluation  06/07/18    Authorization Type  WC    Authorization Time Period  6 visits pre-op; 12 visits post-op after 08/16    Authorization - Visit Number  12    Authorization - Number of Visits  24    PT Start Time  1618    PT Stop Time  0375    PT Time Calculation (min)  56 min    Activity Tolerance  Patient tolerated treatment well;Patient limited by pain    Behavior During Therapy  [facility] for tasks assessed/performed       Past Medical History:  Diagnosis Date  . Anxiety   . Diverticulosis of colon   . GERD (gastroesophageal reflux disease)    takes okra pepsin supplement  . History of concussion    1987  fell of horse--- no residual  . Hypertension   . Migraine    occasional    Past Surgical History:  Procedure Laterality Date  . COLONOSCOPY  06/06/2016  . LAPAROSCOPIC CHOLECYSTECTOMY  06-03-2008   dr Ulyess Blossom  [facility]  . REDUCTION MAMMAPLASTY Bilateral 03-05-2002   dr barber  [facility]  . SHOULDER ARTHROSCOPY WITH ROTATOR CUFF REPAIR AND SUBACROMIAL DECOMPRESSION Right 04/17/2018   Procedure: Right shoulder evaluation under anesthesia, scope, debridement, subacromial decompression, rotator cuff repair;  Surgeon: Sydnee Cabal, MD;  Location: [facility];  Service: Orthopedics;  Laterality: Right;  120 mins General with Intra Scalene Block  . VAGINAL HYSTERECTOMY  02-12-2002   dr Nori Riis  [facility]  . WISDOM TOOTH EXTRACTION  teen    There were no vitals filed for this visit.  Subjective Assessment - 06/04/18 1621    Subjective  Pt.  reporting only with occasional quick shots of pain when reaching out now however feels improved tolerance for being out of sling.      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 Maximum retraction is 16.5 mm and the tear is 14 mm wide. Mild tendinopathy involving the infraspinatus tendon probable small intrasubstance tears. The subscapularis tendon is intact.    Patient Stated Goals  keep strengthening and mobility of R shoulder    Currently in Pain?  No/denies    Pain Score  0-No pain    Multiple Pain Sites  No                       [facility] Adult PT Treatment/Exercise - 06/04/18 1630      Shoulder Exercises: Supine   Protraction  Right;15 reps;Strengthening    Protraction Limitations  wand    External Rotation  Right;AAROM;15 reps;Strengthening    External Rotation Limitations  wand; dowel roll between elbow; R UE propped on 2 pillows    Flexion  Right;12 reps;AROM    Flexion Limitations  occasional "catching" R shoulder x 2 during set which quickly resolved     Other Supine Exercises  R shoulder circles CW, CCW x 10 each way  Shoulder Exercises: Sidelying   External Rotation  Strengthening;Right;Limitations;15 reps    External Rotation Limitations  R shoulder in neutral with dowel;     ABduction  Right;5 reps;AAROM    ABduction Limitations  manually guided       Shoulder Exercises: Pulleys   Flexion  3 minutes    Flexion Limitations  to tolerance    Scaption  3 minutes    Scaption Limitations  to tolerance      Vasopneumatic   Number Minutes Vasopneumatic   15 minutes    Vasopnuematic Location   Shoulder   R   Vasopneumatic Pressure  Low    Vasopneumatic Temperature   Coldest      Manual Therapy   Manual Therapy  Passive ROM    Passive ROM  Slow R shoulder PROM into flexion, abduction, IR/ER to tolerance with prolonged holds at end range             PT Education - 06/04/18 1656     Education provided  Yes    Education Details  HEP update HEP     Person(s) Educated  Patient    Methods  Explanation;Demonstration;Verbal cues;Handout    Comprehension  Verbalized understanding;Returned demonstration;Need further instruction       PT Short Term Goals - 05/15/18 1154      PT SHORT TERM GOAL #1   Title  Patient to be independent with initial HEP.    Time  3    Period  Weeks    Status  Achieved        PT Long Term Goals - 05/15/18 1111      PT LONG TERM GOAL #1   Title  patient to be independent with advanced HEP    Time  6    Period  Weeks    Status  On-going   met for current     PT LONG TERM GOAL #2   Title  patient to demonstrate full and non-painful AROM (including multi-planar motions) without pain    Time  9    Period  Weeks    Status  On-going   still presents with painful R shoulder AROM in all planes     PT LONG TERM GOAL #3   Title  patient to improve R shoulder strength to >/= 4+/5 without pain    Time  6    Period  Weeks    Status  On-going   held until post-surgery     PT LONG TERM GOAL #4   Title  Patient to demonstrate overhead reaching to place 5# object on shelf overhead with <=1/10 pain.    Time  6    Period  Weeks    Status  On-going   held until post-surgery     PT LONG TERM GOAL #5   Title  Patient to report tolerance of 1 full shift at work without pain.    Time  6    Period  Weeks    Status  On-going   held until post-surgery           Plan - 06/04/18 1624    Clinical Impression Statement  Sheridan doing well today noting improved tolerance for being out of sling over weekend.  Tolerated progression of sidelying ER, supine AROM flexion, and introduction to therapist guided sidelying abduction well today.  Pt. still with better tolerance for slow movement pattern with PROM and AAROM activities as shoulder musculature still tends to be guarded.  Ended with ice/compression to R shoulder to decrease post-exercise  soreness and swelling.      PT Treatment/Interventions  ADLs/Self Care Home Management;Cryotherapy;Electrical Stimulation;Moist Heat;Therapeutic exercise;Therapeutic activities;Neuromuscular re-education;Patient/family education;Manual techniques;Vasopneumatic Device;Taping;Dry needling;Passive range of motion;Ultrasound;Splinting;Energy conservation;Scar mobilization    Consulted and Agree with Plan of Care  Patient       Patient will benefit from skilled therapeutic intervention in order to improve the following deficits and impairments:  Pain, Impaired UE functional use, Decreased strength, Decreased range of motion, Decreased activity tolerance, Decreased scar mobility, Postural dysfunction, Impaired flexibility  Visit Diagnosis: Acute pain of right shoulder  Stiffness of right shoulder, not elsewhere classified  Other symptoms and signs involving the musculoskeletal system  Muscle weakness (generalized)     Problem List Patient Active Problem List   Diagnosis Date Noted  . S/P arthroscopy of right shoulder 04/17/2018    Bess Harvest, PTA 06/04/18 6:06 PM   [facility] [address]  [place], [place], [address] Phone: [phone]   Fax:  [phone]  Name: Monica Petty MRN: [id number] Date of Birth: 02/27/1966

## 2018-06-05 MED FILL — LISINOPRIL 5 MG TABLET: 5 | 90 days supply | Qty: 90 | Fill #1

## 2018-06-07 ENCOUNTER — Encounter: Payer: Self-pay | Admitting: Physical Therapy

## 2018-06-07 ENCOUNTER — Ambulatory Visit: Payer: PRIVATE HEALTH INSURANCE | Admitting: Physical Therapy

## 2018-06-07 DIAGNOSIS — M25511 Pain in right shoulder: Secondary | ICD-10-CM

## 2018-06-07 DIAGNOSIS — R29898 Other symptoms and signs involving the musculoskeletal system: Secondary | ICD-10-CM

## 2018-06-07 DIAGNOSIS — M25611 Stiffness of right shoulder, not elsewhere classified: Secondary | ICD-10-CM

## 2018-06-07 DIAGNOSIS — M6281 Muscle weakness (generalized): Secondary | ICD-10-CM

## 2018-06-07 NOTE — Therapy (Signed)
[facility] [address]  [place], [place], [address] Phone: [phone]   Fax:  [phone]  Physical Therapy Treatment  Patient Details  Name: Monica Petty MRN: [id number] Date of Birth: 05-Dec-1965 Referring Provider: Sydnee Cabal, MD   Encounter Date: 06/07/2018  PT End of Session - 06/07/18 1817    Visit Number  17    Number of Visits  29    Date for PT Re-Evaluation  07/19/18    Authorization Type  WC    Authorization Time Period  6 visits pre-op; 12 visits post-op after 08/16    Authorization - Visit Number  53    Authorization - Number of Visits  24    PT Start Time  0600    PT Stop Time  1625    PT Time Calculation (min)  55 min    Activity Tolerance  Patient tolerated treatment well    Behavior During Therapy  [facility] for tasks assessed/performed       Past Medical History:  Diagnosis Date  . Anxiety   . Diverticulosis of colon   . GERD (gastroesophageal reflux disease)    takes okra pepsin supplement  . History of concussion    1987  fell of horse--- no residual  . Hypertension   . Migraine    occasional    Past Surgical History:  Procedure Laterality Date  . COLONOSCOPY  06/06/2016  . LAPAROSCOPIC CHOLECYSTECTOMY  06-03-2008   dr Ulyess Blossom  [facility]  . REDUCTION MAMMAPLASTY Bilateral 03-05-2002   dr barber  [facility]  . SHOULDER ARTHROSCOPY WITH ROTATOR CUFF REPAIR AND SUBACROMIAL DECOMPRESSION Right 04/17/2018   Procedure: Right shoulder evaluation under anesthesia, scope, debridement, subacromial decompression, rotator cuff repair;  Surgeon: Sydnee Cabal, MD;  Location: [facility];  Service: Orthopedics;  Laterality: Right;  120 mins General with Intra Scalene Block  . VAGINAL HYSTERECTOMY  02-12-2002   dr Nori Riis  [facility]  . WISDOM TOOTH EXTRACTION  teen    There were no vitals filed for this visit.  Subjective Assessment - 06/07/18 1531    Subjective  Doing okay today. Did some  computer work today which made her shoulder a little achey. Reports she is 50% improved since initial eval. Notes improvements in ROM, pain level, being out of sling and using her arm.     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 Maximum retraction is 16.5 mm and the tear is 14 mm wide. Mild tendinopathy involving the infraspinatus tendon probable small intrasubstance tears. The subscapularis tendon is intact.    Patient Stated Goals  keep strengthening and mobility of R shoulder    Currently in Pain?  Yes    Pain Score  3     Pain Location  Shoulder    Pain Orientation  Right    Pain Descriptors / Indicators  Aching    Pain Type  Acute pain;Surgical pain         [facility] PT Assessment - 06/07/18 0001      Assessment   Medical Diagnosis  Traumatic rupture of RTC (R)    Referring Provider  Sydnee Cabal, MD    Onset Date/Surgical Date  04/17/18      AROM   AROM Assessment Site  Shoulder    Right/Left Shoulder  Right   not formally tested; flexion ~90 deg, abduction ~80deg     PROM   PROM  Assessment Site  Shoulder    Right/Left Shoulder  Right    Right Shoulder Flexion  146 Degrees    Right Shoulder ABduction  130 Degrees    Right Shoulder Internal Rotation  80 Degrees    Right Shoulder External Rotation  59 Degrees                   [facility] Adult PT Treatment/Exercise - 06/07/18 0001      Shoulder Exercises: Supine   Protraction  10 reps;Both;Strengthening    Protraction Limitations  2x10 1#    Flexion  Right;AROM;10 reps    Flexion Limitations  good tolerance      Shoulder Exercises: Seated   Flexion  AROM;Right;10 reps;Limitations    Flexion Weight (lbs)  cues to avoid shoulder hiking    Abduction  AROM;Right;10 reps;Limitations    ABduction Limitations  cues to avoid shoulder hiking      Shoulder Exercises: Sidelying   External Rotation  Strengthening;Right;Limitations;15 reps     External Rotation Limitations  R shoulder in neutral with dowel;     ABduction  Right;AROM;Limitations;5 reps    ABduction Limitations  2x5; assistance with eccentric phase      Shoulder Exercises: Pulleys   Flexion  3 minutes    Flexion Limitations  to tolerance    Scaption  3 minutes    Scaption Limitations  to tolerance      Vasopneumatic   Number Minutes Vasopneumatic   15 minutes    Vasopnuematic Location   Shoulder   R   Vasopneumatic Pressure  Low    Vasopneumatic Temperature   Coldest      Manual Therapy   Manual Therapy  Passive ROM    Passive ROM  Slow R shoulder PROM into flexion, abduction, IR/ER to tolerance with prolonged holds at end range   improved tolerance this date; nonpainful popping with ER            PT Education - 06/07/18 1817    Education provided  Yes    Education Details  update and review/consolidation of HEP    Person(s) Educated  Patient    Methods  Explanation;Demonstration;Tactile cues;Verbal cues;Handout    Comprehension  Verbalized understanding;Returned demonstration       PT Short Term Goals - 06/07/18 1534      PT SHORT TERM GOAL #1   Title  Patient to be independent with initial HEP.    Time  3    Period  Weeks    Status  Achieved        PT Long Term Goals - 06/07/18 1534      PT LONG TERM GOAL #1   Title  patient to be independent with advanced HEP    Time  6    Period  Weeks    Status  On-going   met for current   Target Date  07/19/18      PT LONG TERM GOAL #2   Title  patient to demonstrate full and non-painful AROM (including multi-planar motions) without pain    Time  6    Period  Weeks    Status  On-going   Able to tolerate limited AROM this date; PROM showing great improvement   Target Date  07/19/18      PT LONG TERM GOAL #3   Title  patient to improve R shoulder strength to >/= 4+/5 without pain    Time  6    Period  Weeks  Status  On-going   NT d/t surgical precautions   Target Date  07/19/18       PT LONG TERM GOAL #4   Title  Patient to demonstrate overhead reaching to place 5# object on shelf overhead with <=1/10 pain.    Time  6    Period  Weeks    Status  On-going   able to reach overhead with R UE to ~90 degrees at this time   Target Date  07/19/18      PT LONG TERM GOAL #5   Title  Patient to report tolerance of 1 full shift at work without pain.    Time  6    Period  Weeks    Status  On-going   patient still out of work at this time   Target Date  07/19/18            Plan - 06/07/18 1825    Clinical Impression Statement  Patient arrived to session with mild c/o soreness in R shoulder today after performing computer work earlier today. Patient reports 50% improvement since surgery. Reports she has noticed improvements in ROM, out of sling tolerance, and ability to use R UE. Updated goals- patient has shown improvements in R shoulder flexion, abduction, ER PROM.  Able to tolerate sitting R shoulder flexion and abduction for the first time today with flexion measuring ~90 deg, abduction ~80 deg with cues to avoid shoulder elevation. Strength not tested per surgical precautions. Patient still out of work at this time. Reviewed/consolidated HEP and updated with addition exercises that were well tolerated today including sitting flexion AROM, sidelying abduction, and serratus punches with small weight. Patient requested Gameready to R shoulder at end of session for soreness relief. Patient showing good improvement thus far, would benefit from skilled PT services for additional 2x/week for 6 weeks to return to PLOF. Patient in agreement.     PT Frequency  2x / week    PT Duration  6 weeks    PT Treatment/Interventions  ADLs/Self Care Home Management;Cryotherapy;Electrical Stimulation;Moist Heat;Therapeutic exercise;Therapeutic activities;Neuromuscular re-education;Patient/family education;Manual techniques;Vasopneumatic Device;Taping;Dry needling;Passive range of  motion;Ultrasound;Splinting;Energy conservation;Scar mobilization    Consulted and Agree with Plan of Care  Patient       Patient will benefit from skilled therapeutic intervention in order to improve the following deficits and impairments:  Pain, Impaired UE functional use, Decreased strength, Decreased range of motion, Decreased activity tolerance, Decreased scar mobility, Postural dysfunction, Impaired flexibility  Visit Diagnosis: Acute pain of right shoulder  Stiffness of right shoulder, not elsewhere classified  Other symptoms and signs involving the musculoskeletal system  Muscle weakness (generalized)     Problem List Patient Active Problem List   Diagnosis Date Noted  . S/P arthroscopy of right shoulder 04/17/2018    Janene Harvey, PT, DPT 06/07/18 6:28 PM   [facility] [address]  [place], [place], [address] Phone: [phone]   Fax:  [phone]  Name: Monica Petty MRN: [id number] Date of Birth: 12/28/1965

## 2018-06-11 ENCOUNTER — Ambulatory Visit: Payer: PRIVATE HEALTH INSURANCE

## 2018-06-11 DIAGNOSIS — R29898 Other symptoms and signs involving the musculoskeletal system: Secondary | ICD-10-CM

## 2018-06-11 DIAGNOSIS — M6281 Muscle weakness (generalized): Secondary | ICD-10-CM

## 2018-06-11 DIAGNOSIS — M25511 Pain in right shoulder: Secondary | ICD-10-CM | POA: Diagnosis not present

## 2018-06-11 DIAGNOSIS — M25611 Stiffness of right shoulder, not elsewhere classified: Secondary | ICD-10-CM

## 2018-06-11 NOTE — Therapy (Signed)
[facility] [address]  [place], [place], [address] Phone: [phone]   Fax:  [phone]  Physical Therapy Treatment  Patient Details  Name: Monica Petty MRN: [id number] Date of Birth: 08/06/1966 Referring Provider: Sydnee Cabal, MD   Encounter Date: 06/11/2018  PT End of Session - 06/11/18 1453    Visit Number  18    Number of Visits  29    Date for PT Re-Evaluation  07/19/18    Authorization Type  WC    Authorization Time Period  6 visits pre-op; 12 visits post-op after 08/16    Authorization - Visit Number  19    Authorization - Number of Visits  24    PT Start Time  4271    PT Stop Time  1540    PT Time Calculation (min)  55 min    Activity Tolerance  Patient tolerated treatment well    Behavior During Therapy  [facility] for tasks assessed/performed       Past Medical History:  Diagnosis Date  . Anxiety   . Diverticulosis of colon   . GERD (gastroesophageal reflux disease)    takes okra pepsin supplement  . History of concussion    1987  fell of horse--- no residual  . Hypertension   . Migraine    occasional    Past Surgical History:  Procedure Laterality Date  . COLONOSCOPY  06/06/2016  . LAPAROSCOPIC CHOLECYSTECTOMY  06-03-2008   dr Ulyess Blossom  [facility]  . REDUCTION MAMMAPLASTY Bilateral 03-05-2002   dr barber  [facility]  . SHOULDER ARTHROSCOPY WITH ROTATOR CUFF REPAIR AND SUBACROMIAL DECOMPRESSION Right 04/17/2018   Procedure: Right shoulder evaluation under anesthesia, scope, debridement, subacromial decompression, rotator cuff repair;  Surgeon: Sydnee Cabal, MD;  Location: [facility];  Service: Orthopedics;  Laterality: Right;  120 mins General with Intra Scalene Block  . VAGINAL HYSTERECTOMY  02-12-2002   dr Nori Riis  [facility]  . WISDOM TOOTH EXTRACTION  teen    There were no vitals filed for this visit.  Subjective Assessment - 06/11/18 1452    Subjective  Doing well today and notes  updated HEP going well without issue.      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 Maximum retraction is 16.5 mm and the tear is 14 mm wide. Mild tendinopathy involving the infraspinatus tendon probable small intrasubstance tears. The subscapularis tendon is intact.    Patient Stated Goals  keep strengthening and mobility of R shoulder    Currently in Pain?  No/denies    Pain Score  0-No pain    Multiple Pain Sites  No                       [facility] Adult PT Treatment/Exercise - 06/11/18 1457      Shoulder Exercises: Supine   Protraction  Both;12 reps      Shoulder Exercises: Seated   Flexion  AROM;Right;10 reps;Limitations;AAROM    Flexion Weight (lbs)  cues to avoid shoulder hiking   AAROM therapist beyond 90 dg flexion    Abduction  AROM;Right;10 reps;Limitations   AAROM therapist beyond 90 dg scaption     ABduction Limitations  cues to avoid shoulder hiking      Shoulder Exercises: Sidelying   External Rotation  Right;5 reps   2 sets    External Rotation Weight (lbs)  1  External Rotation Limitations  with bolster under elbow     ABduction  Right;10 reps;AROM      Shoulder Exercises: Pulleys   Flexion  3 minutes    Flexion Limitations  to tolerance    Scaption  3 minutes    Scaption Limitations  to tolerance      Vasopneumatic   Number Minutes Vasopneumatic   15 minutes    Vasopnuematic Location   Shoulder    Vasopneumatic Pressure  Low    Vasopneumatic Temperature   Coldest      Manual Therapy   Manual Therapy  Passive ROM    Passive ROM  Slow R shoulder PROM into flexion, abduction, IR/ER to tolerance with prolonged holds at end range             PT Education - 06/11/18 [place]    Education provided  Yes    Education Details  HEP update    Person(s) Educated  Patient    Methods  Explanation;Demonstration;Verbal cues;Handout    Comprehension  Verbalized  understanding;Returned demonstration;Verbal cues required;Need further instruction       PT Short Term Goals - 06/07/18 1534      PT SHORT TERM GOAL #1   Title  Patient to be independent with initial HEP.    Time  3    Period  Weeks    Status  Achieved        PT Long Term Goals - 06/07/18 1534      PT LONG TERM GOAL #1   Title  patient to be independent with advanced HEP    Time  6    Period  Weeks    Status  On-going   met for current   Target Date  07/19/18      PT LONG TERM GOAL #2   Title  patient to demonstrate full and non-painful AROM (including multi-planar motions) without pain    Time  6    Period  Weeks    Status  On-going   Able to tolerate limited AROM this date; PROM showing great improvement   Target Date  07/19/18      PT LONG TERM GOAL #3   Title  patient to improve R shoulder strength to >/= 4+/5 without pain    Time  6    Period  Weeks    Status  On-going   NT d/t surgical precautions   Target Date  07/19/18      PT LONG TERM GOAL #4   Title  Patient to demonstrate overhead reaching to place 5# object on shelf overhead with <=1/10 pain.    Time  6    Period  Weeks    Status  On-going   able to reach overhead with R UE to ~90 degrees at this time   Target Date  07/19/18      PT LONG TERM GOAL #5   Title  Patient to report tolerance of 1 full shift at work without pain.    Time  6    Period  Weeks    Status  On-going   patient still out of work at this time   Target Date  07/19/18            Plan - 06/11/18 1453    Clinical Impression Statement  [place] reporting updated HEP going well today.  Tolerated mild progression of scapular strengthening activities today thus HEP updated.  Tolerated manual PROM with less R shoulder guarding today.  Focused session on ensuring proper scapulohumeral rhythm with elevation activities with pt. tolerating well.      PT Treatment/Interventions  ADLs/Self Care Home Management;Cryotherapy;Electrical  Stimulation;Moist Heat;Therapeutic exercise;Therapeutic activities;Neuromuscular re-education;Patient/family education;Manual techniques;Vasopneumatic Device;Taping;Dry needling;Passive range of motion;Ultrasound;Splinting;Energy conservation;Scar mobilization    Consulted and Agree with Plan of Care  Patient       Patient will benefit from skilled therapeutic intervention in order to improve the following deficits and impairments:  Pain, Impaired UE functional use, Decreased strength, Decreased range of motion, Decreased activity tolerance, Decreased scar mobility, Postural dysfunction, Impaired flexibility  Visit Diagnosis: Acute pain of right shoulder  Stiffness of right shoulder, not elsewhere classified  Other symptoms and signs involving the musculoskeletal system  Muscle weakness (generalized)     Problem List Patient Active Problem List   Diagnosis Date Noted  . S/P arthroscopy of right shoulder 04/17/2018    Bess Harvest, PTA 06/11/18 6:28 PM   [facility] [address]  [place], [place], [address] Phone: [phone]   Fax:  [phone]  Name: Monica Petty MRN: [id number] Date of Birth: Mar 23, 1966

## 2018-06-12 DIAGNOSIS — N6001 Solitary cyst of right breast: Secondary | ICD-10-CM | POA: Diagnosis not present

## 2018-06-14 ENCOUNTER — Encounter: Payer: Self-pay | Admitting: Physical Therapy

## 2018-06-14 ENCOUNTER — Ambulatory Visit: Payer: PRIVATE HEALTH INSURANCE | Admitting: Physical Therapy

## 2018-06-14 DIAGNOSIS — M25611 Stiffness of right shoulder, not elsewhere classified: Secondary | ICD-10-CM

## 2018-06-14 DIAGNOSIS — M25511 Pain in right shoulder: Secondary | ICD-10-CM

## 2018-06-14 DIAGNOSIS — M6281 Muscle weakness (generalized): Secondary | ICD-10-CM

## 2018-06-14 DIAGNOSIS — R29898 Other symptoms and signs involving the musculoskeletal system: Secondary | ICD-10-CM

## 2018-06-14 NOTE — Therapy (Signed)
[facility] [address]  [place], [place], [address] Phone: [phone]   Fax:  [phone]  Physical Therapy Treatment  Patient Details  Name: Monica Petty MRN: [id number] Date of Birth: Jan 04, 1966 Referring Provider: Sydnee Cabal, MD   Encounter Date: 06/14/2018  PT End of Session - 06/14/18 1020    Visit Number  19    Number of Visits  29    Date for PT Re-Evaluation  07/19/18    Authorization Type  WC    Authorization Time Period  6 visits pre-op; 12 visits post-op after 08/16    Authorization - Visit Number  47    Authorization - Number of Visits  24    PT Start Time  [phone]    PT Stop Time  0945    PT Time Calculation (min)  53 min    Activity Tolerance  Patient tolerated treatment well    Behavior During Therapy  [facility] for tasks assessed/performed       Past Medical History:  Diagnosis Date  . Anxiety   . Diverticulosis of colon   . GERD (gastroesophageal reflux disease)    takes okra pepsin supplement  . History of concussion    1987  fell of horse--- no residual  . Hypertension   . Migraine    occasional    Past Surgical History:  Procedure Laterality Date  . COLONOSCOPY  06/06/2016  . LAPAROSCOPIC CHOLECYSTECTOMY  06-03-2008   dr Ulyess Blossom  [facility]  . REDUCTION MAMMAPLASTY Bilateral 03-05-2002   dr barber  [facility]  . SHOULDER ARTHROSCOPY WITH ROTATOR CUFF REPAIR AND SUBACROMIAL DECOMPRESSION Right 04/17/2018   Procedure: Right shoulder evaluation under anesthesia, scope, debridement, subacromial decompression, rotator cuff repair;  Surgeon: Sydnee Cabal, MD;  Location: [facility];  Service: Orthopedics;  Laterality: Right;  120 mins General with Intra Scalene Block  . VAGINAL HYSTERECTOMY  02-12-2002   dr Nori Riis  [facility]  . WISDOM TOOTH EXTRACTION  teen    There were no vitals filed for this visit.  Subjective Assessment - 06/14/18 0853    Subjective  Patient reports shoulder is  feeling okay overall- woke up this AM d/t moving the shoulder in uncomfortable positions.     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 Maximum retraction is 16.5 mm and the tear is 14 mm wide. Mild tendinopathy involving the infraspinatus tendon probable small intrasubstance tears. The subscapularis tendon is intact.    Patient Stated Goals  keep strengthening and mobility of R shoulder    Currently in Pain?  No/denies                       [facility] Adult PT Treatment/Exercise - 06/14/18 0001      Shoulder Exercises: Supine   Protraction  Both;Strengthening;10 reps    Protraction Limitations  2x10 2#    Flexion  Right;AROM;10 reps    Flexion Limitations  better tolerance than PROM      Shoulder Exercises: Seated   Flexion  Right;10 reps;Limitations;AAROM    Flexion Limitations  sliding orange pball into flexion to tolerance; 10x3"    Abduction  Right;10 reps;Limitations;AAROM    ABduction Limitations  sliding orange pball into abduction to tolerance; 10x3"      Shoulder Exercises: Standing   Other Standing Exercises  B UE row with yellow TB x15   cues for 90 deg  elbow flex   Other Standing Exercises  R IR/ER walkouts with yellow TB x5 each direction   pt w/ c/o burning but tolerable     Shoulder Exercises: Pulleys   Flexion  3 minutes    Flexion Limitations  to tolerance    Scaption  3 minutes    Scaption Limitations  to tolerance      Vasopneumatic   Number Minutes Vasopneumatic   15 minutes    Vasopnuematic Location   Shoulder   R   Vasopneumatic Pressure  Low    Vasopneumatic Temperature   Coldest      Manual Therapy   Manual Therapy  Passive ROM    Joint Mobilization  gentle grade III joint mobs to R shoulder in inferior direction to tolerance    Passive ROM  Slow R shoulder PROM into flexion, abduction, IR/ER to tolerance with prolonged holds at end range- emphasis on pure abduction  PROM with limited tolerance             PT Education - 06/14/18 1019    Education provided  Yes    Education Details  update to [employer]) Educated  Patient    Methods  Explanation;Demonstration;Tactile cues;Verbal cues;Handout    Comprehension  Verbalized understanding;Returned demonstration       PT Short Term Goals - 06/07/18 1534      PT SHORT TERM GOAL #1   Title  Patient to be independent with initial HEP.    Time  3    Period  Weeks    Status  Achieved        PT Long Term Goals - 06/07/18 1534      PT LONG TERM GOAL #1   Title  patient to be independent with advanced HEP    Time  6    Period  Weeks    Status  On-going   met for current   Target Date  07/19/18      PT LONG TERM GOAL #2   Title  patient to demonstrate full and non-painful AROM (including multi-planar motions) without pain    Time  6    Period  Weeks    Status  On-going   Able to tolerate limited AROM this date; PROM showing great improvement   Target Date  07/19/18      PT LONG TERM GOAL #3   Title  patient to improve R shoulder strength to >/= 4+/5 without pain    Time  6    Period  Weeks    Status  On-going   NT d/t surgical precautions   Target Date  07/19/18      PT LONG TERM GOAL #4   Title  Patient to demonstrate overhead reaching to place 5# object on shelf overhead with <=1/10 pain.    Time  6    Period  Weeks    Status  On-going   able to reach overhead with R UE to ~90 degrees at this time   Target Date  07/19/18      PT LONG TERM GOAL #5   Title  Patient to report tolerance of 1 full shift at work without pain.    Time  6    Period  Weeks    Status  On-going   patient still out of work at this time   Target Date  07/19/18            Plan - 06/14/18 1020  Clinical Impression Statement  Patient arrived to session with no new concerns. Tolerated R shoulder PROM in all planes with emphasis on abduction rather than scaption- patient still limited in  pure abduction PROM. Improved PROM with gentle grade III inferior joint mobilizations to tolerance. Instructed patient on R shoulder flexion and abduction AAROM with ball to tolerance. Able to perform resisted row and IR/ER isometrics with light banded resistance- patient reporting muscle burn but no pain. Updated HEP with abduction AAROM and scapular row- patient reported understanding. Received Gameready to R shoulder at end of session for relief of symptoms. No c/o pain at end of session.     PT Treatment/Interventions  ADLs/Self Care Home Management;Cryotherapy;Electrical Stimulation;Moist Heat;Therapeutic exercise;Therapeutic activities;Neuromuscular re-education;Patient/family education;Manual techniques;Vasopneumatic Device;Taping;Dry needling;Passive range of motion;Ultrasound;Splinting;Energy conservation;Scar mobilization    Consulted and Agree with Plan of Care  Patient       Patient will benefit from skilled therapeutic intervention in order to improve the following deficits and impairments:  Pain, Impaired UE functional use, Decreased strength, Decreased range of motion, Decreased activity tolerance, Decreased scar mobility, Postural dysfunction, Impaired flexibility  Visit Diagnosis: Acute pain of right shoulder  Stiffness of right shoulder, not elsewhere classified  Other symptoms and signs involving the musculoskeletal system  Muscle weakness (generalized)     Problem List Patient Active Problem List   Diagnosis Date Noted  . S/P arthroscopy of right shoulder 04/17/2018    Janene Harvey, PT, DPT 06/14/18 10:39 AM    [facility] [address]  [place], [place], [address] Phone: [phone]   Fax:  [phone]  Name: KALYANI MAEDA MRN: [id number] Date of Birth: [birth year]

## 2018-06-15 ENCOUNTER — Encounter

## 2018-06-19 ENCOUNTER — Ambulatory Visit: Payer: PRIVATE HEALTH INSURANCE | Attending: Nurse Practitioner | Admitting: Physical Therapy

## 2018-06-19 DIAGNOSIS — M25611 Stiffness of right shoulder, not elsewhere classified: Secondary | ICD-10-CM | POA: Diagnosis present

## 2018-06-19 DIAGNOSIS — M6281 Muscle weakness (generalized): Secondary | ICD-10-CM | POA: Diagnosis present

## 2018-06-19 DIAGNOSIS — R29898 Other symptoms and signs involving the musculoskeletal system: Secondary | ICD-10-CM | POA: Insufficient documentation

## 2018-06-19 DIAGNOSIS — M25511 Pain in right shoulder: Secondary | ICD-10-CM | POA: Diagnosis present

## 2018-06-19 DIAGNOSIS — Z111 Encounter for screening for respiratory tuberculosis: Secondary | ICD-10-CM | POA: Diagnosis not present

## 2018-06-19 NOTE — Therapy (Signed)
[facility] [address]  [place], [place], [address] Phone: [phone]   Fax:  [phone]  Physical Therapy Progress Note  Patient Details  Name: Monica Petty MRN: [id number] Date of Birth: 11/04/1965 Referring Provider (PT): Sydnee Cabal, MD   Encounter Date: 06/19/2018  PT End of Session - 06/19/18 1646    Visit Number  20    Number of Visits  29    Date for PT Re-Evaluation  07/19/18    Authorization Type  WC    Authorization Time Period  6 visits pre-op; 12 visits post-op after 08/16    Authorization - Visit Number  77    Authorization - Number of Visits  24    PT Start Time  1401    PT Stop Time  2229    PT Time Calculation (min)  56 min    Activity Tolerance  Patient tolerated treatment well    Behavior During Therapy  [facility] for tasks assessed/performed       Past Medical History:  Diagnosis Date  . Anxiety   . Diverticulosis of colon   . GERD (gastroesophageal reflux disease)    takes okra pepsin supplement  . History of concussion    1987  fell of horse--- no residual  . Hypertension   . Migraine    occasional    Past Surgical History:  Procedure Laterality Date  . COLONOSCOPY  06/06/2016  . LAPAROSCOPIC CHOLECYSTECTOMY  06-03-2008   dr Ulyess Blossom  [facility]  . REDUCTION MAMMAPLASTY Bilateral 03-05-2002   dr barber  [facility]  . SHOULDER ARTHROSCOPY WITH ROTATOR CUFF REPAIR AND SUBACROMIAL DECOMPRESSION Right 04/17/2018   Procedure: Right shoulder evaluation under anesthesia, scope, debridement, subacromial decompression, rotator cuff repair;  Surgeon: Sydnee Cabal, MD;  Location: [facility];  Service: Orthopedics;  Laterality: Right;  120 mins General with Intra Scalene Block  . VAGINAL HYSTERECTOMY  02-12-2002   dr Nori Riis  [facility]  . WISDOM TOOTH EXTRACTION  teen    There were no vitals filed for this visit.  Subjective Assessment - 06/19/18 1401    Subjective  Patient reports she  is doing well today. No complaints. Reports 55-60% improvement since surgery. Reports improvements in ROM, pain, emptying dishwasher, cooking, laundry, opening/closing door.     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 Maximum retraction is 16.5 mm and the tear is 14 mm wide. Mild tendinopathy involving the infraspinatus tendon probable small intrasubstance tears. The subscapularis tendon is intact.    Patient Stated Goals  keep strengthening and mobility of R shoulder    Currently in Pain?  No/denies         [facility] PT Assessment - 06/19/18 0001      AROM   Right/Left Shoulder  Right    Right Shoulder Flexion  125 Degrees    Right Shoulder ABduction  97 Degrees    Right Shoulder Internal Rotation  45 Degrees    Right Shoulder External Rotation  45 Degrees      PROM   PROM Assessment Site  Shoulder    Right/Left Shoulder  Right    Right Shoulder Flexion  149 Degrees    Right Shoulder ABduction  141 Degrees    Right Shoulder Internal Rotation  80 Degrees    Right Shoulder External Rotation  60 Degrees      Strength   Right Shoulder Flexion  4-/5  Right Shoulder ABduction  4-/5    Right Shoulder Internal Rotation  4-/5    Right Shoulder External Rotation  3+/5                   [facility] Adult PT Treatment/Exercise - 06/19/18 0001      Shoulder Exercises: Supine   Protraction  Both;Strengthening;10 reps    Protraction Limitations  10x 3#; 10x 4#      Shoulder Exercises: Seated   Flexion  Right;10 reps;Limitations;AAROM    Flexion Weight (lbs)  1# to 90 degrees; cues to avoid shoulder hiking      Shoulder Exercises: Sidelying   External Rotation  Strengthening;Right;10 reps;Weights;Limitations    External Rotation Weight (lbs)  1    External Rotation Limitations  2x10; dowel under elbow    ABduction  Right;AROM;Strengthening;5 reps    ABduction Limitations  1#; pt with report of burning pop at  ~120 degrees      Shoulder Exercises: Standing   Other Standing Exercises  R IR/ER walkouts with yellow TB x10 each direction   cues to maintain neutral     Shoulder Exercises: Pulleys   Flexion  3 minutes    Flexion Limitations  to tolerance    Scaption  3 minutes    Scaption Limitations  to tolerance      Vasopneumatic   Number Minutes Vasopneumatic   15 minutes    Vasopnuematic Location   Shoulder   R   Vasopneumatic Pressure  Low    Vasopneumatic Temperature   Coldest      Manual Therapy   Manual Therapy  Passive ROM    Passive ROM  Slow R shoulder PROM into flexion, abduction, IR/ER to tolerance with prolonged holds at end range- improved tolerance for abduction today             PT Education - 06/19/18 1646    Education provided  Yes    Education Details  update to [employer]) Educated  Patient    Methods  Explanation;Demonstration;Tactile cues;Verbal cues;Handout    Comprehension  Verbalized understanding;Returned demonstration       PT Short Term Goals - 06/19/18 1404      PT SHORT TERM GOAL #1   Title  Patient to be independent with initial HEP.    Time  3    Period  Weeks    Status  Achieved        PT Long Term Goals - 06/19/18 1404      PT LONG TERM GOAL #1   Title  patient to be independent with advanced HEP    Time  6    Period  Weeks    Status  Partially Met   met for current     PT LONG TERM GOAL #2   Title  patient to demonstrate full and non-painful AROM (including multi-planar motions) without pain    Time  6    Period  Weeks    Status  On-going   demonstrating improvements in all planes of R shoulder AROM and PROM flexion, abduction, and ER     PT LONG TERM GOAL #3   Title  patient to improve R shoulder strength to >/= 4+/5 without pain    Time  6    Period  Weeks    Status  On-going   atleast 3+/5 in all planes      PT LONG TERM GOAL #4   Title  Patient to demonstrate  overhead reaching to place 5# object on shelf  overhead with <=1/10 pain.    Time  6    Period  Weeks    Status  On-going   able to reach overhead with compensations in R shoulder     PT LONG TERM GOAL #5   Title  Patient to report tolerance of 1 full shift at work without pain.    Time  6    Period  Weeks    Status  On-going   patient still out of work at this time           Plan - 06/19/18 1650    Clinical Impression Statement  Patient arrived to session with no new complaints. Reports 55-60% improvement since surgery, noting improvements in ROM, pain level, and ability to perform ADLs such as cooking and cleaning with greater ease. Updated goals- patient demonstrating improvements in all planes of R shoulder AROM and PROM flexion, abduction, and ER. Able to reach overhead with compensations in R shoulder at this time, however unable to reach overhead with weighted resistance at this time. Patient scored atleast 3+/5 in R shoulder strength testing today. Patient has been compliant and diligent with all HEP exercises thus far. Progressed sidelying and seated AROM with 1lb weight to tolerance. Patient noting burning and clicking in R shoulder with sidelying abduction- advised patient to inquire about this with MD and perform this exercise to tolerance- to discontinue if pain gets worse. Updated HEP and administered handout. Patient reported understanding. Ended session with Gameready to R shoulder to ease muscle soreness. Relief noted at end of session.     PT Treatment/Interventions  ADLs/Self Care Home Management;Cryotherapy;Electrical Stimulation;Moist Heat;Therapeutic exercise;Therapeutic activities;Neuromuscular re-education;Patient/family education;Manual techniques;Vasopneumatic Device;Taping;Dry needling;Passive range of motion;Ultrasound;Splinting;Energy conservation;Scar mobilization    Consulted and Agree with Plan of Care  Patient       Patient will benefit from skilled therapeutic intervention in order to improve the  following deficits and impairments:  Pain, Impaired UE functional use, Decreased strength, Decreased range of motion, Decreased activity tolerance, Decreased scar mobility, Postural dysfunction, Impaired flexibility  Visit Diagnosis: Acute pain of right shoulder  Stiffness of right shoulder, not elsewhere classified  Other symptoms and signs involving the musculoskeletal system  Muscle weakness (generalized)     Problem List Patient Active Problem List   Diagnosis Date Noted  . S/P arthroscopy of right shoulder 04/17/2018     Janene Harvey, PT, DPT 06/19/18 5:00 PM   [facility] [address]  [place], [place], [address] Phone: [phone]   Fax:  [phone]  Name: ACADIA THAMMAVONG MRN: [id number] Date of Birth: 05/03/1966

## 2018-06-21 ENCOUNTER — Ambulatory Visit: Payer: PRIVATE HEALTH INSURANCE

## 2018-06-21 DIAGNOSIS — R29898 Other symptoms and signs involving the musculoskeletal system: Secondary | ICD-10-CM

## 2018-06-21 DIAGNOSIS — M25511 Pain in right shoulder: Secondary | ICD-10-CM

## 2018-06-21 DIAGNOSIS — M6281 Muscle weakness (generalized): Secondary | ICD-10-CM

## 2018-06-21 DIAGNOSIS — M25611 Stiffness of right shoulder, not elsewhere classified: Secondary | ICD-10-CM

## 2018-06-21 NOTE — Therapy (Signed)
[facility] [address]  [place], [place], [address] Phone: [phone]   Fax:  [phone]  Physical Therapy Treatment  Patient Details  Name: Monica Petty MRN: [id number] Date of Birth: 10/07/1965 Referring Provider (PT): Sydnee Cabal, MD   Encounter Date: 06/21/2018  PT End of Session - 06/21/18 1411    Visit Number  21    Number of Visits  29    Date for PT Re-Evaluation  07/19/18    Authorization Type  WC    Authorization Time Period  6 visits pre-op; 12 visits post-op after 08/16    Authorization - Visit Number  78    Authorization - Number of Visits  24    PT Start Time  3007    PT Stop Time  1445    PT Time Calculation (min)  42 min    Activity Tolerance  Patient tolerated treatment well    Behavior During Therapy  [facility] for tasks assessed/performed       Past Medical History:  Diagnosis Date  . Anxiety   . Diverticulosis of colon   . GERD (gastroesophageal reflux disease)    takes okra pepsin supplement  . History of concussion    1987  fell of horse--- no residual  . Hypertension   . Migraine    occasional    Past Surgical History:  Procedure Laterality Date  . COLONOSCOPY  06/06/2016  . LAPAROSCOPIC CHOLECYSTECTOMY  06-03-2008   dr Ulyess Blossom  [facility]  . REDUCTION MAMMAPLASTY Bilateral 03-05-2002   dr barber  [facility]  . SHOULDER ARTHROSCOPY WITH ROTATOR CUFF REPAIR AND SUBACROMIAL DECOMPRESSION Right 04/17/2018   Procedure: Right shoulder evaluation under anesthesia, scope, debridement, subacromial decompression, rotator cuff repair;  Surgeon: Sydnee Cabal, MD;  Location: [facility];  Service: Orthopedics;  Laterality: Right;  120 mins General with Intra Scalene Block  . VAGINAL HYSTERECTOMY  02-12-2002   dr Nori Riis  [facility]  . WISDOM TOOTH EXTRACTION  teen    There were no vitals filed for this visit.  Subjective Assessment - 06/21/18 1409    Subjective  Pt. reporting some  soreness following last visit.      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 Maximum retraction is 16.5 mm and the tear is 14 mm wide. Mild tendinopathy involving the infraspinatus tendon probable small intrasubstance tears. The subscapularis tendon is intact.    Patient Stated Goals  keep strengthening and mobility of R shoulder    Currently in Pain?  Yes    Pain Score  2     Pain Location  Shoulder    Pain Orientation  Right    Pain Descriptors / Indicators  Aching    Pain Type  Acute pain;Surgical pain    Aggravating Factors   Moving it     Pain Relieving Factors  ibuprofen     Multiple Pain Sites  No                       [facility] Adult PT Treatment/Exercise - 06/21/18 1424      Elbow Exercises   Elbow Flexion  Right;15 reps;Strengthening    Theraband Level (Elbow Flexion)  Level 2 (Red)    Elbow Flexion Limitations  Cues for scapular retraction and neutral shoulder positioning     Elbow Extension  Right;15 reps;Strengthening    Theraband Level (Elbow Extension)  Level 2 (Red)    Elbow Extension Limitations  Cues for elbow in neutral by side       Shoulder Exercises: Seated   Flexion  Both;10 reps;AROM    Flexion Weight (lbs)  to 110 dg without excessive scapular elevation    R shoulder burning from 30-120 dg range rest with relieved   Abduction  Both;10 reps;AROM    ABduction Limitations  Able to avoid excessive scapular elevation 0-100 dg    R shoulder burning from 30-100 dg range rest with relieved     Shoulder Exercises: Sidelying   ABduction  Right;15 reps    ABduction Limitations  Manual assistance for scapular upward rotation provided       Shoulder Exercises: Standing   External Rotation  Right;15 reps;Theraband    Theraband Level (Shoulder External Rotation)  Level 1 (Yellow)    External Rotation Limitations  isometric step outs    with elbow at neutral    Internal Rotation   Right;15 reps    Theraband Level (Shoulder Internal Rotation)  Level 1 (Yellow)    Internal Rotation Limitations  isometric step outs   with elbow in neutral     Shoulder Exercises: Pulleys   Flexion  3 minutes    Flexion Limitations  to tolerance    Scaption  3 minutes    Scaption Limitations  to tolerance      Manual Therapy   Manual Therapy  Passive ROM    Joint Mobilization  gentle grade III joint mobs to R shoulder in inferior direction to tolerance    Passive ROM  Slow R shoulder PROM into flexion, abduction, IR/ER to tolerance with prolonged holds at end range - mild pain increase at end range              PT Education - 06/21/18 1752    Education provided  Yes    Education Details  HEP update     Person(s) Educated  Patient    Methods  Explanation;Demonstration;Verbal cues    Comprehension  Verbalized understanding;Returned demonstration;Verbal cues required;Need further instruction       PT Short Term Goals - 06/19/18 1404      PT SHORT TERM GOAL #1   Title  Patient to be independent with initial HEP.    Time  3    Period  Weeks    Status  Achieved        PT Long Term Goals - 06/19/18 1404      PT LONG TERM GOAL #1   Title  patient to be independent with advanced HEP    Time  6    Period  Weeks    Status  Partially Met   met for current     PT LONG TERM GOAL #2   Title  patient to demonstrate full and non-painful AROM (including multi-planar motions) without pain    Time  6    Period  Weeks    Status  On-going   demonstrating improvements in all planes of R shoulder AROM and PROM flexion, abduction, and ER     PT LONG TERM GOAL #3   Title  patient to improve R shoulder strength to >/= 4+/5 without pain    Time  6    Period  Weeks    Status  On-going   atleast 3+/5 in all planes      PT LONG TERM GOAL #4   Title  Patient to demonstrate overhead reaching to place 5#  object on shelf overhead with <=1/10 pain.    Time  6    Period  Weeks     Status  On-going   able to reach overhead with compensations in R shoulder     PT LONG TERM GOAL #5   Title  Patient to report tolerance of 1 full shift at work without pain.    Time  6    Period  Weeks    Status  On-going   patient still out of work at this time           Plan - 06/21/18 1728    Clinical Impression Statement  Aniqa seen today with complaint of increased R shoulder soreness following last visit, which has lingered into today.  Tolerated all ROM and AROM well today with exception of still complaining of R anterior/lateral shoulder "burning", and "popping" with abduction/scaption elevation in seated.  Discomfort somewhat improved in this motion with scapular upward rotation manual guidance from therapist.  Will continue to progress toward goals.      PT Treatment/Interventions  ADLs/Self Care Home Management;Cryotherapy;Electrical Stimulation;Moist Heat;Therapeutic exercise;Therapeutic activities;Neuromuscular re-education;Patient/family education;Manual techniques;Vasopneumatic Device;Taping;Dry needling;Passive range of motion;Ultrasound;Splinting;Energy conservation;Scar mobilization    Consulted and Agree with Plan of Care  Patient       Patient will benefit from skilled therapeutic intervention in order to improve the following deficits and impairments:  Pain, Impaired UE functional use, Decreased strength, Decreased range of motion, Decreased activity tolerance, Decreased scar mobility, Postural dysfunction, Impaired flexibility  Visit Diagnosis: Acute pain of right shoulder  Stiffness of right shoulder, not elsewhere classified  Other symptoms and signs involving the musculoskeletal system  Muscle weakness (generalized)     Problem List Patient Active Problem List   Diagnosis Date Noted  . S/P arthroscopy of right shoulder 04/17/2018    Bess Harvest, PTA 06/21/18 5:53 PM   [facility] [address]  [place], [place], [address] Phone: [phone]   Fax:  [phone]  Name: LEVEDA KENDRIX MRN: [id number] Date of Birth: 02/05/1966

## 2018-06-26 ENCOUNTER — Encounter: Payer: Self-pay | Admitting: Physical Therapy

## 2018-06-26 ENCOUNTER — Ambulatory Visit: Payer: PRIVATE HEALTH INSURANCE | Admitting: Physical Therapy

## 2018-06-26 DIAGNOSIS — M25511 Pain in right shoulder: Secondary | ICD-10-CM | POA: Diagnosis not present

## 2018-06-26 DIAGNOSIS — M25611 Stiffness of right shoulder, not elsewhere classified: Secondary | ICD-10-CM

## 2018-06-26 DIAGNOSIS — R29898 Other symptoms and signs involving the musculoskeletal system: Secondary | ICD-10-CM

## 2018-06-26 DIAGNOSIS — M6281 Muscle weakness (generalized): Secondary | ICD-10-CM

## 2018-06-26 NOTE — Therapy (Signed)
[facility] [address]  [place], [place], [address] Phone: [phone]   Fax:  [phone]  Physical Therapy Treatment  Patient Details  Name: Monica Petty MRN: [id number] Date of Birth: Jan 02, 1966 Referring Provider (PT): Sydnee Cabal, MD   Encounter Date: 06/26/2018  PT End of Session - 06/26/18 1815    Visit Number  22    Number of Visits  29    Date for PT Re-Evaluation  07/19/18    Authorization Type  WC    Authorization Time Period  6 visits pre-op; 12 visits post-op after 08/16    Authorization - Visit Number  70    Authorization - Number of Visits  24    PT Start Time  5597    PT Stop Time  1446    PT Time Calculation (min)  43 min    Activity Tolerance  Patient tolerated treatment well;Patient limited by pain    Behavior During Therapy  [facility] for tasks assessed/performed       Past Medical History:  Diagnosis Date  . Anxiety   . Diverticulosis of colon   . GERD (gastroesophageal reflux disease)    takes okra pepsin supplement  . History of concussion    1987  fell of horse--- no residual  . Hypertension   . Migraine    occasional    Past Surgical History:  Procedure Laterality Date  . COLONOSCOPY  06/06/2016  . LAPAROSCOPIC CHOLECYSTECTOMY  06-03-2008   dr Ulyess Blossom  [facility]  . REDUCTION MAMMAPLASTY Bilateral 03-05-2002   dr barber  [facility]  . SHOULDER ARTHROSCOPY WITH ROTATOR CUFF REPAIR AND SUBACROMIAL DECOMPRESSION Right 04/17/2018   Procedure: Right shoulder evaluation under anesthesia, scope, debridement, subacromial decompression, rotator cuff repair;  Surgeon: Sydnee Cabal, MD;  Location: [facility];  Service: Orthopedics;  Laterality: Right;  120 mins General with Intra Scalene Block  . VAGINAL HYSTERECTOMY  02-12-2002   dr Nori Riis  [facility]  . WISDOM TOOTH EXTRACTION  teen    There were no vitals filed for this visit.  Subjective Assessment - 06/26/18 1404    Subjective   Reporting some muscle soreness after performing HEP with water bottle as weight. Otherwise everything going well. Looking forward to returning to work- hoping for light duty.     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 Maximum retraction is 16.5 mm and the tear is 14 mm wide. Mild tendinopathy involving the infraspinatus tendon probable small intrasubstance tears. The subscapularis tendon is intact.    Patient Stated Goals  keep strengthening and mobility of R shoulder    Currently in Pain?  No/denies         [facility] PT Assessment - 06/26/18 0001      AROM   Right/Left Shoulder  Right    Right Shoulder Flexion  121 Degrees    Right Shoulder ABduction  87 Degrees    Right Shoulder Internal Rotation  58 Degrees    Right Shoulder External Rotation  65 Degrees      PROM   PROM Assessment Site  Shoulder    Right/Left Shoulder  Right    Right Shoulder Flexion  149 Degrees    Right Shoulder ABduction  137 Degrees    Right Shoulder Internal Rotation  89 Degrees    Right Shoulder External Rotation  45 Degrees      Strength   Right Shoulder  Flexion  4-/5    Right Shoulder ABduction  4-/5    Right Shoulder Internal Rotation  4-/5    Right Shoulder External Rotation  3+/5                   [facility] Adult PT Treatment/Exercise - 06/26/18 0001      Exercises   Exercises  Shoulder      Shoulder Exercises: Supine   Protraction  Both;Strengthening;15 reps    Protraction Limitations  3#      Shoulder Exercises: Sidelying   External Rotation  Strengthening;Right;10 reps;Weights;Limitations    External Rotation Weight (lbs)  1    External Rotation Limitations  10x; dowel under elbow    ABduction  Right;10 reps;AROM    ABduction Limitations  1#; cues for scap retraction; intermittent R shoulder popping without pain      Shoulder Exercises: Standing   External Rotation  Strengthening;Right;10  reps;Theraband;Limitations    Theraband Level (Shoulder External Rotation)  Level 1 (Yellow)    External Rotation Weight (lbs)  cues for elbow in at trunk    Internal Rotation  Right;Strengthening;10 reps;Theraband;Limitations    Theraband Level (Shoulder Internal Rotation)  Level 1 (Yellow)    Internal Rotation Limitations  cues for elbow in at trunk      Shoulder Exercises: Pulleys   Flexion  3 minutes    Flexion Limitations  to tolerance    Scaption  3 minutes    Scaption Limitations  to tolerance      Shoulder Exercises: Stretch   Cross Chest Stretch  1 rep;20 seconds      Manual Therapy   Manual Therapy  Passive ROM;Soft tissue mobilization;Muscle Energy Technique    Joint Mobilization  slight jt distraction during PROM    Soft tissue mobilization  STM to R teres/infra group- patient reporting pain relief    Passive ROM  Slow R shoulder PROM into flexion, abduction, IR/ER to tolerance with prolonged holds at end range - mild pain increase at end range     Muscle Energy Technique  R shoudler IR MET for increased ER; 3x5"             PT Education - 06/26/18 1815    Education provided  Yes    Education Details  update to [employer]) Educated  Patient    Methods  Explanation;Demonstration;Tactile cues;Verbal cues;Handout    Comprehension  Verbalized understanding;Returned demonstration       PT Short Term Goals - 06/26/18 1405      PT SHORT TERM GOAL #1   Title  Patient to be independent with initial HEP.    Time  3    Period  Weeks    Status  Achieved        PT Long Term Goals - 06/26/18 1405      PT LONG TERM GOAL #1   Title  patient to be independent with advanced HEP    Time  6    Period  Weeks    Status  Partially Met   met for current     PT LONG TERM GOAL #2   Title  patient to demonstrate full and non-painful AROM (including multi-planar motions) without pain    Time  6    Period  Weeks    Status  On-going   No improvements in AROM since  last measured ~1 week ago, however large improvement since last progress note; improvement in R shoulder IR PROM  PT LONG TERM GOAL #3   Title  patient to improve R shoulder strength to >/= 4+/5 without pain    Time  6    Period  Weeks    Status  On-going   able to tolerate gentle resistance, worst with ER     PT LONG TERM GOAL #4   Title  Patient to demonstrate overhead reaching to place 5# object on shelf overhead with <=1/10 pain.    Time  6    Period  Weeks    Status  On-going   able to reach overhead with compensations in R shoulder     PT LONG TERM GOAL #5   Title  Patient to report tolerance of 1 full shift at work without pain.    Time  6    Period  Weeks    Status  On-going   patient still out of work at this time           Plan - 06/26/18 1826    Clinical Impression Statement  Patient today reporting R shoulder muscle soreness after performing sidelying ER exercise. Reporting she is looking forward to returning to work. Updated goals- No improvements in AROM since last measured ~1 week ago, however large improvement since last progress note. Has shown improvement in R shoulder IR PROM. Likely that slight decreased in ROM today is caused by muscle soreness. Patient able to tolerate gentle resistance with strength testing, weakest in ER.  Able to reach overhead with compensations in R shoulder. Focused on manual therapy to R shoulder today to address ROM impairments and tightness/soreness in R posterior shoulder. Improvement in ROM noted after STM to teres/infraspinatus group. Patient tolerated progression of IR/ER with banded resistance. Also performed cross body stretch with relief of tightness in posterior shoulder. Added these exercises to HEP. Patient reported understanding. Ended session without modalities as patient reporting she will ice at home.     PT Treatment/Interventions  ADLs/Self Care Home Management;Cryotherapy;Electrical Stimulation;Moist Heat;Therapeutic  exercise;Therapeutic activities;Neuromuscular re-education;Patient/family education;Manual techniques;Vasopneumatic Device;Taping;Dry needling;Passive range of motion;Ultrasound;Splinting;Energy conservation;Scar mobilization    Consulted and Agree with Plan of Care  Patient       Patient will benefit from skilled therapeutic intervention in order to improve the following deficits and impairments:  Pain, Impaired UE functional use, Decreased strength, Decreased range of motion, Decreased activity tolerance, Decreased scar mobility, Postural dysfunction, Impaired flexibility  Visit Diagnosis: Acute pain of right shoulder  Stiffness of right shoulder, not elsewhere classified  Other symptoms and signs involving the musculoskeletal system  Muscle weakness (generalized)     Problem List Patient Active Problem List   Diagnosis Date Noted  . S/P arthroscopy of right shoulder 04/17/2018    Janene Harvey, PT, DPT 06/26/18 6:27 PM   [facility] [address]  [place], [place], [address] Phone: [phone]   Fax:  [phone]  Name: MARIVEL MCCLARTY MRN: [id number] Date of Birth: 10-11-1965

## 2018-06-28 ENCOUNTER — Ambulatory Visit: Payer: PRIVATE HEALTH INSURANCE | Admitting: Physical Therapy

## 2018-06-28 DIAGNOSIS — M25611 Stiffness of right shoulder, not elsewhere classified: Secondary | ICD-10-CM

## 2018-06-28 DIAGNOSIS — M25511 Pain in right shoulder: Secondary | ICD-10-CM | POA: Diagnosis not present

## 2018-06-28 DIAGNOSIS — R29898 Other symptoms and signs involving the musculoskeletal system: Secondary | ICD-10-CM

## 2018-06-28 DIAGNOSIS — M6281 Muscle weakness (generalized): Secondary | ICD-10-CM

## 2018-06-28 NOTE — Therapy (Signed)
[facility] [address]  [place], [place], [address] Phone: [phone]   Fax:  [phone]  Physical Therapy Treatment  Patient Details  Name: Monica Petty MRN: [id number] Date of Birth: [birth year] Referring Provider (PT): Sydnee Cabal, MD   Encounter Date: 06/28/2018  PT End of Session - 06/28/18 1447    Visit Number  23    Number of Visits  29    Date for PT Re-Evaluation  07/19/18    Authorization Type  WC    Authorization - Visit Number  17    Authorization - Number of Visits  24    PT Start Time  1401    PT Stop Time  8786    PT Time Calculation (min)  58 min    Activity Tolerance  Patient tolerated treatment well;Patient limited by pain    Behavior During Therapy  [facility] for tasks assessed/performed       Past Medical History:  Diagnosis Date  . Anxiety   . Diverticulosis of colon   . GERD (gastroesophageal reflux disease)    takes okra pepsin supplement  . History of concussion    1987  fell of horse--- no residual  . Hypertension   . Migraine    occasional    Past Surgical History:  Procedure Laterality Date  . COLONOSCOPY  06/06/2016  . LAPAROSCOPIC CHOLECYSTECTOMY  06-03-2008   dr Ulyess Blossom  [facility]  . REDUCTION MAMMAPLASTY Bilateral 03-05-2002   dr barber  [facility]  . SHOULDER ARTHROSCOPY WITH ROTATOR CUFF REPAIR AND SUBACROMIAL DECOMPRESSION Right 04/17/2018   Procedure: Right shoulder evaluation under anesthesia, scope, debridement, subacromial decompression, rotator cuff repair;  Surgeon: Sydnee Cabal, MD;  Location: [facility];  Service: Orthopedics;  Laterality: Right;  120 mins General with Intra Scalene Block  . VAGINAL HYSTERECTOMY  02-12-2002   dr Nori Riis  [facility]  . WISDOM TOOTH EXTRACTION  teen    There were no vitals filed for this visit.  Subjective Assessment - 06/28/18 1423    Subjective  Pt relays her MD was pleased with her progress at F/U. She relays no pain  upon arrival and she has been cleared to return to work light duty.     Limitations  Lifting;House hold activities    Currently in Pain?  Yes    Pain Score  0-No pain                       [facility] Adult PT Treatment/Exercise - 06/28/18 0001      Exercises   Exercises  Shoulder      Shoulder Exercises: Supine   Protraction  Both;Strengthening;20 reps    Protraction Limitations  3#    Flexion  Right;AROM;15 reps    Shoulder Flexion Weight (lbs)  1      Shoulder Exercises: Sidelying   External Rotation  Strengthening;Right;Weights;Limitations;15 reps    External Rotation Weight (lbs)  1    ABduction  Right;15 reps    ABduction Limitations  1#; cues for scap retraction; intermittent R shoulder popping without pain      Shoulder Exercises: Standing   External Rotation  Strengthening;Right;Theraband;Limitations;20 reps    Theraband Level (Shoulder External Rotation)  Level 1 (Yellow)    Internal Rotation  Strengthening;20 reps    Theraband Level (Shoulder Internal Rotation)  Level 1 (Yellow)    Other Standing Exercises  ball rolls 1 lb small green ball at wall at 90  deg shoulder ht for shoulder stability/endurance, up/down, lateral, and circles CW/CCW X 15 ea    Other Standing Exercises  wall ladder scaption  X5      Shoulder Exercises: Pulleys   Flexion  3 minutes    Flexion Limitations  to tolerance    Scaption  3 minutes    Scaption Limitations  to tolerance      Shoulder Exercises: Stretch   Cross Chest Stretch  3 reps;30 seconds      Vasopneumatic   Number Minutes Vasopneumatic   15 minutes    Vasopnuematic Location   Shoulder    Vasopneumatic Pressure  Low    Vasopneumatic Temperature   Coldest      Manual Therapy   Joint Mobilization  distraction, ant-post glides, and inferior glides grade 1-2    Passive ROM  Slow R shoulder PROM into flexion, abduction, IR/ER to tolerance with prolonged holds at end range - mild pain increase at end range but ROM  increased               PT Short Term Goals - 06/26/18 1405      PT SHORT TERM GOAL #1   Title  Patient to be independent with initial HEP.    Time  3    Period  Weeks    Status  Achieved        PT Long Term Goals - 06/26/18 1405      PT LONG TERM GOAL #1   Title  patient to be independent with advanced HEP    Time  6    Period  Weeks    Status  Partially Met   met for current     PT LONG TERM GOAL #2   Title  patient to demonstrate full and non-painful AROM (including multi-planar motions) without pain    Time  6    Period  Weeks    Status  On-going   No improvements in AROM since last measured ~1 week ago, however large improvement since last progress note; improvement in R shoulder IR PROM     PT LONG TERM GOAL #3   Title  patient to improve R shoulder strength to >/= 4+/5 without pain    Time  6    Period  Weeks    Status  On-going   able to tolerate gentle resistance, worst with ER     PT LONG TERM GOAL #4   Title  Patient to demonstrate overhead reaching to place 5# object on shelf overhead with <=1/10 pain.    Time  6    Period  Weeks    Status  On-going   able to reach overhead with compensations in R shoulder     PT LONG TERM GOAL #5   Title  Patient to report tolerance of 1 full shift at work without pain.    Time  6    Period  Weeks    Status  On-going   patient still out of work at this time           Plan - 06/28/18 1449    Clinical Impression Statement  Pt was able to progress her strengthening program with no complaints of pain but she can tell her muscles are being worked. She was shown ball rolls at wall to further increase shoulder stability and endurance. She also had more ROM today with visual assessment. PT will continue to progress as tolerated.     Rehab Potential  Good    PT Frequency  2x / week    PT Duration  6 weeks    PT Treatment/Interventions  ADLs/Self Care Home Management;Cryotherapy;Electrical Stimulation;Moist  Heat;Therapeutic exercise;Therapeutic activities;Neuromuscular re-education;Patient/family education;Manual techniques;Vasopneumatic Device;Taping;Dry needling;Passive range of motion;Ultrasound;Splinting;Energy conservation;Scar mobilization    Consulted and Agree with Plan of Care  Patient       Patient will benefit from skilled therapeutic intervention in order to improve the following deficits and impairments:  Pain, Impaired UE functional use, Decreased strength, Decreased range of motion, Decreased activity tolerance, Decreased scar mobility, Postural dysfunction, Impaired flexibility  Visit Diagnosis: Acute pain of right shoulder  Stiffness of right shoulder, not elsewhere classified  Other symptoms and signs involving the musculoskeletal system  Muscle weakness (generalized)     Problem List Patient Active Problem List   Diagnosis Date Noted  . S/P arthroscopy of right shoulder 04/17/2018    Debbe Odea, PT, DPT 06/28/2018, 2:53 PM  [facility] [address]  [place], [place], [address] Phone: [phone]   Fax:  [phone]  Name: SANTANNA WHITFORD MRN: [id number] Date of Birth: [birth year]

## 2018-07-03 ENCOUNTER — Ambulatory Visit: Payer: PRIVATE HEALTH INSURANCE | Admitting: Physical Therapy

## 2018-07-03 DIAGNOSIS — R29898 Other symptoms and signs involving the musculoskeletal system: Secondary | ICD-10-CM

## 2018-07-03 DIAGNOSIS — M6281 Muscle weakness (generalized): Secondary | ICD-10-CM

## 2018-07-03 DIAGNOSIS — M25511 Pain in right shoulder: Secondary | ICD-10-CM

## 2018-07-03 DIAGNOSIS — M25611 Stiffness of right shoulder, not elsewhere classified: Secondary | ICD-10-CM

## 2018-07-03 NOTE — Therapy (Signed)
[facility] [address]  [place], [place], [address] Phone: [phone]   Fax:  [phone]  Physical Therapy Treatment  Patient Details  Name: Monica Petty MRN: [id number] Date of Birth: 03-Mar-1966 Referring Provider (PT): Sydnee Cabal, MD   Encounter Date: 07/03/2018  PT End of Session - 07/03/18 1441    Visit Number  24    Number of Visits  29    Date for PT Re-Evaluation  07/19/18    Authorization Type  WC    Authorization Time Period  6 visits pre-op; 12 visits    Authorization - Visit Number  59    Authorization - Number of Visits  24    PT Start Time  1401    PT Stop Time  1455    PT Time Calculation (min)  54 min    Activity Tolerance  Patient tolerated treatment well;Patient limited by pain    Behavior During Therapy  [facility] for tasks assessed/performed       Past Medical History:  Diagnosis Date  . Anxiety   . Diverticulosis of colon   . GERD (gastroesophageal reflux disease)    takes okra pepsin supplement  . History of concussion    1987  fell of horse--- no residual  . Hypertension   . Migraine    occasional    Past Surgical History:  Procedure Laterality Date  . COLONOSCOPY  06/06/2016  . LAPAROSCOPIC CHOLECYSTECTOMY  06-03-2008   dr Ulyess Blossom  [facility]  . REDUCTION MAMMAPLASTY Bilateral 03-05-2002   dr barber  [facility]  . SHOULDER ARTHROSCOPY WITH ROTATOR CUFF REPAIR AND SUBACROMIAL DECOMPRESSION Right 04/17/2018   Procedure: Right shoulder evaluation under anesthesia, scope, debridement, subacromial decompression, rotator cuff repair;  Surgeon: Sydnee Cabal, MD;  Location: [facility];  Service: Orthopedics;  Laterality: Right;  120 mins General with Intra Scalene Block  . VAGINAL HYSTERECTOMY  02-12-2002   dr Nori Riis  [facility]  . WISDOM TOOTH EXTRACTION  teen    There were no vitals filed for this visit.  Subjective Assessment - 07/03/18 1403    Subjective  Pt relays her  shoulder is making progress    Currently in Pain?  No/denies                       [facility] Adult PT Treatment/Exercise - 07/03/18 0001      Exercises   Exercises  Shoulder      Shoulder Exercises: Supine   Protraction  Both;Strengthening;20 reps    Protraction Limitations  3#    Flexion  Right;AROM;20 reps    Shoulder Flexion Weight (lbs)  1      Shoulder Exercises: Sidelying   External Rotation  Strengthening;Right;Weights;Limitations;20 reps    External Rotation Weight (lbs)  1    ABduction  Right;20 reps    ABduction Limitations  1 lb      Shoulder Exercises: Standing   External Rotation  Strengthening;Right;Theraband;Limitations;20 reps    Theraband Level (Shoulder External Rotation)  Level 2 (Red)    Internal Rotation  Strengthening;20 reps    Theraband Level (Shoulder Internal Rotation)  Level 2 (Red)    Other Standing Exercises  ball rolls 1 lb small green ball at wall at 90 deg shoulder ht for shoulder stability/endurance, up/down, lateral, and circles CW/CCW X 15 ea      Shoulder Exercises: Pulleys   Flexion  3 minutes    Flexion Limitations  to  tolerance    Scaption  3 minutes    Scaption Limitations  to tolerance      Shoulder Exercises: Stretch   Cross Chest Stretch  3 reps;30 seconds      Vasopneumatic   Number Minutes Vasopneumatic   15 minutes    Vasopnuematic Location   Shoulder    Vasopneumatic Pressure  Low    Vasopneumatic Temperature   Coldest      Manual Therapy   Joint Mobilization  distraction, ant-post glides, and inferior glides grade 1-2    Passive ROM  Slow R shoulder PROM into flexion, abduction, IR/ER to tolerance with prolonged holds at end range - mild pain increase at end range but ROM increased               PT Short Term Goals - 06/26/18 1405      PT SHORT TERM GOAL #1   Title  Patient to be independent with initial HEP.    Time  3    Period  Weeks    Status  Achieved        PT Long Term Goals -  06/26/18 1405      PT LONG TERM GOAL #1   Title  patient to be independent with advanced HEP    Time  6    Period  Weeks    Status  Partially Met   met for current     PT LONG TERM GOAL #2   Title  patient to demonstrate full and non-painful AROM (including multi-planar motions) without pain    Time  6    Period  Weeks    Status  On-going   No improvements in AROM since last measured ~1 week ago, however large improvement since last progress note; improvement in R shoulder IR PROM     PT LONG TERM GOAL #3   Title  patient to improve R shoulder strength to >/= 4+/5 without pain    Time  6    Period  Weeks    Status  On-going   able to tolerate gentle resistance, worst with ER     PT LONG TERM GOAL #4   Title  Patient to demonstrate overhead reaching to place 5# object on shelf overhead with <=1/10 pain.    Time  6    Period  Weeks    Status  On-going   able to reach overhead with compensations in R shoulder     PT LONG TERM GOAL #5   Title  Patient to report tolerance of 1 full shift at work without pain.    Time  6    Period  Weeks    Status  On-going   patient still out of work at this time           Plan - 07/03/18 1445    Clinical Impression Statement  Pt again able to progress resistance and or reps with with strengthening program with good tolerance. She continues to make good progress with shoulder strength and ROM and PT will progress as able.     Rehab Potential  Good    PT Frequency  2x / week    PT Duration  6 weeks    PT Treatment/Interventions  ADLs/Self Care Home Management;Cryotherapy;Electrical Stimulation;Moist Heat;Therapeutic exercise;Therapeutic activities;Neuromuscular re-education;Patient/family education;Manual techniques;Vasopneumatic Device;Taping;Dry needling;Passive range of motion;Ultrasound;Splinting;Energy conservation;Scar mobilization    Consulted and Agree with Plan of Care  Patient       Patient will benefit from skilled  therapeutic intervention in order to improve the following deficits and impairments:  Pain, Impaired UE functional use, Decreased strength, Decreased range of motion, Decreased activity tolerance, Decreased scar mobility, Postural dysfunction, Impaired flexibility  Visit Diagnosis: Acute pain of right shoulder  Stiffness of right shoulder, not elsewhere classified  Other symptoms and signs involving the musculoskeletal system  Muscle weakness (generalized)     Problem List Patient Active Problem List   Diagnosis Date Noted  . S/P arthroscopy of right shoulder 04/17/2018    Debbe Odea, PT, DPT 07/03/2018, 2:47 PM  [facility] [address]  [place], [place], [address] Phone: [phone]   Fax:  [phone]  Name: MARIENA MEARES MRN: [id number] Date of Birth: 04/03/1966

## 2018-07-05 ENCOUNTER — Ambulatory Visit: Payer: PRIVATE HEALTH INSURANCE | Admitting: Physical Therapy

## 2018-07-05 ENCOUNTER — Encounter: Payer: Self-pay | Admitting: Physical Therapy

## 2018-07-05 DIAGNOSIS — M25511 Pain in right shoulder: Secondary | ICD-10-CM | POA: Diagnosis not present

## 2018-07-05 DIAGNOSIS — M25611 Stiffness of right shoulder, not elsewhere classified: Secondary | ICD-10-CM

## 2018-07-05 DIAGNOSIS — R29898 Other symptoms and signs involving the musculoskeletal system: Secondary | ICD-10-CM

## 2018-07-05 DIAGNOSIS — M6281 Muscle weakness (generalized): Secondary | ICD-10-CM

## 2018-07-05 NOTE — Therapy (Signed)
[facility] [address]  [place], [place], [address] Phone: [phone]   Fax:  [phone]  Physical Therapy Treatment  Patient Details  Name: AUSET FRITZLER MRN: [id number] Date of Birth: 10-Jun-1966 Referring Provider (PT): Sydnee Cabal, MD   Encounter Date: 07/05/2018  PT End of Session - 07/05/18 1541    Visit Number  25    Number of Visits  29    Date for PT Re-Evaluation  07/19/18    Authorization Type  WC    Authorization Time Period  6 visits pre-op; 12 visits    Authorization - Visit Number  21    Authorization - Number of Visits  36    PT Start Time  1407    PT Stop Time  1455    PT Time Calculation (min)  48 min    Activity Tolerance  Patient tolerated treatment well    Behavior During Therapy  [facility] for tasks assessed/performed       Past Medical History:  Diagnosis Date  . Anxiety   . Diverticulosis of colon   . GERD (gastroesophageal reflux disease)    takes okra pepsin supplement  . History of concussion    1987  fell of horse--- no residual  . Hypertension   . Migraine    occasional    Past Surgical History:  Procedure Laterality Date  . COLONOSCOPY  06/06/2016  . LAPAROSCOPIC CHOLECYSTECTOMY  06-03-2008   dr Ulyess Blossom  [facility]  . REDUCTION MAMMAPLASTY Bilateral 03-05-2002   dr barber  [facility]  . SHOULDER ARTHROSCOPY WITH ROTATOR CUFF REPAIR AND SUBACROMIAL DECOMPRESSION Right 04/17/2018   Procedure: Right shoulder evaluation under anesthesia, scope, debridement, subacromial decompression, rotator cuff repair;  Surgeon: Sydnee Cabal, MD;  Location: [facility];  Service: Orthopedics;  Laterality: Right;  120 mins General with Intra Scalene Block  . VAGINAL HYSTERECTOMY  02-12-2002   dr Nori Riis  [facility]  . WISDOM TOOTH EXTRACTION  teen    There were no vitals filed for this visit.  Subjective Assessment - 07/05/18 1409    Subjective  Reports she is doing well. Got held up by  traffic today. Hopped up on kitchen counter today and made her shoulder a bit sore. Reports she did it without thinking.     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 Maximum retraction is 16.5 mm and the tear is 14 mm wide. Mild tendinopathy involving the infraspinatus tendon probable small intrasubstance tears. The subscapularis tendon is intact.    Patient Stated Goals  keep strengthening and mobility of R shoulder    Currently in Pain?  Yes    Pain Score  3     Pain Location  Shoulder    Pain Orientation  Right    Pain Descriptors / Indicators  Sore    Pain Type  Acute pain;Surgical pain                       [facility] Adult PT Treatment/Exercise - 07/05/18 0001      Exercises   Exercises  Shoulder      Shoulder Exercises: Supine   Horizontal ABduction  Strengthening;Both;10 reps;Theraband;Limitations    Theraband Level (Shoulder Horizontal ABduction)  Level 1 (Yellow);Level 2 (Red)    Horizontal ABduction Limitations  10x yellow; 10x red; cues for scap retraction    External Rotation  Strengthening;Both;10 reps;Theraband;Limitations  Theraband Level (Shoulder External Rotation)  Level 1 (Yellow)    External Rotation Limitations  2x10; dowels under elbows to maintain elbow in      Shoulder Exercises: Prone   Flexion  Right;Strengthening;10 reps;Limitations    Other Prone Exercises  R prone row with 2# leaning over countert    Other Prone Exercises  R prone extension with 2# leaning over counter top      Shoulder Exercises: Pulleys   Flexion  3 minutes    Flexion Limitations  to tolerance    Scaption  3 minutes    Scaption Limitations  to tolerance      Shoulder Exercises: Stretch   Corner Stretch  2 reps;30 seconds;Limitations    Corner Stretch Limitations  mid pec stretch R UE to tolerance      Vasopneumatic   Number Minutes Vasopneumatic   15 minutes    Vasopnuematic Location   Shoulder    R   Vasopneumatic Pressure  Low    Vasopneumatic Temperature   Coldest      Manual Therapy   Manual Therapy  Soft tissue mobilization;Passive ROM    Soft tissue mobilization  STM to R deltoid, pec and bicep- most tenderness in pec    Passive ROM  Slow R shoulder PROM into flexion, abduction, IR/ER to tolerance with prolonged holds at end range - good tolerance today             PT Education - 07/05/18 1537    Education provided  Yes    Education Details  advised to add 2# weights to prone row and extension    Person(s) Educated  Patient    Methods  Explanation;Demonstration    Comprehension  Verbalized understanding;Returned demonstration       PT Short Term Goals - 06/26/18 1405      PT SHORT TERM GOAL #1   Title  Patient to be independent with initial HEP.    Time  3    Period  Weeks    Status  Achieved        PT Long Term Goals - 06/26/18 1405      PT LONG TERM GOAL #1   Title  patient to be independent with advanced HEP    Time  6    Period  Weeks    Status  Partially Met   met for current     PT LONG TERM GOAL #2   Title  patient to demonstrate full and non-painful AROM (including multi-planar motions) without pain    Time  6    Period  Weeks    Status  On-going   No improvements in AROM since last measured ~1 week ago, however large improvement since last progress note; improvement in R shoulder IR PROM     PT LONG TERM GOAL #3   Title  patient to improve R shoulder strength to >/= 4+/5 without pain    Time  6    Period  Weeks    Status  On-going   able to tolerate gentle resistance, worst with ER     PT LONG TERM GOAL #4   Title  Patient to demonstrate overhead reaching to place 5# object on shelf overhead with <=1/10 pain.    Time  6    Period  Weeks    Status  On-going   able to reach overhead with compensations in R shoulder     PT LONG TERM GOAL #5   Title  Patient  to report tolerance of 1 full shift at work without pain.    Time  6     Period  Weeks    Status  On-going   patient still out of work at this time           Plan - 07/05/18 1822    Clinical Impression Statement  Patient reporting that she hopped up on counter top today which caused some pain in R shoulder. Tolerated STM to R deltoid, pec and bicep- most tenderness in pec region. Progressing well with PROM, with good tolerance for PROM today without much tenderness. Introduced supine horizontal abduction and B ER with banded resistance with some difficulty with ER but no pain. Progressed prone rows and extension with 2 lbs today and advised patient to progress this exercise at home with same amount of weight. Patient reported understanding. Performed prone Y's on R UE with some difficulty but good form. Ended session with [place] to R shoulder at end of session for pain relief. Patient without complaints at end of session.     PT Treatment/Interventions  ADLs/Self Care Home Management;Cryotherapy;Electrical Stimulation;Moist Heat;Therapeutic exercise;Therapeutic activities;Neuromuscular re-education;Patient/family education;Manual techniques;Vasopneumatic Device;Taping;Dry needling;Passive range of motion;Ultrasound;Splinting;Energy conservation;Scar mobilization    Consulted and Agree with Plan of Care  Patient       Patient will benefit from skilled therapeutic intervention in order to improve the following deficits and impairments:  Pain, Impaired UE functional use, Decreased strength, Decreased range of motion, Decreased activity tolerance, Decreased scar mobility, Postural dysfunction, Impaired flexibility  Visit Diagnosis: Acute pain of right shoulder  Stiffness of right shoulder, not elsewhere classified  Other symptoms and signs involving the musculoskeletal system  Muscle weakness (generalized)     Problem List Patient Active Problem List   Diagnosis Date Noted  . S/P arthroscopy of right shoulder 04/17/2018    Janene Harvey, PT,  DPT 07/05/18 6:26 PM   [facility] [address]  [place], [place], [address] Phone: [phone]   Fax:  [phone]  Name: RISHA BARRETTA MRN: [id number] Date of Birth: Nov 17, 1965

## 2018-07-10 ENCOUNTER — Ambulatory Visit: Payer: PRIVATE HEALTH INSURANCE

## 2018-07-10 DIAGNOSIS — M25611 Stiffness of right shoulder, not elsewhere classified: Secondary | ICD-10-CM

## 2018-07-10 DIAGNOSIS — M6281 Muscle weakness (generalized): Secondary | ICD-10-CM

## 2018-07-10 DIAGNOSIS — M25511 Pain in right shoulder: Secondary | ICD-10-CM

## 2018-07-10 DIAGNOSIS — R29898 Other symptoms and signs involving the musculoskeletal system: Secondary | ICD-10-CM

## 2018-07-10 MED FILL — METHOCARBAMOL 500 MG TABLET: 500 | 17 days supply | Qty: 50 | Fill #0

## 2018-07-10 NOTE — Therapy (Signed)
[facility] [address]  [place], [place], [address] Phone: [phone]   Fax:  [phone]  Physical Therapy Treatment  Patient Details  Name: Monica Petty MRN: [id number] Date of Birth: 07-Dec-1965 Referring Provider (PT): Sydnee Cabal, MD   Encounter Date: 07/10/2018  PT End of Session - 07/10/18 1404    Visit Number  26    Number of Visits  29    Date for PT Re-Evaluation  07/19/18    Authorization Type  WC    Authorization Time Period  6 visits pre-op; 12 visits    Authorization - Visit Number  78    Authorization - Number of Visits  36    PT Start Time  1400    PT Stop Time  1457    PT Time Calculation (min)  57 min    Activity Tolerance  Patient tolerated treatment well    Behavior During Therapy  [facility] for tasks assessed/performed       Past Medical History:  Diagnosis Date  . Anxiety   . Diverticulosis of colon   . GERD (gastroesophageal reflux disease)    takes okra pepsin supplement  . History of concussion    1987  fell of horse--- no residual  . Hypertension   . Migraine    occasional    Past Surgical History:  Procedure Laterality Date  . COLONOSCOPY  06/06/2016  . LAPAROSCOPIC CHOLECYSTECTOMY  06-03-2008   dr Ulyess Blossom  [name]  . REDUCTION MAMMAPLASTY Bilateral 03-05-2002   dr barber  [facility]  . SHOULDER ARTHROSCOPY WITH ROTATOR CUFF REPAIR AND SUBACROMIAL DECOMPRESSION Right 04/17/2018   Procedure: Right shoulder evaluation under anesthesia, scope, debridement, subacromial decompression, rotator cuff repair;  Surgeon: Sydnee Cabal, MD;  Location: [facility];  Service: Orthopedics;  Laterality: Right;  120 mins General with Intra Scalene Block  . VAGINAL HYSTERECTOMY  02-12-2002   dr Nori Riis  [facility]  . WISDOM TOOTH EXTRACTION  teen    There were no vitals filed for this visit.  Subjective Assessment - 07/10/18 1408    Subjective  Pt. reporting reaching activities somewhat  easier now.      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 Maximum retraction is 16.5 mm and the tear is 14 mm wide. Mild tendinopathy involving the infraspinatus tendon probable small intrasubstance tears. The subscapularis tendon is intact.    Patient Stated Goals  keep strengthening and mobility of R shoulder    Currently in Pain?  Yes    Pain Score  2     Pain Location  Shoulder    Pain Orientation  Right    Pain Descriptors / Indicators  Sore    Pain Type  Acute pain;Surgical pain    Aggravating Factors   reaching     Multiple Pain Sites  No                       [facility] Adult PT Treatment/Exercise - 07/10/18 1420      Shoulder Exercises: Supine   Protraction  Both;Strengthening;20 reps    Protraction Limitations  3#      Shoulder Exercises: Prone   Retraction  Right;10 reps    Retraction Weight (lbs)  3    Retraction Limitations  Cues for scapular retraction; elbow to neutral     Extension  Right;10 reps;Weights    Extension Weight (lbs)  2    Extension Limitations  to neutral       Shoulder Exercises: Standing   External Rotation  15 reps;Right;Strengthening    Theraband Level (Shoulder External Rotation)  Level 2 (Red)    External Rotation Limitations  Cues for full scap. retraction provided    Internal Rotation  Right;15 reps;Strengthening    Theraband Level (Shoulder Internal Rotation)  Level 2 (Red)    Flexion  Right;10 reps;Strengthening    Flexion Limitations  Orange p-ball roll up wall    ABduction  Right;10 reps    ABduction Limitations  scaption; orange p-ball roll up wall     Extension  Both;10 reps    Theraband Level (Shoulder Extension)  Level 1 (Yellow)    Extension Limitations  Cues for scapular retraction     Row  Both;10 reps    Theraband Level (Shoulder Row)  Level 2 (Red)    Row Limitations  Cues for scapular retraction       Shoulder Exercises: Pulleys   Flexion   3 minutes    Flexion Limitations  to tolerance    Scaption  3 minutes    Scaption Limitations  to tolerance      Shoulder Exercises: ROM/Strengthening   Wall Pushups  10 reps   well tolerated    Wall Pushups Limitations  conservative partial pushup on wall     Other ROM/Strengthening Exercises  R shoulder rhythmic stabilization in prolonged 90 dg flexion position with hand out orange p-ball on wall and therapist purterbations x 20 sec    some fatigue noted     Shoulder Exercises: Stretch   Internal Rotation Stretch  10 seconds   x 5 reps gentle with towel     Vasopneumatic   Number Minutes Vasopneumatic   15 minutes    Vasopnuematic Location   Shoulder    Vasopneumatic Pressure  Low    Vasopneumatic Temperature   Coldest      Manual Therapy   Manual Therapy  Passive ROM    Passive ROM  Slow R shoulder PROM into flexion, abduction, IR/ER to tolerance with prolonged holds at end range - good tolerance today               PT Short Term Goals - 06/26/18 1405      PT SHORT TERM GOAL #1   Title  Patient to be independent with initial HEP.    Time  3    Period  Weeks    Status  Achieved        PT Long Term Goals - 06/26/18 1405      PT LONG TERM GOAL #1   Title  patient to be independent with advanced HEP    Time  6    Period  Weeks    Status  Partially Met   met for current     PT LONG TERM GOAL #2   Title  patient to demonstrate full and non-painful AROM (including multi-planar motions) without pain    Time  6    Period  Weeks    Status  On-going   No improvements in AROM since last measured ~1 week ago, however large improvement since last progress note; improvement in R shoulder IR PROM     PT LONG TERM GOAL #3   Title  patient to improve R shoulder strength to >/= 4+/5 without pain    Time  6    Period  Weeks    Status  On-going   able to tolerate gentle resistance, worst with ER     PT LONG TERM GOAL #4   Title  Patient to demonstrate overhead  reaching to place 5# object on shelf overhead with <=1/10 pain.    Time  6    Period  Weeks    Status  On-going   able to reach overhead with compensations in R shoulder     PT LONG TERM GOAL #5   Title  Patient to report tolerance of 1 full shift at work without pain.    Time  6    Period  Weeks    Status  On-going   patient still out of work at this time           Plan - 07/10/18 1405    Clinical Impression Statement  Margie reporting reaching activities becoming somewhat easier now at home.  Tolerated progression of elevation activities in session well today.  Progressed scapular strengthening without issue.  Most difficulty with flexion/scaption wall p-ball roll today.  Ended visit with ice/compression to R shoulder to decrease post-exercise soreness and swelling.      PT Treatment/Interventions  ADLs/Self Care Home Management;Cryotherapy;Electrical Stimulation;Moist Heat;Therapeutic exercise;Therapeutic activities;Neuromuscular re-education;Patient/family education;Manual techniques;Vasopneumatic Device;Taping;Dry needling;Passive range of motion;Ultrasound;Splinting;Energy conservation;Scar mobilization    Consulted and Agree with Plan of Care  Patient       Patient will benefit from skilled therapeutic intervention in order to improve the following deficits and impairments:  Pain, Impaired UE functional use, Decreased strength, Decreased range of motion, Decreased activity tolerance, Decreased scar mobility, Postural dysfunction, Impaired flexibility  Visit Diagnosis: Acute pain of right shoulder  Stiffness of right shoulder, not elsewhere classified  Other symptoms and signs involving the musculoskeletal system  Muscle weakness (generalized)     Problem List Patient Active Problem List   Diagnosis Date Noted  . S/P arthroscopy of right shoulder 04/17/2018    Bess Harvest, PTA 07/10/18 6:18 PM   [facility] [address]  [place], [place], [address] Phone: [phone]   Fax:  [phone]  Name: Monica Petty MRN: [id number] Date of Birth: 1966-02-03

## 2018-07-12 ENCOUNTER — Ambulatory Visit: Payer: PRIVATE HEALTH INSURANCE | Admitting: Physical Therapy

## 2018-07-12 ENCOUNTER — Encounter: Payer: Self-pay | Admitting: Physical Therapy

## 2018-07-12 DIAGNOSIS — R29898 Other symptoms and signs involving the musculoskeletal system: Secondary | ICD-10-CM

## 2018-07-12 DIAGNOSIS — M25511 Pain in right shoulder: Secondary | ICD-10-CM

## 2018-07-12 DIAGNOSIS — M25611 Stiffness of right shoulder, not elsewhere classified: Secondary | ICD-10-CM

## 2018-07-12 DIAGNOSIS — M6281 Muscle weakness (generalized): Secondary | ICD-10-CM

## 2018-07-12 NOTE — Therapy (Signed)
[facility] [address]  [place], [place], [address] Phone: [phone]   Fax:  [phone]  Physical Therapy Treatment  Patient Details  Name: Monica Petty MRN: [id number] Date of Birth: 11/26/1965 Referring Provider (PT): Sydnee Cabal, MD   Encounter Date: 07/12/2018  PT End of Session - 07/12/18 0928    Visit Number  27    Number of Visits  29    Date for PT Re-Evaluation  07/19/18    Authorization Type  WC    Authorization Time Period  6 visits pre-op; 12 visits    Authorization - Visit Number  55    Authorization - Number of Visits  36    PT Start Time  0845    PT Stop Time  0941    PT Time Calculation (min)  56 min    Activity Tolerance  Patient tolerated treatment well    Behavior During Therapy  [facility] for tasks assessed/performed       Past Medical History:  Diagnosis Date  . Anxiety   . Diverticulosis of colon   . GERD (gastroesophageal reflux disease)    takes okra pepsin supplement  . History of concussion    1987  fell of horse--- no residual  . Hypertension   . Migraine    occasional    Past Surgical History:  Procedure Laterality Date  . COLONOSCOPY  06/06/2016  . LAPAROSCOPIC CHOLECYSTECTOMY  06-03-2008   dr Ulyess Blossom  [facility]  . REDUCTION MAMMAPLASTY Bilateral 03-05-2002   dr barber  [facility]  . SHOULDER ARTHROSCOPY WITH ROTATOR CUFF REPAIR AND SUBACROMIAL DECOMPRESSION Right 04/17/2018   Procedure: Right shoulder evaluation under anesthesia, scope, debridement, subacromial decompression, rotator cuff repair;  Surgeon: Sydnee Cabal, MD;  Location: [facility];  Service: Orthopedics;  Laterality: Right;  120 mins General with Intra Scalene Block  . VAGINAL HYSTERECTOMY  02-12-2002   dr Nori Riis  [facility]  . WISDOM TOOTH EXTRACTION  teen    There were no vitals filed for this visit.  Subjective Assessment - 07/12/18 0846    Subjective  Reports she could tell she was doing some  different things after starting work this week. Had a bit of soreness.     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 Maximum retraction is 16.5 mm and the tear is 14 mm wide. Mild tendinopathy involving the infraspinatus tendon probable small intrasubstance tears. The subscapularis tendon is intact.    Patient Stated Goals  keep strengthening and mobility of R shoulder    Currently in Pain?  No/denies                       [facility] Adult PT Treatment/Exercise - 07/12/18 0001      Exercises   Exercises  Shoulder      Shoulder Exercises: Supine   Protraction  Both;Strengthening;15 reps    Protraction Limitations  4#      Shoulder Exercises: Seated   Flexion  Both;10 reps;AROM;Weights;Limitations    Flexion Weight (lbs)  1#, 2#    Flexion Limitations  6x with 1#, 4x with 2# to ~90 deg    Abduction  Both;10 reps;AROM;Weights;Limitations    ABduction Weight (lbs)  1#    ABduction Limitations  c/o popping in full abduction, resolved with slight scaption      Shoulder Exercises: Standing   Horizontal ABduction  Strengthening;Both;15 reps;Theraband;Limitations  Theraband Level (Shoulder Horizontal ABduction)  Level 1 (Yellow)    Horizontal ABduction Limitations  good form    External Rotation  --    Theraband Level (Shoulder External Rotation)  Level 2 (Red)    External Rotation Limitations  Cues for full scap. retraction provided    Row  Both;10 reps    Theraband Level (Shoulder Row)  Level 3 (Green)    Row Limitations  2x10; cues to step back for increased resistance    Other Standing Exercises  B tricep extension red TB 10x    Other Standing Exercises  B ER with yellow TB 2x10    cues to maintain elbows in     Shoulder Exercises: Pulleys   Flexion  3 minutes    Flexion Limitations  to tolerance    Scaption  3 minutes    Scaption Limitations  to tolerance      Vasopneumatic   Number Minutes  Vasopneumatic   15 minutes    Vasopnuematic Location   Shoulder   R   Vasopneumatic Pressure  Low    Vasopneumatic Temperature   Coldest      Manual Therapy   Manual Therapy  Passive ROM    Passive ROM  Slow R shoulder PROM into flexion, abduction, IR/ER to tolerance with prolonged holds at end range - mild c/o pain with end range flex, abd, ER             PT Education - 07/12/18 5686    Education provided  Yes    Education Details  update to HEP; administered green TB    Person(s) Educated  Patient    Methods  Explanation;Demonstration;Tactile cues;Verbal cues;Handout    Comprehension  Verbalized understanding;Returned demonstration       PT Short Term Goals - 06/26/18 1405      PT SHORT TERM GOAL #1   Title  Patient to be independent with initial HEP.    Time  3    Period  Weeks    Status  Achieved        PT Long Term Goals - 06/26/18 1405      PT LONG TERM GOAL #1   Title  patient to be independent with advanced HEP    Time  6    Period  Weeks    Status  Partially Met   met for current     PT LONG TERM GOAL #2   Title  patient to demonstrate full and non-painful AROM (including multi-planar motions) without pain    Time  6    Period  Weeks    Status  On-going   No improvements in AROM since last measured ~1 week ago, however large improvement since last progress note; improvement in R shoulder IR PROM     PT LONG TERM GOAL #3   Title  patient to improve R shoulder strength to >/= 4+/5 without pain    Time  6    Period  Weeks    Status  On-going   able to tolerate gentle resistance, worst with ER     PT LONG TERM GOAL #4   Title  Patient to demonstrate overhead reaching to place 5# object on shelf overhead with <=1/10 pain.    Time  6    Period  Weeks    Status  On-going   able to reach overhead with compensations in R shoulder     PT LONG TERM GOAL #5   Title  Patient  to report tolerance of 1 full shift at work without pain.    Time  6     Period  Weeks    Status  On-going   patient still out of work at this time           Plan - 07/12/18 0929    Clinical Impression Statement  Patient reporting that she had a little soreness after starting work this week. R shoulder PROM continues to improve, however still limited by mild pain at end ranges. Worked on progressive RTC and periscapular strengthening with good tolerance by patient. Able to progress flexion with 2# with cues to avoid shoulder hiking. Reporting nonpainful popping with resisted abduction with petter tolerance in slight scaption. Introduced ER stretch with strap- patient reporting weakness with elbow extension. Addressed this triceps weakness with resisted elbow extension and added this exercise to HEP. Able to progress scapular row with green TB today. Received [place] to R shoulder at end of session to ease post-exercise muscle soreness. Patient reported understanding of all edu given today and received HEP handout.     PT Treatment/Interventions  ADLs/Self Care Home Management;Cryotherapy;Electrical Stimulation;Moist Heat;Therapeutic exercise;Therapeutic activities;Neuromuscular re-education;Patient/family education;Manual techniques;Vasopneumatic Device;Taping;Dry needling;Passive range of motion;Ultrasound;Splinting;Energy conservation;Scar mobilization    Consulted and Agree with Plan of Care  Patient       Patient will benefit from skilled therapeutic intervention in order to improve the following deficits and impairments:  Pain, Impaired UE functional use, Decreased strength, Decreased range of motion, Decreased activity tolerance, Decreased scar mobility, Postural dysfunction, Impaired flexibility  Visit Diagnosis: Acute pain of right shoulder  Stiffness of right shoulder, not elsewhere classified  Other symptoms and signs involving the musculoskeletal system  Muscle weakness (generalized)     Problem List Patient Active Problem List   Diagnosis  Date Noted  . S/P arthroscopy of right shoulder 04/17/2018    Janene Harvey, PT, DPT 07/12/18 9:35 AM   [facility] [address]  [place], [place], [address] Phone: [phone]   Fax:  [phone]  Name: Monica Petty MRN: [id number] Date of Birth: 04-15-1966

## 2018-07-16 ENCOUNTER — Ambulatory Visit: Payer: PRIVATE HEALTH INSURANCE

## 2018-07-16 ENCOUNTER — Encounter

## 2018-07-16 DIAGNOSIS — M6281 Muscle weakness (generalized): Secondary | ICD-10-CM

## 2018-07-16 DIAGNOSIS — R29898 Other symptoms and signs involving the musculoskeletal system: Secondary | ICD-10-CM

## 2018-07-16 DIAGNOSIS — M25611 Stiffness of right shoulder, not elsewhere classified: Secondary | ICD-10-CM

## 2018-07-16 DIAGNOSIS — M25511 Pain in right shoulder: Secondary | ICD-10-CM | POA: Diagnosis not present

## 2018-07-16 NOTE — Therapy (Signed)
[facility] [address]  [place], [place], [address] Phone: [phone]   Fax:  [phone]  Physical Therapy Treatment  Patient Details  Name: Monica Petty MRN: [id number] Date of Birth: Aug 26, 1966 Referring Provider (PT): Sydnee Cabal, MD   Encounter Date: 07/16/2018  PT End of Session - 07/16/18 1704    Visit Number  28    Number of Visits  29    Date for PT Re-Evaluation  07/19/18    Authorization Type  WC    Authorization Time Period  6 visits pre-op; 12 visits    Authorization - Visit Number  34    Authorization - Number of Visits  36    PT Start Time  1702    PT Stop Time  1750    PT Time Calculation (min)  48 min    Activity Tolerance  Patient tolerated treatment well    Behavior During Therapy  [facility] for tasks assessed/performed       Past Medical History:  Diagnosis Date  . Anxiety   . Diverticulosis of colon   . GERD (gastroesophageal reflux disease)    takes okra pepsin supplement  . History of concussion    1987  fell of horse--- no residual  . Hypertension   . Migraine    occasional    Past Surgical History:  Procedure Laterality Date  . COLONOSCOPY  06/06/2016  . LAPAROSCOPIC CHOLECYSTECTOMY  06-03-2008   dr Ulyess Blossom  [facility]  . REDUCTION MAMMAPLASTY Bilateral 03-05-2002   dr barber  [facility]  . SHOULDER ARTHROSCOPY WITH ROTATOR CUFF REPAIR AND SUBACROMIAL DECOMPRESSION Right 04/17/2018   Procedure: Right shoulder evaluation under anesthesia, scope, debridement, subacromial decompression, rotator cuff repair;  Surgeon: Sydnee Cabal, MD;  Location: [facility];  Service: Orthopedics;  Laterality: Right;  120 mins General with Intra Scalene Block  . VAGINAL HYSTERECTOMY  02-12-2002   dr Nori Riis  [facility]  . WISDOM TOOTH EXTRACTION  teen    There were no vitals filed for this visit.  Subjective Assessment - 07/16/18 1703    Subjective  Pt. noting work going well.      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 Maximum retraction is 16.5 mm and the tear is 14 mm wide. Mild tendinopathy involving the infraspinatus tendon probable small intrasubstance tears. The subscapularis tendon is intact.    Patient Stated Goals  keep strengthening and mobility of R shoulder    Currently in Pain?  No/denies    Pain Score  0-No pain    Multiple Pain Sites  No         [facility] PT Assessment - 07/16/18 1719      Assessment   Medical Diagnosis  Traumatic rupture of RTC (R)    Referring Provider (PT)  Sydnee Cabal, MD    Onset Date/Surgical Date  04/17/18    Hand Dominance  Right    Next MD Visit  11.20,19    Prior Therapy  Yes- for shoulder      AROM   Right Shoulder Flexion  132 Degrees    Right Shoulder ABduction  118 Degrees    Right Shoulder Internal Rotation  --   FIR to just above beltline    Right Shoulder External Rotation  --   FER to T4     PROM   PROM Assessment Site  Shoulder    Right/Left Shoulder  Right    Right Shoulder Flexion  151 Degrees    Right Shoulder ABduction  142 Degrees    Right Shoulder Internal Rotation  89 Degrees    Right Shoulder External Rotation  80 Degrees   with manual pressure to anterior capsule                   [facility] Adult PT Treatment/Exercise - 07/16/18 1712      Shoulder Exercises: Supine   Other Supine Exercises  R shoulder ABC's x 2 rounds       Shoulder Exercises: Sidelying   External Rotation  Strengthening;Right;10 reps;Limitations;Weights    ABduction  Right;15 reps;Strengthening      Shoulder Exercises: Standing   External Rotation  20 reps;Right    Theraband Level (Shoulder External Rotation)  Level 2 (Red)    Internal Rotation  Right;15 reps;Strengthening    Theraband Level (Shoulder Internal Rotation)  Level 2 (Red)    Flexion  Both      Shoulder Exercises: ROM/Strengthening   UBE (Upper Arm Bike)  Lvl 1.0, 2 min  forwards/back   tolerated well however noted R UE fatigue after    Wall Wash  arcs in full elevation on wall x 10 reps       Shoulder Exercises: IT sales professional  2 reps;30 seconds;Limitations    Corner Stretch Limitations  low and mid pec stretch    Internal Rotation Stretch  --   5" x 10 reps with towel      Manual Therapy   Manual Therapy  Passive ROM    Passive ROM  Slow R shoulder PROM into flexion, abduction, IR/ER to tolerance with prolonged holds at end range                PT Short Term Goals - 06/26/18 1405      PT SHORT TERM GOAL #1   Title  Patient to be independent with initial HEP.    Time  3    Period  Weeks    Status  Achieved        PT Long Term Goals - 06/26/18 1405      PT LONG TERM GOAL #1   Title  patient to be independent with advanced HEP    Time  6    Period  Weeks    Status  Partially Met   met for current     PT LONG TERM GOAL #2   Title  patient to demonstrate full and non-painful AROM (including multi-planar motions) without pain    Time  6    Period  Weeks    Status  On-going   No improvements in AROM since last measured ~1 week ago, however large improvement since last progress note; improvement in R shoulder IR PROM     PT LONG TERM GOAL #3   Title  patient to improve R shoulder strength to >/= 4+/5 without pain    Time  6    Period  Weeks    Status  On-going   able to tolerate gentle resistance, worst with ER     PT LONG TERM GOAL #4   Title  Patient to demonstrate overhead reaching to place 5# object on shelf overhead with <=1/10 pain.    Time  6    Period  Weeks    Status  On-going   able to reach overhead with compensations in R shoulder     PT LONG TERM GOAL #  5   Title  Patient to report tolerance of 1 full shift at work without pain.    Time  6    Period  Weeks    Status  On-going   patient still out of work at this time           Plan - 07/16/18 1708    Clinical Impression Statement  Leilani  notes work going well.  Still having difficulty/mild pain with reaching behind back and overhead.  Able to demo improvement in nearly all motions with PROM/AROM today.  Progressed RTC/scapular strengthening therex without issue.  Ended visit with pt. noting R shoulder fatigue however declining ice as she needs to leave for her 70th wedding anniversary.  Progressing well toward goals with next visit last visit in current POC.      PT Treatment/Interventions  ADLs/Self Care Home Management;Cryotherapy;Electrical Stimulation;Moist Heat;Therapeutic exercise;Therapeutic activities;Neuromuscular re-education;Patient/family education;Manual techniques;Vasopneumatic Device;Taping;Dry needling;Passive range of motion;Ultrasound;Splinting;Energy conservation;Scar mobilization    Consulted and Agree with Plan of Care  Patient       Patient will benefit from skilled therapeutic intervention in order to improve the following deficits and impairments:  Pain, Impaired UE functional use, Decreased strength, Decreased range of motion, Decreased activity tolerance, Decreased scar mobility, Postural dysfunction, Impaired flexibility  Visit Diagnosis: Acute pain of right shoulder  Stiffness of right shoulder, not elsewhere classified  Other symptoms and signs involving the musculoskeletal system  Muscle weakness (generalized)     Problem List Patient Active Problem List   Diagnosis Date Noted  . S/P arthroscopy of right shoulder 04/17/2018   Bess Harvest, PTA 07/16/18 6:10 PM    [facility] [address]  [place], [place], [address] Phone: [phone]   Fax:  [phone]  Name: MIAYA LAFONTANT MRN: [id number] Date of Birth: [birth year]

## 2018-07-17 ENCOUNTER — Ambulatory Visit: Payer: PRIVATE HEALTH INSURANCE

## 2018-07-19 ENCOUNTER — Encounter: Payer: Self-pay | Admitting: Physical Therapy

## 2018-07-19 ENCOUNTER — Ambulatory Visit: Payer: PRIVATE HEALTH INSURANCE | Admitting: Physical Therapy

## 2018-07-19 DIAGNOSIS — M25611 Stiffness of right shoulder, not elsewhere classified: Secondary | ICD-10-CM

## 2018-07-19 DIAGNOSIS — M6281 Muscle weakness (generalized): Secondary | ICD-10-CM

## 2018-07-19 DIAGNOSIS — M25511 Pain in right shoulder: Secondary | ICD-10-CM | POA: Diagnosis not present

## 2018-07-19 DIAGNOSIS — R29898 Other symptoms and signs involving the musculoskeletal system: Secondary | ICD-10-CM

## 2018-07-19 NOTE — Therapy (Signed)
[facility] [address]  [place], [place], [address] Phone: [phone]   Fax:  [phone]  Physical Therapy Treatment  Patient Details  Name: Monica Petty MRN: [id number] Date of Birth: [birth year] Referring Provider (PT): Sydnee Cabal, MD   Encounter Date: 07/19/2018  PT End of Session - 07/19/18 1108    Visit Number  29    Number of Visits  74    Date for PT Re-Evaluation  09/06/18    Authorization Type  WC    Authorization Time Period  6 visits pre-op; 12 visits    Authorization - Visit Number  86    Authorization - Number of Visits  36    PT Start Time  0800    PT Stop Time  0845    PT Time Calculation (min)  45 min    Activity Tolerance  Patient tolerated treatment well;Patient limited by pain    Behavior During Therapy  [facility] for tasks assessed/performed       Past Medical History:  Diagnosis Date  . Anxiety   . Diverticulosis of colon   . GERD (gastroesophageal reflux disease)    takes okra pepsin supplement  . History of concussion    1987  fell of horse--- no residual  . Hypertension   . Migraine    occasional    Past Surgical History:  Procedure Laterality Date  . COLONOSCOPY  06/06/2016  . LAPAROSCOPIC CHOLECYSTECTOMY  06-03-2008   dr Ulyess Blossom  [facility]  . REDUCTION MAMMAPLASTY Bilateral 03-05-2002   dr barber  [facility]  . SHOULDER ARTHROSCOPY WITH ROTATOR CUFF REPAIR AND SUBACROMIAL DECOMPRESSION Right 04/17/2018   Procedure: Right shoulder evaluation under anesthesia, scope, debridement, subacromial decompression, rotator cuff repair;  Surgeon: Sydnee Cabal, MD;  Location: [facility];  Service: Orthopedics;  Laterality: Right;  120 mins General with Intra Scalene Block  . VAGINAL HYSTERECTOMY  02-12-2002   dr Nori Riis  [facility]  . WISDOM TOOTH EXTRACTION  teen    There were no vitals filed for this visit.  Subjective Assessment - 07/19/18 0754    Subjective  Reports she is  doing well. Reports 65% improvement since initial eval. Would like to continue working on reaching back, ROM, strength.     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 Maximum retraction is 16.5 mm and the tear is 14 mm wide. Mild tendinopathy involving the infraspinatus tendon probable small intrasubstance tears. The subscapularis tendon is intact.    Patient Stated Goals  keep strengthening and mobility of R shoulder    Currently in Pain?  No/denies         [facility] PT Assessment - 07/19/18 0001      Assessment   Medical Diagnosis  Traumatic rupture of RTC (R)    Referring Provider (PT)  Sydnee Cabal, MD    Onset Date/Surgical Date  04/17/18      AROM   Right/Left Shoulder  Right    Right Shoulder Flexion  135 Degrees    Right Shoulder ABduction  90 Degrees    Right Shoulder Internal Rotation  58 Degrees    Right Shoulder External Rotation  75 Degrees      PROM   PROM Assessment Site  Shoulder    Right/Left Shoulder  Right    Right Shoulder Flexion  141 Degrees    Right Shoulder ABduction  140 Degrees    Right  Shoulder Internal Rotation  82 Degrees    Right Shoulder External Rotation  81 Degrees      Strength   Right/Left Shoulder  Right    Right Shoulder Flexion  4+/5    Right Shoulder ABduction  4/5    Right Shoulder Internal Rotation  4/5    Right Shoulder External Rotation  4-/5                   [facility] Adult PT Treatment/Exercise - 07/19/18 0001      Exercises   Exercises  Shoulder      Shoulder Exercises: Seated   Abduction  Both;AROM;Weights;Limitations;15 reps    ABduction Weight (lbs)  1# 10x; 2# 5x      Shoulder Exercises: Prone   Other Prone Exercises  R prone Y over green pball 10x    Other Prone Exercises  R prone extension over green pball x10 with focus on ROM       Shoulder Exercises: Sidelying   External Rotation  Strengthening;Right;10 reps;Limitations;Weights     External Rotation Weight (lbs)  2    External Rotation Limitations  dowel under elbow to maintain neutral    ABduction  Strengthening;Right;10 reps;Weights;Limitations    ABduction Weight (lbs)  2    ABduction Limitations  cues for slight scap retraction to avoid popping      Shoulder Exercises: Standing   Flexion  Right;Strengthening;10 reps;Weights;Limitations    Shoulder Flexion Weight (lbs)  1,2    Flexion Limitations  reaching overhead to overhead cabinet with good scapulohumeral rhythm      Shoulder Exercises: Pulleys   Flexion  3 minutes    Flexion Limitations  to tolerance    Scaption  3 minutes    Scaption Limitations  to tolerance      Shoulder Exercises: Stretch   Other Shoulder Stretches  R shoulder IR/ER apley stretch 10x5" to tolerance with strap      Manual Therapy   Manual Therapy  Passive ROM    Passive ROM  Slow R shoulder PROM into flexion, abduction, IR/ER to tolerance with prolonged holds at end range    limited by pain at end range            PT Education - 07/19/18 1107    Education provided  Yes    Education Details  update to [employer]) Educated  Patient    Methods  Explanation;Demonstration;Tactile cues;Verbal cues;Handout    Comprehension  Verbalized understanding;Returned demonstration       PT Short Term Goals - 07/19/18 0805      PT SHORT TERM GOAL #1   Title  Patient to be independent with initial HEP.    Time  3    Period  Weeks    Status  Achieved        PT Long Term Goals - 07/19/18 0805      PT LONG TERM GOAL #1   Title  patient to be independent with advanced HEP    Time  7    Period  Weeks    Status  Partially Met   met for current   Target Date  09/06/18      PT LONG TERM GOAL #2   Title  patient to demonstrate full and non-painful AROM (including multi-planar motions) without pain    Time  7    Period  Weeks    Status  On-going   improvements demonstrated in R shoulder flexion, abduction, and  ER AROM and  abduction and ER PROM   Target Date  09/06/18      PT LONG TERM GOAL #3   Title  patient to improve R shoulder strength to >/= 4+/5 without pain    Time  6    Period  Weeks    Status  Partially Met   good improvements in all planes of strength testing     PT LONG TERM GOAL #4   Title  Patient to demonstrate overhead reaching to place 5# object on shelf overhead with <=1/10 pain.    Time  6    Period  Weeks    Status  Partially Met   able to reach to overhead shelf with 2lbs with no compensations and good scapulohumeral rhythm     PT LONG TERM GOAL #5   Title  Patient to report tolerance of 1 full shift at work without pain.    Time  6    Period  Weeks    Status  Partially Met   reports no increased R shoulder pain with light duty     Additional Long Term Goals   Additional Long Term Goals  Yes      PT LONG TERM GOAL #6   Title  Patient to report tolerance of fastening bra without pain.     Time  7    Period  Weeks    Status  New    Target Date  09/06/18            Plan - 07/19/18 1119    Clinical Impression Statement  Patient arrived to session with report of 65% improvement since initial eval. Has returned to work and is going good ROM and strength improvements. Reports she would like to continue working on reaching back, ROM, strength, and fastening bra. Updated goals- improvements demonstrated in R shoulder flexion, abduction, and ER AROM and abduction and ER PROM. Good improvements in all planes of strength testing demonstrated today. Patient able to reach to overhead shelf with 2lbs with no compensations and good scapulohumeral rhythm. Patient has returned to work on light duty and reports no shoulder pain with those tasks. Updated goal to include patient's goal of fastening bra with no pain. Patient today with good tolerance of all activities- still limited by mild pain with PROM. Updated HEP to include standing abduction with increased weighted resistance and ER  stretch. Patient reported understanding. No c/o pain at end of session. Patient showing great progress with PT thus far- will benefit from additional skilled PT services 1x/week for 7 weeks to return to PLOF.     PT Frequency  1x / week    PT Duration  --   7 weeks   PT Treatment/Interventions  ADLs/Self Care Home Management;Cryotherapy;Electrical Stimulation;Moist Heat;Therapeutic exercise;Therapeutic activities;Neuromuscular re-education;Patient/family education;Manual techniques;Vasopneumatic Device;Taping;Dry needling;Passive range of motion;Ultrasound;Splinting;Energy conservation;Scar mobilization    Consulted and Agree with Plan of Care  Patient       Patient will benefit from skilled therapeutic intervention in order to improve the following deficits and impairments:  Pain, Impaired UE functional use, Decreased strength, Decreased range of motion, Decreased activity tolerance, Decreased scar mobility, Postural dysfunction, Impaired flexibility  Visit Diagnosis: Acute pain of right shoulder  Stiffness of right shoulder, not elsewhere classified  Other symptoms and signs involving the musculoskeletal system  Muscle weakness (generalized)     Problem List Patient Active Problem List   Diagnosis Date Noted  . S/P arthroscopy of right shoulder 04/17/2018  Janene Harvey, PT, DPT 07/19/18 11:24 AM   [facility] [address]  [place], [place], [address] Phone: [phone]   Fax:  [phone]  Name: YULIANNA FOLSE MRN: [id number] Date of Birth: [birth year]

## 2018-07-23 ENCOUNTER — Ambulatory Visit: Payer: PRIVATE HEALTH INSURANCE | Attending: Nurse Practitioner | Admitting: Physical Therapy

## 2018-07-23 ENCOUNTER — Encounter: Payer: Self-pay | Admitting: Physical Therapy

## 2018-07-23 DIAGNOSIS — R29898 Other symptoms and signs involving the musculoskeletal system: Secondary | ICD-10-CM | POA: Insufficient documentation

## 2018-07-23 DIAGNOSIS — M6281 Muscle weakness (generalized): Secondary | ICD-10-CM | POA: Diagnosis present

## 2018-07-23 DIAGNOSIS — M25511 Pain in right shoulder: Secondary | ICD-10-CM | POA: Diagnosis present

## 2018-07-23 DIAGNOSIS — M25611 Stiffness of right shoulder, not elsewhere classified: Secondary | ICD-10-CM | POA: Insufficient documentation

## 2018-07-23 NOTE — Therapy (Signed)
[facility] [address]  [place], [place], [address] Phone: [phone]   Fax:  [phone]  Physical Therapy Treatment  Patient Details  Name: Monica Petty MRN: [id number] Date of Birth: Jul 25, 1966 Referring Provider (PT): Sydnee Cabal, MD   Encounter Date: 07/23/2018  PT End of Session - 07/23/18 1708    Visit Number  30    Number of Visits  36    Date for PT Re-Evaluation  09/06/18    Authorization Type  WC    Authorization Time Period  6 visits pre-op; 12 visits    Authorization - Visit Number  73    Authorization - Number of Visits  36    PT Start Time  1446    PT Stop Time  1539    PT Time Calculation (min)  53 min    Activity Tolerance  Patient tolerated treatment well    Behavior During Therapy  [facility] for tasks assessed/performed       Past Medical History:  Diagnosis Date  . Anxiety   . Diverticulosis of colon   . GERD (gastroesophageal reflux disease)    takes okra pepsin supplement  . History of concussion    1987  fell of horse--- no residual  . Hypertension   . Migraine    occasional    Past Surgical History:  Procedure Laterality Date  . COLONOSCOPY  06/06/2016  . LAPAROSCOPIC CHOLECYSTECTOMY  06-03-2008   dr Ulyess Blossom  [facility]  . REDUCTION MAMMAPLASTY Bilateral 03-05-2002   dr barber  [facility]  . SHOULDER ARTHROSCOPY WITH ROTATOR CUFF REPAIR AND SUBACROMIAL DECOMPRESSION Right 04/17/2018   Procedure: Right shoulder evaluation under anesthesia, scope, debridement, subacromial decompression, rotator cuff repair;  Surgeon: Sydnee Cabal, MD;  Location: [facility];  Service: Orthopedics;  Laterality: Right;  120 mins General with Intra Scalene Block  . VAGINAL HYSTERECTOMY  02-12-2002   dr Nori Riis  [facility]  . WISDOM TOOTH EXTRACTION  teen    There were no vitals filed for this visit.  Subjective Assessment - 07/23/18 1448    Subjective  Reports shoulder is feeling good.      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 Maximum retraction is 16.5 mm and the tear is 14 mm wide. Mild tendinopathy involving the infraspinatus tendon probable small intrasubstance tears. The subscapularis tendon is intact.    Patient Stated Goals  keep strengthening and mobility of R shoulder    Currently in Pain?  No/denies                       [facility] Adult PT Treatment/Exercise - 07/23/18 0001      Exercises   Exercises  Shoulder      Shoulder Exercises: Prone   Other Prone Exercises  R UE prone I, T, Y over orange pball with cues for scap upoward rotation and retraction    Other Prone Exercises  R UE prone row 5# 10x over orange pball      Shoulder Exercises: Standing   External Rotation  Strengthening;Both;Theraband    Theraband Level (Shoulder External Rotation)  Level 1 (Yellow);Level 2 (Red)    External Rotation Weight (lbs)  5x yellow; 10x red TB cues for supination    Other Standing Exercises  R UE extension AAROM with cane to tolerance x10      Shoulder Exercises: Pulleys   Flexion  3 minutes    Flexion Limitations  to tolerance    Scaption  3 minutes   nonpainful popping   Scaption Limitations  to tolerance      Shoulder Exercises: ROM/Strengthening   Cybex Row  10 reps;Limitations    Cybex Row Limitations  B UE BATCA row 15# with cues for scap retraction    Other ROM/Strengthening Exercises  BATCA serratus punch B UEs 10x 15#    Other ROM/Strengthening Exercises  straight arm pulldown 10x 5#      Shoulder Exercises: Stretch   Corner Stretch  2 reps;Limitations;20 seconds    Corner Stretch Limitations  90/90 doorway pec stretch on R UE to tolerance      Vasopneumatic   Number Minutes Vasopneumatic   15 minutes    Vasopnuematic Location   Shoulder   R   Vasopneumatic Pressure  Low    Vasopneumatic Temperature   Coldest             PT Education - 07/23/18 1708     Education provided  Yes    Education Details  update to HEP and correction of weight with standing abduction, review and consolidation of HEP    Person(s) Educated  Patient    Methods  Explanation;Demonstration;Tactile cues;Verbal cues;Handout    Comprehension  Verbalized understanding;Returned demonstration       PT Short Term Goals - 07/19/18 0805      PT SHORT TERM GOAL #1   Title  Patient to be independent with initial HEP.    Time  3    Period  Weeks    Status  Achieved        PT Long Term Goals - 07/19/18 0805      PT LONG TERM GOAL #1   Title  patient to be independent with advanced HEP    Time  7    Period  Weeks    Status  Partially Met   met for current   Target Date  09/06/18      PT LONG TERM GOAL #2   Title  patient to demonstrate full and non-painful AROM (including multi-planar motions) without pain    Time  7    Period  Weeks    Status  On-going   improvements demonstrated in R shoulder flexion, abduction, and ER AROM and abduction and ER PROM   Target Date  09/06/18      PT LONG TERM GOAL #3   Title  patient to improve R shoulder strength to >/= 4+/5 without pain    Time  6    Period  Weeks    Status  Partially Met   good improvements in all planes of strength testing     PT LONG TERM GOAL #4   Title  Patient to demonstrate overhead reaching to place 5# object on shelf overhead with <=1/10 pain.    Time  6    Period  Weeks    Status  Partially Met   able to reach to overhead shelf with 2lbs with no compensations and good scapulohumeral rhythm     PT LONG TERM GOAL #5   Title  Patient to report tolerance of 1 full shift at work without pain.    Time  6    Period  Weeks    Status  Partially Met   reports no increased R shoulder pain with light duty     Additional Long Term Goals   Additional Long Term Goals  Yes  PT LONG TERM GOAL #6   Title  Patient to report tolerance of fastening bra without pain.     Time  7    Period  Weeks     Status  New    Target Date  09/06/18            Plan - 07/23/18 1712    Clinical Impression Statement  Patient with no new complaints today. Tolerated gentle PROM to R shoulder with mild pain at end range. Worked on progressive RTC and periscapular strengthening today with intermittent cues to correct alignment and form. Report of cervical straining with prone over physioball exercises with better tolerance after cues to relax neck. Cues required to promote scapular retraction with prone I, T, Y's. Introduced Community education officer today with patient reporting no pain. Updated and consolidated HEP and corrected typo on previous session's HEP to indicate 2lbs with standing abduction rather than 5lbs. Ended session with [employer] to R shoulder. No complaints at end of session. Patient progressing per POC. Progress note not done today as full re-assessment done last session.    PT Treatment/Interventions  ADLs/Self Care Home Management;Cryotherapy;Electrical Stimulation;Moist Heat;Therapeutic exercise;Therapeutic activities;Neuromuscular re-education;Patient/family education;Manual techniques;Vasopneumatic Device;Taping;Dry needling;Passive range of motion;Ultrasound;Splinting;Energy conservation;Scar mobilization    Consulted and Agree with Plan of Care  Patient       Patient will benefit from skilled therapeutic intervention in order to improve the following deficits and impairments:  Pain, Impaired UE functional use, Decreased strength, Decreased range of motion, Decreased activity tolerance, Decreased scar mobility, Postural dysfunction, Impaired flexibility  Visit Diagnosis: Acute pain of right shoulder  Stiffness of right shoulder, not elsewhere classified  Other symptoms and signs involving the musculoskeletal system  Muscle weakness (generalized)     Problem List Patient Active Problem List   Diagnosis Date Noted  . S/P arthroscopy of right shoulder 04/17/2018     Janene Harvey, PT, DPT 07/23/18 5:19 PM   [facility] [address]  [place], [place], [address] Phone: [phone]   Fax:  [phone]  Name: TAQUANNA BORRAS MRN: [id number] Date of Birth: [birth year]

## 2018-07-24 DIAGNOSIS — L814 Other melanin hyperpigmentation: Secondary | ICD-10-CM | POA: Diagnosis not present

## 2018-07-24 DIAGNOSIS — L821 Other seborrheic keratosis: Secondary | ICD-10-CM | POA: Diagnosis not present

## 2018-07-24 DIAGNOSIS — D485 Neoplasm of uncertain behavior of skin: Secondary | ICD-10-CM | POA: Diagnosis not present

## 2018-07-24 DIAGNOSIS — I781 Nevus, non-neoplastic: Secondary | ICD-10-CM | POA: Diagnosis not present

## 2018-07-26 ENCOUNTER — Ambulatory Visit: Payer: PRIVATE HEALTH INSURANCE | Admitting: Physical Therapy

## 2018-07-30 ENCOUNTER — Ambulatory Visit: Payer: PRIVATE HEALTH INSURANCE | Admitting: Physical Therapy

## 2018-07-30 ENCOUNTER — Encounter: Payer: Self-pay | Admitting: Physical Therapy

## 2018-07-30 DIAGNOSIS — M25511 Pain in right shoulder: Secondary | ICD-10-CM | POA: Diagnosis not present

## 2018-07-30 DIAGNOSIS — R29898 Other symptoms and signs involving the musculoskeletal system: Secondary | ICD-10-CM

## 2018-07-30 DIAGNOSIS — M6281 Muscle weakness (generalized): Secondary | ICD-10-CM

## 2018-07-30 DIAGNOSIS — M25611 Stiffness of right shoulder, not elsewhere classified: Secondary | ICD-10-CM

## 2018-07-30 NOTE — Therapy (Signed)
[facility] [address]  [place], [place], [address] Phone: [phone]   Fax:  [phone]  Physical Therapy Treatment  Patient Details  Name: Monica Petty MRN: [id number] Date of Birth: 1966/03/16 Referring Provider (PT): Sydnee Cabal, MD   Encounter Date: 07/30/2018  PT End of Session - 07/30/18 1702    Visit Number  31    Number of Visits  48    Date for PT Re-Evaluation  09/06/18    Authorization Type  WC    Authorization Time Period  6 visits pre-op; 12 visits    Authorization - Visit Number  44    Authorization - Number of Visits  36    PT Start Time  1612    PT Stop Time  1709    PT Time Calculation (min)  57 min    Activity Tolerance  Patient tolerated treatment well    Behavior During Therapy  [facility] for tasks assessed/performed       Past Medical History:  Diagnosis Date  . Anxiety   . Diverticulosis of colon   . GERD (gastroesophageal reflux disease)    takes okra pepsin supplement  . History of concussion    1987  fell of horse--- no residual  . Hypertension   . Migraine    occasional    Past Surgical History:  Procedure Laterality Date  . COLONOSCOPY  06/06/2016  . LAPAROSCOPIC CHOLECYSTECTOMY  06-03-2008   dr Ulyess Blossom  [facility]  . REDUCTION MAMMAPLASTY Bilateral 03-05-2002   dr barber  [facility]  . SHOULDER ARTHROSCOPY WITH ROTATOR CUFF REPAIR AND SUBACROMIAL DECOMPRESSION Right 04/17/2018   Procedure: Right shoulder evaluation under anesthesia, scope, debridement, subacromial decompression, rotator cuff repair;  Surgeon: Sydnee Cabal, MD;  Location: [facility];  Service: Orthopedics;  Laterality: Right;  120 mins General with Intra Scalene Block  . VAGINAL HYSTERECTOMY  02-12-2002   dr Nori Riis  [facility]  . WISDOM TOOTH EXTRACTION  teen    There were no vitals filed for this visit.  Subjective Assessment - 07/30/18 1613    Subjective  Patient reports she is doing fine. Not  having pain throughout the day at work.     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 Maximum retraction is 16.5 mm and the tear is 14 mm wide. Mild tendinopathy involving the infraspinatus tendon probable small intrasubstance tears. The subscapularis tendon is intact.    Patient Stated Goals  keep strengthening and mobility of R shoulder    Currently in Pain?  No/denies                       [facility] Adult PT Treatment/Exercise - 07/30/18 0001      Shoulder Exercises: Standing   External Rotation  Strengthening;Both;Theraband   cues for form   Theraband Level (Shoulder External Rotation)  Level 2 (Red)    External Rotation Weight (lbs)  2x10    Internal Rotation  Strengthening;Right;10 reps;Theraband;Limitations   cues for form   Theraband Level (Shoulder Internal Rotation)  Level 3 (Green)    Internal Rotation Limitations  2x10; dowel under elbow for neutral positioning    Row  Strengthening;Both;10 reps;Theraband   c/o nonpainful pop in back   Theraband Level (Shoulder Row)  Level 4 (Blue)    Row Limitations  2x10    Other Standing Exercises  R UE extension AAROM with cane  to tolerance 10x5"    Other Standing Exercises  wall push up to tolerance x 10   hands shoulder width apart     Shoulder Exercises: Pulleys   Flexion  3 minutes    Flexion Limitations  to tolerance    Scaption  3 minutes    Scaption Limitations  to tolerance      Shoulder Exercises: ROM/Strengthening   Other ROM/Strengthening Exercises  BATCA serratus punch B UEs 2x10 15#      Shoulder Exercises: Stretch   Other Shoulder Stretches  R shoulder IR/ER apley stretch 15x5" to tolerance with strap   mild c/o discomfort at end ranges     Vasopneumatic   Number Minutes Vasopneumatic   15 minutes    Vasopnuematic Location   Shoulder   R   Vasopneumatic Pressure  Low    Vasopneumatic Temperature   Coldest      Manual Therapy    Manual Therapy  Passive ROM    Joint Mobilization  R shoulder distraction and posterior glide grade III to tolerance    Passive ROM  Slow R shoulder PROM into flexion, abduction, IR/ER to tolerance with prolonged holds at end range; additional time spent on R shoulder IR and extension PROM to tolerance   mild tenderness with end range flexion and abduction   Muscle Energy Technique  R shoudler ER MET for increased IR              PT Education - 07/30/18 1701    Education provided  Yes    Education Details  Administered blue TB for row, advisted to increase to green resistance with IR    Person(s) Educated  Patient    Methods  Explanation    Comprehension  Verbalized understanding;Returned demonstration       PT Short Term Goals - 07/19/18 0805      PT SHORT TERM GOAL #1   Title  Patient to be independent with initial HEP.    Time  3    Period  Weeks    Status  Achieved        PT Long Term Goals - 07/19/18 0805      PT LONG TERM GOAL #1   Title  patient to be independent with advanced HEP    Time  7    Period  Weeks    Status  Partially Met   met for current   Target Date  09/06/18      PT LONG TERM GOAL #2   Title  patient to demonstrate full and non-painful AROM (including multi-planar motions) without pain    Time  7    Period  Weeks    Status  On-going   improvements demonstrated in R shoulder flexion, abduction, and ER AROM and abduction and ER PROM   Target Date  09/06/18      PT LONG TERM GOAL #3   Title  patient to improve R shoulder strength to >/= 4+/5 without pain    Time  6    Period  Weeks    Status  Partially Met   good improvements in all planes of strength testing     PT LONG TERM GOAL #4   Title  Patient to demonstrate overhead reaching to place 5# object on shelf overhead with <=1/10 pain.    Time  6    Period  Weeks    Status  Partially Met   able to reach to overhead shelf with 2lbs with  no compensations and good scapulohumeral rhythm      PT LONG TERM GOAL #5   Title  Patient to report tolerance of 1 full shift at work without pain.    Time  6    Period  Weeks    Status  Partially Met   reports no increased R shoulder pain with light duty     Additional Long Term Goals   Additional Long Term Goals  Yes      PT LONG TERM GOAL #6   Title  Patient to report tolerance of fastening bra without pain.     Time  7    Period  Weeks    Status  New    Target Date  09/06/18            Plan - 07/30/18 1702    Clinical Impression Statement  Patient reporting no new complaints. Tolerated R shoulder PROM with mild discomfort at end range flexion and abduction. Special attention given to improve extension and IR in order to improve IR "behind-the-back" stretch at patient reports she is still unable to don/doff bra in this way. Patient reporting mild improvement in ROM with IR stretch with strap after manual therapy. Increased banded resistance with IR and row today as patient tolerating current resistance well. Administered blue TB and advised patient to increase resistance with HEP exercises accordingly. Patient reported understanding. Received Gameready to R shoulder at end of session to ease post-exercise muscle soreness. No complaints at end of session.     PT Treatment/Interventions  ADLs/Self Care Home Management;Cryotherapy;Electrical Stimulation;Moist Heat;Therapeutic exercise;Therapeutic activities;Neuromuscular re-education;Patient/family education;Manual techniques;Vasopneumatic Device;Taping;Dry needling;Passive range of motion;Ultrasound;Splinting;Energy conservation;Scar mobilization    Consulted and Agree with Plan of Care  Patient       Patient will benefit from skilled therapeutic intervention in order to improve the following deficits and impairments:  Pain, Impaired UE functional use, Decreased strength, Decreased range of motion, Decreased activity tolerance, Decreased scar mobility, Postural dysfunction,  Impaired flexibility  Visit Diagnosis: Acute pain of right shoulder  Stiffness of right shoulder, not elsewhere classified  Other symptoms and signs involving the musculoskeletal system  Muscle weakness (generalized)     Problem List Patient Active Problem List   Diagnosis Date Noted  . S/P arthroscopy of right shoulder 04/17/2018     Janene Harvey, PT, DPT 07/30/18 5:13 PM   [facility] [address]  [place], [place], [address] Phone: [phone]   Fax:  [phone]  Name: Monica Petty MRN: [id number] Date of Birth: Jun 06, 1966

## 2018-08-02 ENCOUNTER — Ambulatory Visit: Payer: PRIVATE HEALTH INSURANCE | Admitting: Physical Therapy

## 2018-08-06 ENCOUNTER — Ambulatory Visit: Payer: PRIVATE HEALTH INSURANCE | Admitting: Physical Therapy

## 2018-08-06 ENCOUNTER — Encounter: Payer: Self-pay | Admitting: Physical Therapy

## 2018-08-06 DIAGNOSIS — M25511 Pain in right shoulder: Secondary | ICD-10-CM | POA: Diagnosis not present

## 2018-08-06 DIAGNOSIS — M25611 Stiffness of right shoulder, not elsewhere classified: Secondary | ICD-10-CM

## 2018-08-06 DIAGNOSIS — M6281 Muscle weakness (generalized): Secondary | ICD-10-CM

## 2018-08-06 DIAGNOSIS — R29898 Other symptoms and signs involving the musculoskeletal system: Secondary | ICD-10-CM

## 2018-08-06 NOTE — Therapy (Signed)
[facility] [address]  [place], [place], [address] Phone: [phone]   Fax:  [phone]  Physical Therapy Treatment  Patient Details  Name: Monica Petty MRN: [id number] Date of Birth: [birth year] Referring Provider (PT): Sydnee Cabal, MD   Encounter Date: 08/06/2018  PT End of Session - 08/06/18 1706    Visit Number  32    Number of Visits  36    Date for PT Re-Evaluation  09/06/18    Authorization Type  WC    Authorization Time Period  6 visits pre-op; 12 visits    Authorization - Visit Number  63    Authorization - Number of Visits  30    PT Start Time  1615    PT Stop Time  1707    PT Time Calculation (min)  52 min    Activity Tolerance  Patient tolerated treatment well    Behavior During Therapy  WFL for tasks assessed/performed       Past Medical History:  Diagnosis Date  . Anxiety   . Diverticulosis of colon   . GERD (gastroesophageal reflux disease)    takes okra pepsin supplement  . History of concussion    1987  fell of horse--- no residual  . Hypertension   . Migraine    occasional    Past Surgical History:  Procedure Laterality Date  . COLONOSCOPY  06/06/2016  . LAPAROSCOPIC CHOLECYSTECTOMY  06-03-2008   dr Ulyess Blossom  [facility]  . REDUCTION MAMMAPLASTY Bilateral 03-05-2002   dr barber  [facility]  . SHOULDER ARTHROSCOPY WITH ROTATOR CUFF REPAIR AND SUBACROMIAL DECOMPRESSION Right 04/17/2018   Procedure: Right shoulder evaluation under anesthesia, scope, debridement, subacromial decompression, rotator cuff repair;  Surgeon: Sydnee Cabal, MD;  Location: [facility];  Service: Orthopedics;  Laterality: Right;  120 mins General with Intra Scalene Block  . VAGINAL HYSTERECTOMY  02-12-2002   dr Nori Riis  [facility]  . WISDOM TOOTH EXTRACTION  teen    There were no vitals filed for this visit.  Subjective Assessment - 08/06/18 1616    Subjective  Reports work went well today. Reports 80%  improvement since initial eval. Notes improvements in ROM, lifting, and returning to light duty work.     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 Maximum retraction is 16.5 mm and the tear is 14 mm wide. Mild tendinopathy involving the infraspinatus tendon probable small intrasubstance tears. The subscapularis tendon is intact.    Patient Stated Goals  keep strengthening and mobility of R shoulder    Currently in Pain?  No/denies         [facility] PT Assessment - 08/06/18 0001      AROM   Right/Left Shoulder  Right    Right Shoulder Flexion  140 Degrees    Right Shoulder ABduction  115 Degrees    Right Shoulder Internal Rotation  57 Degrees    Right Shoulder External Rotation  70 Degrees      PROM   PROM Assessment Site  Shoulder    Right/Left Shoulder  Right    Right Shoulder Flexion  160 Degrees    Right Shoulder ABduction  151 Degrees    Right Shoulder Internal Rotation  103 Degrees    Right Shoulder External Rotation  74 Degrees      Strength   Right/Left Shoulder  Right    Right Shoulder Flexion  4+/5  Right Shoulder ABduction  4+/5    Right Shoulder Internal Rotation  4+/5    Right Shoulder External Rotation  4/5                   [facility] Adult PT Treatment/Exercise - 08/06/18 0001      Shoulder Exercises: Standing   Flexion  Strengthening;Right;15 reps    Shoulder Flexion Weight (lbs)  3    Flexion Limitations  reaching overhead to overhead cabinet with no compensations and good form    Other Standing Exercises  R shoulder flexion D2 pattern with yellow TB    Other Standing Exercises  wall push up with hands shoulder width apart to tolerance x 15      Shoulder Exercises: Pulleys   Flexion  3 minutes    Flexion Limitations  to tolerance    Scaption  3 minutes    Scaption Limitations  to tolerance      Shoulder Exercises: Stretch   Other Shoulder Stretches  R shoulder IR apley  stretch 10x3" to tolerance with strap      Vasopneumatic   Number Minutes Vasopneumatic   15 minutes    Vasopnuematic Location   Shoulder   R   Vasopneumatic Pressure  Low    Vasopneumatic Temperature   Coldest      Manual Therapy   Manual Therapy  Passive ROM    Joint Mobilization  R shoulder distraction and posterior glide grade III to tolerance    Passive ROM  Slow R shoulder PROM into flexion, abduction, IR/ER to tolerance with prolonged holds at end range; additional time spent on R shoulder IR and extension PROM to tolerance             PT Education - 08/06/18 1705    Education provided  Yes    Education Details  advised to use 3lb weight for R shoulder flexion as tolerated    Person(s) Educated  Patient    Methods  Explanation;Demonstration;Tactile cues;Verbal cues    Comprehension  Verbalized understanding;Returned demonstration       PT Short Term Goals - 08/06/18 1619      PT SHORT TERM GOAL #1   Title  Patient to be independent with initial HEP.    Time  3    Period  Weeks    Status  Achieved        PT Long Term Goals - 08/06/18 1619      PT LONG TERM GOAL #1   Title  patient to be independent with advanced HEP    Time  7    Period  Weeks    Status  Partially Met   met for current     PT LONG TERM GOAL #2   Title  patient to demonstrate full and non-painful AROM (including multi-planar motions) without pain    Time  7    Period  Weeks    Status  On-going   improvements demonstrated in R shoulder flexion and abduction AROM and flexion, abduction, and IR PROM     PT LONG TERM GOAL #3   Title  patient to improve R shoulder strength to >/= 4+/5 without pain    Time  6    Period  Weeks    Status  Partially Met   improvements demonstrated in R shoulder abduction, IR, ER strength     PT LONG TERM GOAL #4   Title  Patient to demonstrate overhead reaching to place 5# object on  shelf overhead with <=1/10 pain.    Time  6    Period  Weeks    Status   Partially Met   able to reach to overhead shelf with 3lbs with no compensations and good scapulohumeral rhythm     PT LONG TERM GOAL #5   Title  Patient to report tolerance of 1 full shift at work without pain.    Time  6    Period  Weeks    Status  Partially Met   reports no increased R shoulder pain with light duty     PT LONG TERM GOAL #6   Title  Patient to report tolerance of fastening bra without pain.     Time  7    Period  Weeks    Status  Partially Met   reporting abillity to don/dof some bras, not all at this time           Plan - 08/06/18 1706    Clinical Impression Statement  Patient arrived to session with report of 80% improvement since surgery- noting improvements in ROM, ability to lift, and return to work on light duty. Patient has been diligent with HEP. Improvements demonstrated in R shoulder flexion and abduction AROM and flexion, abduction, and IR PROM at this time. Improvements demonstrated in R shoulder abduction, IR, ER strength. Patient is able to reach to overhead shelf with 3lbs with no compensations and good scapulohumeral rhythm. Reports that at this time she is able to don some undergarments using behind-the-back IR, depending on the garment. Patient tolerated PROM and posterior joint mobilizations with continued focus on extension and IR in order to improve behind-the-back stretch. Patient able to visibly improve ROM with IR stretch after manual therapy. Tolerated introduction of shoulder diagonals with light banded resistance with complaints. Ended session with Gameready to R shoulder for post-exercise soreness. Patient is showing steady progress with PT. would benefit from continued skilled PT services to address remaining goals and return to work at full duty.     PT Treatment/Interventions  ADLs/Self Care Home Management;Cryotherapy;Electrical Stimulation;Moist Heat;Therapeutic exercise;Therapeutic activities;Neuromuscular re-education;Patient/family  education;Manual techniques;Vasopneumatic Device;Taping;Dry needling;Passive range of motion;Ultrasound;Splinting;Energy conservation;Scar mobilization    Consulted and Agree with Plan of Care  Patient       Patient will benefit from skilled therapeutic intervention in order to improve the following deficits and impairments:  Pain, Impaired UE functional use, Decreased strength, Decreased range of motion, Decreased activity tolerance, Decreased scar mobility, Postural dysfunction, Impaired flexibility  Visit Diagnosis: Acute pain of right shoulder  Stiffness of right shoulder, not elsewhere classified  Other symptoms and signs involving the musculoskeletal system  Muscle weakness (generalized)     Problem List Patient Active Problem List   Diagnosis Date Noted  . S/P arthroscopy of right shoulder 04/17/2018    Janene Harvey, PT, DPT 08/06/18 5:10 PM   [facility] [address]  [place], [place], [address] Phone: [phone]   Fax:  [phone]  Name: Monica Petty MRN: [id number] Date of Birth: October 26, 1965

## 2018-08-13 ENCOUNTER — Encounter: Payer: Self-pay | Admitting: Physical Therapy

## 2018-08-13 ENCOUNTER — Ambulatory Visit: Payer: PRIVATE HEALTH INSURANCE | Admitting: Physical Therapy

## 2018-08-13 DIAGNOSIS — M25611 Stiffness of right shoulder, not elsewhere classified: Secondary | ICD-10-CM

## 2018-08-13 DIAGNOSIS — M6281 Muscle weakness (generalized): Secondary | ICD-10-CM

## 2018-08-13 DIAGNOSIS — M25511 Pain in right shoulder: Secondary | ICD-10-CM

## 2018-08-13 DIAGNOSIS — R29898 Other symptoms and signs involving the musculoskeletal system: Secondary | ICD-10-CM

## 2018-08-13 NOTE — Therapy (Signed)
[facility] [address]  [place], [place], [address] Phone: [phone]   Fax:  [phone]  Physical Therapy Treatment  Patient Details  Name: Monica Petty MRN: [id number] Date of Birth: September 26, 1965 Referring Provider (PT): Sydnee Cabal, MD   Encounter Date: 08/13/2018  PT End of Session - 08/13/18 1758    Visit Number  33    Number of Visits  36    Date for PT Re-Evaluation  09/06/18    Authorization Type  WC    Authorization Time Period  6 visits pre-op; 12 visits    Authorization - Visit Number  29    Authorization - Number of Visits  36    PT Start Time  1615    PT Stop Time  1711    PT Time Calculation (min)  56 min    Activity Tolerance  Patient tolerated treatment well    Behavior During Therapy  WFL for tasks assessed/performed       Past Medical History:  Diagnosis Date  . Anxiety   . Diverticulosis of colon   . GERD (gastroesophageal reflux disease)    takes okra pepsin supplement  . History of concussion    1987  fell of horse--- no residual  . Hypertension   . Migraine    occasional    Past Surgical History:  Procedure Laterality Date  . COLONOSCOPY  06/06/2016  . LAPAROSCOPIC CHOLECYSTECTOMY  06-03-2008   dr Ulyess Blossom  [facility]  . REDUCTION MAMMAPLASTY Bilateral 03-05-2002   dr barber  [facility]  . SHOULDER ARTHROSCOPY WITH ROTATOR CUFF REPAIR AND SUBACROMIAL DECOMPRESSION Right 04/17/2018   Procedure: Right shoulder evaluation under anesthesia, scope, debridement, subacromial decompression, rotator cuff repair;  Surgeon: Sydnee Cabal, MD;  Location: [facility];  Service: Orthopedics;  Laterality: Right;  120 mins General with Intra Scalene Block  . VAGINAL HYSTERECTOMY  02-12-2002   dr Nori Riis  [facility]  . WISDOM TOOTH EXTRACTION  teen    There were no vitals filed for this visit.  Subjective Assessment - 08/13/18 1617    Subjective  Reports reports she was sore until  Wednesday last week. Saw PA last week who advised to continue with PT 1x/week. Not expected to return to full duty until early January.     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 Maximum retraction is 16.5 mm and the tear is 14 mm wide. Mild tendinopathy involving the infraspinatus tendon probable small intrasubstance tears. The subscapularis tendon is intact.    Patient Stated Goals  keep strengthening and mobility of R shoulder    Currently in Pain?  No/denies                       [facility] Adult PT Treatment/Exercise - 08/13/18 0001      Shoulder Exercises: Seated   Other Seated Exercises  R shoulder ER + flexion with 1# x5 with heavy cues and supervision   non painful popping in R shoulder     Shoulder Exercises: Standing   External Rotation  Strengthening;Both;Theraband    Theraband Level (Shoulder External Rotation)  Level 2 (Red)    External Rotation Weight (lbs)  15x    Internal Rotation  Strengthening;Right;10 reps;Theraband;Limitations   heavy cues to stop at neutral shoulder and wrist   Theraband Level (Shoulder Internal Rotation)  Level 3 (Green)    Internal Rotation Limitations  x15    Row  Strengthening;Both;10 reps;Theraband   cues to maintain elbows in   Theraband Level (Shoulder Row)  Level 2 (Red)    Row Limitations  row + B ER    Other Standing Exercises  B shoulder Y lift offs at wall x10    Other Standing Exercises  wall push up with hands shoulder width apart to tolerance x 10   cues for elbows in line with hands     Shoulder Exercises: Pulleys   Flexion  3 minutes    Flexion Limitations  to tolerance    Scaption  3 minutes    Scaption Limitations  to tolerance      Shoulder Exercises: Stretch   Other Shoulder Stretches  R shoulder IR apley stretch 5x5" to tolerance with strap   performed before and after manual therapy     Vasopneumatic   Number Minutes Vasopneumatic    15 minutes    Vasopnuematic Location   Shoulder   R   Vasopneumatic Pressure  Low    Vasopneumatic Temperature   Coldest      Manual Therapy   Manual Therapy  Passive ROM    Joint Mobilization  R shoulder distraction grade III to tolerance with all PROM directions    Passive ROM  Slow R shoulder PROM into flexion, extension, abduction, IR/ER to tolerance with prolonged holds at end range             PT Education - 08/13/18 1758    Education provided  Yes    Education Details  update to [employer]) Educated  Patient    Methods  Explanation;Demonstration;Tactile cues;Verbal cues;Handout    Comprehension  Verbalized understanding;Returned demonstration       PT Short Term Goals - 08/06/18 1619      PT SHORT TERM GOAL #1   Title  Patient to be independent with initial HEP.    Time  3    Period  Weeks    Status  Achieved        PT Long Term Goals - 08/06/18 1619      PT LONG TERM GOAL #1   Title  patient to be independent with advanced HEP    Time  7    Period  Weeks    Status  Partially Met   met for current     PT LONG TERM GOAL #2   Title  patient to demonstrate full and non-painful AROM (including multi-planar motions) without pain    Time  7    Period  Weeks    Status  On-going   improvements demonstrated in R shoulder flexion and abduction AROM and flexion, abduction, and IR PROM     PT LONG TERM GOAL #3   Title  patient to improve R shoulder strength to >/= 4+/5 without pain    Time  6    Period  Weeks    Status  Partially Met   improvements demonstrated in R shoulder abduction, IR, ER strength     PT LONG TERM GOAL #4   Title  Patient to demonstrate overhead reaching to place 5# object on shelf overhead with <=1/10 pain.    Time  6    Period  Weeks    Status  Partially Met   able to reach to overhead shelf with 3lbs with no compensations and good scapulohumeral rhythm     PT LONG TERM GOAL #5   Title  Patient to  report tolerance of 1 full  shift at work without pain.    Time  6    Period  Weeks    Status  Partially Met   reports no increased R shoulder pain with light duty     PT LONG TERM GOAL #6   Title  Patient to report tolerance of fastening bra without pain.     Time  7    Period  Weeks    Status  Partially Met   reporting abillity to don/dof some bras, not all at this time           Plan - 08/13/18 1759    Clinical Impression Statement  Patient arrived to session with report of R shoulder muscle soreness after last session. Saw MD who increased overhead weight restriction to 10lbs. Patient tolerated R shoulder PROM in all planes to tolerance, with most focus on abduction, IR, and extension as these are most limited. Better tolerance with addition of joint distraction to PROM. Attempted R shoulder IR behind the back stretch with strap before and after manual therapy, with patient reporting less "pulling" sensation after manual therapy. Introduced more challenging banded resistance exercises today with careful attention to form to avoid harmful movement patterns. Patient with nonpainful clicking in R shoulder with ER with flexion exercise. Ended session with Gameready to R shoulder to ease muscle soreness. No complaints at end of session. Updated HEP and administered handout- patient without questions.     PT Treatment/Interventions  ADLs/Self Care Home Management;Cryotherapy;Electrical Stimulation;Moist Heat;Therapeutic exercise;Therapeutic activities;Neuromuscular re-education;Patient/family education;Manual techniques;Vasopneumatic Device;Taping;Dry needling;Passive range of motion;Ultrasound;Splinting;Energy conservation;Scar mobilization    PT Next Visit Plan  assess response to today's progression     Consulted and Agree with Plan of Care  Patient       Patient will benefit from skilled therapeutic intervention in order to improve the following deficits and impairments:  Pain, Impaired UE functional use,  Decreased strength, Decreased range of motion, Decreased activity tolerance, Decreased scar mobility, Postural dysfunction, Impaired flexibility  Visit Diagnosis: Acute pain of right shoulder  Stiffness of right shoulder, not elsewhere classified  Other symptoms and signs involving the musculoskeletal system  Muscle weakness (generalized)     Problem List Patient Active Problem List   Diagnosis Date Noted  . S/P arthroscopy of right shoulder 04/17/2018     Janene Harvey, PT, DPT 08/13/18 6:05 PM   [facility] [address]  [place], [place], [address] Phone: [phone]   Fax:  [phone]  Name: ESMERALDA MALAY MRN: [id number] Date of Birth: 1966/07/18

## 2018-08-14 MED FILL — PREMARIN 1.25 MG TABLET: 1.25 | 90 days supply | Qty: 90 | Fill #0

## 2018-08-15 MED FILL — HYDROCHLOROTHIAZIDE 25 MG T: 25 | 90 days supply | Qty: 90 | Fill #0

## 2018-08-20 ENCOUNTER — Ambulatory Visit: Payer: PRIVATE HEALTH INSURANCE | Attending: Nurse Practitioner | Admitting: Physical Therapy

## 2018-08-20 ENCOUNTER — Encounter: Payer: Self-pay | Admitting: Physical Therapy

## 2018-08-20 DIAGNOSIS — R29898 Other symptoms and signs involving the musculoskeletal system: Secondary | ICD-10-CM | POA: Diagnosis present

## 2018-08-20 DIAGNOSIS — M25511 Pain in right shoulder: Secondary | ICD-10-CM | POA: Diagnosis present

## 2018-08-20 DIAGNOSIS — M6281 Muscle weakness (generalized): Secondary | ICD-10-CM | POA: Diagnosis present

## 2018-08-20 DIAGNOSIS — M25611 Stiffness of right shoulder, not elsewhere classified: Secondary | ICD-10-CM | POA: Insufficient documentation

## 2018-08-20 NOTE — Therapy (Signed)
[facility] [address]  [place], [place], [address] Phone: [phone]   Fax:  [phone]  Physical Therapy Treatment  Patient Details  Name: Monica Petty MRN: [id number] Date of Birth: August 25, 1966 Referring Provider (PT): Sydnee Cabal, MD   Encounter Date: 08/20/2018  PT End of Session - 08/20/18 1701    Visit Number  34    Number of Visits  36    Date for PT Re-Evaluation  09/06/18    Authorization Type  WC    Authorization Time Period  6 visits pre-op; 12 visits    Authorization - Visit Number  76    Authorization - Number of Visits  36    PT Start Time  0258    PT Stop Time  1708    PT Time Calculation (min)  51 min    Activity Tolerance  Patient tolerated treatment well    Behavior During Therapy  [facility] for tasks assessed/performed       Past Medical History:  Diagnosis Date  . Anxiety   . Diverticulosis of colon   . GERD (gastroesophageal reflux disease)    takes okra pepsin supplement  . History of concussion    1987  fell of horse--- no residual  . Hypertension   . Migraine    occasional    Past Surgical History:  Procedure Laterality Date  . COLONOSCOPY  06/06/2016  . LAPAROSCOPIC CHOLECYSTECTOMY  06-03-2008   dr Ulyess Blossom  [facility]  . REDUCTION MAMMAPLASTY Bilateral 03-05-2002   dr barber  [facility]  . SHOULDER ARTHROSCOPY WITH ROTATOR CUFF REPAIR AND SUBACROMIAL DECOMPRESSION Right 04/17/2018   Procedure: Right shoulder evaluation under anesthesia, scope, debridement, subacromial decompression, rotator cuff repair;  Surgeon: Sydnee Cabal, MD;  Location: [facility];  Service: Orthopedics;  Laterality: Right;  120 mins General with Intra Scalene Block  . VAGINAL HYSTERECTOMY  02-12-2002   dr Nori Riis  [facility]  . WISDOM TOOTH EXTRACTION  teen    There were no vitals filed for this visit.  Subjective Assessment - 08/20/18 1620    Subjective  Patient reports that she had a good  holiday. Only time it bothered her was when she was stirring.     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 Maximum retraction is 16.5 mm and the tear is 14 mm wide. Mild tendinopathy involving the infraspinatus tendon probable small intrasubstance tears. The subscapularis tendon is intact.    Patient Stated Goals  keep strengthening and mobility of R shoulder    Currently in Pain?  No/denies                       [facility] Adult PT Treatment/Exercise - 08/20/18 0001      Shoulder Exercises: Seated   Other Seated Exercises  lat pulldown x10 15#   cues for stretch at top of motion     Shoulder Exercises: Prone   Other Prone Exercises  R UE prone I, T, Y over orange pball 1st set 0#, 2nd set 1#   cues for scap upward rotation and retraction   Other Prone Exercises  R UE prone over orange pball W x10   adjustment of form required     Shoulder Exercises: Standing   Other Standing Exercises  R UE D2 flexion with yellow TB x10       Shoulder Exercises: Pulleys   Flexion  3 minutes    Flexion Limitations  to tolerance    Scaption  3 minutes    Scaption Limitations  to tolerance      Shoulder Exercises: ROM/Strengthening   Cybex Row Limitations  B UE BATCA row 15# with cues for scap retraction   unable to perform 20# without shoulder hiking   Other ROM/Strengthening Exercises  BATCA serratus punch B UEs 15x 15#    Other ROM/Strengthening Exercises  straight arm pulldown 10x 10#      Shoulder Exercises: Stretch   Cross Chest Stretch  2 reps;20 seconds;Limitations   R UE     Vasopneumatic   Number Minutes Vasopneumatic   15 minutes    Vasopnuematic Location   Shoulder   R   Vasopneumatic Pressure  Low    Vasopneumatic Temperature   Coldest             PT Education - 08/20/18 1701    Education provided  Yes    Education Details  update to HEP; advised to replace dumbbell row with machine row     Person(s) Educated  Patient    Methods  Explanation;Demonstration;Tactile cues;Verbal cues;Handout    Comprehension  Verbalized understanding;Returned demonstration       PT Short Term Goals - 08/06/18 1619      PT SHORT TERM GOAL #1   Title  Patient to be independent with initial HEP.    Time  3    Period  Weeks    Status  Achieved        PT Long Term Goals - 08/06/18 1619      PT LONG TERM GOAL #1   Title  patient to be independent with advanced HEP    Time  7    Period  Weeks    Status  Partially Met   met for current     PT LONG TERM GOAL #2   Title  patient to demonstrate full and non-painful AROM (including multi-planar motions) without pain    Time  7    Period  Weeks    Status  On-going   improvements demonstrated in R shoulder flexion and abduction AROM and flexion, abduction, and IR PROM     PT LONG TERM GOAL #3   Title  patient to improve R shoulder strength to >/= 4+/5 without pain    Time  6    Period  Weeks    Status  Partially Met   improvements demonstrated in R shoulder abduction, IR, ER strength     PT LONG TERM GOAL #4   Title  Patient to demonstrate overhead reaching to place 5# object on shelf overhead with <=1/10 pain.    Time  6    Period  Weeks    Status  Partially Met   able to reach to overhead shelf with 3lbs with no compensations and good scapulohumeral rhythm     PT LONG TERM GOAL #5   Title  Patient to report tolerance of 1 full shift at work without pain.    Time  6    Period  Weeks    Status  Partially Met   reports no increased R shoulder pain with light duty     PT LONG TERM GOAL #6   Title  Patient to report tolerance of fastening bra without pain.     Time  7    Period  Weeks    Status  Partially Met   reporting abillity to don/dof  some bras, not all at this time           Plan - 08/20/18 1702    Clinical Impression Statement  Patient arrived to session with report that she had a bit of trouble stirring pots  while cooking over the holiday but felt good overall. Worked on progressive periscapular strengthening today with intermittent manual and verbal cues for form. Patient with good tolerance of prone I, T, Y's however more difficulty exhibited with prone W's which were introduced for the first time. Increased difficulty when assisting patient to maintain 90 degree abduction. Reviewed machine strengthening exercises with patient demonstrating B shoulder hiking with 20lbs scapular row, better form when decreased down to 15lbs. Updated HEP with prone strengthening and machine strengthening that were well tolerated today. Patient requesting Gameready to R shoulder at end of session. Ended session with no complaints.     PT Treatment/Interventions  ADLs/Self Care Home Management;Cryotherapy;Electrical Stimulation;Moist Heat;Therapeutic exercise;Therapeutic activities;Neuromuscular re-education;Patient/family education;Manual techniques;Vasopneumatic Device;Taping;Dry needling;Passive range of motion;Ultrasound;Splinting;Energy conservation;Scar mobilization    PT Next Visit Plan  progress RTC and periscapular strengthening to tolerance    Consulted and Agree with Plan of Care  Patient       Patient will benefit from skilled therapeutic intervention in order to improve the following deficits and impairments:  Pain, Impaired UE functional use, Decreased strength, Decreased range of motion, Decreased activity tolerance, Decreased scar mobility, Postural dysfunction, Impaired flexibility  Visit Diagnosis: Acute pain of right shoulder  Stiffness of right shoulder, not elsewhere classified  Other symptoms and signs involving the musculoskeletal system  Muscle weakness (generalized)     Problem List Patient Active Problem List   Diagnosis Date Noted  . S/P arthroscopy of right shoulder 04/17/2018     Janene Harvey, PT, DPT 08/20/18 5:09 PM   [facility] [address]  [place], [place], [address] Phone: [phone]   Fax:  [phone]  Name: Monica Petty MRN: [id number] Date of Birth: 1966-04-10

## 2018-08-27 ENCOUNTER — Encounter: Payer: Self-pay | Admitting: Physical Therapy

## 2018-08-27 ENCOUNTER — Ambulatory Visit: Payer: PRIVATE HEALTH INSURANCE | Admitting: Physical Therapy

## 2018-08-27 DIAGNOSIS — M25511 Pain in right shoulder: Secondary | ICD-10-CM | POA: Diagnosis not present

## 2018-08-27 DIAGNOSIS — R29898 Other symptoms and signs involving the musculoskeletal system: Secondary | ICD-10-CM

## 2018-08-27 DIAGNOSIS — M6281 Muscle weakness (generalized): Secondary | ICD-10-CM

## 2018-08-27 DIAGNOSIS — M25611 Stiffness of right shoulder, not elsewhere classified: Secondary | ICD-10-CM

## 2018-08-27 NOTE — Therapy (Signed)
[facility] [address]  [place], [place], [address] Phone: [phone]   Fax:  [phone]  Physical Therapy Treatment  Patient Details  Name: EMEREE MAHLER MRN: [id number] Date of Birth: Nov 25, 1965 Referring Provider (PT): Sydnee Cabal, MD   Encounter Date: 08/27/2018  PT End of Session - 08/27/18 1802    Visit Number  35    Number of Visits  36    Date for PT Re-Evaluation  09/06/18    Authorization Type  WC    Authorization Time Period  6 visits pre-op; 12 visits    Authorization - Visit Number  36    Authorization - Number of Visits  53    PT Start Time  6962    PT Stop Time  1702    PT Time Calculation (min)  47 min    Activity Tolerance  Patient tolerated treatment well    Behavior During Therapy  [facility] for tasks assessed/performed       Past Medical History:  Diagnosis Date  . Anxiety   . Diverticulosis of colon   . GERD (gastroesophageal reflux disease)    takes okra pepsin supplement  . History of concussion    1987  fell of horse--- no residual  . Hypertension   . Migraine    occasional    Past Surgical History:  Procedure Laterality Date  . COLONOSCOPY  06/06/2016  . LAPAROSCOPIC CHOLECYSTECTOMY  06-03-2008   dr Ulyess Blossom  [facility]  . REDUCTION MAMMAPLASTY Bilateral 03-05-2002   dr barber  [facility]  . SHOULDER ARTHROSCOPY WITH ROTATOR CUFF REPAIR AND SUBACROMIAL DECOMPRESSION Right 04/17/2018   Procedure: Right shoulder evaluation under anesthesia, scope, debridement, subacromial decompression, rotator cuff repair;  Surgeon: Sydnee Cabal, MD;  Location: [facility];  Service: Orthopedics;  Laterality: Right;  120 mins General with Intra Scalene Block  . VAGINAL HYSTERECTOMY  02-12-2002   dr Nori Riis  [facility]  . WISDOM TOOTH EXTRACTION  teen    There were no vitals filed for this visit.  Subjective Assessment - 08/27/18 1616    Subjective  Reports she is doing well. Brought in HEP  folder for consolidation.    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 Maximum retraction is 16.5 mm and the tear is 14 mm wide. Mild tendinopathy involving the infraspinatus tendon probable small intrasubstance tears. The subscapularis tendon is intact.    Patient Stated Goals  keep strengthening and mobility of R shoulder    Currently in Pain?  No/denies         [facility] PT Assessment - 08/27/18 0001      AROM   Right/Left Shoulder  Right    Right Shoulder Flexion  154 Degrees    Right Shoulder ABduction  119 Degrees                   [facility] Adult PT Treatment/Exercise - 08/27/18 0001      Shoulder Exercises: Seated   Abduction  Limitations;AAROM;Right;10 reps    ABduction Weight (lbs)  R shoulder AAROM with ball 10x5"      Shoulder Exercises: Prone   Other Prone Exercises  R UE prone I, T, Y over orange pball 10x 1#   good form     Shoulder Exercises: Standing   External Rotation  Strengthening;Both;5 reps;Theraband    Theraband Level (Shoulder External Rotation)  Level 3 (Green);Level 2 (Red)  External Rotation Weight (lbs)  5x each with red and green    Flexion  Strengthening;Right;Weights;5 reps    Shoulder Flexion Weight (lbs)  2, 3, 4    Flexion Limitations  5 reps each of 2, 3, 4#; good scapulohumeral rhythm    ABduction  Strengthening;Right;5 reps    ABduction Limitations  5 reps each of 2 and 3 lbs; good control      Shoulder Exercises: Pulleys   Flexion  3 minutes    Flexion Limitations  to tolerance    Scaption  3 minutes    Scaption Limitations  to tolerance      Shoulder Exercises: Stretch   Cross Chest Stretch Limitations  R shoulder ER stretch at doorway 2x20" to tol             PT Education - 08/27/18 1801    Education provided  Yes    Education Details  thorough consolidation of HEP and discussion of appropriate weighted and banded progressions of exercise     Person(s) Educated  Patient    Methods  Explanation;Demonstration;Tactile cues;Verbal cues;Handout    Comprehension  Verbalized understanding;Returned demonstration       PT Short Term Goals - 08/06/18 1619      PT SHORT TERM GOAL #1   Title  Patient to be independent with initial HEP.    Time  3    Period  Weeks    Status  Achieved        PT Long Term Goals - 08/06/18 1619      PT LONG TERM GOAL #1   Title  patient to be independent with advanced HEP    Time  7    Period  Weeks    Status  Partially Met   met for current     PT LONG TERM GOAL #2   Title  patient to demonstrate full and non-painful AROM (including multi-planar motions) without pain    Time  7    Period  Weeks    Status  On-going   improvements demonstrated in R shoulder flexion and abduction AROM and flexion, abduction, and IR PROM     PT LONG TERM GOAL #3   Title  patient to improve R shoulder strength to >/= 4+/5 without pain    Time  6    Period  Weeks    Status  Partially Met   improvements demonstrated in R shoulder abduction, IR, ER strength     PT LONG TERM GOAL #4   Title  Patient to demonstrate overhead reaching to place 5# object on shelf overhead with <=1/10 pain.    Time  6    Period  Weeks    Status  Partially Met   able to reach to overhead shelf with 3lbs with no compensations and good scapulohumeral rhythm     PT LONG TERM GOAL #5   Title  Patient to report tolerance of 1 full shift at work without pain.    Time  6    Period  Weeks    Status  Partially Met   reports no increased R shoulder pain with light duty     PT LONG TERM GOAL #6   Title  Patient to report tolerance of fastening bra without pain.     Time  7    Period  Weeks    Status  Partially Met   reporting abillity to don/dof some bras, not all at this time  Plan - 08/27/18 1804    Clinical Impression Statement  Patient arrived to session with no new complaints. Brought in HEP folder for  consolidation of HEP this session. Spent considerable time with patient for thorough consolidation of HEP and discussion of appropriate weighted and banded progressions of exercise. Patient was able to progress standing R shoulder flexion and abduction with 3 lbs today. Did perform flexion with 4 lbs with some difficulty, however good ROM and scapulohumeral rhythm demonstrated. Introduced ER stretch at door which was tolerated well, however kept Apley ER stretch in HEP for patient to see which stretch gave her more benefit. Prone I,T, Y's were performed with 1lb weight with excellent form. Took R shoulder flexion and abduction AROM which showed improvement in both planes. Patient ended session with no complaints or soreness. Patient progressing well per POC; will benefit from continued skilled PT.     PT Treatment/Interventions  ADLs/Self Care Home Management;Cryotherapy;Electrical Stimulation;Moist Heat;Therapeutic exercise;Therapeutic activities;Neuromuscular re-education;Patient/family education;Manual techniques;Vasopneumatic Device;Taping;Dry needling;Passive range of motion;Ultrasound;Splinting;Energy conservation;Scar mobilization    PT Next Visit Plan  progress RTC and periscapular strengthening to tolerance    Consulted and Agree with Plan of Care  Patient       Patient will benefit from skilled therapeutic intervention in order to improve the following deficits and impairments:  Pain, Impaired UE functional use, Decreased strength, Decreased range of motion, Decreased activity tolerance, Decreased scar mobility, Postural dysfunction, Impaired flexibility  Visit Diagnosis: Acute pain of right shoulder  Stiffness of right shoulder, not elsewhere classified  Other symptoms and signs involving the musculoskeletal system  Muscle weakness (generalized)     Problem List Patient Active Problem List   Diagnosis Date Noted  . S/P arthroscopy of right shoulder 04/17/2018    Janene Harvey, PT, DPT 08/27/18 6:10 PM   [facility] [address]  [place], [place], [address] Phone: [phone]   Fax:  [phone]  Name: LIELA RYLEE MRN: [id number] Date of Birth: 01/29/1966

## 2018-09-03 ENCOUNTER — Encounter: Payer: Self-pay | Admitting: Physical Therapy

## 2018-09-03 ENCOUNTER — Ambulatory Visit: Payer: PRIVATE HEALTH INSURANCE | Admitting: Physical Therapy

## 2018-09-03 DIAGNOSIS — M6281 Muscle weakness (generalized): Secondary | ICD-10-CM

## 2018-09-03 DIAGNOSIS — R29898 Other symptoms and signs involving the musculoskeletal system: Secondary | ICD-10-CM

## 2018-09-03 DIAGNOSIS — M25511 Pain in right shoulder: Secondary | ICD-10-CM | POA: Diagnosis not present

## 2018-09-03 DIAGNOSIS — M25611 Stiffness of right shoulder, not elsewhere classified: Secondary | ICD-10-CM

## 2018-09-03 NOTE — Therapy (Signed)
[facility] [place], [place], [address] Phone: [phone]   Fax:  [phone]  Physical Therapy Treatment  Patient Details  Name: Monica Petty MRN: [id number] Date of Birth: [birth year] Referring Provider (PT): Sydnee Cabal, MD   Encounter Date: 09/03/2018  PT End of Session - 09/03/18 1657    Visit Number  36    Number of Visits  40    Date for PT Re-Evaluation  10/01/18    Authorization Type  WC    Authorization Time Period  6 visits pre-op; 12 visits    Authorization - Visit Number  52    Authorization - Number of Visits  36    PT Start Time  1616    PT Stop Time  1654    PT Time Calculation (min)  38 min    Activity Tolerance  Patient tolerated treatment well    Behavior During Therapy  WFL for tasks assessed/performed       Past Medical History:  Diagnosis Date  . Anxiety   . Diverticulosis of colon   . GERD (gastroesophageal reflux disease)    takes okra pepsin supplement  . History of concussion    1987  fell of horse--- no residual  . Hypertension   . Migraine    occasional    Past Surgical History:  Procedure Laterality Date  . COLONOSCOPY  06/06/2016  . LAPAROSCOPIC CHOLECYSTECTOMY  06-03-2008   dr Ulyess Blossom  [facility]  . REDUCTION MAMMAPLASTY Bilateral 03-05-2002   dr barber  [facility]  . SHOULDER ARTHROSCOPY WITH ROTATOR CUFF REPAIR AND SUBACROMIAL DECOMPRESSION Right 04/17/2018   Procedure: Right shoulder evaluation under anesthesia, scope, debridement, subacromial decompression, rotator cuff repair;  Surgeon: Sydnee Cabal, MD;  Location: [facility];  Service: Orthopedics;  Laterality: Right;  120 mins General with Intra Scalene Block  . VAGINAL HYSTERECTOMY  02-12-2002   dr Nori Riis  [facility]  . WISDOM TOOTH EXTRACTION  teen    There were no vitals filed for this visit.  Subjective Assessment - 09/03/18 1618    Subjective  Patient reports she has been feeling good  with her shoulder and has noticed good progress. 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 Still avoiding heavy overhead lifting like lifting heavy plates with R arm, not walking dog with R hand. Reports 75-80% improvement since initial eval.     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 Maximum retraction is 16.5 mm and the tear is 14 mm wide. Mild tendinopathy involving the infraspinatus tendon probable small intrasubstance tears. The subscapularis tendon is intact.    Patient Stated Goals  keep strengthening and mobility of R shoulder    Currently in Pain?  No/denies         [facility] PT Assessment - 09/03/18 0001      Assessment   Medical Diagnosis  Traumatic rupture of RTC (R)    Referring Provider (PT)  Sydnee Cabal, MD    Onset Date/Surgical Date  04/17/18      AROM   Right/Left Shoulder  Right    Right Shoulder Flexion  155 Degrees    Right Shoulder ABduction  129 Degrees    Right Shoulder Internal Rotation  60 Degrees    Right Shoulder External Rotation  85 Degrees      PROM   PROM Assessment Site  Shoulder    Right/Left Shoulder  Right    Right Shoulder Flexion  160 Degrees    Right Shoulder ABduction  150 Degrees    Right Shoulder Internal Rotation  90 Degrees    Right Shoulder External Rotation  72 Degrees      Strength   Right/Left Shoulder  Right    Right Shoulder Flexion  4+/5    Right Shoulder ABduction  4+/5    Right Shoulder Internal Rotation  4+/5    Right Shoulder External Rotation  4+/5                   [facility] Adult PT Treatment/Exercise - 09/03/18 0001      Shoulder Exercises: Seated   Other Seated Exercises  B shoulder rhythmic stabilization at 90 deg with red medball 2x30"      Shoulder Exercises: Prone   Other Prone Exercises  R UE prone W over green pball x10   good alignment   Other Prone  Exercises  R UE prone snow angels over green pball 10x    c/o nonpainful popping     Shoulder Exercises: Standing   Flexion  Strengthening;Right;Weights;10 reps    Shoulder Flexion Weight (lbs)  4    Flexion Limitations  lifting 4# weight to overhead cabinet    Other Standing Exercises  wall push ups x10   cues to maintain elbows in and    Other Standing Exercises  R UE scaption with 3#    good control     Shoulder Exercises: Pulleys   Flexion  3 minutes    Flexion Limitations  to tolerance    Scaption  3 minutes    Scaption Limitations  to tolerance      Manual Therapy   Manual Therapy  Passive ROM    Passive ROM  gentle R shoulder PROM in all planes to tolerance             PT Education - 09/03/18 1655    Education provided  Yes    Education Details  advised to progress R shoulder flexion with 4#, R shoulder scaption with 3#, advised to progress UE machine strengthening as tolerated and per MD's precautions    Person(s) Educated  Patient    Methods  Explanation;Demonstration;Tactile cues;Verbal cues    Comprehension  Verbalized understanding;Returned demonstration       PT Short Term Goals - 09/03/18 1624      PT SHORT TERM GOAL #1   Title  Patient to be independent with initial HEP.    Time  3    Period  Weeks    Status  Achieved        PT Long Term Goals - 09/03/18 1624      PT LONG TERM GOAL #1   Title  patient to be independent with advanced HEP    Time  4    Period  Weeks    Status  Partially Met   met for current   Target Date  10/01/18      PT LONG TERM GOAL #2   Title  patient to demonstrate full and non-painful AROM (including multi-planar motions) without pain    Time  4    Period  Weeks    Status  Partially Met   improvements demonstrated in R shoulder AROM in all planes   Target Date  10/01/18      PT LONG TERM GOAL #3   Title  patient to improve R  shoulder strength to >/= 4+/5 without pain    Time  6    Period  Weeks    Status   Achieved      PT LONG TERM GOAL #4   Title  Patient to demonstrate overhead reaching to place 5# object on shelf overhead with <=1/10 pain.    Time  4    Period  Weeks    Status  Partially Met   able to reach to overhead shelf with 4lbs with no compensations and good scapulohumeral rhythm   Target Date  10/01/18      PT LONG TERM GOAL #5   Title  Patient to report tolerance of 1 full shift at work without pain.    Time  4    Period  Weeks    Status  Partially Met   reports no R shoulder pain with light duty; has not returned to full duty   Target Date  10/01/18      PT LONG TERM GOAL #6   Title  Patient to report tolerance of fastening bra without pain.     Time  7    Period  Weeks    Status  Achieved   able to perform without pain           Plan - 09/03/18 1751    Clinical Impression Statement  Patient arrived to session with report of 75-80% improvement in R shoulder since initial eval. 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 Also notes that she still avoids heavy lifting with R UE. Patient has met strength and dressing goal at this time. Also continues to show improvement in R shoulder AROM in all planes. Patient still limited in overhead reaching ability- was able to perform 4lb overhead reach today with good shoulder mechanics. Advised patient to progress R shoulder flexion with 4lbs at home as well as R shoulder scaption with 3lbs. Educated patient on progressing UE machine strengthening as tolerated and per MD's precautions. Patient reported understanding. Tolerated today's session well with no complaints at end of session and no modality needed for pain relief. Patient showing excellent progress towards goals. Would benefit from additional 1x/week for 4 weeks to return to PLOF and return to full duty.     PT Frequency  1x / week    PT Duration  4 weeks    PT Treatment/Interventions   ADLs/Self Care Home Management;Cryotherapy;Electrical Stimulation;Moist Heat;Therapeutic exercise;Therapeutic activities;Neuromuscular re-education;Patient/family education;Manual techniques;Vasopneumatic Device;Taping;Dry needling;Passive range of motion;Ultrasound;Splinting;Energy conservation;Scar mobilization    PT Next Visit Plan  progress RTC and periscapular strengthening to tolerance    Consulted and Agree with Plan of Care  Patient       Patient will benefit from skilled therapeutic intervention in order to improve the following deficits and impairments:  Pain, Impaired UE functional use, Decreased strength, Decreased range of motion, Decreased activity tolerance, Decreased scar mobility, Postural dysfunction, Impaired flexibility  Visit Diagnosis: Acute pain of right shoulder  Stiffness of right shoulder, not elsewhere classified  Other symptoms and signs involving the musculoskeletal system  Muscle weakness (generalized)     Problem List Patient Active Problem List   Diagnosis Date Noted  . S/P arthroscopy of right shoulder 04/17/2018    Janene Harvey, PT, DPT 09/03/18 6:04 PM   [facility] [address]  [place], [place], [address] Phone: [phone]   Fax:  [phone]  Name: Monica Petty MRN: [id number] Date of Birth: [birth year]

## 2018-09-05 MED FILL — LISINOPRIL 5 MG TABLET: 5 | 90 days supply | Qty: 90 | Fill #0

## 2018-09-05 MED FILL — RESTASIS 0.05% EYE EMULSION: 0.05 | 30 days supply | Qty: 60 | Fill #1

## 2018-09-06 MED FILL — ALPRAZolam 0.5 MG TABS: 0.5 | 30 days supply | Qty: 90 | Fill #0

## 2018-09-17 ENCOUNTER — Encounter: Payer: Self-pay | Admitting: Physical Therapy

## 2018-09-17 ENCOUNTER — Ambulatory Visit: Payer: PRIVATE HEALTH INSURANCE | Admitting: Physical Therapy

## 2018-09-17 DIAGNOSIS — M25511 Pain in right shoulder: Secondary | ICD-10-CM | POA: Diagnosis not present

## 2018-09-17 DIAGNOSIS — M6281 Muscle weakness (generalized): Secondary | ICD-10-CM

## 2018-09-17 DIAGNOSIS — M25611 Stiffness of right shoulder, not elsewhere classified: Secondary | ICD-10-CM

## 2018-09-17 DIAGNOSIS — R29898 Other symptoms and signs involving the musculoskeletal system: Secondary | ICD-10-CM

## 2018-09-17 NOTE — Therapy (Signed)
[facility] [place], [place], [address] Phone: [phone]   Fax:  [phone]  Physical Therapy Treatment  Patient Details  Name: Monica Petty MRN: [id number] Date of Birth: Dec 07, 1965 Referring Provider (PT): Sydnee Cabal, MD   Encounter Date: 09/17/2018  PT End of Session - 09/17/18 1656    Visit Number  37    Number of Visits  40    Date for PT Re-Evaluation  10/01/18    Authorization Type  WC    Authorization Time Period  6 visits pre-op; 12 visits    Authorization - Visit Number  13    Authorization - Number of Visits  36    PT Start Time  1620    PT Stop Time  1706    PT Time Calculation (min)  46 min    Activity Tolerance  Patient tolerated treatment well;Patient limited by pain    Behavior During Therapy  [facility] for tasks assessed/performed       Past Medical History:  Diagnosis Date  . Anxiety   . Diverticulosis of colon   . GERD (gastroesophageal reflux disease)    takes okra pepsin supplement  . History of concussion    1987  fell of horse--- no residual  . Hypertension   . Migraine    occasional    Past Surgical History:  Procedure Laterality Date  . COLONOSCOPY  06/06/2016  . LAPAROSCOPIC CHOLECYSTECTOMY  06-03-2008   dr Ulyess Blossom  [facility]  . REDUCTION MAMMAPLASTY Bilateral 03-05-2002   dr barber  [facility]  . SHOULDER ARTHROSCOPY WITH ROTATOR CUFF REPAIR AND SUBACROMIAL DECOMPRESSION Right 04/17/2018   Procedure: Right shoulder evaluation under anesthesia, scope, debridement, subacromial decompression, rotator cuff repair;  Surgeon: Sydnee Cabal, MD;  Location: [facility];  Service: Orthopedics;  Laterality: Right;  120 mins General with Intra Scalene Block  . VAGINAL HYSTERECTOMY  02-12-2002   dr Nori Riis  [facility]  . WISDOM TOOTH EXTRACTION  teen    There were no vitals filed for this visit.  Subjective Assessment - 09/17/18 1626    Subjective  Patient reports  she saw MD who told her to contoinue with remaining PT appointments. Says she strained her R shoulder 1.5 weeks ago and has been getting better.     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 Maximum retraction is 16.5 mm and the tear is 14 mm wide. Mild tendinopathy involving the infraspinatus tendon probable small intrasubstance tears. The subscapularis tendon is intact.    Patient Stated Goals  keep strengthening and mobility of R shoulder    Currently in Pain?  Yes    Pain Score  2     Pain Location  Shoulder    Pain Orientation  Right    Pain Descriptors / Indicators  Sore    Pain Type  Acute pain;Surgical pain                       [facility] Adult PT Treatment/Exercise - 09/17/18 0001      Shoulder Exercises: Supine   Protraction  Both;Strengthening;15 reps    Protraction Weight (lbs)  4#    Protraction Limitations  2x15      Shoulder Exercises: Prone   Retraction  Strengthening;Right;10 reps;Weights    Retraction Weight (lbs)  3    Retraction Limitations  2x10; leaning over orange pball  Extension  Strengthening;Right;10 reps;Weights    Extension Weight (lbs)  2    Extension Limitations  2x10; leaning over orange pball      Shoulder Exercises: Sidelying   External Rotation  Strengthening;Right;10 reps;Limitations;Weights    External Rotation Limitations  2x10; dowel under elbow to maintain neutral    ABduction  Strengthening;Right;10 reps;Weights;Limitations    ABduction Weight (lbs)  2    ABduction Limitations  good form      Shoulder Exercises: Standing   External Rotation  Strengthening;Theraband;Right;10 reps    Theraband Level (Shoulder External Rotation)  Level 2 (Red)    External Rotation Weight (lbs)  isometric step outs    Internal Rotation  Strengthening;Right;10 reps;Theraband;Limitations    Theraband Level (Shoulder Internal Rotation)  Level 2 (Red)    Internal Rotation  Limitations  isometric step outs       Vasopneumatic   Number Minutes Vasopneumatic   15 minutes    Vasopnuematic Location   Shoulder   R   Vasopneumatic Pressure  Low    Vasopneumatic Temperature   Coldest             PT Education - 09/17/18 1655    Education provided  Yes    Education Details  advised patient to ease back into HEP as tolerated and avoid heavy weighted resistance into ER    Person(s) Educated  Patient    Methods  Explanation;Demonstration    Comprehension  Verbalized understanding;Returned demonstration       PT Short Term Goals - 09/03/18 1624      PT SHORT TERM GOAL #1   Title  Patient to be independent with initial HEP.    Time  3    Period  Weeks    Status  Achieved        PT Long Term Goals - 09/03/18 1624      PT LONG TERM GOAL #1   Title  patient to be independent with advanced HEP    Time  4    Period  Weeks    Status  Partially Met   met for current   Target Date  10/01/18      PT LONG TERM GOAL #2   Title  patient to demonstrate full and non-painful AROM (including multi-planar motions) without pain    Time  4    Period  Weeks    Status  Partially Met   improvements demonstrated in R shoulder AROM in all planes   Target Date  10/01/18      PT LONG TERM GOAL #3   Title  patient to improve R shoulder strength to >/= 4+/5 without pain    Time  6    Period  Weeks    Status  Achieved      PT LONG TERM GOAL #4   Title  Patient to demonstrate overhead reaching to place 5# object on shelf overhead with <=1/10 pain.    Time  4    Period  Weeks    Status  Partially Met   able to reach to overhead shelf with 4lbs with no compensations and good scapulohumeral rhythm   Target Date  10/01/18      PT LONG TERM GOAL #5   Title  Patient to report tolerance of 1 full shift at work without pain.    Time  4    Period  Weeks    Status  Partially Met   reports no R shoulder pain with light duty;  has not returned to full duty   Target  Date  10/01/18      PT LONG TERM GOAL #6   Title  Patient to report tolerance of fastening bra without pain.     Time  7    Period  Weeks    Status  Achieved   able to perform without pain           Plan - 09/17/18 1657    Clinical Impression Statement  Patient arrived to session with report that she strained her R shoulder 1.5 weeks ago but pain as gotten progressively better. Reports she saw MD today who advised her to finish up with PT within the coming visits. Patient still with mild soreness in R shoulder today- worse with resisted ER. Worked on RTC and periscapular strengthening with light resistance today. Patient able to tolerate sidelying ER against gravity with no complains. Advised patient to ease back into HEP as tolerated and avoid heavy weighted resistance into ER. Patient agreeable. Ended session with [place] to R shoulder to ease pain. No complaints at end of session. Plan to progress next session per patient's tolerance.     PT Treatment/Interventions  ADLs/Self Care Home Management;Cryotherapy;Electrical Stimulation;Moist Heat;Therapeutic exercise;Therapeutic activities;Neuromuscular re-education;Patient/family education;Manual techniques;Vasopneumatic Device;Taping;Dry needling;Passive range of motion;Ultrasound;Splinting;Energy conservation;Scar mobilization    PT Next Visit Plan  progress RTC and periscapular strengthening to tolerance    Consulted and Agree with Plan of Care  Patient       Patient will benefit from skilled therapeutic intervention in order to improve the following deficits and impairments:  Pain, Impaired UE functional use, Decreased strength, Decreased range of motion, Decreased activity tolerance, Decreased scar mobility, Postural dysfunction, Impaired flexibility  Visit Diagnosis: Acute pain of right shoulder  Stiffness of right shoulder, not elsewhere classified  Other symptoms and signs involving the musculoskeletal system  Muscle  weakness (generalized)     Problem List Patient Active Problem List   Diagnosis Date Noted  . S/P arthroscopy of right shoulder 04/17/2018    Janene Harvey, PT, DPT 09/17/18 5:05 PM   [facility] [address]  [place], [place], [address] Phone: [phone]   Fax:  [phone]  Name: CARSON MECHE MRN: [id number] Date of Birth: 1966-07-12

## 2018-09-24 ENCOUNTER — Ambulatory Visit: Payer: PRIVATE HEALTH INSURANCE | Attending: Nurse Practitioner

## 2018-09-24 DIAGNOSIS — M25511 Pain in right shoulder: Secondary | ICD-10-CM | POA: Insufficient documentation

## 2018-09-24 DIAGNOSIS — M6281 Muscle weakness (generalized): Secondary | ICD-10-CM | POA: Diagnosis present

## 2018-09-24 DIAGNOSIS — R29898 Other symptoms and signs involving the musculoskeletal system: Secondary | ICD-10-CM | POA: Diagnosis present

## 2018-09-24 DIAGNOSIS — M25611 Stiffness of right shoulder, not elsewhere classified: Secondary | ICD-10-CM | POA: Diagnosis present

## 2018-09-24 NOTE — Therapy (Signed)
[facility] [place], [place], [address] Phone: [phone]   Fax:  [phone]  Physical Therapy Treatment  Patient Details  Name: Monica Petty MRN: [id number] Date of Birth: [birth year] Referring Provider (PT): Sydnee Cabal, MD   Encounter Date: 09/24/2018  PT End of Session - 09/24/18 1625    Visit Number  75    Number of Visits  40    Date for PT Re-Evaluation  10/01/18    Authorization Type  WC    Authorization Time Period  6 visits pre-op; 12 visits    Authorization - Visit Number  40    Authorization - Number of Visits  36    PT Start Time  1615    PT Stop Time  1701    PT Time Calculation (min)  46 min    Activity Tolerance  Patient tolerated treatment well;Patient limited by pain    Behavior During Therapy  [facility] for tasks assessed/performed       Past Medical History:  Diagnosis Date  . Anxiety   . Diverticulosis of colon   . GERD (gastroesophageal reflux disease)    takes okra pepsin supplement  . History of concussion    1987  fell of horse--- no residual  . Hypertension   . Migraine    occasional    Past Surgical History:  Procedure Laterality Date  . COLONOSCOPY  06/06/2016  . LAPAROSCOPIC CHOLECYSTECTOMY  06-03-2008   dr Ulyess Blossom  [facility]  . REDUCTION MAMMAPLASTY Bilateral 03-05-2002   dr barber  [facility]  . SHOULDER ARTHROSCOPY WITH ROTATOR CUFF REPAIR AND SUBACROMIAL DECOMPRESSION Right 04/17/2018   Procedure: Right shoulder evaluation under anesthesia, scope, debridement, subacromial decompression, rotator cuff repair;  Surgeon: Sydnee Cabal, MD;  Location: [facility];  Service: Orthopedics;  Laterality: Right;  120 mins General with Intra Scalene Block  . VAGINAL HYSTERECTOMY  02-12-2002   dr Nori Riis  [facility]  . WISDOM TOOTH EXTRACTION  teen    There were no vitals filed for this visit.  Subjective Assessment - 09/24/18 1621    Subjective  Pt. noting primary  complaint today is posterior/inferior shoulder soreness with reaching motions.    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 Maximum retraction is 16.5 mm and the tear is 14 mm wide. Mild tendinopathy involving the infraspinatus tendon probable small intrasubstance tears. The subscapularis tendon is intact.    Patient Stated Goals  keep strengthening and mobility of R shoulder    Currently in Pain?  No/denies    Pain Score  0-No pain   up to 6/10 pain at worst with reaching out to side with IR    Multiple Pain Sites  No                       [facility] Adult PT Treatment/Exercise - 09/24/18 1627      Shoulder Exercises: Supine   Protraction  Both;Strengthening;20 reps    Protraction Weight (lbs)  4#    Other Supine Exercises  R ABC's with yellow med ball x 1 round       Shoulder Exercises: Standing   External Rotation  Strengthening;Theraband;Right;10 reps    Theraband Level (Shoulder External Rotation)  Level 2 (Red)    Internal Rotation  Strengthening;Right;Theraband;Limitations;15 reps    Theraband Level (Shoulder Internal Rotation)  Level 2 (Red)    Flexion  Strengthening;10 reps;Theraband    Theraband Level (Shoulder Flexion)  Level 2 (Red)    Diagonals  Right;10 reps;Theraband    Theraband Level (Shoulder Diagonals)  Level 2 (Red)    Diagonals Limitations  R D1/D2 flexion x 10 reps       Shoulder Exercises: Pulleys   Flexion  2 minutes    Flexion Limitations  to tolerance    Scaption  2 minutes    Scaption Limitations  to tolerance      Shoulder Exercises: ROM/Strengthening   Cybex Row  15 reps    Cybex Row Limitations  20#    Wall Pushups  15 reps    Wall Pushups Limitations  leaning on orange p-ball       Shoulder Exercises: Stretch   Internal Rotation Stretch  2 reps    Internal Rotation Stretch Limitations  2x 20 sec sleeper stretch     External Rotation Stretch  2 reps;30 seconds    cross body stretch with R hand on L shoulder      Manual Therapy   Manual Therapy  Passive ROM    Soft tissue mobilization  STM to R posterior shoulder in prolonged R shoulder IR stretch     Passive ROM  R shld IR stretch x 1 min with posterior shld STM in tender area              PT Education - 09/24/18 1706    Education provided  Yes    Education Details  HEP update    Person(s) Educated  Patient    Methods  Explanation;Demonstration;Verbal cues;Handout    Comprehension  Verbalized understanding;Returned demonstration;Verbal cues required;Need further instruction       PT Short Term Goals - 09/03/18 1624      PT SHORT TERM GOAL #1   Title  Patient to be independent with initial HEP.    Time  3    Period  Weeks    Status  Achieved        PT Long Term Goals - 09/03/18 1624      PT LONG TERM GOAL #1   Title  patient to be independent with advanced HEP    Time  4    Period  Weeks    Status  Partially Met   met for current   Target Date  10/01/18      PT LONG TERM GOAL #2   Title  patient to demonstrate full and non-painful AROM (including multi-planar motions) without pain    Time  4    Period  Weeks    Status  Partially Met   improvements demonstrated in R shoulder AROM in all planes   Target Date  10/01/18      PT LONG TERM GOAL #3   Title  patient to improve R shoulder strength to >/= 4+/5 without pain    Time  6    Period  Weeks    Status  Achieved      PT LONG TERM GOAL #4   Title  Patient to demonstrate overhead reaching to place 5# object on shelf overhead with <=1/10 pain.    Time  4    Period  Weeks    Status  Partially Met   able to reach to overhead shelf with 4lbs with no compensations and good scapulohumeral rhythm   Target Date  10/01/18      PT LONG TERM GOAL #5   Title  Patient to report tolerance of  1 full shift at work without pain.    Time  4    Period  Weeks    Status  Partially Met   reports no R shoulder pain with light  duty; has not returned to full duty   Target Date  10/01/18      PT LONG TERM GOAL #6   Title  Patient to report tolerance of fastening bra without pain.     Time  7    Period  Weeks    Status  Achieved   able to perform without pain           Plan - 09/24/18 1707    Clinical Impression Statement  Ardenia reporting some remaining tenderness in R posterior/inferior shoulder with side reaching + IR motion.  Manual therapy addressing infraspinatus, teres minor musculature with pt. ttp however tolerated STM/DTM well.  Pt. instructed on self-ball massage to this area for use at home.  Able to progress to previously performed resistance with RTC and scapular strengthening activities today which were levels of resistance used before incident with dog over holidays.  Pt. ended visit pain free thus modalities deferred.  HEP updated.  Progressing well toward goals.      Rehab Potential  Good    PT Frequency  1x / week    PT Duration  4 weeks    PT Treatment/Interventions  ADLs/Self Care Home Management;Cryotherapy;Electrical Stimulation;Moist Heat;Therapeutic exercise;Therapeutic activities;Neuromuscular re-education;Patient/family education;Manual techniques;Vasopneumatic Device;Taping;Dry needling;Passive range of motion;Ultrasound;Splinting;Energy conservation;Scar mobilization    PT Next Visit Plan  progress RTC and periscapular strengthening to tolerance    Consulted and Agree with Plan of Care  Patient       Patient will benefit from skilled therapeutic intervention in order to improve the following deficits and impairments:  Pain, Impaired UE functional use, Decreased strength, Decreased range of motion, Decreased activity tolerance, Decreased scar mobility, Postural dysfunction, Impaired flexibility  Visit Diagnosis: Acute pain of right shoulder  Stiffness of right shoulder, not elsewhere classified  Other symptoms and signs involving the musculoskeletal system  Muscle weakness  (generalized)     Problem List Patient Active Problem List   Diagnosis Date Noted  . S/P arthroscopy of right shoulder 04/17/2018    Bess Harvest, PTA 09/24/18 6:13 PM   [facility] [address]  [place], [place], [address] Phone: [phone]   Fax:  [phone]  Name: ANAYELY CONSTANTINE MRN: [id number] Date of Birth: 21-Feb-1966

## 2018-09-27 DIAGNOSIS — L573 Poikiloderma of Civatte: Secondary | ICD-10-CM | POA: Diagnosis not present

## 2018-09-27 DIAGNOSIS — L910 Hypertrophic scar: Secondary | ICD-10-CM | POA: Diagnosis not present

## 2018-09-27 DIAGNOSIS — I781 Nevus, non-neoplastic: Secondary | ICD-10-CM | POA: Diagnosis not present

## 2018-10-01 ENCOUNTER — Encounter: Payer: Self-pay | Admitting: Physical Therapy

## 2018-10-01 ENCOUNTER — Ambulatory Visit: Payer: PRIVATE HEALTH INSURANCE | Admitting: Physical Therapy

## 2018-10-01 DIAGNOSIS — M6281 Muscle weakness (generalized): Secondary | ICD-10-CM

## 2018-10-01 DIAGNOSIS — M25511 Pain in right shoulder: Secondary | ICD-10-CM

## 2018-10-01 DIAGNOSIS — R29898 Other symptoms and signs involving the musculoskeletal system: Secondary | ICD-10-CM

## 2018-10-01 DIAGNOSIS — M25611 Stiffness of right shoulder, not elsewhere classified: Secondary | ICD-10-CM

## 2018-10-01 NOTE — Therapy (Signed)
[facility] [address]  [place], [place], [address] Phone: [phone]   Fax:  [phone]  Physical Therapy Treatment  Patient Details  Name: Monica Petty MRN: [id number] Date of Birth: [birth year] Referring Provider (PT): Sydnee Cabal, MD   Encounter Date: 10/01/2018  PT End of Session - 10/01/18 1657    Visit Number  57    Number of Visits  40    Date for PT Re-Evaluation  10/01/18    Authorization Type  WC    Authorization Time Period  6 visits pre-op; 12 visits    Authorization - Visit Number  65    Authorization - Number of Visits  36    PT Start Time  1694    PT Stop Time  1710    PT Time Calculation (min)  53 min    Activity Tolerance  Patient tolerated treatment well;Patient limited by pain    Behavior During Therapy  [facility] for tasks assessed/performed       Past Medical History:  Diagnosis Date  . Anxiety   . Diverticulosis of colon   . GERD (gastroesophageal reflux disease)    takes okra pepsin supplement  . History of concussion    1987  fell of horse--- no residual  . Hypertension   . Migraine    occasional    Past Surgical History:  Procedure Laterality Date  . COLONOSCOPY  06/06/2016  . LAPAROSCOPIC CHOLECYSTECTOMY  06-03-2008   dr Ulyess Blossom  [facility]  . REDUCTION MAMMAPLASTY Bilateral 03-05-2002   dr barber  [facility]  . SHOULDER ARTHROSCOPY WITH ROTATOR CUFF REPAIR AND SUBACROMIAL DECOMPRESSION Right 04/17/2018   Procedure: Right shoulder evaluation under anesthesia, scope, debridement, subacromial decompression, rotator cuff repair;  Surgeon: Sydnee Cabal, MD;  Location: [facility];  Service: Orthopedics;  Laterality: Right;  120 mins General with Intra Scalene Block  . VAGINAL HYSTERECTOMY  02-12-2002   dr Nori Riis  [facility]  . WISDOM TOOTH EXTRACTION  teen    There were no vitals filed for this visit.  Subjective Assessment - 10/01/18 1619    Subjective  Reports R shoulder  is getting better, however still having burning sensation with certain movements. Notes her pocketbook has been bothering her, and has switched arms as a result.     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 Maximum retraction is 16.5 mm and the tear is 14 mm wide. Mild tendinopathy involving the infraspinatus tendon probable small intrasubstance tears. The subscapularis tendon is intact.    Patient Stated Goals  keep strengthening and mobility of R shoulder    Currently in Pain?  No/denies                       [facility] Adult PT Treatment/Exercise - 10/01/18 0001      Therapeutic Activites    Therapeutic Activities  Lifting    Lifting  simulation of bed mobility and transfering patients with therapist acting as patient   reporting "feeling it" in R shoulder     Shoulder Exercises: Sidelying   External Rotation  Strengthening;Right;10 reps;Limitations;Weights   unable tot otlerate 2#   External Rotation Weight (lbs)  1,2    External Rotation Limitations  2x10; dowel under elbow to maintain neutral      Shoulder Exercises: Standing   Horizontal ABduction  Strengthening;Both;Theraband;Limitations;10 reps    Theraband Level (Shoulder Horizontal ABduction)  Level 1 (Yellow)    Horizontal ABduction Limitations  unable to tolerate red TB d/t burning in posterior shoulder    Other Standing Exercises  B shoulder ER 2x10; 1st set yellow, 2nd set red   cues to slow down     Shoulder Exercises: ROM/Strengthening   UBE (Upper Arm Bike)  Lvl 1.5, 3 min forwards/back      Shoulder Exercises: Stretch   Other Shoulder Stretches  R shoulder IR stretch with strap 2x5x5" to tolerance      Vasopneumatic   Number Minutes Vasopneumatic   15 minutes    Vasopnuematic Location   Shoulder   R   Vasopneumatic Pressure  Low    Vasopneumatic Temperature   Coldest      Manual Therapy   Manual Therapy  Soft tissue  mobilization    Soft tissue mobilization  STM to R infraspinatus/teres group    Passive ROM  R shoulder IR and ER to tolerance with gentle distractoin             PT Education - 10/01/18 1656    Education provided  Yes    Education Details  advised to continue progressing HEP as tolerated    Person(s) Educated  Patient    Methods  Explanation;Demonstration    Comprehension  Verbalized understanding;Returned demonstration       PT Short Term Goals - 09/03/18 1624      PT SHORT TERM GOAL #1   Title  Patient to be independent with initial HEP.    Time  3    Period  Weeks    Status  Achieved        PT Long Term Goals - 09/03/18 1624      PT LONG TERM GOAL #1   Title  patient to be independent with advanced HEP    Time  4    Period  Weeks    Status  Partially Met   met for current   Target Date  10/01/18      PT LONG TERM GOAL #2   Title  patient to demonstrate full and non-painful AROM (including multi-planar motions) without pain    Time  4    Period  Weeks    Status  Partially Met   improvements demonstrated in R shoulder AROM in all planes   Target Date  10/01/18      PT LONG TERM GOAL #3   Title  patient to improve R shoulder strength to >/= 4+/5 without pain    Time  6    Period  Weeks    Status  Achieved      PT LONG TERM GOAL #4   Title  Patient to demonstrate overhead reaching to place 5# object on shelf overhead with <=1/10 pain.    Time  4    Period  Weeks    Status  Partially Met   able to reach to overhead shelf with 4lbs with no compensations and good scapulohumeral rhythm   Target Date  10/01/18      PT LONG TERM GOAL #5   Title  Patient to report tolerance of 1 full shift at work without pain.    Time  4    Period  Weeks    Status  Partially Met   reports no R shoulder pain with light duty; has not returned to full duty   Target Date  10/01/18      PT LONG TERM GOAL #6   Title  Patient to report tolerance of fastening bra without  pain.     Time  7    Period  Weeks    Status  Achieved   able to perform without pain           Plan - 10/01/18 1719    Clinical Impression Statement  Patient arrived to session with report of mildly improved R shoulder pain, however still having burning sensation with certain movements. 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 Patient reported burning and discomfort with STM to infraspinatus/teres group, however noted relief with pressure. Worked on IR stretching as patient noting "tightness" in this direction. Limited in tolerance with sidelying ER- requiring dropping down to 1lb from 2lbs. Ended session with Gameready to R shoulder for pain relief. No complaints at end of session. Patient noting several elements of work duties that she has R shoulder pain and discomfort with. Advised patient to speak to her MD about these concerns before last session next week. Patient reported understanding.     PT Treatment/Interventions  ADLs/Self Care Home Management;Cryotherapy;Electrical Stimulation;Moist Heat;Therapeutic exercise;Therapeutic activities;Neuromuscular re-education;Patient/family education;Manual techniques;Vasopneumatic Device;Taping;Dry needling;Passive range of motion;Ultrasound;Splinting;Energy conservation;Scar mobilization    PT Next Visit Plan  progress RTC and periscapular strengthening to tolerance    Consulted and Agree with Plan of Care  Patient       Patient will benefit from skilled therapeutic intervention in order to improve the following deficits and impairments:  Pain, Impaired UE functional use, Decreased strength, Decreased range of motion, Decreased activity tolerance, Decreased scar mobility, Postural dysfunction, Impaired flexibility  Visit Diagnosis: Acute pain of right shoulder  Stiffness of right shoulder, not  elsewhere classified  Other symptoms and signs involving the musculoskeletal system  Muscle weakness (generalized)     Problem List Patient Active Problem List   Diagnosis Date Noted  . S/P arthroscopy of right shoulder 04/17/2018    Janene Harvey, PT, DPT 10/01/18 5:39 PM   [facility] [address]  [place], [place], [address] Phone: [phone]   Fax:  [phone]  Name: Monica Petty MRN: [id number] Date of Birth: 10-22-1965

## 2018-10-08 ENCOUNTER — Encounter: Payer: Self-pay | Admitting: Physical Therapy

## 2018-10-08 ENCOUNTER — Ambulatory Visit: Payer: PRIVATE HEALTH INSURANCE | Admitting: Physical Therapy

## 2018-10-08 DIAGNOSIS — M25511 Pain in right shoulder: Secondary | ICD-10-CM

## 2018-10-08 DIAGNOSIS — M25611 Stiffness of right shoulder, not elsewhere classified: Secondary | ICD-10-CM

## 2018-10-08 DIAGNOSIS — R29898 Other symptoms and signs involving the musculoskeletal system: Secondary | ICD-10-CM

## 2018-10-08 DIAGNOSIS — M6281 Muscle weakness (generalized): Secondary | ICD-10-CM

## 2018-10-08 NOTE — Therapy (Signed)
[facility] [place], [place], [address] Phone: [phone]   Fax:  [phone]  Physical Therapy Treatment  Patient Details  Name: Monica Petty MRN: [id number] Date of Birth: [birth year] Referring Provider (PT): Sydnee Cabal, MD   Encounter Date: 10/08/2018  PT End of Session - 10/08/18 1759    Visit Number  40    Number of Visits  40    Date for PT Re-Evaluation  10/01/18    Authorization Type  WC    Authorization Time Period  6 visits pre-op; 12 visits    Authorization - Visit Number  23    Authorization - Number of Visits  36    PT Start Time  1616    PT Stop Time  1707    PT Time Calculation (min)  51 min    Activity Tolerance  Patient tolerated treatment well;Patient limited by pain    Behavior During Therapy  [facility] for tasks assessed/performed       Past Medical History:  Diagnosis Date  . Anxiety   . Diverticulosis of colon   . GERD (gastroesophageal reflux disease)    takes okra pepsin supplement  . History of concussion    1987  fell of horse--- no residual  . Hypertension   . Migraine    occasional    Past Surgical History:  Procedure Laterality Date  . COLONOSCOPY  06/06/2016  . LAPAROSCOPIC CHOLECYSTECTOMY  06-03-2008   dr Ulyess Blossom  [facility]  . REDUCTION MAMMAPLASTY Bilateral 03-05-2002   dr barber  [facility]  . SHOULDER ARTHROSCOPY WITH ROTATOR CUFF REPAIR AND SUBACROMIAL DECOMPRESSION Right 04/17/2018   Procedure: Right shoulder evaluation under anesthesia, scope, debridement, subacromial decompression, rotator cuff repair;  Surgeon: Sydnee Cabal, MD;  Location: [facility];  Service: Orthopedics;  Laterality: Right;  120 mins General with Intra Scalene Block  . VAGINAL HYSTERECTOMY  02-12-2002   dr Nori Riis  [facility]  . WISDOM TOOTH EXTRACTION  teen    There were no vitals filed for this visit.  Subjective Assessment - 10/08/18 1618    Subjective  Reports she spoke  with her case manager who reassured her that at her next MD appointment her MD may put her into a work conditioning program until she is ready to transition to work. Reports she would be comfortable being placed on 30 dya hold with PT. Reports 75-85% improvement, Would like to continue improving her strength at home.     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 Maximum retraction is 16.5 mm and the tear is 14 mm wide. Mild tendinopathy involving the infraspinatus tendon probable small intrasubstance tears. The subscapularis tendon is intact.    Patient Stated Goals  keep strengthening and mobility of R shoulder    Currently in Pain?  No/denies         [facility] PT Assessment - 10/08/18 0001      AROM   Right/Left Shoulder  Right    Right Shoulder Flexion  160 Degrees    Right Shoulder ABduction  125 Degrees    Right Shoulder Internal Rotation  51 Degrees    Right Shoulder External Rotation  74 Degrees      PROM   Right/Left Shoulder  Right    Right Shoulder Flexion  146 Degrees    Right Shoulder ABduction  157 Degrees    Right Shoulder Internal Rotation  96 Degrees    Right Shoulder External Rotation  75 Degrees      Strength   Right/Left Shoulder  Right    Right Shoulder Flexion  4/5    Right Shoulder ABduction  4/5    Right Shoulder Internal Rotation  4-/5    Right Shoulder External Rotation  3+/5                   [facility] Adult PT Treatment/Exercise - 10/08/18 0001      Shoulder Exercises: Seated   Horizontal ABduction  Strengthening;Both;10 reps;Theraband    Theraband Level (Shoulder Horizontal ABduction)  Level 1 (Yellow)    Horizontal ABduction Weight (lbs)  cues to avoid shoulder elevation and perform within limited range    External Rotation  Strengthening;Both;15 reps;Theraband    Theraband Level (Shoulder External Rotation)  Level 3 (Green)    External Rotation Weight (lbs)  good tolerance     Flexion  Strengthening;Right;10 reps;Theraband    Theraband Level (Shoulder Flexion)  Level 1 (Yellow)    Flexion Limitations  cues to avoid shoulder elevation    Other Seated Exercises  R shoulder scaption with yellow TB x10      Shoulder Exercises: ROM/Strengthening   UBE (Upper Arm Bike)  Lvl 2.0, 3 min forwards/back      Shoulder Exercises: Stretch   Other Shoulder Stretches  R shoulder IR stretch with strap 10x5" to tolerance    External Rotation Stretch Limitations  R shoulder ER stretch in doorway 5x5" to tolerance      Vasopneumatic   Number Minutes Vasopneumatic   10 minutes    Vasopnuematic Location   Shoulder   R   Vasopneumatic Pressure  Low    Vasopneumatic Temperature   Coldest             PT Education - 10/08/18 1758    Education provided  Yes    Education Details  update to HEP; administered yellow TB    Person(s) Educated  Patient    Methods  Explanation;Demonstration;Tactile cues;Verbal cues;Handout    Comprehension  Verbalized understanding;Returned demonstration       PT Short Term Goals - 10/08/18 1624      PT SHORT TERM GOAL #1   Title  Patient to be independent with initial HEP.    Time  3    Period  Weeks    Status  Achieved        PT Long Term Goals - 10/08/18 1624      PT LONG TERM GOAL #1   Title  patient to be independent with advanced HEP    Time  4    Period  Weeks    Status  Achieved      PT LONG TERM GOAL #2   Title  patient to demonstrate full and non-painful AROM (including multi-planar motions) without pain    Time  4    Period  Weeks    Status  Partially Met   improvements demonstrated in R shoulder flexion AROM, abduction, IR, ER PROM     PT LONG TERM GOAL #3   Title  patient to improve R shoulder strength to >/= 4+/5 without pain    Time  6    Period  Weeks    Status  Partially Met   strength decreased in all planes d/t pain limiting     PT LONG TERM GOAL #4   Title  Patient to demonstrate overhead reaching  to place 5# object  on shelf overhead with <=1/10 pain.    Time  4    Period  Weeks    Status  Partially Met   able to reach to overhead shelf with 2lbs with no compensations and good scapulohumeral rhythm, unable to tolerate 3#     PT LONG TERM GOAL #5   Title  Patient to report tolerance of 1 full shift at work without pain.    Time  4    Period  Weeks    Status  Partially Met   reports no R shoulder pain with light duty; has not returned to full duty     PT [place] #6   Title  Patient to report tolerance of fastening bra without pain.     Time  7    Period  Weeks    Status  Achieved   able to perform without pain           Plan - 10/08/18 1806    Clinical Impression Statement  Patient arrived to session with report of 75-85% improvement in R shoulder since initial eval post-op. Notes that she still has not returned to PLOF- before recent flare up. Notes she still would like to keep working on strengthening at home, but is ready to transition to home program at this time. Patient has met or partially met all goals. Improvements demonstrated in R shoulder flexion AROM, abduction, IR, ER PROM. D/t recent flare up, R shoulder strength has decreased in all planes secondary to pain limiting. Patient is able to reach to overhead shelf with 2lbs with no compensations and good scapulohumeral rhythm, however unable to tolerate 3#, or 4# as was previously tolerated. Updated HEP with current exercises to target current impairments. Patient reported understanding. Ended session with Gameready for pain relief. Patient is independent with HEP and requesting to transition to home program. At this time, she is still limited in strength d/t pain from recent flare up. Placing patient on 30 day hold.    PT Treatment/Interventions  ADLs/Self Care Home Management;Cryotherapy;Electrical Stimulation;Moist Heat;Therapeutic exercise;Therapeutic activities;Neuromuscular re-education;Patient/family  education;Manual techniques;Vasopneumatic Device;Taping;Dry needling;Passive range of motion;Ultrasound;Splinting;Energy conservation;Scar mobilization    PT Next Visit Plan  30 day hold    Consulted and Agree with Plan of Care  Patient       Patient will benefit from skilled therapeutic intervention in order to improve the following deficits and impairments:  Pain, Impaired UE functional use, Decreased strength, Decreased range of motion, Decreased activity tolerance, Decreased scar mobility, Postural dysfunction, Impaired flexibility  Visit Diagnosis: Acute pain of right shoulder  Stiffness of right shoulder, not elsewhere classified  Other symptoms and signs involving the musculoskeletal system  Muscle weakness (generalized)     Problem List Patient Active Problem List   Diagnosis Date Noted  . S/P arthroscopy of right shoulder 04/17/2018    Janene Harvey, PT, DPT 10/08/18 6:07 PM   [facility] [address]  [place], [place], [address] Phone: [phone]   Fax:  [phone]  Name: Monica Petty MRN: [id number] Date of Birth: [birth year]

## 2018-10-18 ENCOUNTER — Ambulatory Visit: Payer: PRIVATE HEALTH INSURANCE | Admitting: Physical Therapy

## 2018-10-18 ENCOUNTER — Encounter: Payer: Self-pay | Admitting: Physical Therapy

## 2018-10-18 DIAGNOSIS — R29898 Other symptoms and signs involving the musculoskeletal system: Secondary | ICD-10-CM

## 2018-10-18 DIAGNOSIS — M25611 Stiffness of right shoulder, not elsewhere classified: Secondary | ICD-10-CM

## 2018-10-18 DIAGNOSIS — M6281 Muscle weakness (generalized): Secondary | ICD-10-CM

## 2018-10-18 DIAGNOSIS — M25511 Pain in right shoulder: Secondary | ICD-10-CM

## 2018-10-18 NOTE — Therapy (Signed)
[facility] [address]  [place], [place], [address] Phone: [phone]   Fax:  [phone]  Physical Therapy Treatment  Patient Details  Name: Monica Petty MRN: [id number] Date of Birth: May 17, 1966 Referring Provider (PT): Sydnee Cabal, MD   Encounter Date: 10/18/2018  PT End of Session - 10/18/18 1801    Visit Number  41    Number of Visits  31    Date for PT Re-Evaluation  11/29/18    Authorization Type  WC    Authorization Time Period  addition of 2x/week for 6 weeks     Authorization - Visit Number  52    Authorization - Number of Visits  48    PT Start Time  1616    PT Stop Time  1700    PT Time Calculation (min)  44 min    Activity Tolerance  Patient tolerated treatment well;Patient limited by pain    Behavior During Therapy  [facility] for tasks assessed/performed       Past Medical History:  Diagnosis Date  . Anxiety   . Diverticulosis of colon   . GERD (gastroesophageal reflux disease)    takes okra pepsin supplement  . History of concussion    1987  fell of horse--- no residual  . Hypertension   . Migraine    occasional    Past Surgical History:  Procedure Laterality Date  . COLONOSCOPY  06/06/2016  . LAPAROSCOPIC CHOLECYSTECTOMY  06-03-2008   dr Ulyess Blossom  [facility]  . REDUCTION MAMMAPLASTY Bilateral 03-05-2002   dr barber  [facility]  . SHOULDER ARTHROSCOPY WITH ROTATOR CUFF REPAIR AND SUBACROMIAL DECOMPRESSION Right 04/17/2018   Procedure: Right shoulder evaluation under anesthesia, scope, debridement, subacromial decompression, rotator cuff repair;  Surgeon: Sydnee Cabal, MD;  Location: [facility];  Service: Orthopedics;  Laterality: Right;  120 mins General with Intra Scalene Block  . VAGINAL HYSTERECTOMY  02-12-2002   dr Nori Riis  [facility]  . WISDOM TOOTH EXTRACTION  teen    There were no vitals filed for this visit.  Subjective Assessment - 10/18/18 1617    Subjective  Patient  reports that PA recommended 2x/6 weeks of PT and if pain does not resolve may need another MRI. Lifting restriction is 20lbs. Patient is concerned about lifting patients at work.     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 Maximum retraction is 16.5 mm and the tear is 14 mm wide. Mild tendinopathy involving the infraspinatus tendon probable small intrasubstance tears. The subscapularis tendon is intact.    Patient Stated Goals  keep strengthening and mobility of R shoulder    Currently in Pain?  No/denies         [facility] PT Assessment - 10/18/18 0001      Assessment   Medical Diagnosis  Traumatic rupture of RTC (R)    Referring Provider (PT)  Sydnee Cabal, MD    Onset Date/Surgical Date  04/17/18      AROM   Right/Left Shoulder  Right    Right Shoulder Flexion  151 Degrees    Right Shoulder ABduction  143 Degrees    Right Shoulder Internal Rotation  57 Degrees    Right Shoulder External Rotation  62 Degrees      PROM   Right/Left Shoulder  Right    Right Shoulder Flexion  139 Degrees    Right Shoulder ABduction  145 Degrees  Right Shoulder Internal Rotation  82 Degrees    Right Shoulder External Rotation  60 Degrees      Strength   Right/Left Shoulder  Right    Right Shoulder Flexion  4/5    Right Shoulder ABduction  4/5    Right Shoulder Internal Rotation  4/5    Right Shoulder External Rotation  4-/5                   [facility] Adult PT Treatment/Exercise - 10/18/18 0001      Shoulder Exercises: Supine   Protraction  Both;Strengthening;15 reps    Protraction Weight (lbs)  2#    Protraction Limitations  2x15    Flexion  AAROM;Right;10 reps    Flexion Limitations  with wand to tolerance      Shoulder Exercises: Seated   Flexion  Strengthening;Right;10 reps;Weights    Flexion Weight (lbs)  1    Flexion Limitations  unable to tolerate 2 or 3 lbs    Other Seated Exercises  R shoulder  scaption with 1# to ~120 deg      Shoulder Exercises: Sidelying   Internal Rotation  Strengthening;Right;10 reps;Weights    Internal Rotation Weight (lbs)  1    Internal Rotation Limitations  dowel under elbow with cues for slight scap retraction   loud non-painful pop in shoulder   ABduction  Strengthening;Right;10 reps;Weights;Limitations    ABduction Weight (lbs)  1    ABduction Limitations  to end range with good control      Shoulder Exercises: Standing   External Rotation  Strengthening;Theraband;Right;10 reps    Theraband Level (Shoulder External Rotation)  Level 1 (Yellow)    External Rotation Limitations  dowel under elbow    Internal Rotation  Strengthening;Right;Theraband;Limitations;15 reps    Theraband Level (Shoulder Internal Rotation)  Level 1 (Yellow)    Internal Rotation Limitations  dowel under elbow      Shoulder Exercises: Pulleys   Flexion  3 minutes    Flexion Limitations  to tolerance    Scaption  3 minutes    Scaption Limitations  to tolerance      Manual Therapy   Manual Therapy  Soft tissue mobilization    Soft tissue mobilization  STM to R infraspinatus/teres group             PT Education - 10/18/18 1801    Education provided  Yes    Education Details  update to [employer]) Educated  Patient    Methods  Explanation;Demonstration;Tactile cues;Verbal cues;Handout    Comprehension  Verbalized understanding;Returned demonstration       PT Short Term Goals - 10/18/18 1621      PT SHORT TERM GOAL #1   Title  Patient to be independent with initial HEP.    Time  3    Period  Weeks    Status  Achieved        PT Long Term Goals - 10/18/18 1621      PT LONG TERM GOAL #1   Title  patient to be independent with advanced HEP    Time  6    Period  Weeks    Status  Partially Met   met for current   Target Date  11/29/18      PT LONG TERM GOAL #2   Title  patient to demonstrate full and non-painful AROM (including multi-planar  motions) without pain    Time  6    Period  Weeks    Status  Partially Met   improvements demonstrated in R shoulder abduction and ER AROM, flexion and IR decreased   Target Date  11/29/18      PT LONG TERM GOAL #3   Title  patient to improve R shoulder strength to >/= 4+/5 without pain    Time  6    Period  Weeks    Status  Partially Met   strength improved in ER and IR   Target Date  11/29/18      PT LONG TERM GOAL #4   Title  Patient to demonstrate overhead reaching to place 5# object on shelf overhead with <=1/10 pain.    Time  6    Period  Weeks    Status  Partially Met   able to reach to overhead shelf with 1lbs with no compensations and good scapulohumeral rhythm, unable to tolerate 2 or 3#   Target Date  11/29/18      PT LONG TERM GOAL #5   Title  Patient to report tolerance of 1 full shift at work without pain.    Time  6    Period  Weeks    Status  Partially Met   reports no R shoulder pain with light duty; has not returned to full duty   Target Date  11/29/18      PT LONG TERM GOAL #6   Title  Patient to report tolerance of fastening bra without pain.     Time  7    Period  Weeks    Status  Achieved   able to perform without pain           Plan - 10/18/18 1811    Clinical Impression Statement  Patient returns to PT after MD appointment, who recommended continuing PT to address patient's remaining impairments as patient still unable to return to work at this time d/t weakness and pain. Placed on 20 lbs lifting restriction. Improvements demonstrated in R shoulder abduction and ER AROM, however flexion and IR decreased since last measurement. Patient also limited in R shoulder PROM. Strength testing improved in R shoulder ER and IR. Patient demonstrated ability to reach to overhead shelf with 1lbs with no compensations and good scapulohumeral rhythm, unable to tolerate 2 or 3# at this time d/t pain. Patient reported that she feels she "over-did it" with HEP  exercises at home, admits she has performed all previous exercises despite being instructed to perform a select few at last session. Updated HEP and advised patient to cut down on exercises to avoid over-exerting and flaring up her shoulder. Patient reported understanding. Tolerated today's session well, with no complaints and no need for modalities. Would benefit from continued skilled PT services 2x/week for 6 weeks to address remaining impairments and return to full duty.     PT Frequency  2x / week    PT Duration  6 weeks    PT Treatment/Interventions  ADLs/Self Care Home Management;Cryotherapy;Electrical Stimulation;Moist Heat;Therapeutic exercise;Therapeutic activities;Neuromuscular re-education;Patient/family education;Manual techniques;Vasopneumatic Device;Taping;Dry needling;Passive range of motion;Ultrasound;Splinting;Energy conservation;Scar mobilization    PT Next Visit Plan  progress R shoudler strength and ROM per tolerance    Consulted and Agree with Plan of Care  Patient       Patient will benefit from skilled therapeutic intervention in order to improve the following deficits and impairments:  Pain, Impaired UE functional use, Decreased strength, Decreased range of motion, Decreased activity tolerance, Decreased scar mobility, Postural dysfunction, Impaired flexibility  Visit  Diagnosis: Acute pain of right shoulder  Stiffness of right shoulder, not elsewhere classified  Other symptoms and signs involving the musculoskeletal system  Muscle weakness (generalized)     Problem List Patient Active Problem List   Diagnosis Date Noted  . S/P arthroscopy of right shoulder 04/17/2018     Janene Harvey, PT, DPT 10/18/18 6:13 PM   [facility] [address]  [place], [place], [address] Phone: [phone]   Fax:  [phone]  Name: Monica Petty MRN: [id number] Date of Birth: 09-01-1966

## 2018-10-22 ENCOUNTER — Encounter: Payer: Self-pay | Admitting: Physical Therapy

## 2018-10-22 ENCOUNTER — Ambulatory Visit: Payer: PRIVATE HEALTH INSURANCE | Attending: Nurse Practitioner | Admitting: Physical Therapy

## 2018-10-22 DIAGNOSIS — M25611 Stiffness of right shoulder, not elsewhere classified: Secondary | ICD-10-CM | POA: Diagnosis present

## 2018-10-22 DIAGNOSIS — M6281 Muscle weakness (generalized): Secondary | ICD-10-CM | POA: Insufficient documentation

## 2018-10-22 DIAGNOSIS — R29898 Other symptoms and signs involving the musculoskeletal system: Secondary | ICD-10-CM | POA: Insufficient documentation

## 2018-10-22 DIAGNOSIS — M25511 Pain in right shoulder: Secondary | ICD-10-CM | POA: Diagnosis not present

## 2018-10-22 NOTE — Therapy (Signed)
[facility] [address] [address] [place], [place], [address] Phone: [phone]   Fax:  [phone]  Physical Therapy Treatment  Patient Details  Name: Monica Petty MRN: [id number] Date of Birth: 19-May-1966 Referring Provider (PT): Sydnee Cabal, MD   Encounter Date: 10/22/2018  PT End of Session - 10/22/18 1527    Visit Number  42    Number of Visits  62    Date for PT Re-Evaluation  11/29/18    Authorization Type  WC    Authorization Time Period  addition of 2x/week for 6 weeks     Authorization - Visit Number  49    Authorization - Number of Visits  97    PT Start Time  1446    PT Stop Time  1527    PT Time Calculation (min)  41 min    Activity Tolerance  Patient tolerated treatment well    Behavior During Therapy  [facility] for tasks assessed/performed       Past Medical History:  Diagnosis Date  . Anxiety   . Diverticulosis of colon   . GERD (gastroesophageal reflux disease)    takes okra pepsin supplement  . History of concussion    1987  fell of horse--- no residual  . Hypertension   . Migraine    occasional    Past Surgical History:  Procedure Laterality Date  . COLONOSCOPY  06/06/2016  . LAPAROSCOPIC CHOLECYSTECTOMY  06-03-2008   dr Ulyess Blossom  [facility]  . REDUCTION MAMMAPLASTY Bilateral 03-05-2002   dr barber  [facility]  . SHOULDER ARTHROSCOPY WITH ROTATOR CUFF REPAIR AND SUBACROMIAL DECOMPRESSION Right 04/17/2018   Procedure: Right shoulder evaluation under anesthesia, scope, debridement, subacromial decompression, rotator cuff repair;  Surgeon: Sydnee Cabal, MD;  Location: [facility];  Service: Orthopedics;  Laterality: Right;  120 mins General with Intra Scalene Block  . VAGINAL HYSTERECTOMY  02-12-2002   dr Nori Riis  [facility]  . WISDOM TOOTH EXTRACTION  teen    There were no vitals filed for this visit.  Subjective Assessment - 10/22/18 1447    Subjective  Reports that she has been taking it a  bit easier lately and has been better.     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 Maximum retraction is 16.5 mm and the tear is 14 mm wide. Mild tendinopathy involving the infraspinatus tendon probable small intrasubstance tears. The subscapularis tendon is intact.    Patient Stated Goals  keep strengthening and mobility of R shoulder    Currently in Pain?  No/denies                       [facility] Adult PT Treatment/Exercise - 10/22/18 0001      Shoulder Exercises: Supine   Protraction  Both;Strengthening;15 reps    Protraction Weight (lbs)  3#    Protraction Limitations  2x15      Shoulder Exercises: Seated   Flexion  Strengthening;Right;10 reps;Weights    Flexion Weight (lbs)  1    Flexion Limitations  good ROM and control    Abduction  Strengthening;Right;10 reps;Weights    ABduction Weight (lbs)  1    ABduction Limitations  to ~120 deg with good tolerance and mechanics    Other Seated Exercises  R shoulder scaption with 1# to ~140 deg      Shoulder Exercises: Sidelying   External Rotation  Strengthening;Right;10 reps;Limitations;Weights    External Rotation Weight (lbs)  1    External Rotation Limitations  2x10; dowel under elbow to maintain neutral    Flexion  Strengthening;AROM;Right;10 reps   cues for mild scap retraction   Flexion Limitations  mild R posterior shoulder pain at ~120 deg    ABduction  Strengthening;Right;10 reps;Weights;Limitations    ABduction Weight (lbs)  1    ABduction Limitations  to end range with good control      Shoulder Exercises: Standing   External Rotation  Strengthening;Theraband;Right;10 reps   cues to stop at neutral   Theraband Level (Shoulder External Rotation)  Level 1 (Yellow)    External Rotation Limitations  dowel under elbow    Internal Rotation  Strengthening;Right;Theraband;Limitations;15 reps    Theraband Level (Shoulder Internal Rotation)   Level 1 (Yellow)    Internal Rotation Limitations  dowel under elbow    Row  Strengthening;Both;Theraband;15 reps    Theraband Level (Shoulder Row)  Level 2 (Red);Level 3 (Green)    Row Limitations  2x15; good form      Shoulder Exercises: Pulleys   Flexion  3 minutes    Flexion Limitations  cues to bring elbows in    Scaption  3 minutes    Scaption Limitations  to tolerance      Manual Therapy   Joint Mobilization  gentle R shoulder distraction with PROM for better tolerance    Passive ROM  R shoulder PROM in all planes             PT Education - 10/22/18 1527    Education provided  Yes    Education Details  review of most current HEP handouts    Person(s) Educated  Patient    Methods  Explanation;Demonstration;Tactile cues;Verbal cues;Handout    Comprehension  Verbalized understanding;Returned demonstration       PT Short Term Goals - 10/18/18 1621      PT SHORT TERM GOAL #1   Title  Patient to be independent with initial HEP.    Time  3    Period  Weeks    Status  Achieved        PT Long Term Goals - 10/18/18 1621      PT LONG TERM GOAL #1   Title  patient to be independent with advanced HEP    Time  6    Period  Weeks    Status  Partially Met   met for current   Target Date  11/29/18      PT LONG TERM GOAL #2   Title  patient to demonstrate full and non-painful AROM (including multi-planar motions) without pain    Time  6    Period  Weeks    Status  Partially Met   improvements demonstrated in R shoulder abduction and ER AROM, flexion and IR decreased   Target Date  11/29/18      PT LONG TERM GOAL #3   Title  patient to improve R shoulder strength to >/= 4+/5 without pain    Time  6    Period  Weeks    Status  Partially Met   strength improved in ER and IR   Target Date  11/29/18      PT LONG TERM GOAL #4   Title  Patient to demonstrate overhead reaching to place 5# object on shelf overhead with <=1/10 pain.    Time  6    Period  Weeks  Status  Partially Met   able to reach to overhead shelf with 1lbs with no compensations and good scapulohumeral rhythm, unable to tolerate 2 or 3#   Target Date  11/29/18      PT LONG TERM GOAL #5   Title  Patient to report tolerance of 1 full shift at work without pain.    Time  6    Period  Weeks    Status  Partially Met   reports no R shoulder pain with light duty; has not returned to full duty   Target Date  11/29/18      PT LONG TERM GOAL #6   Title  Patient to report tolerance of fastening bra without pain.     Time  7    Period  Weeks    Status  Achieved   able to perform without pain           Plan - 10/22/18 1528    Clinical Impression Statement  Patient arrived to session with report that she has been taking it a bit easier since last session, and has been feeling better. Tolerated PROM and gentle distraction to R shoulder at beginning of session. Able to progress serratus punches without increased pain. Worked on sidelying shoulder flexion, ER, and abduction- patient reporting mild posterior shoulder pain at ~120 degrees of flexion but able to continue. Good shoulder mechanics and motor control with sitting exercises with light weighted resistance. Did require cues to stop at neutral with IR with banded resistance. Ended session with no complaints. Patient progressing towards goals.     PT Treatment/Interventions  ADLs/Self Care Home Management;Cryotherapy;Electrical Stimulation;Moist Heat;Therapeutic exercise;Therapeutic activities;Neuromuscular re-education;Patient/family education;Manual techniques;Vasopneumatic Device;Taping;Dry needling;Passive range of motion;Ultrasound;Splinting;Energy conservation;Scar mobilization    PT Next Visit Plan  progress R shoudler strength and ROM per tolerance    Consulted and Agree with Plan of Care  Patient       Patient will benefit from skilled therapeutic intervention in order to improve the following deficits and impairments:   Pain, Impaired UE functional use, Decreased strength, Decreased range of motion, Decreased activity tolerance, Decreased scar mobility, Postural dysfunction, Impaired flexibility  Visit Diagnosis: Acute pain of right shoulder  Stiffness of right shoulder, not elsewhere classified  Other symptoms and signs involving the musculoskeletal system  Muscle weakness (generalized)     Problem List Patient Active Problem List   Diagnosis Date Noted  . S/P arthroscopy of right shoulder 04/17/2018    Janene Harvey, PT, DPT 10/22/18 3:31 PM   [facility] [address]  [place], [place], [address] Phone: [phone]   Fax:  [phone]  Name: Monica Petty MRN: [id number] Date of Birth: 1965/09/22

## 2018-10-25 ENCOUNTER — Encounter: Payer: Self-pay | Admitting: Physical Therapy

## 2018-10-25 ENCOUNTER — Ambulatory Visit: Payer: PRIVATE HEALTH INSURANCE | Admitting: Physical Therapy

## 2018-10-25 DIAGNOSIS — M25511 Pain in right shoulder: Secondary | ICD-10-CM | POA: Diagnosis not present

## 2018-10-25 DIAGNOSIS — M25611 Stiffness of right shoulder, not elsewhere classified: Secondary | ICD-10-CM

## 2018-10-25 DIAGNOSIS — M6281 Muscle weakness (generalized): Secondary | ICD-10-CM

## 2018-10-25 DIAGNOSIS — R29898 Other symptoms and signs involving the musculoskeletal system: Secondary | ICD-10-CM

## 2018-10-25 NOTE — Therapy (Signed)
[facility] [place] [address]  [place], [place], [address] Phone: [phone]   Fax:  [phone]  Physical Therapy Treatment  Patient Details  Name: Monica Petty MRN: [id number] Date of Birth: March 15, 1966 Referring Provider (PT): Sydnee Cabal, MD   Encounter Date: 10/25/2018  PT End of Session - 10/25/18 1402    Visit Number  43    Number of Visits  63    Date for PT Re-Evaluation  11/29/18    Authorization Type  WC    Authorization Time Period  addition of 2x/week for 6 weeks     Authorization - Visit Number  64    Authorization - Number of Visits  48    PT Start Time  1314    PT Stop Time  1414    PT Time Calculation (min)  60 min    Activity Tolerance  Patient tolerated treatment well;Patient limited by pain    Behavior During Therapy  [facility] for tasks assessed/performed       Past Medical History:  Diagnosis Date  . Anxiety   . Diverticulosis of colon   . GERD (gastroesophageal reflux disease)    takes okra pepsin supplement  . History of concussion    1987  fell of horse--- no residual  . Hypertension   . Migraine    occasional    Past Surgical History:  Procedure Laterality Date  . COLONOSCOPY  06/06/2016  . LAPAROSCOPIC CHOLECYSTECTOMY  06-03-2008   dr Ulyess Blossom  [facility]  . REDUCTION MAMMAPLASTY Bilateral 03-05-2002   dr barber  [facility]  . SHOULDER ARTHROSCOPY WITH ROTATOR CUFF REPAIR AND SUBACROMIAL DECOMPRESSION Right 04/17/2018   Procedure: Right shoulder evaluation under anesthesia, scope, debridement, subacromial decompression, rotator cuff repair;  Surgeon: Sydnee Cabal, MD;  Location: [facility];  Service: Orthopedics;  Laterality: Right;  120 mins General with Intra Scalene Block  . VAGINAL HYSTERECTOMY  02-12-2002   dr Nori Riis  [facility]  . WISDOM TOOTH EXTRACTION  teen    There were no vitals filed for this visit.  Subjective Assessment - 10/25/18 1315    Subjective  Reports no new  issues. Has been compliant with latest HEP. Notes that she has discomfort with pushing open a heavy door.     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 Maximum retraction is 16.5 mm and the tear is 14 mm wide. Mild tendinopathy involving the infraspinatus tendon probable small intrasubstance tears. The subscapularis tendon is intact.    Patient Stated Goals  keep strengthening and mobility of R shoulder    Currently in Pain?  No/denies                       [facility] Adult PT Treatment/Exercise - 10/25/18 0001      Shoulder Exercises: Supine   Protraction  Both;Strengthening;15 reps    Protraction Weight (lbs)  4#    Protraction Limitations  2x15      Shoulder Exercises: Seated   Flexion  Strengthening;Right;10 reps;Weights    Flexion Weight (lbs)  2    Flexion Limitations  within limited ROM to tolerance    Abduction  Strengthening;Right;10 reps;Weights    ABduction Weight (lbs)  2    ABduction Limitations  to ~100 deg with good tolerance and mechanics    Other Seated Exercises  R shoulder scaption with 2# to ~100 deg   cues  to perform within limited range to tolerance     Shoulder Exercises: Prone   Other Prone Exercises  R prone over green pball I, T, Y x10 each   to tolerance with cues to avoid straining     Shoulder Exercises: Sidelying   External Rotation  Strengthening;Right;10 reps;Limitations;Weights    External Rotation Weight (lbs)  1.2    External Rotation Limitations  2x10; dowel under elbow to maintain neutral   mild c/o burning R posterior shoulder with 2#   ABduction  Strengthening;Right;10 reps;Weights;Limitations    ABduction Weight (lbs)  2    ABduction Limitations  to end range with good control      Shoulder Exercises: Standing   External Rotation  Strengthening;Theraband;Right;15 reps    Theraband Level (Shoulder External Rotation)  Level 2 (Red)    External Rotation  Limitations  dowel under elbow    Internal Rotation  Strengthening;Right;Theraband;Limitations;15 reps    Theraband Level (Shoulder Internal Rotation)  Level 2 (Red)    Internal Rotation Limitations  dowel under elbow    Extension  Strengthening;Both;15 reps;Theraband    Theraband Level (Shoulder Extension)  Level 2 (Red)    Extension Limitations  good tolerance    Row  Strengthening;Both;Theraband;15 reps    Theraband Level (Shoulder Row)  Level 3 (Green)    Row Limitations  good form      Shoulder Exercises: ROM/Strengthening   UBE (Upper Arm Bike)  Lvl 1.0, 3 min forwards/back   slow speed, to tolerance     Shoulder Exercises: Stretch   Corner Stretch  2 reps;Limitations;20 seconds    Corner Stretch Limitations  90/90 doorway pec stretch on R UE to tolerance      Vasopneumatic   Number Minutes Vasopneumatic   15 minutes    Vasopnuematic Location   Shoulder   R   Vasopneumatic Pressure  Low    Vasopneumatic Temperature   Coldest      Manual Therapy   Manual Therapy  Passive ROM    Passive ROM  R shoulder PROM in all planes with prolonged stretch at end ROM; most limited in flexion and ER             PT Education - 10/25/18 1401    Education provided  Yes    Education Details  update to HEP- flexion and scaption with 2 lbs to tolerance, IR/ER with red TB    Person(s) Educated  Patient    Methods  Explanation;Demonstration;Tactile cues;Verbal cues;Handout    Comprehension  Verbalized understanding;Returned demonstration       PT Short Term Goals - 10/18/18 1621      PT SHORT TERM GOAL #1   Title  Patient to be independent with initial HEP.    Time  3    Period  Weeks    Status  Achieved        PT Long Term Goals - 10/18/18 1621      PT LONG TERM GOAL #1   Title  patient to be independent with advanced HEP    Time  6    Period  Weeks    Status  Partially Met   met for current   Target Date  11/29/18      PT LONG TERM GOAL #2   Title  patient to  demonstrate full and non-painful AROM (including multi-planar motions) without pain    Time  6    Period  Weeks    Status  Partially Met  improvements demonstrated in R shoulder abduction and ER AROM, flexion and IR decreased   Target Date  11/29/18      PT LONG TERM GOAL #3   Title  patient to improve R shoulder strength to >/= 4+/5 without pain    Time  6    Period  Weeks    Status  Partially Met   strength improved in ER and IR   Target Date  11/29/18      PT LONG TERM GOAL #4   Title  Patient to demonstrate overhead reaching to place 5# object on shelf overhead with <=1/10 pain.    Time  6    Period  Weeks    Status  Partially Met   able to reach to overhead shelf with 1lbs with no compensations and good scapulohumeral rhythm, unable to tolerate 2 or 3#   Target Date  11/29/18      PT LONG TERM GOAL #5   Title  Patient to report tolerance of 1 full shift at work without pain.    Time  6    Period  Weeks    Status  Partially Met   reports no R shoulder pain with light duty; has not returned to full duty   Target Date  11/29/18      PT LONG TERM GOAL #6   Title  Patient to report tolerance of fastening bra without pain.     Time  7    Period  Weeks    Status  Achieved   able to perform without pain           Plan - 10/25/18 1402    Clinical Impression Statement  Patient arrived to session with no new complaints. Reported that she still has trouble with heavy pushing activities with R UE. Patient tolerated R shoulder PROM with prolonged holds at end range for increased stretch; most limited in flexion and ER this date. Able to progress both sidelying and sitting overhead lifting to 2 lbs, albeit within limited ROM to tolerance. Progressed sideling ER with 2lbs- mild c/o burning in infraspinatus but able to complete 10 reps. Good tolerance for IR/ER with red banded resistance- patient modified resistance by stepping closer/further from door based on her tolerance.  Updated HEP with progression of exercises that was well tolerated today. Ended session with Gameready to R shoulder for post-exercise soreness. Patient without complaints at end of session.     PT Treatment/Interventions  ADLs/Self Care Home Management;Cryotherapy;Electrical Stimulation;Moist Heat;Therapeutic exercise;Therapeutic activities;Neuromuscular re-education;Patient/family education;Manual techniques;Vasopneumatic Device;Taping;Dry needling;Passive range of motion;Ultrasound;Splinting;Energy conservation;Scar mobilization    PT Next Visit Plan  progress R shoudler strength and ROM per tolerance    Consulted and Agree with Plan of Care  Patient       Patient will benefit from skilled therapeutic intervention in order to improve the following deficits and impairments:  Pain, Impaired UE functional use, Decreased strength, Decreased range of motion, Decreased activity tolerance, Decreased scar mobility, Postural dysfunction, Impaired flexibility  Visit Diagnosis: Acute pain of right shoulder  Stiffness of right shoulder, not elsewhere classified  Other symptoms and signs involving the musculoskeletal system  Muscle weakness (generalized)     Problem List Patient Active Problem List   Diagnosis Date Noted  . S/P arthroscopy of right shoulder 04/17/2018    Janene Harvey, PT, DPT 10/25/18 2:15 PM    [facility] [place] [address]  [place], [place], [address] Phone: [phone]  Fax:  [phone]  Name: Monica Petty MRN: [id number] Date of Birth: 11-18-1965

## 2018-10-29 ENCOUNTER — Encounter: Payer: Self-pay | Admitting: Physical Therapy

## 2018-10-29 ENCOUNTER — Ambulatory Visit: Payer: PRIVATE HEALTH INSURANCE | Admitting: Physical Therapy

## 2018-10-29 DIAGNOSIS — M25511 Pain in right shoulder: Secondary | ICD-10-CM

## 2018-10-29 DIAGNOSIS — R29898 Other symptoms and signs involving the musculoskeletal system: Secondary | ICD-10-CM

## 2018-10-29 DIAGNOSIS — M25611 Stiffness of right shoulder, not elsewhere classified: Secondary | ICD-10-CM

## 2018-10-29 DIAGNOSIS — M6281 Muscle weakness (generalized): Secondary | ICD-10-CM

## 2018-10-29 NOTE — Therapy (Signed)
[facility] [place] [address]  [address] [place], , [address] Phone: [phone]   Fax:  [phone]  Physical Therapy Treatment  Patient Details  Name: Monica Petty MRN: [id number] Date of Birth: 10/13/1965 Referring Provider (PT): Robert Collins, MD   Encounter Date: 10/29/2018  PT End of Session - 10/29/18 1806    Visit Number  44    Number of Visits  52    Date for PT Re-Evaluation  11/29/18    Authorization Type  WC    Authorization Time Period  addition of 2x/week for 6 weeks     Authorization - Visit Number  40    Authorization - Number of Visits  48    PT Start Time  1534    PT Stop Time  1623    PT Time Calculation (min)  49 min    Activity Tolerance  Patient tolerated treatment well    Behavior During Therapy  WFL for tasks assessed/performed       Past Medical History:  Diagnosis Date  . Anxiety   . Diverticulosis of colon   . GERD (gastroesophageal reflux disease)    takes okra pepsin supplement  . History of concussion    1987  fell of horse--- no residual  . Hypertension   . Migraine    occasional    Past Surgical History:  Procedure Laterality Date  . COLONOSCOPY  06/06/2016  . LAPAROSCOPIC CHOLECYSTECTOMY  06-03-2008   dr rosebower  [facility]  . REDUCTION MAMMAPLASTY Bilateral 03-05-2002   dr barber  [facility]  . SHOULDER ARTHROSCOPY WITH ROTATOR CUFF REPAIR AND SUBACROMIAL DECOMPRESSION Right 04/17/2018   Procedure: Right shoulder evaluation under anesthesia, scope, debridement, subacromial decompression, rotator cuff repair;  Surgeon: Collins, Robert, MD;  Location: [facility];  Service: Orthopedics;  Laterality: Right;  120 mins General with Intra Scalene Block  . VAGINAL HYSTERECTOMY  02-12-2002   dr neal  [facility]  . WISDOM TOOTH EXTRACTION  teen    There were no vitals filed for this visit.  Subjective Assessment - 10/29/18 1536    Subjective  Reports no issues with HEP. R  shoulder doing okay.     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 Maximum retraction is 16.5 mm and the tear is 14 mm wide. Mild tendinopathy involving the infraspinatus tendon probable small intrasubstance tears. The subscapularis tendon is intact.    Patient Stated Goals  keep strengthening and mobility of R shoulder    Currently in Pain?  Yes    Pain Score  3     Pain Location  Shoulder    Pain Orientation  Right    Pain Descriptors / Indicators  --   stretching   Pain Type  Surgical pain                       [facility] Adult PT Treatment/Exercise - 10/29/18 0001      Shoulder Exercises: Supine   Horizontal ABduction  Strengthening;Both;10 reps;Theraband;Limitations    Theraband Level (Shoulder Horizontal ABduction)  Level 1 (Yellow)    Horizontal ABduction Limitations  2x10 with cues for scap retraction    External Rotation  Strengthening;Both;10 reps;Theraband    Theraband Level (Shoulder External Rotation)  Level 1 (Yellow)    External Rotation Limitations  2x10; cues for palms up    Flexion  --    Theraband Level (  Shoulder Flexion)  --      Shoulder Exercises: Sidelying   External Rotation  Strengthening;Right;10 reps;Limitations;Weights    External Rotation Weight (lbs)  2    External Rotation Limitations  1st set 20x, 2nd set 10x; dowel under elbow to maintain neutral   cues for scap squeeze     Shoulder Exercises: Standing   External Rotation  Strengthening;Theraband;Right;10 reps    Theraband Level (Shoulder External Rotation)  Level 3 (Green)    External Rotation Limitations  dowel under elbow    Internal Rotation  Strengthening;Right;Theraband;Limitations;10 reps    Theraband Level (Shoulder Internal Rotation)  Level 3 (Green)    Internal Rotation Limitations  dowel under elbow    Row  Strengthening;Both;Theraband;15 reps    Theraband Level (Shoulder Row)  Level 3 (Green)    Row  Limitations  cues to step back for increased resistance      Shoulder Exercises: ROM/Strengthening   UBE (Upper Arm Bike)  Lvl 1.0, 3 min forwards/back      Shoulder Exercises: Stretch   Corner Stretch  2 reps;Limitations;30 seconds    Corner Stretch Limitations  90/90 doorway pec stretch on R UE to tolerance    External Rotation Stretch Limitations  R shoulder ER stretch in doorway 2x20" to tolerance      Vasopneumatic   Number Minutes Vasopneumatic   15 minutes    Vasopnuematic Location   Shoulder   R   Vasopneumatic Pressure  Low    Vasopneumatic Temperature   Coldest               PT Short Term Goals - 10/18/18 1621      PT SHORT TERM GOAL #1   Title  Patient to be independent with initial HEP.    Time  3    Period  Weeks    Status  Achieved        PT Long Term Goals - 10/18/18 1621      PT LONG TERM GOAL #1   Title  patient to be independent with advanced HEP    Time  6    Period  Weeks    Status  Partially Met   met for current   Target Date  11/29/18      PT LONG TERM GOAL #2   Title  patient to demonstrate full and non-painful AROM (including multi-planar motions) without pain    Time  6    Period  Weeks    Status  Partially Met   improvements demonstrated in R shoulder abduction and ER AROM, flexion and IR decreased   Target Date  11/29/18      PT LONG TERM GOAL #3   Title  patient to improve R shoulder strength to >/= 4+/5 without pain    Time  6    Period  Weeks    Status  Partially Met   strength improved in ER and IR   Target Date  11/29/18      PT LONG TERM GOAL #4   Title  Patient to demonstrate overhead reaching to place 5# object on shelf overhead with <=1/10 pain.    Time  6    Period  Weeks    Status  Partially Met   able to reach to overhead shelf with 1lbs with no compensations and good scapulohumeral rhythm, unable to tolerate 2 or 3#   Target Date  11/29/18      PT LONG TERM GOAL #5   Title    Patient to report tolerance of  1 full shift at work without pain.    Time  6    Period  Weeks    Status  Partially Met   reports no R shoulder pain with light duty; has not returned to full duty   Target Date  11/29/18      PT LONG TERM GOAL #6   Title  Patient to report tolerance of fastening bra without pain.     Time  7    Period  Weeks    Status  Achieved   able to perform without pain           Plan - 10/29/18 1807    Clinical Impression Statement  Patient arrived to session with no new complaints and reporting compliance with HEP. Able to perform increased reps of sidelying ER with 2lbs today. Worked on horizonal abduction and ER with light banded resistance- patient with good form but with report of slight pulling sensation in R infraspinatus. Progressed R shoulder IR/ER with green banded resistance today with good control. Worked on pec stretch and ER stretch in doorway with good tolerance. Ended session with Gameready to R shoulder for post-exercise soreness. No complaints at end of session. Patient showing good progress and increased tolerance for weighted resistance.     PT Treatment/Interventions  ADLs/Self Care Home Management;Cryotherapy;Electrical Stimulation;Moist Heat;Therapeutic exercise;Therapeutic activities;Neuromuscular re-education;Patient/family education;Manual techniques;Vasopneumatic Device;Taping;Dry needling;Passive range of motion;Ultrasound;Splinting;Energy conservation;Scar mobilization    PT Next Visit Plan  try increased weight with sitting flexion, scaption, abduction    Consulted and Agree with Plan of Care  Patient       Patient will benefit from skilled therapeutic intervention in order to improve the following deficits and impairments:  Pain, Impaired UE functional use, Decreased strength, Decreased range of motion, Decreased activity tolerance, Decreased scar mobility, Postural dysfunction, Impaired flexibility  Visit Diagnosis: Acute pain of right shoulder  Stiffness of  right shoulder, not elsewhere classified  Other symptoms and signs involving the musculoskeletal system  Muscle weakness (generalized)     Problem List Patient Active Problem List   Diagnosis Date Noted  . S/P arthroscopy of right shoulder 04/17/2018    Yevgeniya Kovalenko, PT, DPT 10/29/18 6:12 PM   [facility] [place] [address]  [address] [place], [place], [address] Phone: [phone]   Fax:  [phone]  Name: Monica Petty MRN: [id number] Date of Birth: 09/18/1966   

## 2018-11-01 ENCOUNTER — Ambulatory Visit: Payer: PRIVATE HEALTH INSURANCE

## 2018-11-01 DIAGNOSIS — M25511 Pain in right shoulder: Secondary | ICD-10-CM | POA: Diagnosis not present

## 2018-11-01 DIAGNOSIS — M25611 Stiffness of right shoulder, not elsewhere classified: Secondary | ICD-10-CM

## 2018-11-01 DIAGNOSIS — R29898 Other symptoms and signs involving the musculoskeletal system: Secondary | ICD-10-CM

## 2018-11-01 DIAGNOSIS — M6281 Muscle weakness (generalized): Secondary | ICD-10-CM

## 2018-11-01 NOTE — Therapy (Signed)
[facility] [address]  [place], [place], [address] Phone: [phone]   Fax:  [phone]  Physical Therapy Treatment  Patient Details  Name: Monica Petty MRN: [id number] Date of Birth: 1965/12/04 Referring Provider (PT): Sydnee Cabal, MD   Encounter Date: 11/01/2018  PT End of Session - 11/01/18 1537    Visit Number  45    Number of Visits  65    Date for PT Re-Evaluation  11/29/18    Authorization Type  WC    Authorization Time Period  addition of 2x/week for 6 weeks     Authorization - Visit Number  14    Authorization - Number of Visits  29    PT Start Time  6962    PT Stop Time  1622    PT Time Calculation (min)  52 min    Activity Tolerance  Patient tolerated treatment well    Behavior During Therapy  [facility] for tasks assessed/performed       Past Medical History:  Diagnosis Date  . Anxiety   . Diverticulosis of colon   . GERD (gastroesophageal reflux disease)    takes okra pepsin supplement  . History of concussion    1987  fell of horse--- no residual  . Hypertension   . Migraine    occasional    Past Surgical History:  Procedure Laterality Date  . COLONOSCOPY  06/06/2016  . LAPAROSCOPIC CHOLECYSTECTOMY  06-03-2008   dr Ulyess Blossom  [facility]  . REDUCTION MAMMAPLASTY Bilateral 03-05-2002   dr barber  [facility]  . SHOULDER ARTHROSCOPY WITH ROTATOR CUFF REPAIR AND SUBACROMIAL DECOMPRESSION Right 04/17/2018   Procedure: Right shoulder evaluation under anesthesia, scope, debridement, subacromial decompression, rotator cuff repair;  Surgeon: Sydnee Cabal, MD;  Location: [facility];  Service: Orthopedics;  Laterality: Right;  120 mins General with Intra Scalene Block  . VAGINAL HYSTERECTOMY  02-12-2002   dr Nori Riis  [facility]  . WISDOM TOOTH EXTRACTION  teen    There were no vitals filed for this visit.  Subjective Assessment - 11/01/18 1536    Subjective  Pt. 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 Maximum retraction is 16.5 mm and the tear is 14 mm wide. Mild tendinopathy involving the infraspinatus tendon probable small intrasubstance tears. The subscapularis tendon is intact.    Patient Stated Goals  keep strengthening and mobility of R shoulder    Currently in Pain?  Yes    Pain Score  2     Pain Location  Shoulder    Pain Orientation  Right;Lateral;Posterior    Pain Descriptors / Indicators  Burning    Pain Type  Surgical pain    Aggravating Factors   over using R shoulder while working     Multiple Pain Sites  No                       [facility] Adult PT Treatment/Exercise - 11/01/18 1540      Shoulder Exercises: Standing   External Rotation  Right;12 reps;Strengthening;Theraband    Theraband Level (Shoulder External Rotation)  Level 3 (Green)    Internal Rotation  Right;12 reps;Strengthening;Theraband    Theraband Level (Shoulder Internal Rotation)  Level 3 (Green)    Extension  Strengthening;Both;15 reps;Theraband   3" hold   Theraband Level (Shoulder Extension)  Level 2 (Red)    Row  Both;15 reps;Theraband;Strengthening   3" hold    Theraband Level (Shoulder Row)  Level 3 (Green)    Other Standing Exercises  Wall walks side ways with single yellow TB in hands 2 laps on wall ~ 15 ft       Shoulder Exercises: ROM/Strengthening   UBE (Upper Arm Bike)  Lvl 1.0, 3 min forwards/back    Cybex Row  15 reps    Cybex Row Limitations  15# - low row    Wall Pushups  15 reps   wall tolerated    Wall Pushups Limitations  leaning on orange p-ball     Other ROM/Strengthening Exercises  Serratus punch leaning on wall x 15 rpes       Shoulder Exercises: Stretch   Corner Stretch  2 reps;30 seconds    Corner Stretch Limitations  low and 90/90 doorway pec stretch     Cross  Chest Stretch Limitations  R shoulder "across chest stretch for posterior/inferior shoulder 2 x 30 sec       Manual Therapy   Manual Therapy  Soft tissue mobilization;Myofascial release    Soft tissue mobilization  STM to R infraspinatus/teres group    Myofascial Release  TPR to R infra, teres Major                PT Short Term Goals - 10/18/18 1621      PT SHORT TERM GOAL #1   Title  Patient to be independent with initial HEP.    Time  3    Period  Weeks    Status  Achieved        PT Long Term Goals - 10/18/18 1621      PT LONG TERM GOAL #1   Title  patient to be independent with advanced HEP    Time  6    Period  Weeks    Status  Partially Met   met for current   Target Date  11/29/18      PT LONG TERM GOAL #2   Title  patient to demonstrate full and non-painful AROM (including multi-planar motions) without pain    Time  6    Period  Weeks    Status  Partially Met   improvements demonstrated in R shoulder abduction and ER AROM, flexion and IR decreased   Target Date  11/29/18      PT LONG TERM GOAL #3   Title  patient to improve R shoulder strength to >/= 4+/5 without pain    Time  6    Period  Weeks    Status  Partially Met   strength improved in ER and IR   Target Date  11/29/18      PT LONG TERM GOAL #4   Title  Patient to demonstrate overhead reaching to place 5# object on shelf overhead with <=1/10 pain.    Time  6    Period  Weeks    Status  Partially Met   able to reach to overhead shelf with 1lbs with no compensations and good scapulohumeral rhythm, unable to tolerate 2 or 3#   Target Date  11/29/18      PT LONG TERM GOAL #5   Title  Patient to report tolerance of 1 full shift at work without pain.    Time  6    Period  Weeks  Status  Partially Met   reports no R shoulder pain with light duty; has not returned to full duty   Target Date  11/29/18      PT LONG TERM GOAL #6   Title  Patient to report tolerance of fastening bra without  pain.     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  Manual therapy addressing tenderness/tightness in R posterior shoulder with pt. noting relief following STM/DTM, TPR to this area.  Strengthening therex focusing on mild progression in repetitions with RTC/scapular strengthening activities.  Ended visit with ice/compression to R shoulder to reduce post-exercise swelling and pain.  Pt. leaving session reporting she was pain free.      Rehab Potential  Good    PT Treatment/Interventions  ADLs/Self Care Home Management;Cryotherapy;Electrical Stimulation;Moist Heat;Therapeutic exercise;Therapeutic activities;Neuromuscular re-education;Patient/family education;Manual techniques;Vasopneumatic Device;Taping;Dry needling;Passive range of motion;Ultrasound;Splinting;Energy conservation;Scar mobilization    PT Next Visit Plan  try increased weight with sitting flexion, scaption, abduction    Consulted and Agree with Plan of Care  Patient       Patient will benefit from skilled therapeutic intervention in order to improve the following deficits and impairments:  Pain, Impaired UE functional use, Decreased strength, Decreased range of motion, Decreased activity tolerance, Decreased scar mobility, Postural dysfunction, Impaired flexibility  Visit Diagnosis: Acute pain of right shoulder  Stiffness of right shoulder, not elsewhere classified  Other symptoms and signs involving the musculoskeletal system  Muscle weakness (generalized)     Problem List Patient Active Problem List   Diagnosis Date Noted  . S/P arthroscopy of right shoulder 04/17/2018    Bess Harvest, PTA 11/01/18 4:27 PM   [facility] [address]  [place], [place], [address] Phone: [phone]   Fax:  [phone]  Name: Monica Petty MRN: [id number] Date of Birth: September 28, 1965

## 2018-11-05 ENCOUNTER — Encounter: Payer: Self-pay | Admitting: Physical Therapy

## 2018-11-05 ENCOUNTER — Ambulatory Visit: Payer: PRIVATE HEALTH INSURANCE | Admitting: Physical Therapy

## 2018-11-05 DIAGNOSIS — M25611 Stiffness of right shoulder, not elsewhere classified: Secondary | ICD-10-CM

## 2018-11-05 DIAGNOSIS — R29898 Other symptoms and signs involving the musculoskeletal system: Secondary | ICD-10-CM

## 2018-11-05 DIAGNOSIS — M25511 Pain in right shoulder: Secondary | ICD-10-CM | POA: Diagnosis not present

## 2018-11-05 DIAGNOSIS — M6281 Muscle weakness (generalized): Secondary | ICD-10-CM

## 2018-11-05 NOTE — Therapy (Signed)
[facility] [address]  [place], [place], [address] Phone: [phone]   Fax:  [phone]  Physical Therapy Treatment  Patient Details  Name: Monica Petty MRN: [id number] Date of Birth: 1966-08-15 Referring Provider (PT): Sydnee Cabal, MD   Encounter Date: 11/05/2018  PT End of Session - 11/05/18 1610    Visit Number  73    Number of Visits  66    Date for PT Re-Evaluation  11/29/18    Authorization Type  WC    Authorization Time Period  addition of 2x/week for 6 weeks     Authorization - Visit Number  20    Authorization - Number of Visits  48    PT Start Time  1532    PT Stop Time  1620    PT Time Calculation (min)  48 min    Activity Tolerance  Patient tolerated treatment well;Patient limited by pain    Behavior During Therapy  [facility] for tasks assessed/performed       Past Medical History:  Diagnosis Date  . Anxiety   . Diverticulosis of colon   . GERD (gastroesophageal reflux disease)    takes okra pepsin supplement  . History of concussion    1987  fell of horse--- no residual  . Hypertension   . Migraine    occasional    Past Surgical History:  Procedure Laterality Date  . COLONOSCOPY  06/06/2016  . LAPAROSCOPIC CHOLECYSTECTOMY  06-03-2008   dr Ulyess Blossom  [facility]  . REDUCTION MAMMAPLASTY Bilateral 03-05-2002   dr barber  [facility]  . SHOULDER ARTHROSCOPY WITH ROTATOR CUFF REPAIR AND SUBACROMIAL DECOMPRESSION Right 04/17/2018   Procedure: Right shoulder evaluation under anesthesia, scope, debridement, subacromial decompression, rotator cuff repair;  Surgeon: Sydnee Cabal, MD;  Location: [facility];  Service: Orthopedics;  Laterality: Right;  120 mins General with Intra Scalene Block  . VAGINAL HYSTERECTOMY  02-12-2002   dr Nori Riis  [facility]  . WISDOM TOOTH EXTRACTION  teen    There were no vitals filed for this visit.  Subjective Assessment - 11/05/18 1532    Subjective  Reports that  she had no issues all weekend until yesterday when she swiffered and made soup. R shoulder was bothering her so much that she had to take a muscle relaxer. 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 Maximum retraction is 16.5 mm and the tear is 14 mm wide. Mild tendinopathy involving the infraspinatus tendon probable small intrasubstance tears. The subscapularis tendon is intact.    Patient Stated Goals  keep strengthening and mobility of R shoulder    Currently in Pain?  Yes    Pain Score  2     Pain Location  Shoulder    Pain Orientation  Right;Posterior    Pain Descriptors / Indicators  Burning    Pain Type  Surgical pain                       [facility] Adult PT Treatment/Exercise - 11/05/18 0001      Shoulder Exercises: Supine   Other Supine Exercises  R shoulder rhythmic stabilization at 90 deg 2x30"      Shoulder Exercises: Seated   Flexion  Strengthening;Right;10 reps;Weights  Flexion Limitations  without weight, to tolerance    Other Seated Exercises  R shoulder scaption within limited range to tolerance x10      Shoulder Exercises: Pulleys   Flexion  3 minutes    Flexion Limitations  to tolerance    Scaption  3 minutes    Scaption Limitations  to tolerance      Shoulder Exercises: Stretch   External Rotation Stretch Limitations  R shoulder IR/ER stretch with strap 5x5" each direction to tolerance      Modalities   Modalities  Cryotherapy      Cryotherapy   Number Minutes Cryotherapy  15 Minutes    Cryotherapy Location  Shoulder   R   Type of Cryotherapy  Ice pack      Electrical Stimulation   Electrical Stimulation Location  R posterior shoulder complex    Electrical Stimulation Action  IFC    Electrical Stimulation Parameters  80-150hz ; output 8 to  tolerance; 15 min    Electrical Stimulation Goals  Pain      Manual Therapy   Manual Therapy  Soft tissue mobilization;Myofascial release    Soft tissue mobilization  STM to R infraspinatus/teres group, pec, biceps muscle belly- most tenderness in infraspinatusa    Passive ROM  R shoulder PROM in all planes with prolonged stretch at end ROM   pt reporting uncomfortable popping at certain angles of moti              PT Short Term Goals - 10/18/18 1621      PT SHORT TERM GOAL #1   Title  Patient to be independent with initial HEP.    Time  3    Period  Weeks    Status  Achieved        PT Long Term Goals - 10/18/18 1621      PT LONG TERM GOAL #1   Title  patient to be independent with advanced HEP    Time  6    Period  Weeks    Status  Partially Met   met for current   Target Date  11/29/18      PT LONG TERM GOAL #2   Title  patient to demonstrate full and non-painful AROM (including multi-planar motions) without pain    Time  6    Period  Weeks    Status  Partially Met   improvements demonstrated in R shoulder abduction and ER AROM, flexion and IR decreased   Target Date  11/29/18      PT LONG TERM GOAL #3   Title  patient to improve R shoulder strength to >/= 4+/5 without pain    Time  6    Period  Weeks    Status  Partially Met   strength improved in ER and IR   Target Date  11/29/18      PT LONG TERM GOAL #4   Title  Patient to demonstrate overhead reaching to place 5# object on shelf overhead with <=1/10 pain.    Time  6    Period  Weeks    Status  Partially Met   able to reach to overhead shelf with 1lbs with no compensations and good scapulohumeral rhythm, unable to tolerate 2 or 3#   Target Date  11/29/18      PT LONG TERM GOAL #5   Title  Patient to report tolerance of 1 full shift at work without pain.    Time  6  Period  Weeks    Status  Partially Met   reports no R shoulder pain with light duty; has not returned to full duty   Target  Date  11/29/18      PT LONG TERM GOAL #6   Title  Patient to report tolerance of fastening bra without pain.     Time  7    Period  Weeks    Status  Achieved   able to perform without pain           Plan - 11/05/18 1613    Clinical Impression Statement  Patient arrived to session with report of increase in R shoulder pain after making soup and swiffering yesterday. Notes that she was able to perform all HEP on the days previously without any pain. Does mention that her current job assignment is causing discomfort in the R shoulder when she has to reach out in front of her. Patient tolerated gentle STM to R infraspinatus/teres group, pec, and biceps with most tenderness in infraspinatus insertion. Introduced gentle rhythmic stabilization to R shoulder in supine with good tolerance. Worked on R shoulder overhead lifting without weight today to avoid exacerbation of pain. Ended session with e-stim and ice pack to R shoulder for pain relief and patient with increased pain today competed to previous sessions. Normal integumentary repose observed and patient reporting pain relief at end of session. Advised patient to perform HEP as tolerated, avoiding pushing into pain. Patient reported understanding.     PT Treatment/Interventions  ADLs/Self Care Home Management;Cryotherapy;Electrical Stimulation;Moist Heat;Therapeutic exercise;Therapeutic activities;Neuromuscular re-education;Patient/family education;Manual techniques;Vasopneumatic Device;Taping;Dry needling;Passive range of motion;Ultrasound;Splinting;Energy conservation;Scar mobilization    PT Next Visit Plan  assess tolerance to strengthening    Consulted and Agree with Plan of Care  Patient       Patient will benefit from skilled therapeutic intervention in order to improve the following deficits and impairments:  Pain, Impaired UE functional use, Decreased strength, Decreased range of motion, Decreased activity tolerance, Decreased scar  mobility, Postural dysfunction, Impaired flexibility  Visit Diagnosis: Acute pain of right shoulder  Stiffness of right shoulder, not elsewhere classified  Other symptoms and signs involving the musculoskeletal system  Muscle weakness (generalized)     Problem List Patient Active Problem List   Diagnosis Date Noted  . S/P arthroscopy of right shoulder 04/17/2018     Janene Harvey, PT, DPT 11/05/18 5:14 PM   [facility] [address]  [place], [place], [address] Phone: [phone]   Fax:  [phone]  Name: Monica Petty MRN: [id number] Date of Birth: 06/18/1966

## 2018-11-08 ENCOUNTER — Ambulatory Visit: Payer: PRIVATE HEALTH INSURANCE

## 2018-11-08 DIAGNOSIS — M25511 Pain in right shoulder: Secondary | ICD-10-CM

## 2018-11-08 DIAGNOSIS — M6281 Muscle weakness (generalized): Secondary | ICD-10-CM

## 2018-11-08 DIAGNOSIS — R29898 Other symptoms and signs involving the musculoskeletal system: Secondary | ICD-10-CM

## 2018-11-08 DIAGNOSIS — M25611 Stiffness of right shoulder, not elsewhere classified: Secondary | ICD-10-CM

## 2018-11-08 NOTE — Therapy (Signed)
[facility] [address]  [place], [place], [address] Phone: [phone]   Fax:  [phone]  Physical Therapy Treatment  Patient Details  Name: Monica Petty MRN: [id number] Date of Birth: 04-May-1966 Referring Provider (PT): Sydnee Cabal, MD   Encounter Date: 11/08/2018  PT End of Session - 11/08/18 1033    Visit Number  45    Number of Visits  2    Date for PT Re-Evaluation  11/29/18    Authorization Type  WC    Authorization Time Period  addition of 2x/week for 6 weeks     Authorization - Visit Number  16    Authorization - Number of Visits  48    PT Start Time  1017    PT Stop Time  1111    PT Time Calculation (min)  54 min    Activity Tolerance  Patient tolerated treatment well;Patient limited by pain    Behavior During Therapy  [facility] for tasks assessed/performed       Past Medical History:  Diagnosis Date  . Anxiety   . Diverticulosis of colon   . GERD (gastroesophageal reflux disease)    takes okra pepsin supplement  . History of concussion    1987  fell of horse--- no residual  . Hypertension   . Migraine    occasional    Past Surgical History:  Procedure Laterality Date  . COLONOSCOPY  06/06/2016  . LAPAROSCOPIC CHOLECYSTECTOMY  06-03-2008   dr Ulyess Blossom  [facility]  . REDUCTION MAMMAPLASTY Bilateral 03-05-2002   dr barber  [facility]  . SHOULDER ARTHROSCOPY WITH ROTATOR CUFF REPAIR AND SUBACROMIAL DECOMPRESSION Right 04/17/2018   Procedure: Right shoulder evaluation under anesthesia, scope, debridement, subacromial decompression, rotator cuff repair;  Surgeon: Sydnee Cabal, MD;  Location: [facility];  Service: Orthopedics;  Laterality: Right;  120 mins General with Intra Scalene Block  . VAGINAL HYSTERECTOMY  02-12-2002   dr Nori Riis  [facility]  . WISDOM TOOTH EXTRACTION  teen    There were no vitals filed for this visit.  Subjective Assessment - 11/08/18 1031    Subjective  Pt. reporting  she feels R shoulder has recovered from housework flare-up last weekend.      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 Maximum retraction is 16.5 mm and the tear is 14 mm wide. Mild tendinopathy involving the infraspinatus tendon probable small intrasubstance tears. The subscapularis tendon is intact.    Patient Stated Goals  keep strengthening and mobility of R shoulder    Currently in Pain?  No/denies    Pain Score  0-No pain    Multiple Pain Sites  No                       [facility] Adult PT Treatment/Exercise - 11/08/18 1027      Shoulder Exercises: Standing   Internal Rotation  Right;15 reps;Strengthening;Theraband    Theraband Level (Shoulder Internal Rotation)  Level 3 (Green)    Flexion  Both;10 reps;Theraband    Theraband Level (Shoulder Flexion)  Level 1 (Yellow)    Flexion Limitations  onset of mild "burning" in R shoulder at 80-90 dg flexion     ABduction  Both;10 reps    ABduction Limitations  scaption leaning on wall no resistance    Mild "burning" at 90-110 dg then relief when out of this arc   Diagonals  Right;10 reps    Diagonals Limitations  R D1/D2 flexion x 10 reps    no resistance due to "burning" x 2 reps with yellow TB    Other Standing Exercises  B shouler ER with yellow TB leaning on doorseal x 15 reps       Shoulder Exercises: ROM/Strengthening   UBE (Upper Arm Bike)  Lvl 1.0, 3 min forwards/back    Cybex Row  10 reps   2 sets    Cybex Row Limitations  20# - 2 sets, low 20#; mid 10#     Wall Pushups  15 reps    Other ROM/Strengthening Exercises  R shoulder rhythmic stabilization with sustained R flexion at 90 dg leaning on orange p-ball on wall + therapist perturbations to ball x 20 sec       Vasopneumatic   Number Minutes Vasopneumatic   15 minutes    Vasopnuematic Location   Shoulder    Vasopneumatic Pressure  Low    Vasopneumatic Temperature   Coldest                PT Short Term Goals - 10/18/18 1621      PT SHORT TERM GOAL #1   Title  Patient to be independent with initial HEP.    Time  3    Period  Weeks    Status  Achieved        PT Long Term Goals - 10/18/18 1621      PT LONG TERM GOAL #1   Title  patient to be independent with advanced HEP    Time  6    Period  Weeks    Status  Partially Met   met for current   Target Date  11/29/18      PT LONG TERM GOAL #2   Title  patient to demonstrate full and non-painful AROM (including multi-planar motions) without pain    Time  6    Period  Weeks    Status  Partially Met   improvements demonstrated in R shoulder abduction and ER AROM, flexion and IR decreased   Target Date  11/29/18      PT LONG TERM GOAL #3   Title  patient to improve R shoulder strength to >/= 4+/5 without pain    Time  6    Period  Weeks    Status  Partially Met   strength improved in ER and IR   Target Date  11/29/18      PT LONG TERM GOAL #4   Title  Patient to demonstrate overhead reaching to place 5# object on shelf overhead with <=1/10 pain.    Time  6    Period  Weeks    Status  Partially Met   able to reach to overhead shelf with 1lbs with no compensations and good scapulohumeral rhythm, unable to tolerate 2 or 3#   Target Date  11/29/18      PT LONG TERM GOAL #5   Title  Patient to report tolerance of 1 full shift at work without pain.    Time  6    Period  Weeks    Status  Partially Met   reports no R shoulder pain with light duty; has not returned to full duty   Target Date  11/29/18      PT LONG TERM GOAL #6   Title  Patient to report tolerance of fastening bra without pain.     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  Tolerated all scapular strengthening and rhythmic stabilization activities in session well today.  Most difficulty with flexion, scaption, and diagonal elevation activities.  Occasional complaint of short-lasting, "burning" pain ~ 90-110 dg during elevation activities today which self-resolved with rest.  Elevation activities limited to ~ 1# resistance secondary to "burning" R shoulder pain at shoulder height arc of movement.  Ended visit with ice/compression to R shoulder to reduce post-exercise soreness and pain.      Rehab Potential  Good    PT Treatment/Interventions  ADLs/Self Care Home Management;Cryotherapy;Electrical Stimulation;Moist Heat;Therapeutic exercise;Therapeutic activities;Neuromuscular re-education;Patient/family education;Manual techniques;Vasopneumatic Device;Taping;Dry needling;Passive range of motion;Ultrasound;Splinting;Energy conservation;Scar mobilization    PT Next Visit Plan  continue to progress R scapular/RTC strengthening per pt. tolerance; rhythmic stabilization activities; gentle proprioception training    Consulted and Agree with Plan of Care  Patient       Patient will benefit from skilled therapeutic intervention in order to improve the following deficits and impairments:  Pain, Impaired UE functional use, Decreased strength, Decreased range of motion, Decreased activity tolerance, Decreased scar mobility, Postural dysfunction, Impaired flexibility  Visit Diagnosis: Acute pain of right shoulder  Stiffness of right shoulder, not elsewhere classified  Other symptoms and signs involving the musculoskeletal system  Muscle weakness (generalized)     Problem List Patient Active Problem List   Diagnosis Date Noted  . S/P arthroscopy of right shoulder 04/17/2018    Bess Harvest, PTA 11/08/18 12:15 PM   [facility] [address]  [place], [place], [address] Phone: [phone]   Fax:   [phone]  Name: AYLEEN MCKINSTRY MRN: [id number] Date of Birth: [birth year]

## 2018-11-12 ENCOUNTER — Ambulatory Visit: Payer: PRIVATE HEALTH INSURANCE | Admitting: Physical Therapy

## 2018-11-12 ENCOUNTER — Encounter: Payer: Self-pay | Admitting: Physical Therapy

## 2018-11-12 DIAGNOSIS — M25511 Pain in right shoulder: Secondary | ICD-10-CM | POA: Diagnosis not present

## 2018-11-12 DIAGNOSIS — M25611 Stiffness of right shoulder, not elsewhere classified: Secondary | ICD-10-CM

## 2018-11-12 DIAGNOSIS — R29898 Other symptoms and signs involving the musculoskeletal system: Secondary | ICD-10-CM

## 2018-11-12 DIAGNOSIS — M6281 Muscle weakness (generalized): Secondary | ICD-10-CM

## 2018-11-12 MED FILL — HYDROCHLOROTHIAZIDE 25 MG T: 25 | 90 days supply | Qty: 90 | Fill #0

## 2018-11-12 MED FILL — PREMARIN 1.25 MG TABLET: 1.25 | 90 days supply | Qty: 90 | Fill #1

## 2018-11-12 NOTE — Therapy (Signed)
[facility] [place] [address]  [address] [place], [place], [address] Phone: [phone]   Fax:  [phone]  Physical Therapy Treatment  Patient Details  Name: Monica Petty MRN: [id number] Date of Birth: 07/30/1966 Referring Provider (PT): Robert Collins, MD   Encounter Date: 11/12/2018  PT End of Session - 11/12/18 1612    Visit Number  48    Number of Visits  52    Date for PT Re-Evaluation  11/29/18    Authorization Type  WC    Authorization Time Period  addition of 2x/week for 6 weeks     Authorization - Visit Number  44    Authorization - Number of Visits  48    PT Start Time  1537    PT Stop Time  1617    PT Time Calculation (min)  40 min    Activity Tolerance  Patient tolerated treatment well;Patient limited by pain    Behavior During Therapy  WFL for tasks assessed/performed       Past Medical History:  Diagnosis Date  . Anxiety   . Diverticulosis of colon   . GERD (gastroesophageal reflux disease)    takes okra pepsin supplement  . History of concussion    1987  fell of horse--- no residual  . Hypertension   . Migraine    occasional    Past Surgical History:  Procedure Laterality Date  . COLONOSCOPY  06/06/2016  . LAPAROSCOPIC CHOLECYSTECTOMY  06-03-2008   dr rosebower  [facility]  . REDUCTION MAMMAPLASTY Bilateral 03-05-2002   dr barber  [facility]  . SHOULDER ARTHROSCOPY WITH ROTATOR CUFF REPAIR AND SUBACROMIAL DECOMPRESSION Right 04/17/2018   Procedure: Right shoulder evaluation under anesthesia, scope, debridement, subacromial decompression, rotator cuff repair;  Surgeon: Collins, Robert, MD;  Location: [facility];  Service: Orthopedics;  Laterality: Right;  120 mins General with Intra Scalene Block  . VAGINAL HYSTERECTOMY  02-12-2002   dr neal  [facility]  . WISDOM TOOTH EXTRACTION  teen    There were no vitals filed for this visit.  Subjective Assessment - 11/12/18 1538    Subjective  Reports that  her R shoulder feels about the same as last session. Has been perfomring HEP to tolerance, only using 1lb.     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 Maximum retraction is 16.5 mm and the tear is 14 mm wide. Mild tendinopathy involving the infraspinatus tendon probable small intrasubstance tears. The subscapularis tendon is intact.    Patient Stated Goals  keep strengthening and mobility of R shoulder    Currently in Pain?  Yes    Pain Score  2     Pain Location  Shoulder    Pain Orientation  Right;Posterior    Pain Descriptors / Indicators  Burning    Pain Type  Surgical pain                       [facility] Adult PT Treatment/Exercise - 11/12/18 0001      Shoulder Exercises: Supine   Other Supine Exercises  supine R shoulder reverse pendulums with green medball x10 CW/CCW, without weight x10 CW/CCW   cues to slow speed     Shoulder Exercises: Seated   Flexion  Right;10 reps;Weights;AAROM    Flexion Limitations  R shoulder flexion rollouts on orange pball 10x3"    Abduction  Right;10 reps;Weights;AAROM      ABduction Limitations  R shoulder abduction rollouts on orange pball 10x3"      Shoulder Exercises: Standing   External Rotation  Strengthening;Right;10 reps;Theraband    Theraband Level (Shoulder External Rotation)  Level 3 (Green)    External Rotation Limitations  2x10; dowel under elbow    Internal Rotation  Strengthening;Theraband;Right;10 reps    Theraband Level (Shoulder Internal Rotation)  Level 3 (Green)    Internal Rotation Limitations  dowel under elbow    Extension  Strengthening;Both;15 reps;Theraband    Theraband Level (Shoulder Extension)  Level 2 (Red)    Extension Limitations  nonpainful pop in R should    Row  Both;15 reps;Theraband;Strengthening    Theraband Level (Shoulder Row)  Level 3 (Green)    Row Limitations  good form      Shoulder Exercises: Pulleys   Flexion  3  minutes    Flexion Limitations  to tolerance    Scaption  3 minutes    Scaption Limitations  to tolerance      Electrical Stimulation   Electrical Stimulation Location  R posterior shoulder complex    Electrical Stimulation Action  IFC    Electrical Stimulation Parameters  50-150hz, output 10 to tolerance; 15 min    Electrical Stimulation Goals  Pain             PT Education - 11/12/18 1610    Education provided  Yes    Education Details  advised patient to use green TB for IR/ER as tolerated    Person(s) Educated  Patient    Methods  Explanation;Demonstration;Tactile cues;Verbal cues    Comprehension  Verbalized understanding       PT Short Term Goals - 10/18/18 1621      PT SHORT TERM GOAL #1   Title  Patient to be independent with initial HEP.    Time  3    Period  Weeks    Status  Achieved        PT Long Term Goals - 10/18/18 1621      PT LONG TERM GOAL #1   Title  patient to be independent with advanced HEP    Time  6    Period  Weeks    Status  Partially Met   met for current   Target Date  11/29/18      PT LONG TERM GOAL #2   Title  patient to demonstrate full and non-painful AROM (including multi-planar motions) without pain    Time  6    Period  Weeks    Status  Partially Met   improvements demonstrated in R shoulder abduction and ER AROM, flexion and IR decreased   Target Date  11/29/18      PT LONG TERM GOAL #3   Title  patient to improve R shoulder strength to >/= 4+/5 without pain    Time  6    Period  Weeks    Status  Partially Met   strength improved in ER and IR   Target Date  11/29/18      PT LONG TERM GOAL #4   Title  Patient to demonstrate overhead reaching to place 5# object on shelf overhead with <=1/10 pain.    Time  6    Period  Weeks    Status  Partially Met   able to reach to overhead shelf with 1lbs with no compensations and good scapulohumeral rhythm, unable to tolerate 2 or 3#   Target Date  11/29/18        PT LONG  TERM GOAL #5   Title  Patient to report tolerance of 1 full shift at work without pain.    Time  6    Period  Weeks    Status  Partially Met   reports no R shoulder pain with light duty; has not returned to full duty   Target Date  11/29/18      PT LONG TERM GOAL #6   Title  Patient to report tolerance of fastening bra without pain.     Time  7    Period  Weeks    Status  Achieved   able to perform without pain           Plan - 11/12/18 1613    Clinical Impression Statement  Patient arrived to session with no new complaints. Worked on progressive periscapular and RTC strengthening. Patient with episode of R shoulder popping during R shoulder extension which she reports was non-painful. Good tolerance of IR and ER with green TB today. Introduced supine reverse pendulum with light weight- patient reporting R shoulder discomfort, thus continued without weight with better tolerance. Ended session with e-stim to R shoulder at end of session for pain relief. Normal integumentary response and no complaints at end of session.    PT Treatment/Interventions  ADLs/Self Care Home Management;Cryotherapy;Electrical Stimulation;Moist Heat;Therapeutic exercise;Therapeutic activities;Neuromuscular re-education;Patient/family education;Manual techniques;Vasopneumatic Device;Taping;Dry needling;Passive range of motion;Ultrasound;Splinting;Energy conservation;Scar mobilization    PT Next Visit Plan  continue to progress R scapular/RTC strengthening per pt. tolerance; rhythmic stabilization activities; gentle proprioception training    Consulted and Agree with Plan of Care  Patient       Patient will benefit from skilled therapeutic intervention in order to improve the following deficits and impairments:  Pain, Impaired UE functional use, Decreased strength, Decreased range of motion, Decreased activity tolerance, Decreased scar mobility, Postural dysfunction, Impaired flexibility  Visit Diagnosis: Acute  pain of right shoulder  Stiffness of right shoulder, not elsewhere classified  Other symptoms and signs involving the musculoskeletal system  Muscle weakness (generalized)     Problem List Patient Active Problem List   Diagnosis Date Noted  . S/P arthroscopy of right shoulder 04/17/2018     Yevgeniya Kovalenko, PT, DPT 11/12/18 5:08 PM   [facility] [place] [address]  [address] [place], [place], [address] Phone: [phone]   Fax:  [phone]  Name: Tacie B Chasteen MRN: [id number] Date of Birth: 10/09/1965   

## 2018-11-15 ENCOUNTER — Ambulatory Visit: Payer: PRIVATE HEALTH INSURANCE

## 2018-11-15 DIAGNOSIS — M6281 Muscle weakness (generalized): Secondary | ICD-10-CM

## 2018-11-15 DIAGNOSIS — M25511 Pain in right shoulder: Secondary | ICD-10-CM

## 2018-11-15 DIAGNOSIS — R29898 Other symptoms and signs involving the musculoskeletal system: Secondary | ICD-10-CM

## 2018-11-15 DIAGNOSIS — M25611 Stiffness of right shoulder, not elsewhere classified: Secondary | ICD-10-CM

## 2018-11-15 NOTE — Therapy (Signed)
[facility] [place] [address]  [place], [place], [address] Phone: [phone]   Fax:  [phone]  Physical Therapy Treatment  Patient Details  Name: Monica Petty MRN: [id number] Date of Birth: [birth year] Referring Provider (PT): Sydnee Cabal, MD   Encounter Date: 11/15/2018  PT End of Session - 11/15/18 1543    Visit Number  54    Number of Visits  45    Date for PT Re-Evaluation  11/29/18    Authorization Type  WC    Authorization Time Period  addition of 2x/week for 6 weeks     Authorization - Visit Number  9    Authorization - Number of Visits  51    PT Start Time  1165   pt. arrived late    PT Stop Time  1630    PT Time Calculation (min)  53 min    Activity Tolerance  Patient tolerated treatment well;Patient limited by pain    Behavior During Therapy  [facility] for tasks assessed/performed       Past Medical History:  Diagnosis Date  . Anxiety   . Diverticulosis of colon   . GERD (gastroesophageal reflux disease)    takes okra pepsin supplement  . History of concussion    1987  fell of horse--- no residual  . Hypertension   . Migraine    occasional    Past Surgical History:  Procedure Laterality Date  . COLONOSCOPY  06/06/2016  . LAPAROSCOPIC CHOLECYSTECTOMY  06-03-2008   dr Ulyess Blossom  [facility]  . REDUCTION MAMMAPLASTY Bilateral 03-05-2002   dr barber  [facility]  . SHOULDER ARTHROSCOPY WITH ROTATOR CUFF REPAIR AND SUBACROMIAL DECOMPRESSION Right 04/17/2018   Procedure: Right shoulder evaluation under anesthesia, scope, debridement, subacromial decompression, rotator cuff repair;  Surgeon: Sydnee Cabal, MD;  Location: [facility];  Service: Orthopedics;  Laterality: Right;  120 mins General with Intra Scalene Block  . VAGINAL HYSTERECTOMY  02-12-2002   dr Nori Riis  [facility]  . WISDOM TOOTH EXTRACTION  teen    There were no vitals filed for this visit.  Subjective Assessment - 11/15/18 1541     Subjective  Pt. reporting she is between assignments at work.  Doing well today with no new complaints.      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 Maximum retraction is 16.5 mm and the tear is 14 mm wide. Mild tendinopathy involving the infraspinatus tendon probable small intrasubstance tears. The subscapularis tendon is intact.    Patient Stated Goals  keep strengthening and mobility of R shoulder    Currently in Pain?  No/denies    Pain Score  0-No pain    Multiple Pain Sites  No                       [facility] Adult PT Treatment/Exercise - 11/15/18 1556      Shoulder Exercises: Sidelying   External Rotation  --    External Rotation Weight (lbs)  --    External Rotation Limitations  --    ABduction  Right;15 reps;Strengthening;Weights    ABduction Weight (lbs)  2       Shoulder Exercises: Standing   External Rotation  Right;15 reps;Strengthening;Theraband    Theraband Level (Shoulder External Rotation)  Level 2 (Red)    External Rotation Limitations  elbow in neutral     Internal Rotation  Right;15  reps    Theraband Level (Shoulder Internal Rotation)  Level 3 (Green)    Flexion  Both;15 reps    Shoulder Flexion Weight (lbs)  1     Flexion Limitations  onset of mild "burning" in R shoulder at 70-120 dg flexion     Extension  Strengthening;Both;15 reps;Theraband    Theraband Level (Shoulder Extension)  Level 2 (Red)    Row  Both;15 reps;Theraband;Strengthening    Theraband Level (Shoulder Row)  Level 3 (Green)      Shoulder Exercises: Pulleys   Flexion  3 minutes    Flexion Limitations  to tolerance    Scaption  3 minutes    Scaption Limitations  to tolerance      Electrical Stimulation   Electrical Stimulation Location  R posterior shoulder complex    Electrical Stimulation Action  IFC    Electrical Stimulation Parameters  to tolerance, 15'    Electrical Stimulation Goals  Pain       Manual Therapy   Manual Therapy  Passive ROM    Manual therapy comments  Supine    Soft tissue mobilization  STM/DTM to R infra, teres group in area of tenderness    Myofascial Release  TPR to R infraspinatus     Passive ROM  R shoulder PROM all directions with gentle holds; mild limitation with abduction                PT Short Term Goals - 10/18/18 1621      PT SHORT TERM GOAL #1   Title  Patient to be independent with initial HEP.    Time  3    Period  Weeks    Status  Achieved        PT Long Term Goals - 10/18/18 1621      PT LONG TERM GOAL #1   Title  patient to be independent with advanced HEP    Time  6    Period  Weeks    Status  Partially Met   met for current   Target Date  11/29/18      PT LONG TERM GOAL #2   Title  patient to demonstrate full and non-painful AROM (including multi-planar motions) without pain    Time  6    Period  Weeks    Status  Partially Met   improvements demonstrated in R shoulder abduction and ER AROM, flexion and IR decreased   Target Date  11/29/18      PT LONG TERM GOAL #3   Title  patient to improve R shoulder strength to >/= 4+/5 without pain    Time  6    Period  Weeks    Status  Partially Met   strength improved in ER and IR   Target Date  11/29/18      PT LONG TERM GOAL #4   Title  Patient to demonstrate overhead reaching to place 5# object on shelf overhead with <=1/10 pain.    Time  6    Period  Weeks    Status  Partially Met   able to reach to overhead shelf with 1lbs with no compensations and good scapulohumeral rhythm, unable to tolerate 2 or 3#   Target Date  11/29/18      PT LONG TERM GOAL #5   Title  Patient to report tolerance of 1 full shift at work without pain.    Time  6    Period  Weeks  Status  Partially Met   reports no R shoulder pain with light duty; has not returned to full duty   Target Date  11/29/18      PT LONG TERM GOAL #6   Title  Patient to report tolerance of fastening bra  without pain.     Time  7    Period  Weeks    Status  Achieved   able to perform without pain           Plan - 11/15/18 1818    Clinical Impression Statement  Laneya reporting she is still having some mild burning pain at shoulder height and above flexion, scaption raises at home with 1# resistance.  Notes this pain self-resolves following HEP activities.  Presented with tenderness/tightness in posterior/inferior shoulder, infraspinatus, teres group thus addressed this with MT with good tolerance.  Therex focusing on scapular/RTC strengthening with good tolerance.  Ended visit with E-stim to R posterior shoulder with good reduction in pain.  Will continue to progress toward goals.      Rehab Potential  Good    PT Treatment/Interventions  ADLs/Self Care Home Management;Cryotherapy;Electrical Stimulation;Moist Heat;Therapeutic exercise;Therapeutic activities;Neuromuscular re-education;Patient/family education;Manual techniques;Vasopneumatic Device;Taping;Dry needling;Passive range of motion;Ultrasound;Splinting;Energy conservation;Scar mobilization    PT Next Visit Plan  continue to progress R scapular/RTC strengthening per pt. tolerance; rhythmic stabilization activities; gentle proprioception training    Consulted and Agree with Plan of Care  Patient       Patient will benefit from skilled therapeutic intervention in order to improve the following deficits and impairments:  Pain, Impaired UE functional use, Decreased strength, Decreased range of motion, Decreased activity tolerance, Decreased scar mobility, Postural dysfunction, Impaired flexibility  Visit Diagnosis: Acute pain of right shoulder  Stiffness of right shoulder, not elsewhere classified  Other symptoms and signs involving the musculoskeletal system  Muscle weakness (generalized)     Problem List Patient Active Problem List   Diagnosis Date Noted  . S/P arthroscopy of right shoulder 04/17/2018    Bess Harvest,  PTA 11/15/18 6:22 PM    [facility] [place] [address]  [place], [place], [address] Phone: [phone]   Fax:  [phone]  Name: MAIZE BRITTINGHAM MRN: [id number] Date of Birth: 1966/07/31

## 2018-11-19 ENCOUNTER — Ambulatory Visit: Payer: PRIVATE HEALTH INSURANCE | Attending: Nurse Practitioner

## 2018-11-19 DIAGNOSIS — R29898 Other symptoms and signs involving the musculoskeletal system: Secondary | ICD-10-CM | POA: Diagnosis present

## 2018-11-19 DIAGNOSIS — M6281 Muscle weakness (generalized): Secondary | ICD-10-CM | POA: Diagnosis present

## 2018-11-19 DIAGNOSIS — M25611 Stiffness of right shoulder, not elsewhere classified: Secondary | ICD-10-CM | POA: Diagnosis present

## 2018-11-19 DIAGNOSIS — M25511 Pain in right shoulder: Secondary | ICD-10-CM | POA: Insufficient documentation

## 2018-11-19 NOTE — Therapy (Signed)
[facility] [address]  [place], [place], [address] Phone: [phone]   Fax:  [phone]  Physical Therapy Treatment  Patient Details  Name: Monica Petty MRN: [id number] Date of Birth: Sep 22, 1965 Referring Provider (PT): Sydnee Cabal, MD   Encounter Date: 11/19/2018  PT End of Session - 11/19/18 1539    Visit Number  50    Number of Visits  68    Date for PT Re-Evaluation  11/29/18    Authorization Type  WC    Authorization Time Period  addition of 2x/week for 6 weeks     Authorization - Visit Number  10    Authorization - Number of Visits  75    PT Start Time  1532    PT Stop Time  1627    PT Time Calculation (min)  55 min    Activity Tolerance  Patient tolerated treatment well;Patient limited by pain    Behavior During Therapy  [facility] for tasks assessed/performed       Past Medical History:  Diagnosis Date  . Anxiety   . Diverticulosis of colon   . GERD (gastroesophageal reflux disease)    takes okra pepsin supplement  . History of concussion    1987  fell of horse--- no residual  . Hypertension   . Migraine    occasional    Past Surgical History:  Procedure Laterality Date  . COLONOSCOPY  06/06/2016  . LAPAROSCOPIC CHOLECYSTECTOMY  06-03-2008   dr Ulyess Blossom  [facility]  . REDUCTION MAMMAPLASTY Bilateral 03-05-2002   dr barber  [facility]  . SHOULDER ARTHROSCOPY WITH ROTATOR CUFF REPAIR AND SUBACROMIAL DECOMPRESSION Right 04/17/2018   Procedure: Right shoulder evaluation under anesthesia, scope, debridement, subacromial decompression, rotator cuff repair;  Surgeon: Sydnee Cabal, MD;  Location: [facility];  Service: Orthopedics;  Laterality: Right;  120 mins General with Intra Scalene Block  . VAGINAL HYSTERECTOMY  02-12-2002   dr Nori Riis  [facility]  . WISDOM TOOTH EXTRACTION  teen    There were no vitals filed for this visit.  Subjective Assessment - 11/19/18 1538    Subjective  Pt. reporting  she felt better after last visit noting some improved comfort with elevation activities 2# at home.      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 Maximum retraction is 16.5 mm and the tear is 14 mm wide. Mild tendinopathy involving the infraspinatus tendon probable small intrasubstance tears. The subscapularis tendon is intact.    Patient Stated Goals  keep strengthening and mobility of R shoulder    Currently in Pain?  No/denies    Pain Score  0-No pain    Multiple Pain Sites  No         [facility] PT Assessment - 11/19/18 0001      AROM   Right/Left Shoulder  Right    Right Shoulder Flexion  166 Degrees    Right Shoulder ABduction  156 Degrees    Right Shoulder Internal Rotation  --   FIR to 2" above bra strap    Right Shoulder External Rotation  --   FER to T3     PROM   Right/Left Shoulder  Right    Right Shoulder Flexion  162 Degrees    Right Shoulder ABduction  165 Degrees    Right Shoulder Internal Rotation  95 Degrees    Right Shoulder External Rotation  76 Degrees  Strength   Right/Left Shoulder  Right    Right Shoulder Flexion  4/5    Right Shoulder ABduction  4+/5    Right Shoulder Internal Rotation  4+/5    Right Shoulder External Rotation  4/5                   [facility] Adult PT Treatment/Exercise - 11/19/18 1559      Shoulder Exercises: Standing   Other Standing Exercises  Standing R flexion reach to overhead 2nd shelf 1#, 2#, 5# progressing to 5# as 30 sec continued.        Shoulder Exercises: ROM/Strengthening   UBE (Upper Arm Bike)  Lvl 2.0, 3 min forwards/back      Electrical Stimulation   Electrical Stimulation Location  R posterior shoulder complex    Electrical Stimulation Action  IFC    Electrical Stimulation Parameters  to tolerance, 15'    Electrical Stimulation Goals  Pain      Manual Therapy   Manual Therapy  Passive ROM    Manual therapy comments  Supine     Soft tissue mobilization  STM/DTM to R infra, teres group in area of tenderness    Myofascial Release  TPR to R infraspinatus     Passive ROM  R shoulder PROM all directions - pain free               PT Short Term Goals - 10/18/18 1621      PT SHORT TERM GOAL #1   Title  Patient to be independent with initial HEP.    Time  3    Period  Weeks    Status  Achieved        PT Long Term Goals - 11/19/18 1548      PT LONG TERM GOAL #1   Title  patient to be independent with advanced HEP    Time  6    Period  Weeks    Status  Partially Met   met for current     PT LONG TERM GOAL #2   Title  patient to demonstrate full and non-painful AROM (including multi-planar motions) without pain    Time  6    Period  Weeks    Status  Partially Met   11/19/18: pt. able to demo full non-painful AROM however with "shot" of 4/10 pain with flexion at arc of 90 dg movement      PT LONG TERM GOAL #3   Title  patient to improve R shoulder strength to >/= 4+/5 without pain    Time  6    Period  Weeks    Status  Partially Met   11/19/18: pt. able to demo improved R shoulder strength to 4+/5 with abduction, IR, still 4/5 strength with flexion, ER     PT LONG TERM GOAL #4   Title  Patient to demonstrate overhead reaching to place 5# object on shelf overhead with <=1/10 pain.    Time  6    Period  Weeks    Status  Partially Met   11/19/18: able to lift 5# dumbell to shelf overhead however with brief "shot" of posterior/inferior shoulder pain 4/10 which recovered to pain free with rest     PT LONG TERM GOAL #5   Title  Patient to report tolerance of 1 full shift at work without pain.    Time  6    Period  Weeks    Status  On-going  PT LONG TERM GOAL #6   Title  Patient to report tolerance of fastening bra without pain.     Time  7    Period  Weeks    Status  Achieved   able to perform without pain           Plan - 11/19/18 1624    Clinical Impression Statement  Pt. has made  good progress with therapy demonstrating improved R shoulder AROM and strength since last tested.  Continues to report 2x/day adherence to HEP partially achieving LTG#1.  Pt. able to demonstrate full AROM in all planes however with complaint of quick "shot" of pain in posterior/inferior shoulder to 4/10 with flexion at 90 dg arc of motion.  Pt. has partially achieved LTG #2.  Pt. making progress toward LTG #3 demonstrating improved R shoulder strength to 4+/5 with abduction, IR, however still 4/5 strength with flexion, ER partially achieving this goal.  Pt. partially achieving LTG #4 today able to lift 5# dumbbell to overhead 2nd shelf in session however with brief report of 4/10 pain in posterior/inferior shoulder and lateral shoulder.  Pt. has not yet returned to full duty work thus LTG #5 still ongoing.  Duration of session with MT focus on palpable tightness/reported tenderness in posterior/inferior shoulder musculature.  Pt. with good tolerance for TPR to this area and with improved R shoulder comfort at rest following MT.  Ended visit with E-stim to R shoulder complex for reduction in post-exercise tone/pain.  Will continue to progress toward LTGs.    Rehab Potential  Good    PT Treatment/Interventions  ADLs/Self Care Home Management;Cryotherapy;Electrical Stimulation;Moist Heat;Therapeutic exercise;Therapeutic activities;Neuromuscular re-education;Patient/family education;Manual techniques;Vasopneumatic Device;Taping;Dry needling;Passive range of motion;Ultrasound;Splinting;Energy conservation;Scar mobilization    PT Next Visit Plan  continue to progress R scapular/RTC strengthening per pt. tolerance; rhythmic stabilization activities; gentle proprioception training    Consulted and Agree with Plan of Care  Patient       Patient will benefit from skilled therapeutic intervention in order to improve the following deficits and impairments:  Pain, Impaired UE functional use, Decreased strength, Decreased  range of motion, Decreased activity tolerance, Decreased scar mobility, Postural dysfunction, Impaired flexibility  Visit Diagnosis: Acute pain of right shoulder  Stiffness of right shoulder, not elsewhere classified  Other symptoms and signs involving the musculoskeletal system  Muscle weakness (generalized)     Problem List Patient Active Problem List   Diagnosis Date Noted  . S/P arthroscopy of right shoulder 04/17/2018    Bess Harvest, PTA 11/19/18 5:44 PM   [facility] [address]  [place], [place], [address] Phone: [phone]   Fax:  [phone]  Name: Monica Petty MRN: [id number] Date of Birth: 03-Nov-1965

## 2018-11-22 ENCOUNTER — Ambulatory Visit: Payer: PRIVATE HEALTH INSURANCE | Admitting: Physical Therapy

## 2018-11-22 ENCOUNTER — Encounter: Payer: Self-pay | Admitting: Physical Therapy

## 2018-11-22 DIAGNOSIS — M25611 Stiffness of right shoulder, not elsewhere classified: Secondary | ICD-10-CM

## 2018-11-22 DIAGNOSIS — M25511 Pain in right shoulder: Secondary | ICD-10-CM

## 2018-11-22 DIAGNOSIS — M6281 Muscle weakness (generalized): Secondary | ICD-10-CM

## 2018-11-22 DIAGNOSIS — R29898 Other symptoms and signs involving the musculoskeletal system: Secondary | ICD-10-CM

## 2018-11-22 NOTE — Therapy (Signed)
[facility] [address]  [place], [place], [address] Phone: [phone]   Fax:  [phone]  Physical Therapy Treatment  Patient Details  Name: Monica Petty MRN: [id number] Date of Birth: 08-Dec-1965 Referring Provider (PT): Sydnee Cabal, MD   Encounter Date: 11/22/2018  PT End of Session - 11/22/18 1802    Visit Number  27    Number of Visits  44    Date for PT Re-Evaluation  11/29/18    Authorization Type  WC    Authorization Time Period  addition of 2x/week for 6 weeks     Authorization - Visit Number  51    Authorization - Number of Visits  39    PT Start Time  3016    PT Stop Time  1614    PT Time Calculation (min)  43 min    Activity Tolerance  Patient tolerated treatment well;Patient limited by pain    Behavior During Therapy  [facility] for tasks assessed/performed       Past Medical History:  Diagnosis Date  . Anxiety   . Diverticulosis of colon   . GERD (gastroesophageal reflux disease)    takes okra pepsin supplement  . History of concussion    1987  fell of horse--- no residual  . Hypertension   . Migraine    occasional    Past Surgical History:  Procedure Laterality Date  . COLONOSCOPY  06/06/2016  . LAPAROSCOPIC CHOLECYSTECTOMY  06-03-2008   dr Ulyess Blossom  [facility]  . REDUCTION MAMMAPLASTY Bilateral 03-05-2002   dr barber  [facility]  . SHOULDER ARTHROSCOPY WITH ROTATOR CUFF REPAIR AND SUBACROMIAL DECOMPRESSION Right 04/17/2018   Procedure: Right shoulder evaluation under anesthesia, scope, debridement, subacromial decompression, rotator cuff repair;  Surgeon: Sydnee Cabal, MD;  Location: [facility];  Service: Orthopedics;  Laterality: Right;  120 mins General with Intra Scalene Block  . VAGINAL HYSTERECTOMY  02-12-2002   dr Nori Riis  [facility]  . WISDOM TOOTH EXTRACTION  teen    There were no vitals filed for this visit.  Subjective Assessment - 11/22/18 1533    Subjective  Reports that  she feels that if she doesn't do more than she is supposed to do, then she doesn't have pain. Still using 1 and 2 lbs with HEP. Patient reports that she is comfortable being placed on a 30 day hold at this time until next MD F/U.     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 Maximum retraction is 16.5 mm and the tear is 14 mm wide. Mild tendinopathy involving the infraspinatus tendon probable small intrasubstance tears. The subscapularis tendon is intact.    Patient Stated Goals  keep strengthening and mobility of R shoulder    Currently in Pain?  No/denies                       [facility] Adult PT Treatment/Exercise - 11/22/18 0001      Neck Exercises: Seated   Shoulder Flexion  Right;10 reps    Shoulder Flexion Weights (lbs)  2    Shoulder Flexion Limitations  good form but c/o burning in R supraspinatus and infraspinatus    Shoulder ABduction  Right;10 reps    Shoulder Abduction Weights (lbs)  to 90 deg with 2#    Other Seated Exercise  R scaption with 2# x10   arc of pain at 90  deg     Shoulder Exercises: Supine   Protraction  Both;Strengthening;15 reps    Protraction Weight (lbs)  5    Protraction Limitations  2x15 good form    Other Supine Exercises  supine R shoulder reverse pendulums with green medball x10 CW/CCW, without weight x10 CW/CCW   cues to decrease size of circle to decrease discomfort     Shoulder Exercises: ROM/Strengthening   UBE (Upper Arm Bike)  Lvl 2.0, 3 min forwards/back    Lat Pull Limitations  10x10#    Cybex Row  10 reps    Cybex Row Limitations  20#       Manual Therapy   Soft tissue mobilization  STM to R infra, teres group in area of tenderness             PT Education - 11/22/18 1614    Education provided  Yes    Education Details  discussion on HEP progression     Person(s) Educated  Patient    Methods  Explanation    Comprehension  Verbalized understanding        PT Short Term Goals - 10/18/18 1621      PT SHORT TERM GOAL #1   Title  Patient to be independent with initial HEP.    Time  3    Period  Weeks    Status  Achieved        PT Long Term Goals - 11/19/18 1548      PT LONG TERM GOAL #1   Title  patient to be independent with advanced HEP    Time  6    Period  Weeks    Status  Partially Met   met for current     PT LONG TERM GOAL #2   Title  patient to demonstrate full and non-painful AROM (including multi-planar motions) without pain    Time  6    Period  Weeks    Status  Partially Met   11/19/18: pt. able to demo full non-painful AROM however with "shot" of 4/10 pain with flexion at arc of 90 dg movement      PT LONG TERM GOAL #3   Title  patient to improve R shoulder strength to >/= 4+/5 without pain    Time  6    Period  Weeks    Status  Partially Met   11/19/18: pt. able to demo improved R shoulder strength to 4+/5 with abduction, IR, still 4/5 strength with flexion, ER     PT LONG TERM GOAL #4   Title  Patient to demonstrate overhead reaching to place 5# object on shelf overhead with <=1/10 pain.    Time  6    Period  Weeks    Status  Partially Met   11/19/18: able to lift 5# dumbell to shelf overhead however with brief "shot" of posterior/inferior shoulder pain 4/10 which recovered to pain free with rest     PT LONG TERM GOAL #5   Title  Patient to report tolerance of 1 full shift at work without pain.    Time  6    Period  Weeks    Status  On-going      PT LONG TERM GOAL #6   Title  Patient to report tolerance of fastening bra without pain.     Time  7    Period  Weeks    Status  Achieved   able to perform without pain  Plan - 11/22/18 1803    Clinical Impression Statement  Patient arrived to session with no new complaints. Reports that if she doesn't do more than she is supposed to do that she feels okay. However, patient has not returned to PLOF or to her physical abilities required to  return to work. Measurements taken last session, showing good improvements in strength and ROM. Tolerated R shoulder stability ther-ex with good form. Able to control pain levels with reverse pendulum by decreasing size of circles. Worked on overhead elevation with 2 lbs with patient reporting arc of pain at about 90 degrees. Able to tolerate machine strengthening with no pain. Patient on a 30 day hold at this time, awaiting further instruction from MD.     PT Treatment/Interventions  ADLs/Self Care Home Management;Cryotherapy;Electrical Stimulation;Moist Heat;Therapeutic exercise;Therapeutic activities;Neuromuscular re-education;Patient/family education;Manual techniques;Vasopneumatic Device;Taping;Dry needling;Passive range of motion;Ultrasound;Splinting;Energy conservation;Scar mobilization    PT Next Visit Plan  30 day hold at this time    Consulted and Agree with Plan of Care  Patient       Patient will benefit from skilled therapeutic intervention in order to improve the following deficits and impairments:  Pain, Impaired UE functional use, Decreased strength, Decreased range of motion, Decreased activity tolerance, Decreased scar mobility, Postural dysfunction, Impaired flexibility  Visit Diagnosis: Acute pain of right shoulder  Stiffness of right shoulder, not elsewhere classified  Other symptoms and signs involving the musculoskeletal system  Muscle weakness (generalized)     Problem List Patient Active Problem List   Diagnosis Date Noted  . S/P arthroscopy of right shoulder 04/17/2018     Janene Harvey, PT, DPT 11/22/18 6:05 PM   [facility] [address]  [place], [place], [address] Phone: [phone]   Fax:  [phone]  Name: BAYLOR TEEGARDEN MRN: [id number] Date of Birth: 1966/05/11

## 2018-11-26 ENCOUNTER — Other Ambulatory Visit: Payer: Self-pay | Admitting: Ophthalmology

## 2018-11-26 DIAGNOSIS — M25511 Pain in right shoulder: Secondary | ICD-10-CM

## 2018-11-28 ENCOUNTER — Encounter: Payer: Self-pay | Admitting: Physical Therapy

## 2018-11-28 ENCOUNTER — Ambulatory Visit: Payer: PRIVATE HEALTH INSURANCE | Admitting: Physical Therapy

## 2018-11-28 ENCOUNTER — Other Ambulatory Visit: Payer: Self-pay

## 2018-11-28 DIAGNOSIS — M6281 Muscle weakness (generalized): Secondary | ICD-10-CM

## 2018-11-28 DIAGNOSIS — M25511 Pain in right shoulder: Secondary | ICD-10-CM | POA: Diagnosis not present

## 2018-11-28 DIAGNOSIS — R29898 Other symptoms and signs involving the musculoskeletal system: Secondary | ICD-10-CM

## 2018-11-28 DIAGNOSIS — M25611 Stiffness of right shoulder, not elsewhere classified: Secondary | ICD-10-CM

## 2018-11-28 NOTE — Therapy (Signed)
[facility] [address]  [place], [place], [address] Phone: [phone]   Fax:  [phone]  Physical Therapy Treatment  Patient Details  Name: Monica Petty MRN: [id number] Date of Birth: November 18, 1965 Referring Provider (PT): Sydnee Cabal, MD   Encounter Date: 11/28/2018  PT End of Session - 11/28/18 1541    Visit Number  52    Number of Visits  13    Date for PT Re-Evaluation  01/09/19    Authorization Type  WC    Authorization Time Period  addition of 2x/week for 6 weeks     Authorization - Visit Number  1    Authorization - Number of Visits  12    PT Start Time  2409    PT Stop Time  1613    PT Time Calculation (min)  38 min    Activity Tolerance  Patient tolerated treatment well;Patient limited by pain    Behavior During Therapy  [facility] for tasks assessed/performed       Past Medical History:  Diagnosis Date  . Anxiety   . Diverticulosis of colon   . GERD (gastroesophageal reflux disease)    takes okra pepsin supplement  . History of concussion    1987  fell of horse--- no residual  . Hypertension   . Migraine    occasional    Past Surgical History:  Procedure Laterality Date  . COLONOSCOPY  06/06/2016  . LAPAROSCOPIC CHOLECYSTECTOMY  06-03-2008   dr Ulyess Blossom  [facility]  . REDUCTION MAMMAPLASTY Bilateral 03-05-2002   dr barber  [facility]  . SHOULDER ARTHROSCOPY WITH ROTATOR CUFF REPAIR AND SUBACROMIAL DECOMPRESSION Right 04/17/2018   Procedure: Right shoulder evaluation under anesthesia, scope, debridement, subacromial decompression, rotator cuff repair;  Surgeon: Sydnee Cabal, MD;  Location: [facility];  Service: Orthopedics;  Laterality: Right;  120 mins General with Intra Scalene Block  . VAGINAL HYSTERECTOMY  02-12-2002   dr Nori Riis  [facility]  . WISDOM TOOTH EXTRACTION  teen    There were no vitals filed for this visit.  Subjective Assessment - 11/28/18 1536    Subjective  Reports that  she saw MD who has scheduled her for MRI arthrogram to R shoulder. Would like her to continue 2x/6 weeks.     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 Maximum retraction is 16.5 mm and the tear is 14 mm wide. Mild tendinopathy involving the infraspinatus tendon probable small intrasubstance tears. The subscapularis tendon is intact.    Patient Stated Goals  keep strengthening and mobility of R shoulder    Currently in Pain?  No/denies         [facility] PT Assessment - 11/28/18 0001      Assessment   Medical Diagnosis  Traumatic rupture of RTC (R)    Referring Provider (PT)  Sydnee Cabal, MD    Onset Date/Surgical Date  04/17/18      AROM   Right/Left Shoulder  Right    Right Shoulder Flexion  166 Degrees    Right Shoulder ABduction  156 Degrees    Right Shoulder Internal Rotation  --   FIR to 2" above bra strap   Right Shoulder External Rotation  --   FER to T3     PROM   Right/Left Shoulder  Right    Right Shoulder Flexion  162 Degrees    Right Shoulder ABduction  165 Degrees  Right Shoulder Internal Rotation  95 Degrees    Right Shoulder External Rotation  76 Degrees      Strength   Right/Left Shoulder  Right    Right Shoulder Flexion  4/5    Right Shoulder ABduction  4+/5    Right Shoulder Internal Rotation  4+/5    Right Shoulder External Rotation  4/5                   [facility] Adult PT Treatment/Exercise - 11/28/18 0001      Shoulder Exercises: Seated   Diagonals  Strengthening;Right;10 reps;Theraband    Theraband Level (Shoulder Diagonals)  Level 1 (Yellow)    Diagonals Limitations  cues for proper movement pattern    Other Seated Exercises  R shoulder rhythmic stabilization at 90 deg elbow flexion and shoulder at neutral 3x20"      Shoulder Exercises: Prone   External Rotation  Strengthening;Right;10 reps;Theraband    External Rotation Limitations  2x10; prone over green TB W with  manual assistance for proper posiitoning   advised to avoid painful end range     Shoulder Exercises: Standing   Protraction  Strengthening;15 reps    Protraction Limitations  B serratus push up plus at wall x15    Other Standing Exercises  wall push up 2x10   cues to maintain elbows in at side   Other Standing Exercises  R shoulder ball on wall circles CW/CCW 2x10   cues for scap retraction     Shoulder Exercises: ROM/Strengthening   UBE (Upper Arm Bike)  Lvl 2.0, 3 min forwards/back      Shoulder Exercises: Stretch   Other Shoulder Stretches  R shoulder ER/IR stretch with strap 5x5" to tolerance    Cross Chest Stretch Limitations  R ball on wall massage to R posterior shoulder to tolerance x2 min              PT Education - 11/28/18 1812    Education provided  Yes    Education Details  advised patient to continue with HEP as tolerated    Person(s) Educated  Patient    Methods  Explanation;Demonstration;Tactile cues;Verbal cues;Handout    Comprehension  Verbalized understanding;Returned demonstration       PT Short Term Goals - 11/28/18 1543      PT SHORT TERM GOAL #1   Title  Patient to be independent with initial HEP.    Time  3    Period  Weeks    Status  Achieved        PT Long Term Goals - 11/28/18 1543      PT LONG TERM GOAL #1   Title  patient to be independent with advanced HEP    Time  6    Period  Weeks    Status  Partially Met   met for current   Target Date  01/09/19      PT LONG TERM GOAL #2   Title  patient to demonstrate full and non-painful AROM (including multi-planar motions) without pain    Time  6    Period  Weeks    Status  Partially Met   11/19/18: pt. able to demo full non-painful AROM however with "shot" of 4/10 pain with flexion at arc of 90 dg movement    Target Date  01/09/19      PT LONG TERM GOAL #3   Title  patient to improve R shoulder strength to >/= 4+/5 without pain  Time  6    Period  Weeks    Status  Partially  Met   11/19/18: pt. able to demo improved R shoulder strength to 4+/5 with abduction, IR, still 4/5 strength with flexion, ER   Target Date  01/09/19      PT LONG TERM GOAL #4   Title  Patient to demonstrate overhead reaching to place 5# object on shelf overhead with <=1/10 pain.    Time  6    Period  Weeks    Status  Partially Met   11/19/18: able to lift 5# dumbell to shelf overhead however with brief "shot" of posterior/inferior shoulder pain 4/10 which recovered to pain free with rest   Target Date  01/09/19      PT LONG TERM GOAL #5   Title  Patient to report tolerance of 1 full shift at work without pain.    Time  6    Period  Weeks    Status  On-going   patient has not returned to work at this time   Target Date  01/09/19      PT LONG TERM GOAL #6   Title  Patient to report tolerance of fastening bra without pain.     Time  7    Period  Weeks    Status  Achieved   able to perform without pain           Plan - 11/28/18 1705    Clinical Impression Statement  Patient returning to session with report that she saw her surgeon for f/u of R shoulder pain- MD advised her that she would need MRI arthrogram to rule out RTC tear. Advised her to continue with PT 2x/week for 6 weeks. Patient reporting that she has been consistent with HEP, and now using 2lbs for flexion, scaption, and abduction activities. Goals recently updated on 11/19/18, thus measurements not taken today. Worked on R shoulder stability and strengthening with R arm away from trunk, as patient reporting most discomfort in this position. Patient intermittently reporting discomfort at certain angles of movement, but able to readjust shoulder to comfort. Patient without pain at end of session. D/t patient's continued pain and strength deficits, would benefit from continued skilled PT services 2x/week for 6 weeks to address remaining goals and return to work.     PT Frequency  2x / week    PT Duration  6 weeks    PT  Treatment/Interventions  ADLs/Self Care Home Management;Cryotherapy;Electrical Stimulation;Moist Heat;Therapeutic exercise;Therapeutic activities;Neuromuscular re-education;Patient/family education;Manual techniques;Vasopneumatic Device;Taping;Dry needling;Passive range of motion;Ultrasound;Splinting;Energy conservation;Scar mobilization;Iontophoresis '4mg'$ /ml Dexamethasone    PT Next Visit Plan  progress R shoulder strengthening to tolerance    Consulted and Agree with Plan of Care  Patient       Patient will benefit from skilled therapeutic intervention in order to improve the following deficits and impairments:  Pain, Impaired UE functional use, Decreased strength, Decreased range of motion, Decreased activity tolerance, Decreased scar mobility, Postural dysfunction, Impaired flexibility  Visit Diagnosis: Acute pain of right shoulder  Stiffness of right shoulder, not elsewhere classified  Other symptoms and signs involving the musculoskeletal system  Muscle weakness (generalized)     Problem List Patient Active Problem List   Diagnosis Date Noted  . S/P arthroscopy of right shoulder 04/17/2018     Janene Harvey, PT, DPT 11/28/18 6:15 PM   [facility] [address]  [place], [place], [address] Phone: [phone]   Fax:  [phone]  Name: AMARE KONTOS MRN: [id number] Date of Birth: [birth year]

## 2018-12-03 ENCOUNTER — Ambulatory Visit: Payer: PRIVATE HEALTH INSURANCE

## 2018-12-03 ENCOUNTER — Other Ambulatory Visit: Payer: Self-pay

## 2018-12-03 DIAGNOSIS — M25511 Pain in right shoulder: Secondary | ICD-10-CM | POA: Diagnosis not present

## 2018-12-03 DIAGNOSIS — R29898 Other symptoms and signs involving the musculoskeletal system: Secondary | ICD-10-CM

## 2018-12-03 DIAGNOSIS — M6281 Muscle weakness (generalized): Secondary | ICD-10-CM

## 2018-12-03 DIAGNOSIS — M25611 Stiffness of right shoulder, not elsewhere classified: Secondary | ICD-10-CM

## 2018-12-03 MED FILL — RESTASIS 0.05% EYE EMULSION: 0.05 | 90 days supply | Qty: 180 | Fill #0

## 2018-12-03 MED FILL — LISINOPRIL 5 MG TABLET: 5 | 90 days supply | Qty: 90 | Fill #1

## 2018-12-03 NOTE — Therapy (Signed)
[facility] [address]  [place], [place], [address] Phone: [phone]   Fax:  [phone]  Physical Therapy Treatment  Patient Details  Name: Monica Petty MRN: [id number] Date of Birth: 11-Apr-1966 Referring Provider (PT): Sydnee Cabal, MD   Encounter Date: 12/03/2018  PT End of Session - 12/03/18 0937    Visit Number  70    Number of Visits  51    Date for PT Re-Evaluation  01/09/19    Authorization Type  WC    Authorization Time Period  addition of 2x/week for 6 weeks     Authorization - Visit Number  2    Authorization - Number of Visits  12    PT Start Time  [phone]    PT Stop Time  1032    PT Time Calculation (min)  61 min    Activity Tolerance  Patient tolerated treatment well;Patient limited by pain    Behavior During Therapy  [facility] for tasks assessed/performed       Past Medical History:  Diagnosis Date  . Anxiety   . Diverticulosis of colon   . GERD (gastroesophageal reflux disease)    takes okra pepsin supplement  . History of concussion    1987  fell of horse--- no residual  . Hypertension   . Migraine    occasional    Past Surgical History:  Procedure Laterality Date  . COLONOSCOPY  06/06/2016  . LAPAROSCOPIC CHOLECYSTECTOMY  06-03-2008   dr Ulyess Blossom  [facility]  . REDUCTION MAMMAPLASTY Bilateral 03-05-2002   dr barber  [facility]  . SHOULDER ARTHROSCOPY WITH ROTATOR CUFF REPAIR AND SUBACROMIAL DECOMPRESSION Right 04/17/2018   Procedure: Right shoulder evaluation under anesthesia, scope, debridement, subacromial decompression, rotator cuff repair;  Surgeon: Sydnee Cabal, MD;  Location: [facility];  Service: Orthopedics;  Laterality: Right;  120 mins General with Intra Scalene Block  . VAGINAL HYSTERECTOMY  02-12-2002   dr Nori Riis  [facility]  . WISDOM TOOTH EXTRACTION  teen    There were no vitals filed for this visit.  Subjective Assessment - 12/03/18 0937    Subjective  Doing well.       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 Maximum retraction is 16.5 mm and the tear is 14 mm wide. Mild tendinopathy involving the infraspinatus tendon probable small intrasubstance tears. The subscapularis tendon is intact.    Patient Stated Goals  keep strengthening and mobility of R shoulder    Currently in Pain?  No/denies    Pain Score  0-No pain    Multiple Pain Sites  No                       [facility] Adult PT Treatment/Exercise - 12/03/18 0945      Shoulder Exercises: Standing   Flexion  Both;15 reps    Theraband Level (Shoulder Flexion)  Level 1 (Yellow)   long yellow TB standing on band    Flexion Limitations  mild pain at 90 dg     ABduction  Both;15 reps;Strengthening;Theraband    Theraband Level (Shoulder ABduction)  Level 1 (Yellow)   standing on long yellow TB - full range    ABduction Limitations  mild pain at 90 dg       Shoulder Exercises: ROM/Strengthening   UBE (Upper Arm Bike)  Lvl 2.5, 3 min forwards/back    Lat Pull  15 reps    Lat Pull Limitations  20#    Cybex Row  10 reps    Cybex Row Limitations  25#    Wall Pushups  15 reps    Wall Pushups Limitations  serratus rollups on 6" foam roller on wall       Shoulder Exercises: Stretch   Corner Stretch  2 reps;30 seconds    Corner Stretch Limitations  mid stretch     External Rotation Stretch  2 reps;30 seconds      Shoulder Exercises: Body Blade   External Rotation  15 seconds;3 reps    External Rotation Limitations  neutral elbow positioning       Electrical Stimulation   Electrical Stimulation Location  R posterior shoulder complex    Electrical Stimulation Action  IFC    Electrical Stimulation Parameters  to tolerance, 15'    Electrical Stimulation Goals  Pain      Manual Therapy   Manual Therapy  Scapular mobilization    Soft tissue mobilization  STM to R infra, teres group in area of tenderness   Less  tenderness noted today in this area   Scapular Mobilization  downward glide + prolonged hold with R shoulder in scaption and flexion stretch with therapist                PT Short Term Goals - 11/28/18 1543      PT SHORT TERM GOAL #1   Title  Patient to be independent with initial HEP.    Time  3    Period  Weeks    Status  Achieved        PT Long Term Goals - 11/28/18 1543      PT LONG TERM GOAL #1   Title  patient to be independent with advanced HEP    Time  6    Period  Weeks    Status  Partially Met   met for current   Target Date  01/09/19      PT LONG TERM GOAL #2   Title  patient to demonstrate full and non-painful AROM (including multi-planar motions) without pain    Time  6    Period  Weeks    Status  Partially Met   11/19/18: pt. able to demo full non-painful AROM however with "shot" of 4/10 pain with flexion at arc of 90 dg movement    Target Date  01/09/19      PT LONG TERM GOAL #3   Title  patient to improve R shoulder strength to >/= 4+/5 without pain    Time  6    Period  Weeks    Status  Partially Met   11/19/18: pt. able to demo improved R shoulder strength to 4+/5 with abduction, IR, still 4/5 strength with flexion, ER   Target Date  01/09/19      PT LONG TERM GOAL #4   Title  Patient to demonstrate overhead reaching to place 5# object on shelf overhead with <=1/10 pain.    Time  6    Period  Weeks    Status  Partially Met   11/19/18: able to lift 5# dumbell to shelf overhead however with brief "shot" of posterior/inferior shoulder pain 4/10 which recovered to pain free with rest   Target Date  01/09/19      PT LONG TERM GOAL #5   Title  Patient to report tolerance of 1 full shift at work without pain.  Time  6    Period  Weeks    Status  On-going   patient has not returned to work at this time   Target Date  01/09/19      PT LONG TERM GOAL #6   Title  Patient to report tolerance of fastening bra without pain.     Time  7    Period   Weeks    Status  Achieved   able to perform without pain           Plan - 12/03/18 0938    Clinical Impression Statement  Pt. reporting she is performing 2# dumbbells 2 x 15 reps on flexion, scaption HEP activities at home and feels she is tolerating this well.  Pt. with some improvement in tolerance for elevation strengthening activities in session today.  Tolerated addition of body blade IR/ER in neutral positioning well today.  MT addressing mild tightness/tenderness in posterior/inferior shoulder musculature however pt. with noted reduction in tenderness as compared to previous visits.  ended visit with low-level pain thus applied moist heat/E-stim to R shoulder to reduce post-exercise pain.  Will continue to progress toward goals.      Rehab Potential  Good    PT Treatment/Interventions  ADLs/Self Care Home Management;Cryotherapy;Electrical Stimulation;Moist Heat;Therapeutic exercise;Therapeutic activities;Neuromuscular re-education;Patient/family education;Manual techniques;Vasopneumatic Device;Taping;Dry needling;Passive range of motion;Ultrasound;Splinting;Energy conservation;Scar mobilization;Iontophoresis 47m/ml Dexamethasone    PT Next Visit Plan  progress R shoulder strengthening to tolerance    Consulted and Agree with Plan of Care  Patient       Patient will benefit from skilled therapeutic intervention in order to improve the following deficits and impairments:  Pain, Impaired UE functional use, Decreased strength, Decreased range of motion, Decreased activity tolerance, Decreased scar mobility, Postural dysfunction, Impaired flexibility  Visit Diagnosis: Acute pain of right shoulder  Stiffness of right shoulder, not elsewhere classified  Other symptoms and signs involving the musculoskeletal system  Muscle weakness (generalized)     Problem List Patient Active Problem List   Diagnosis Date Noted  . S/P arthroscopy of right shoulder 04/17/2018    MBess Harvest  PTA 12/03/18 10:33 AM   [facility] [place] [address]Phone: [phone]  Fax:  [phone] Name: GCHRISA HASSANMRN: [id number] of Birth: 521-Feb-1967

## 2018-12-06 ENCOUNTER — Other Ambulatory Visit: Payer: Self-pay

## 2018-12-06 ENCOUNTER — Ambulatory Visit: Payer: PRIVATE HEALTH INSURANCE | Admitting: Physical Therapy

## 2018-12-06 ENCOUNTER — Encounter: Payer: Self-pay | Admitting: Physical Therapy

## 2018-12-06 DIAGNOSIS — M25611 Stiffness of right shoulder, not elsewhere classified: Secondary | ICD-10-CM

## 2018-12-06 DIAGNOSIS — M6281 Muscle weakness (generalized): Secondary | ICD-10-CM

## 2018-12-06 DIAGNOSIS — M25511 Pain in right shoulder: Secondary | ICD-10-CM | POA: Diagnosis not present

## 2018-12-06 DIAGNOSIS — R29898 Other symptoms and signs involving the musculoskeletal system: Secondary | ICD-10-CM

## 2018-12-06 NOTE — Therapy (Signed)
[facility] [place] [address]  [place], [place], [address] Phone: [phone]   Fax:  [phone]  Physical Therapy Treatment  Patient Details  Name: Monica Petty MRN: [id number] Date of Birth: [birth year] Referring Provider (PT): Sydnee Cabal, MD   Encounter Date: 12/06/2018  PT End of Session - 12/06/18 1524    Visit Number  49    Number of Visits  5    Date for PT Re-Evaluation  01/09/19    Authorization Type  WC    Authorization Time Period  addition of 2x/week for 6 weeks     Authorization - Visit Number  3    Authorization - Number of Visits  12    PT Start Time  2993    PT Stop Time  1537    PT Time Calculation (min)  54 min    Activity Tolerance  Patient tolerated treatment well;Patient limited by pain    Behavior During Therapy  [facility] for tasks assessed/performed       Past Medical History:  Diagnosis Date  . Anxiety   . Diverticulosis of colon   . GERD (gastroesophageal reflux disease)    takes okra pepsin supplement  . History of concussion    1987  fell of horse--- no residual  . Hypertension   . Migraine    occasional    Past Surgical History:  Procedure Laterality Date  . COLONOSCOPY  06/06/2016  . LAPAROSCOPIC CHOLECYSTECTOMY  06-03-2008   dr Ulyess Blossom  [facility]  . REDUCTION MAMMAPLASTY Bilateral 03-05-2002   dr barber  [facility]  . SHOULDER ARTHROSCOPY WITH ROTATOR CUFF REPAIR AND SUBACROMIAL DECOMPRESSION Right 04/17/2018   Procedure: Right shoulder evaluation under anesthesia, scope, debridement, subacromial decompression, rotator cuff repair;  Surgeon: Sydnee Cabal, MD;  Location: [facility];  Service: Orthopedics;  Laterality: Right;  120 mins General with Intra Scalene Block  . VAGINAL HYSTERECTOMY  02-12-2002   dr Nori Riis  [facility]  . WISDOM TOOTH EXTRACTION  teen    There were no vitals filed for this visit.  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 Reports that she was doing a puzzle yesterday and noticed some soreness with the reaching.     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 Maximum retraction is 16.5 mm and the tear is 14 mm wide. Mild tendinopathy involving the infraspinatus tendon probable small intrasubstance tears. The subscapularis tendon is intact.    Patient Stated Goals  keep strengthening and mobility of R shoulder    Currently in Pain?  No/denies                       [facility] Adult PT Treatment/Exercise - 12/06/18 0001      Shoulder Exercises: Seated   Flexion  Strengthening;Right;10 reps;Weights    Flexion Weight (lbs)  2,3    Flexion Limitations  scaption with 5x3#, 5x2#   c/o popping in superior shoulder at ~160 deg flexion   Abduction  Strengthening;Right;10 reps;Weights    ABduction Weight (lbs)  2,3    ABduction Limitations  cues for thumb up; 5x 3#, 5x2#    Other Seated Exercises  R shoulder ball circles at 90 deg flexion 2x10 CW/CCW   c/o tightness in R superior deltoid  Other Seated Exercises  R IR/ER body blade at neutral 4x15"      Shoulder Exercises: Standing   Flexion  Strengthening;Right;10 reps;Weights    Shoulder Flexion Weight (lbs)  2,3    Flexion Limitations  5x with 3#, 5x with 2#   good scapulohumeral rhythm   Other Standing Exercises  serratus push up plus 2x10    Other Standing Exercises  B serratus roll ups with foam roll 10x5"; 2nd set with yellow TB    cues to maintain contraction in ER's     Shoulder Exercises: ROM/Strengthening   UBE (Upper Arm Bike)  Lvl 2.5, 3 min forwards/back      Cryotherapy   Number Minutes Cryotherapy  10 Minutes    Cryotherapy Location  Shoulder   R   Type of Cryotherapy  Ice pack      Electrical Stimulation   Electrical Stimulation Location  R posterior shoulder complex    Electrical  Stimulation Action  IFC    Electrical Stimulation Parameters  80-_0 ; output 13 to tolerance; 15 min    Electrical Stimulation Goals  Pain               PT Short Term Goals - 11/28/18 1543      PT SHORT TERM GOAL #1   Title  Patient to be independent with initial HEP.    Time  3    Period  Weeks    Status  Achieved        PT Long Term Goals - 11/28/18 1543      PT LONG TERM GOAL #1   Title  patient to be independent with advanced HEP    Time  6    Period  Weeks    Status  Partially Met   met for current   Target Date  01/09/19      PT LONG TERM GOAL #2   Title  patient to demonstrate full and non-painful AROM (including multi-planar motions) without pain    Time  6    Period  Weeks    Status  Partially Met   11/19/18: pt. able to demo full non-painful AROM however with "shot" of 4/10 pain with flexion at arc of 90 dg movement    Target Date  01/09/19      PT LONG TERM GOAL #3   Title  patient to improve R shoulder strength to >/= 4+/5 without pain    Time  6    Period  Weeks    Status  Partially Met   11/19/18: pt. able to demo improved R shoulder strength to 4+/5 with abduction, IR, still 4/5 strength with flexion, ER   Target Date  01/09/19      PT LONG TERM GOAL #4   Title  Patient to demonstrate overhead reaching to place 5# object on shelf overhead with <=1/10 pain.    Time  6    Period  Weeks    Status  Partially Met   11/19/18: able to lift 5# dumbell to shelf overhead however with brief "shot" of posterior/inferior shoulder pain 4/10 which recovered to pain free with rest   Target Date  01/09/19      PT LONG TERM GOAL #5   Title  Patient to report tolerance of 1 full shift at work without pain.    Time  6    Period  Weeks    Status  On-going   patient has not returned to work at this time  Target Date  01/09/19      PT LONG TERM GOAL #6   Title  Patient to report tolerance of fastening bra without pain.     Time  7    Period  Weeks    Status   Achieved   able to perform without pain           Plan - 12/06/18 1526    Clinical Impression Statement  Patient arrived to session with no new complaints. Able to perform flexion, scaption, and abduction with increased weighted resistance today. Still with mild discomfort in R deltoid and infraspinatus, but tolerable. Introduced light banded resistance with serratus roll ups with cues requiring maintained contraction in ER's. Patient with report of muscle burning in R shoulder, thus received e-stim and ice pack to R shoulder. No complaints at end of session.     PT Treatment/Interventions  ADLs/Self Care Home Management;Cryotherapy;Electrical Stimulation;Moist Heat;Therapeutic exercise;Therapeutic activities;Neuromuscular re-education;Patient/family education;Manual techniques;Vasopneumatic Device;Taping;Dry needling;Passive range of motion;Ultrasound;Splinting;Energy conservation;Scar mobilization;Iontophoresis 60m/ml Dexamethasone    PT Next Visit Plan  progress R shoulder strengthening to tolerance    Consulted and Agree with Plan of Care  Patient       Patient will benefit from skilled therapeutic intervention in order to improve the following deficits and impairments:  Pain, Impaired UE functional use, Decreased strength, Decreased range of motion, Decreased activity tolerance, Decreased scar mobility, Postural dysfunction, Impaired flexibility  Visit Diagnosis: Acute pain of right shoulder  Stiffness of right shoulder, not elsewhere classified  Other symptoms and signs involving the musculoskeletal system  Muscle weakness (generalized)     Problem List Patient Active Problem List   Diagnosis Date Noted  . S/P arthroscopy of right shoulder 04/17/2018     YJanene Harvey PT, DPT 12/06/18 3:44 PM   [facility] [address] [place] [address]Phone: [phone]  Fax:   [phone] Name: GCLARE FENNIMOREMRN: [id number] of Birth: [birth year]

## 2018-12-10 ENCOUNTER — Ambulatory Visit: Payer: PRIVATE HEALTH INSURANCE | Admitting: Physical Therapy

## 2018-12-13 ENCOUNTER — Ambulatory Visit (HOSPITAL_COMMUNITY): Payer: Self-pay

## 2018-12-13 ENCOUNTER — Encounter (HOSPITAL_COMMUNITY): Payer: Self-pay

## 2018-12-13 ENCOUNTER — Ambulatory Visit: Payer: PRIVATE HEALTH INSURANCE

## 2018-12-13 ENCOUNTER — Other Ambulatory Visit (HOSPITAL_COMMUNITY): Payer: Self-pay

## 2018-12-14 ENCOUNTER — Ambulatory Visit (HOSPITAL_COMMUNITY): Payer: Self-pay

## 2018-12-14 ENCOUNTER — Other Ambulatory Visit (HOSPITAL_COMMUNITY): Payer: Self-pay

## 2018-12-17 ENCOUNTER — Other Ambulatory Visit: Payer: Self-pay

## 2018-12-17 ENCOUNTER — Ambulatory Visit: Payer: PRIVATE HEALTH INSURANCE | Admitting: Physical Therapy

## 2018-12-17 ENCOUNTER — Ambulatory Visit: Payer: PRIVATE HEALTH INSURANCE

## 2018-12-17 DIAGNOSIS — M6281 Muscle weakness (generalized): Secondary | ICD-10-CM

## 2018-12-17 DIAGNOSIS — M25511 Pain in right shoulder: Secondary | ICD-10-CM

## 2018-12-17 DIAGNOSIS — M25611 Stiffness of right shoulder, not elsewhere classified: Secondary | ICD-10-CM

## 2018-12-17 DIAGNOSIS — R29898 Other symptoms and signs involving the musculoskeletal system: Secondary | ICD-10-CM

## 2018-12-17 NOTE — Therapy (Signed)
[facility] [address]  [place], [place], [address] Phone: [phone]   Fax:  [phone]  Physical Therapy Treatment  Patient Details  Name: Monica Petty MRN: [id number] Date of Birth: [birth year] Referring Provider (PT): Sydnee Cabal, MD   Encounter Date: 12/17/2018  PT End of Session - 12/17/18 1147    Visit Number  73    Number of Visits  61    Date for PT Re-Evaluation  01/09/19    Authorization Type  WC    Authorization Time Period  addition of 2x/week for 6 weeks     Authorization - Visit Number  4    Authorization - Number of Visits  12    PT Start Time  1144    PT Stop Time  1225    PT Time Calculation (min)  41 min    Activity Tolerance  Patient tolerated treatment well;Patient limited by pain    Behavior During Therapy  [facility] for tasks assessed/performed       Past Medical History:  Diagnosis Date  . Anxiety   . Diverticulosis of colon   . GERD (gastroesophageal reflux disease)    takes okra pepsin supplement  . History of concussion    1987  fell of horse--- no residual  . Hypertension   . Migraine    occasional    Past Surgical History:  Procedure Laterality Date  . COLONOSCOPY  06/06/2016  . LAPAROSCOPIC CHOLECYSTECTOMY  06-03-2008   dr Ulyess Blossom  [facility]  . REDUCTION MAMMAPLASTY Bilateral 03-05-2002   dr barber  [facility]  . SHOULDER ARTHROSCOPY WITH ROTATOR CUFF REPAIR AND SUBACROMIAL DECOMPRESSION Right 04/17/2018   Procedure: Right shoulder evaluation under anesthesia, scope, debridement, subacromial decompression, rotator cuff repair;  Surgeon: Sydnee Cabal, MD;  Location: [facility];  Service: Orthopedics;  Laterality: Right;  120 mins General with Intra Scalene Block  . VAGINAL HYSTERECTOMY  02-12-2002   dr Nori Riis  [facility]  . WISDOM TOOTH EXTRACTION  teen    There were no vitals filed for this visit.  Subjective Assessment - 12/17/18 1146    Subjective  Pt. reporting  still having pain with reaching movements with wt.    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 Maximum retraction is 16.5 mm and the tear is 14 mm wide. Mild tendinopathy involving the infraspinatus tendon probable small intrasubstance tears. The subscapularis tendon is intact.    Patient Stated Goals  keep strengthening and mobility of R shoulder    Currently in Pain?  No/denies    Pain Score  0-No pain    Multiple Pain Sites  No                       [facility] Adult PT Treatment/Exercise - 12/17/18 1153      Shoulder Exercises: Supine   Protraction  Right;20 reps    Protraction Weight (lbs)  5      Shoulder Exercises: Standing   Horizontal ABduction  Both;10 reps;Theraband;Strengthening    Theraband Level (Shoulder Horizontal ABduction)  Level 2 (Red)    Horizontal ABduction Limitations  at doorseal + scapular retraction     External Rotation  20 reps;Both;Theraband;Strengthening    Theraband Level (Shoulder External Rotation)  Level 2 (Red)    External Rotation Limitations  at doorseal + scapular retraction     Flexion  10 reps;Both;Strengthening  Shoulder Flexion Weight (lbs)  3    ABduction  10 reps;Both;Strengthening    Shoulder ABduction Weight (lbs)  3    Diagonals  Right;Left;15 reps;Theraband    Theraband Level (Shoulder Diagonals)  Level 2 (Red)    Diagonals Limitations  Alternating shoulder flexion/extension with red TB (X)    Other Standing Exercises  Bent over shoulder extension 3# x 15 reps     Other Standing Exercises  Bent over 5# dumbbell row 2" hold x 15 resp       Shoulder Exercises: ROM/Strengthening   UBE (Upper Arm Bike)  Lvl 3.0, 3 min forwards/back    Cybex Row  10 reps   2 sets   Cybex Row Limitations  25# low handles; 20# mid handles     Wall Pushups  15 reps    Wall Pushups Limitations  wall       Shoulder Exercises: Stretch   Internal Rotation Stretch  30 seconds    2 x 30 sec sleeper stretch               PT Short Term Goals - 11/28/18 1543      PT SHORT TERM GOAL #1   Title  Patient to be independent with initial HEP.    Time  3    Period  Weeks    Status  Achieved        PT Long Term Goals - 11/28/18 1543      PT LONG TERM GOAL #1   Title  patient to be independent with advanced HEP    Time  6    Period  Weeks    Status  Partially Met   met for current   Target Date  01/09/19      PT LONG TERM GOAL #2   Title  patient to demonstrate full and non-painful AROM (including multi-planar motions) without pain    Time  6    Period  Weeks    Status  Partially Met   11/19/18: pt. able to demo full non-painful AROM however with "shot" of 4/10 pain with flexion at arc of 90 dg movement    Target Date  01/09/19      PT LONG TERM GOAL #3   Title  patient to improve R shoulder strength to >/= 4+/5 without pain    Time  6    Period  Weeks    Status  Partially Met   11/19/18: pt. able to demo improved R shoulder strength to 4+/5 with abduction, IR, still 4/5 strength with flexion, ER   Target Date  01/09/19      PT LONG TERM GOAL #4   Title  Patient to demonstrate overhead reaching to place 5# object on shelf overhead with <=1/10 pain.    Time  6    Period  Weeks    Status  Partially Met   11/19/18: able to lift 5# dumbell to shelf overhead however with brief "shot" of posterior/inferior shoulder pain 4/10 which recovered to pain free with rest   Target Date  01/09/19      PT LONG TERM GOAL #5   Title  Patient to report tolerance of 1 full shift at work without pain.    Time  6    Period  Weeks    Status  On-going   patient has not returned to work at this time   Target Date  01/09/19      PT LONG TERM GOAL #6  Title  Patient to report tolerance of fastening bra without pain.     Time  7    Period  Weeks    Status  Achieved   able to perform without pain           Plan - 12/17/18 1149    Clinical Impression  Statement  Pt. doing well today.  Notes new work assignment tasks have not been painful for R shoulder and tolerating work well at this point.  Feels reaching out to side and to shoulder height with weight still bothers shoulder most.  Tolerated progression of flexion and scaption strengthening activities well today with additional progression of scapular strengthening with machines.  Ended visit pain free only noting R shoulder fatigue.  Modalities deferred.      Rehab Potential  Good    PT Treatment/Interventions  ADLs/Self Care Home Management;Cryotherapy;Electrical Stimulation;Moist Heat;Therapeutic exercise;Therapeutic activities;Neuromuscular re-education;Patient/family education;Manual techniques;Vasopneumatic Device;Taping;Dry needling;Passive range of motion;Ultrasound;Splinting;Energy conservation;Scar mobilization;Iontophoresis 16m/ml Dexamethasone    PT Next Visit Plan  progress R shoulder strengthening to tolerance    Consulted and Agree with Plan of Care  Patient       Patient will benefit from skilled therapeutic intervention in order to improve the following deficits and impairments:  Pain, Impaired UE functional use, Decreased strength, Decreased range of motion, Decreased activity tolerance, Decreased scar mobility, Postural dysfunction, Impaired flexibility  Visit Diagnosis: Acute pain of right shoulder  Stiffness of right shoulder, not elsewhere classified  Other symptoms and signs involving the musculoskeletal system  Muscle weakness (generalized)     Problem List Patient Active Problem List   Diagnosis Date Noted  . S/P arthroscopy of right shoulder 04/17/2018    MBess Harvest PTA 12/17/18 12:54 PM   [facility] [place] [address]Phone: [phone]  Fax:  [phone] Name: GBETTY DAIDONEMRN: [id number] of Birth: 511-23-1967

## 2018-12-20 ENCOUNTER — Ambulatory Visit: Payer: PRIVATE HEALTH INSURANCE

## 2018-12-21 ENCOUNTER — Other Ambulatory Visit: Payer: Self-pay

## 2018-12-21 ENCOUNTER — Ambulatory Visit: Payer: PRIVATE HEALTH INSURANCE | Attending: Nurse Practitioner

## 2018-12-21 DIAGNOSIS — M25511 Pain in right shoulder: Secondary | ICD-10-CM | POA: Insufficient documentation

## 2018-12-21 DIAGNOSIS — M6281 Muscle weakness (generalized): Secondary | ICD-10-CM | POA: Diagnosis present

## 2018-12-21 DIAGNOSIS — M25611 Stiffness of right shoulder, not elsewhere classified: Secondary | ICD-10-CM | POA: Insufficient documentation

## 2018-12-21 DIAGNOSIS — R29898 Other symptoms and signs involving the musculoskeletal system: Secondary | ICD-10-CM | POA: Diagnosis present

## 2018-12-21 NOTE — Therapy (Addendum)
[facility] [address]  [address] [place], [place], [address] Phone: [phone]   Fax:  [phone]  Physical Therapy Treatment  Patient Details  Name: Monica Petty MRN: [id number] Date of Birth: 05/18/1966 Referring Provider (PT): Robert Collins, MD   Encounter Date: 12/21/2018  PT End of Session - 12/21/18 0849    Visit Number  56    Number of Visits  64    Date for PT Re-Evaluation  01/09/19    Authorization Type  WC    Authorization Time Period  addition of 2x/week for 6 weeks     Authorization - Visit Number  5    Authorization - Number of Visits  12    PT Start Time  0845    PT Stop Time  0945    PT Time Calculation (min)  60 min    Activity Tolerance  Patient tolerated treatment well;Patient limited by pain    Behavior During Therapy  [facility] for tasks assessed/performed       Past Medical History:  Diagnosis Date  . Anxiety   . Diverticulosis of colon   . GERD (gastroesophageal reflux disease)    takes okra pepsin supplement  . History of concussion    1987  fell of horse--- no residual  . Hypertension   . Migraine    occasional    Past Surgical History:  Procedure Laterality Date  . COLONOSCOPY  06/06/2016  . LAPAROSCOPIC CHOLECYSTECTOMY  06-03-2008   dr rosebower  [facility]  . REDUCTION MAMMAPLASTY Bilateral 03-05-2002   dr barber  [facility]  . SHOULDER ARTHROSCOPY WITH ROTATOR CUFF REPAIR AND SUBACROMIAL DECOMPRESSION Right 04/17/2018   Procedure: Right shoulder evaluation under anesthesia, scope, debridement, subacromial decompression, rotator cuff repair;  Surgeon: Collins, Robert, MD;  Location: [facility];  Service: Orthopedics;  Laterality: Right;  120 mins General with Intra Scalene Block  . VAGINAL HYSTERECTOMY  02-12-2002   dr neal  [facility]  . WISDOM TOOTH EXTRACTION  teen    There were no vitals filed for this visit.  Subjective Assessment - 12/21/18 0847    Subjective  Pt. reporting  she had some R shoulder soreness after working with laundry, folding and unloading a few days ago.  Feels she tolerated last session well.  Pt. noting some R shoulder pain when donning/doffing pants in mornings.      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 Maximum retraction is 16.5 mm and the tear is 14 mm wide. Mild tendinopathy involving the infraspinatus tendon probable small intrasubstance tears. The subscapularis tendon is intact.    Patient Stated Goals  keep strengthening and mobility of R shoulder    Currently in Pain?  No/denies    Pain Score  0-No pain    Multiple Pain Sites  No                       [facility] Adult PT Treatment/Exercise - 12/21/18 0858      Shoulder Exercises: Supine   Protraction  Right;20 reps   2 sets    Protraction Weight (lbs)  5      Shoulder Exercises: Standing   Other Standing Exercises  R D1/D2 flexion/extension with yellow TB x 15 reps       Shoulder Exercises: ROM/Strengthening   UBE (Upper Arm Bike)  Lvl 3.0, 3 min forwards/back        Electrical Stimulation   Electrical Stimulation Location  R posterior shoulder complex    Electrical Stimulation Action  IFC    Electrical Stimulation Parameters  to tolerance, 15'    Electrical Stimulation Goals  Pain      Vasopneumatic   Number Minutes Vasopneumatic   15 minutes    Vasopnuematic Location   Shoulder    Vasopneumatic Pressure  Low    Vasopneumatic Temperature   Coldest      Manual Therapy   Manual Therapy  Soft tissue mobilization;Myofascial release    Manual therapy comments  Supine    Soft tissue mobilization  STM to R infra, teres group in area of tenderness    Myofascial Release  TPR to R distal infraspinatus              PT Education - 12/21/18 1006    Education provided  Yes    Education Details  HEP update    Person(s) Educated  Patient    Methods  Explanation;Demonstration;Verbal  cues;Handout    Comprehension  Verbalized understanding;Returned demonstration;Verbal cues required;Need further instruction       PT Short Term Goals - 11/28/18 1543      PT SHORT TERM GOAL #1   Title  Patient to be independent with initial HEP.    Time  3    Period  Weeks    Status  Achieved        PT Long Term Goals - 11/28/18 1543      PT LONG TERM GOAL #1   Title  patient to be independent with advanced HEP    Time  6    Period  Weeks    Status  Partially Met   met for current   Target Date  01/09/19      PT LONG TERM GOAL #2   Title  patient to demonstrate full and non-painful AROM (including multi-planar motions) without pain    Time  6    Period  Weeks    Status  Partially Met   11/19/18: pt. able to demo full non-painful AROM however with "shot" of 4/10 pain with flexion at arc of 90 dg movement    Target Date  01/09/19      PT LONG TERM GOAL #3   Title  patient to improve R shoulder strength to >/= 4+/5 without pain    Time  6    Period  Weeks    Status  Partially Met   11/19/18: pt. able to demo improved R shoulder strength to 4+/5 with abduction, IR, still 4/5 strength with flexion, ER   Target Date  01/09/19      PT LONG TERM GOAL #4   Title  Patient to demonstrate overhead reaching to place 5# object on shelf overhead with <=1/10 pain.    Time  6    Period  Weeks    Status  Partially Met   11/19/18: able to lift 5# dumbell to shelf overhead however with brief "shot" of posterior/inferior shoulder pain 4/10 which recovered to pain free with rest   Target Date  01/09/19      PT LONG TERM GOAL #5   Title  Patient to report tolerance of 1 full shift at work without pain.    Time  6    Period  Weeks    Status  On-going   patient has not returned to work at this time   Target Date  01/09/19        PT LONG TERM GOAL #6   Title  Patient to report tolerance of fastening bra without pain.     Time  7    Period  Weeks    Status  Achieved   able to perform  without pain           Plan - 12/21/18 0850    Clinical Impression Statement  Tarae reporting she felt fine after last session however had some R shoulder pain after folding laundry a few days ago.  Session focused on progression of functional diagonal strengthening activities with yellow TB resistance with pt. tolerating well.  Did require some cueing to avoid end range painful arc.   MT addressing ongoing tenderness/tightness in R posterior/inferior shoulder with good response.  Ended session with low-level R shoulder soreness thus applied E-stim/ice with good relief following.  HEP updated.  Will monitor tolerance to updated HEP and continue to progress toward LTGs in coming sessions.      Rehab Potential  Good    PT Treatment/Interventions  ADLs/Self Care Home Management;Cryotherapy;Electrical Stimulation;Moist Heat;Therapeutic exercise;Therapeutic activities;Neuromuscular re-education;Patient/family education;Manual techniques;Vasopneumatic Device;Taping;Dry needling;Passive range of motion;Ultrasound;Splinting;Energy conservation;Scar mobilization;Iontophoresis 23m/ml Dexamethasone    PT Next Visit Plan  progress R shoulder strengthening to tolerance    Consulted and Agree with Plan of Care  Patient       Patient will benefit from skilled therapeutic intervention in order to improve the following deficits and impairments:  Pain, Impaired UE functional use, Decreased strength, Decreased range of motion, Decreased activity tolerance, Decreased scar mobility, Postural dysfunction, Impaired flexibility  Visit Diagnosis: Acute pain of right shoulder  Stiffness of right shoulder, not elsewhere classified  Other symptoms and signs involving the musculoskeletal system  Muscle weakness (generalized)     Problem List Patient Active Problem List   Diagnosis Date Noted  . S/P arthroscopy of right shoulder 04/17/2018    MBess Harvest PTA 12/21/18 10:08 AM   [facility] [address] [place] [address]Phone: [phone]  Fax:  [phone] Name: GDASHANTI BURRMRN: [id number] of Birth: [birth year]

## 2018-12-24 ENCOUNTER — Other Ambulatory Visit: Payer: Self-pay

## 2018-12-24 ENCOUNTER — Ambulatory Visit: Payer: PRIVATE HEALTH INSURANCE

## 2018-12-24 DIAGNOSIS — M6281 Muscle weakness (generalized): Secondary | ICD-10-CM

## 2018-12-24 DIAGNOSIS — M25611 Stiffness of right shoulder, not elsewhere classified: Secondary | ICD-10-CM

## 2018-12-24 DIAGNOSIS — R29898 Other symptoms and signs involving the musculoskeletal system: Secondary | ICD-10-CM

## 2018-12-24 DIAGNOSIS — M25511 Pain in right shoulder: Secondary | ICD-10-CM

## 2018-12-24 NOTE — Therapy (Signed)
[facility] [address]  [place], [place], [address] Phone: [phone]   Fax:  [phone]  Physical Therapy Treatment  Patient Details  Name: Monica Petty MRN: [id number] Date of Birth: Apr 30, 1966 Referring Provider (PT): Sydnee Cabal, MD   Encounter Date: 12/24/2018  PT End of Session - 12/24/18 1318    Visit Number  47    Number of Visits  77    Date for PT Re-Evaluation  01/09/19    Authorization Type  WC    Authorization Time Period  addition of 2x/week for 6 weeks     Authorization - Visit Number  6    Authorization - Number of Visits  12    PT Start Time  1497    PT Stop Time  1353    PT Time Calculation (min)  40 min    Activity Tolerance  Patient tolerated treatment well;Patient limited by pain    Behavior During Therapy  [facility] for tasks assessed/performed       Past Medical History:  Diagnosis Date  . Anxiety   . Diverticulosis of colon   . GERD (gastroesophageal reflux disease)    takes okra pepsin supplement  . History of concussion    1987  fell of horse--- no residual  . Hypertension   . Migraine    occasional    Past Surgical History:  Procedure Laterality Date  . COLONOSCOPY  06/06/2016  . LAPAROSCOPIC CHOLECYSTECTOMY  06-03-2008   dr Ulyess Blossom  [facility]  . REDUCTION MAMMAPLASTY Bilateral 03-05-2002   dr barber  [facility]  . SHOULDER ARTHROSCOPY WITH ROTATOR CUFF REPAIR AND SUBACROMIAL DECOMPRESSION Right 04/17/2018   Procedure: Right shoulder evaluation under anesthesia, scope, debridement, subacromial decompression, rotator cuff repair;  Surgeon: Sydnee Cabal, MD;  Location: [facility];  Service: Orthopedics;  Laterality: Right;  120 mins General with Intra Scalene Block  . VAGINAL HYSTERECTOMY  02-12-2002   dr Nori Riis  [facility]  . WISDOM TOOTH EXTRACTION  teen    There were no vitals filed for this visit.  Subjective Assessment - 12/24/18 1315    Subjective  Pt. reporting  some soreness for a day after last session.  Also had some soreness after riding motorcyles behind husband as passenger and, "sleeping on it wrong".      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 Maximum retraction is 16.5 mm and the tear is 14 mm wide. Mild tendinopathy involving the infraspinatus tendon probable small intrasubstance tears. The subscapularis tendon is intact.    Patient Stated Goals  keep strengthening and mobility of R shoulder    Currently in Pain?  Yes    Pain Score  1     Pain Location  Shoulder    Pain Orientation  Right;Lateral    Pain Descriptors / Indicators  Burning    Pain Type  Surgical pain    Multiple Pain Sites  No                       [facility] Adult PT Treatment/Exercise - 12/24/18 1330      Shoulder Exercises: Supine   Protraction  Right;20 reps    Protraction Weight (lbs)  6    Horizontal ABduction  Both;15 reps;Theraband    Theraband Level (Shoulder Horizontal ABduction)  Level 3 (Green)    Horizontal ABduction Limitations  Hooklying on pool noodle  External Rotation  Both;15 reps    Theraband Level (Shoulder External Rotation)  Level 2 (Red)    External Rotation Limitations  Hooklying on pool noodle     Flexion  Left;Right;15 reps;Theraband;Strengthening    Theraband Level (Shoulder Flexion)  Level 2 (Red)    Flexion Limitations  Alternating flexion/extension with red TB laying on pool noodle       Shoulder Exercises: Seated   Abduction  Both;15 reps    ABduction Weight (lbs)  3    ABduction Limitations  scaption       Shoulder Exercises: ROM/Strengthening   UBE (Upper Arm Bike)  Lvl 3.5, 3 min forwards/back    Lat Pull  20 reps    Lat Pull Limitations  20#    Cybex Row  10 reps   2 sets    Cybex Row Limitations  25# low handles; 25# mid handles     Wall Pushups  15 reps    Wall Pushups Limitations  orange p-ball on wall       Shoulder Exercises:  Stretch   Corner Stretch  2 reps;30 seconds    Corner Stretch Limitations  mid and high stretch     Other Shoulder Stretches  B rhomboids x 30 sec       Electrical Stimulation   Electrical Stimulation Location  R posterior shoulder complex    Electrical Stimulation Action  IFC    Electrical Stimulation Parameters  to tolerance, 15'    Electrical Stimulation Goals  Pain               PT Short Term Goals - 11/28/18 1543      PT SHORT TERM GOAL #1   Title  Patient to be independent with initial HEP.    Time  3    Period  Weeks    Status  Achieved        PT Long Term Goals - 11/28/18 1543      PT LONG TERM GOAL #1   Title  patient to be independent with advanced HEP    Time  6    Period  Weeks    Status  Partially Met   met for current   Target Date  01/09/19      PT LONG TERM GOAL #2   Title  patient to demonstrate full and non-painful AROM (including multi-planar motions) without pain    Time  6    Period  Weeks    Status  Partially Met   11/19/18: pt. able to demo full non-painful AROM however with "shot" of 4/10 pain with flexion at arc of 90 dg movement    Target Date  01/09/19      PT LONG TERM GOAL #3   Title  patient to improve R shoulder strength to >/= 4+/5 without pain    Time  6    Period  Weeks    Status  Partially Met   11/19/18: pt. able to demo improved R shoulder strength to 4+/5 with abduction, IR, still 4/5 strength with flexion, ER   Target Date  01/09/19      PT LONG TERM GOAL #4   Title  Patient to demonstrate overhead reaching to place 5# object on shelf overhead with <=1/10 pain.    Time  6    Period  Weeks    Status  Partially Met   11/19/18: able to lift 5# dumbell to shelf overhead however with brief "shot" of posterior/inferior shoulder pain  4/10 which recovered to pain free with rest   Target Date  01/09/19      PT LONG TERM GOAL #5   Title  Patient to report tolerance of 1 full shift at work without pain.    Time  6    Period   Weeks    Status  On-going   patient has not returned to work at this time   Target Date  01/09/19      PT LONG TERM GOAL #6   Title  Patient to report tolerance of fastening bra without pain.     Time  7    Period  Weeks    Status  Achieved   able to perform without pain           Plan - 12/24/18 1325    Clinical Impression Statement  Monica Petty doing well today reporting one day of soreness after last session which later subsided.  Reports some R shoulder soreness today after riding passenger on motorcycle with husband over weekend.  Tolerated session well today with mild progression in elevation activities.  Pt. still demonstrating audible R shoulder "popping" occasionally which is pain free on return movement with scaption 3# raises.  Ended session with pt. reporting R shoulder fatigue and mild discomfort thus applied E-stim per pt. request to reduce post-exercise pain.  Will continue to progress toward goals.      Rehab Potential  Good    PT Treatment/Interventions  ADLs/Self Care Home Management;Cryotherapy;Electrical Stimulation;Moist Heat;Therapeutic exercise;Therapeutic activities;Neuromuscular re-education;Patient/family education;Manual techniques;Vasopneumatic Device;Taping;Dry needling;Passive range of motion;Ultrasound;Splinting;Energy conservation;Scar mobilization;Iontophoresis 40m/ml Dexamethasone    PT Next Visit Plan  progress R shoulder strengthening to tolerance    Consulted and Agree with Plan of Care  Patient       Patient will benefit from skilled therapeutic intervention in order to improve the following deficits and impairments:  Pain, Impaired UE functional use, Decreased strength, Decreased range of motion, Decreased activity tolerance, Decreased scar mobility, Postural dysfunction, Impaired flexibility  Visit Diagnosis: Acute pain of right shoulder  Stiffness of right shoulder, not elsewhere classified  Other symptoms and signs involving the musculoskeletal  system  Muscle weakness (generalized)     Problem List Patient Active Problem List   Diagnosis Date Noted  . S/P arthroscopy of right shoulder 04/17/2018    MBess Harvest PTA 12/24/18 2:22 PM    [facility] [address] [place] [address]Phone: [phone]  Fax:  [phone] Name: Monica DARCEYMRN: [id number] of Birth: 509-30-1967

## 2018-12-27 ENCOUNTER — Encounter: Payer: Self-pay | Admitting: Physical Therapy

## 2018-12-27 ENCOUNTER — Other Ambulatory Visit: Payer: Self-pay

## 2018-12-27 ENCOUNTER — Ambulatory Visit: Payer: PRIVATE HEALTH INSURANCE | Admitting: Physical Therapy

## 2018-12-27 DIAGNOSIS — M6281 Muscle weakness (generalized): Secondary | ICD-10-CM

## 2018-12-27 DIAGNOSIS — M25511 Pain in right shoulder: Secondary | ICD-10-CM

## 2018-12-27 DIAGNOSIS — R29898 Other symptoms and signs involving the musculoskeletal system: Secondary | ICD-10-CM

## 2018-12-27 DIAGNOSIS — M25611 Stiffness of right shoulder, not elsewhere classified: Secondary | ICD-10-CM

## 2018-12-27 NOTE — Therapy (Signed)
[facility] [address]  [place], [place], [address] Phone: [phone]   Fax:  [phone]  Physical Therapy Treatment  Patient Details  Name: Monica Petty MRN: [id number] Date of Birth: 03-14-1966 Referring Provider (PT): Sydnee Cabal, MD   Encounter Date: 12/27/2018  PT End of Session - 12/27/18 1413    Visit Number  67    Number of Visits  75    Date for PT Re-Evaluation  01/09/19    Authorization Type  WC    Authorization Time Period  addition of 2x/week for 6 weeks     Authorization - Visit Number  7    Authorization - Number of Visits  12    PT Start Time  1314    PT Stop Time  1400    PT Time Calculation (min)  46 min    Activity Tolerance  Patient tolerated treatment well;Patient limited by pain    Behavior During Therapy  [facility] for tasks assessed/performed       Past Medical History:  Diagnosis Date  . Anxiety   . Diverticulosis of colon   . GERD (gastroesophageal reflux disease)    takes okra pepsin supplement  . History of concussion    1987  fell of horse--- no residual  . Hypertension   . Migraine    occasional    Past Surgical History:  Procedure Laterality Date  . COLONOSCOPY  06/06/2016  . LAPAROSCOPIC CHOLECYSTECTOMY  06-03-2008   dr Ulyess Blossom  [facility]  . REDUCTION MAMMAPLASTY Bilateral 03-05-2002   dr barber  [facility]  . SHOULDER ARTHROSCOPY WITH ROTATOR CUFF REPAIR AND SUBACROMIAL DECOMPRESSION Right 04/17/2018   Procedure: Right shoulder evaluation under anesthesia, scope, debridement, subacromial decompression, rotator cuff repair;  Surgeon: Sydnee Cabal, MD;  Location: [facility];  Service: Orthopedics;  Laterality: Right;  120 mins General with Intra Scalene Block  . VAGINAL HYSTERECTOMY  02-12-2002   dr Nori Riis  [facility]  . WISDOM TOOTH EXTRACTION  teen    There were no vitals filed for this visit.  Subjective Assessment - 12/27/18 1316    Subjective  Reports that  her shoulder has been a bit sore after last session and has backed off on the HEP a bit as a result.     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 Maximum retraction is 16.5 mm and the tear is 14 mm wide. Mild tendinopathy involving the infraspinatus tendon probable small intrasubstance tears. The subscapularis tendon is intact.    Patient Stated Goals  keep strengthening and mobility of R shoulder    Currently in Pain?  Yes    Pain Score  1     Pain Location  Shoulder    Pain Orientation  Right    Pain Descriptors / Indicators  Dull    Pain Type  Surgical pain                       [facility] Adult PT Treatment/Exercise - 12/27/18 0001      Shoulder Exercises: Seated   Flexion  Strengthening;Right;10 reps;Weights   unable to tolerate 2# without pain   Flexion Weight (lbs)  1    Abduction  Strengthening;Right;10 reps;Weights    ABduction Weight (lbs)  1    Other Seated Exercises  R shoulder scaption x10 1#      Shoulder Exercises: ROM/Strengthening   UBE (  Upper Arm Bike)  Lvl 3.5, 3 min forwards/back    Lat Pull  15 reps    Lat Pull Limitations  x15 20#   cues for shoulder depression   Cybex Row  15 reps    Cybex Row Limitations  1st set 25#, 2nd set 20#;  low handles   cues to maintain elbows in line with torso   Other ROM/Strengthening Exercises  B BATCA serratus punch 2x15 20#      Iontophoresis   Type of Iontophoresis  Dexamethasone    Location  R posterior shoulder    Dose  80 mA*min; 1.0 ml    Time  4-6 hour patch   #1/6     Manual Therapy   Manual Therapy  Soft tissue mobilization;Myofascial release    Manual therapy comments  L sidelying    Soft tissue mobilization  STM to R infra, teres group in area of tenderness   to tolerance            PT Education - 12/27/18 1420    Education provided  Yes    Education Details  educated on ionto precautions, contraindications, and wear  time- reported understanding. No complaints at end of session.     Person(s) Educated  Patient    Methods  Explanation;Handout    Comprehension  Verbalized understanding       PT Short Term Goals - 11/28/18 1543      PT SHORT TERM GOAL #1   Title  Patient to be independent with initial HEP.    Time  3    Period  Weeks    Status  Achieved        PT Long Term Goals - 11/28/18 1543      PT LONG TERM GOAL #1   Title  patient to be independent with advanced HEP    Time  6    Period  Weeks    Status  Partially Met   met for current   Target Date  01/09/19      PT LONG TERM GOAL #2   Title  patient to demonstrate full and non-painful AROM (including multi-planar motions) without pain    Time  6    Period  Weeks    Status  Partially Met   11/19/18: pt. able to demo full non-painful AROM however with "shot" of 4/10 pain with flexion at arc of 90 dg movement    Target Date  01/09/19      PT LONG TERM GOAL #3   Title  patient to improve R shoulder strength to >/= 4+/5 without pain    Time  6    Period  Weeks    Status  Partially Met   11/19/18: pt. able to demo improved R shoulder strength to 4+/5 with abduction, IR, still 4/5 strength with flexion, ER   Target Date  01/09/19      PT LONG TERM GOAL #4   Title  Patient to demonstrate overhead reaching to place 5# object on shelf overhead with <=1/10 pain.    Time  6    Period  Weeks    Status  Partially Met   11/19/18: able to lift 5# dumbell to shelf overhead however with brief "shot" of posterior/inferior shoulder pain 4/10 which recovered to pain free with rest   Target Date  01/09/19      PT LONG TERM GOAL #5   Title  Patient to report tolerance of 1 full shift at work  without pain.    Time  6    Period  Weeks    Status  On-going   patient has not returned to work at this time   Target Date  01/09/19      PT LONG TERM GOAL #6   Title  Patient to report tolerance of fastening bra without pain.     Time  7    Period   Weeks    Status  Achieved   able to perform without pain           Plan - 12/27/18 1415    Clinical Impression Statement  Patient arrived to session with report of increased R shoulder soreness after last session. Patient reporting that she has been easing off on HEP d/t this pain- advised patient to continue HEP per tolerance. Worked on progressive periscapular machine strengthening with patient reporting good tolerance during exercises; did mention one instance of R shoulder pain when bringing arm down from elevation once finished with lat pulldowns. Decreased weight with R shoulder flexion, scaption, and abduction- patient still with good form but with intermittent painful popping as she has previously mentioned in past sessions. Ended session with STM to R posterior shoulder in area of pain, as well as placement of iontophoresis patch. Patient educated on ionto precautions, contraindications, and wear time- reported understanding. No complaints at end of session.     PT Treatment/Interventions  ADLs/Self Care Home Management;Cryotherapy;Electrical Stimulation;Moist Heat;Therapeutic exercise;Therapeutic activities;Neuromuscular re-education;Patient/family education;Manual techniques;Vasopneumatic Device;Taping;Dry needling;Passive range of motion;Ultrasound;Splinting;Energy conservation;Scar mobilization;Iontophoresis 8m/ml Dexamethasone    PT Next Visit Plan  assess benefit from ionto; progress R shoulder strengthening to tolerance    Consulted and Agree with Plan of Care  Patient       Patient will benefit from skilled therapeutic intervention in order to improve the following deficits and impairments:  Pain, Impaired UE functional use, Decreased strength, Decreased range of motion, Decreased activity tolerance, Decreased scar mobility, Postural dysfunction, Impaired flexibility  Visit Diagnosis: Acute pain of right shoulder  Stiffness of right shoulder, not elsewhere  classified  Other symptoms and signs involving the musculoskeletal system  Muscle weakness (generalized)     Problem List Patient Active Problem List   Diagnosis Date Noted  . S/P arthroscopy of right shoulder 04/17/2018    YJanene Harvey PT, DPT 12/27/18 2:27 PM   [facility] [address] [place] [address]Phone: [phone]  Fax:  [phone] Name: Monica LEONMRN: [id number] of Birth: 5Jun 21, 1967

## 2018-12-31 ENCOUNTER — Ambulatory Visit: Payer: PRIVATE HEALTH INSURANCE

## 2018-12-31 ENCOUNTER — Other Ambulatory Visit: Payer: Self-pay

## 2018-12-31 DIAGNOSIS — R29898 Other symptoms and signs involving the musculoskeletal system: Secondary | ICD-10-CM

## 2018-12-31 DIAGNOSIS — M25511 Pain in right shoulder: Secondary | ICD-10-CM

## 2018-12-31 DIAGNOSIS — M6281 Muscle weakness (generalized): Secondary | ICD-10-CM

## 2018-12-31 DIAGNOSIS — M25611 Stiffness of right shoulder, not elsewhere classified: Secondary | ICD-10-CM

## 2018-12-31 NOTE — Therapy (Signed)
[facility] [address]  [place], [place], [address] Phone: [phone]   Fax:  [phone]  Physical Therapy Treatment  Patient Details  Name: Monica Petty MRN: [id number] Date of Birth: [birth year] Referring Provider (PT): Sydnee Cabal, MD   Encounter Date: 12/31/2018  PT End of Session - 12/31/18 1323    Visit Number  44    Number of Visits  62    Date for PT Re-Evaluation  01/09/19    Authorization Type  WC    Authorization Time Period  addition of 2x/week for 6 weeks     Authorization - Visit Number  8    Authorization - Number of Visits  12    PT Start Time  5809    PT Stop Time  1400    PT Time Calculation (min)  43 min    Activity Tolerance  Patient tolerated treatment well;Patient limited by pain    Behavior During Therapy  [facility] for tasks assessed/performed       Past Medical History:  Diagnosis Date  . Anxiety   . Diverticulosis of colon   . GERD (gastroesophageal reflux disease)    takes okra pepsin supplement  . History of concussion    1987  fell of horse--- no residual  . Hypertension   . Migraine    occasional    Past Surgical History:  Procedure Laterality Date  . COLONOSCOPY  06/06/2016  . LAPAROSCOPIC CHOLECYSTECTOMY  06-03-2008   dr Ulyess Blossom  [facility]  . REDUCTION MAMMAPLASTY Bilateral 03-05-2002   dr barber  [facility]  . SHOULDER ARTHROSCOPY WITH ROTATOR CUFF REPAIR AND SUBACROMIAL DECOMPRESSION Right 04/17/2018   Procedure: Right shoulder evaluation under anesthesia, scope, debridement, subacromial decompression, rotator cuff repair;  Surgeon: Sydnee Cabal, MD;  Location: [facility];  Service: Orthopedics;  Laterality: Right;  120 mins General with Intra Scalene Block  . VAGINAL HYSTERECTOMY  02-12-2002   dr Nori Riis  [facility]  . WISDOM TOOTH EXTRACTION  teen    There were no vitals filed for this visit.  Subjective Assessment - 12/31/18 1321    Subjective  reports relief  from ionto patch applied last session.      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 Maximum retraction is 16.5 mm and the tear is 14 mm wide. Mild tendinopathy involving the infraspinatus tendon probable small intrasubstance tears. The subscapularis tendon is intact.    Patient Stated Goals  keep strengthening and mobility of R shoulder    Currently in Pain?  Yes    Pain Score  2     Pain Location  Shoulder    Pain Orientation  Right    Pain Descriptors / Indicators  Dull    Pain Type  Surgical pain                       [facility] Adult PT Treatment/Exercise - 12/31/18 1331      Shoulder Exercises: Supine   Protraction  Right;20 reps    Protraction Weight (lbs)  6    External Rotation  Both;15 reps    Theraband Level (Shoulder External Rotation)  Level 1 (Yellow)      Shoulder Exercises: Standing   External Rotation  20 reps;Both;Theraband;Strengthening    Theraband Level (Shoulder External Rotation)  Level 1 (Yellow)    External Rotation Limitations  elbow in neutral  Shoulder Exercises: ROM/Strengthening   UBE (Upper Arm Bike)  Lvl 3.5, 3 min forwards/back    Lat Pull  15 reps    Lat Pull Limitations  20#    Wall Pushups  20 reps    Wall Pushups Limitations  wall       Shoulder Exercises: Stretch   Corner Stretch  2 reps;30 seconds    Corner Stretch Limitations  mid chest stretch       Manual Therapy   Manual Therapy  Soft tissue mobilization;Myofascial release    Manual therapy comments  L sidelying    Soft tissue mobilization  STM to R infra, teres group in area of tenderness               PT Short Term Goals - 11/28/18 1543      PT SHORT TERM GOAL #1   Title  Patient to be independent with initial HEP.    Time  3    Period  Weeks    Status  Achieved        PT Long Term Goals - 11/28/18 1543      PT LONG TERM GOAL #1   Title  patient to be independent with  advanced HEP    Time  6    Period  Weeks    Status  Partially Met   met for current   Target Date  01/09/19      PT LONG TERM GOAL #2   Title  patient to demonstrate full and non-painful AROM (including multi-planar motions) without pain    Time  6    Period  Weeks    Status  Partially Met   11/19/18: pt. able to demo full non-painful AROM however with "shot" of 4/10 pain with flexion at arc of 90 dg movement    Target Date  01/09/19      PT LONG TERM GOAL #3   Title  patient to improve R shoulder strength to >/= 4+/5 without pain    Time  6    Period  Weeks    Status  Partially Met   11/19/18: pt. able to demo improved R shoulder strength to 4+/5 with abduction, IR, still 4/5 strength with flexion, ER   Target Date  01/09/19      PT LONG TERM GOAL #4   Title  Patient to demonstrate overhead reaching to place 5# object on shelf overhead with <=1/10 pain.    Time  6    Period  Weeks    Status  Partially Met   11/19/18: able to lift 5# dumbell to shelf overhead however with brief "shot" of posterior/inferior shoulder pain 4/10 which recovered to pain free with rest   Target Date  01/09/19      PT LONG TERM GOAL #5   Title  Patient to report tolerance of 1 full shift at work without pain.    Time  6    Period  Weeks    Status  On-going   patient has not returned to work at this time   Target Date  01/09/19      PT LONG TERM GOAL #6   Title  Patient to report tolerance of fastening bra without pain.     Time  7    Period  Weeks    Status  Achieved   able to perform without pain           Plan - 12/31/18 1326    Clinical  Impression Statement  Pt. reporting benefit from ionto patch applied last visit with improved R shoulder comfort over weekend.  Tolerated gentle R shoulder/scapular strengthening well today focused on scapular stabilizers with pt. ending session nearly pain free.  applied ionto patch #2/6 to R shoulder in area of tenderness to end visit.      Rehab  Potential  Good    PT Treatment/Interventions  ADLs/Self Care Home Management;Cryotherapy;Electrical Stimulation;Moist Heat;Therapeutic exercise;Therapeutic activities;Neuromuscular re-education;Patient/family education;Manual techniques;Vasopneumatic Device;Taping;Dry needling;Passive range of motion;Ultrasound;Splinting;Energy conservation;Scar mobilization;Iontophoresis 61m/ml Dexamethasone    Consulted and Agree with Plan of Care  Patient       Patient will benefit from skilled therapeutic intervention in order to improve the following deficits and impairments:  Pain, Impaired UE functional use, Decreased strength, Decreased range of motion, Decreased activity tolerance, Decreased scar mobility, Postural dysfunction, Impaired flexibility  Visit Diagnosis: Acute pain of right shoulder  Stiffness of right shoulder, not elsewhere classified  Other symptoms and signs involving the musculoskeletal system  Muscle weakness (generalized)     Problem List Patient Active Problem List   Diagnosis Date Noted  . S/P arthroscopy of right shoulder 04/17/2018    MBess Harvest PTA 12/31/18 3:21 PM   [facility] [address] [place] [address]Phone: [phone]  Fax:  [phone] Name: GCHASITIE PASSEYMRN: [id number] of Birth: 5Mar 19, 1967

## 2019-01-03 ENCOUNTER — Other Ambulatory Visit: Payer: Self-pay

## 2019-01-03 ENCOUNTER — Encounter: Payer: Self-pay | Admitting: Physical Therapy

## 2019-01-03 ENCOUNTER — Ambulatory Visit: Payer: PRIVATE HEALTH INSURANCE | Admitting: Physical Therapy

## 2019-01-03 DIAGNOSIS — R29898 Other symptoms and signs involving the musculoskeletal system: Secondary | ICD-10-CM

## 2019-01-03 DIAGNOSIS — M25511 Pain in right shoulder: Secondary | ICD-10-CM

## 2019-01-03 DIAGNOSIS — M25611 Stiffness of right shoulder, not elsewhere classified: Secondary | ICD-10-CM

## 2019-01-03 DIAGNOSIS — M6281 Muscle weakness (generalized): Secondary | ICD-10-CM

## 2019-01-03 NOTE — Therapy (Signed)
[facility] [address]  [place], [place], [address] Phone: [phone]   Fax:  [phone]  Physical Therapy Progress Note  Patient Details  Name: Monica Petty MRN: [id number] Date of Birth: December 17, 1965 Referring Provider (PT): Sydnee Cabal, MD   Encounter Date: 01/03/2019  PT End of Session - 01/03/19 1502    Visit Number  60    Number of Visits  29    Date for PT Re-Evaluation  01/09/19    Authorization Type  WC    Authorization Time Period  addition of 2x/week for 6 weeks     Authorization - Visit Number  9    Authorization - Number of Visits  12    PT Start Time  8242    PT Stop Time  1419    PT Time Calculation (min)  66 min    Activity Tolerance  Patient tolerated treatment well;Patient limited by pain    Behavior During Therapy  [facility] for tasks assessed/performed       Past Medical History:  Diagnosis Date  . Anxiety   . Diverticulosis of colon   . GERD (gastroesophageal reflux disease)    takes okra pepsin supplement  . History of concussion    1987  fell of horse--- no residual  . Hypertension   . Migraine    occasional    Past Surgical History:  Procedure Laterality Date  . COLONOSCOPY  06/06/2016  . LAPAROSCOPIC CHOLECYSTECTOMY  06-03-2008   dr Ulyess Blossom  [facility]  . REDUCTION MAMMAPLASTY Bilateral 03-05-2002   dr barber  [facility]  . SHOULDER ARTHROSCOPY WITH ROTATOR CUFF REPAIR AND SUBACROMIAL DECOMPRESSION Right 04/17/2018   Procedure: Right shoulder evaluation under anesthesia, scope, debridement, subacromial decompression, rotator cuff repair;  Surgeon: Sydnee Cabal, MD;  Location: [facility];  Service: Orthopedics;  Laterality: Right;  120 mins General with Intra Scalene Block  . VAGINAL HYSTERECTOMY  02-12-2002   dr Nori Riis  [facility]  . WISDOM TOOTH EXTRACTION  teen    There were no vitals filed for this visit.  Subjective Assessment - 01/03/19 1314    Subjective  Reports  2nd patch of ionto didn't seem to help as much as the first but has still been taking it easy on her HEP. Has MRI on 04/24 and MD F/U 04/29.     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 Maximum retraction is 16.5 mm and the tear is 14 mm wide. Mild tendinopathy involving the infraspinatus tendon probable small intrasubstance tears. The subscapularis tendon is intact.    Patient Stated Goals  keep strengthening and mobility of R shoulder    Currently in Pain?  No/denies         [facility] PT Assessment - 01/03/19 0001      AROM   Right/Left Shoulder  Right    Right Shoulder Flexion  163 Degrees    Right Shoulder ABduction  152 Degrees    Right Shoulder Internal Rotation  --   FIR T7   Right Shoulder External Rotation  --   FER T3     Strength   Right/Left Shoulder  Right    Right Shoulder Flexion  4/5    Right Shoulder ABduction  4+/5    Right Shoulder Internal Rotation  4+/5    Right Shoulder External Rotation  4/5  [facility] Adult PT Treatment/Exercise - 01/03/19 0001      Shoulder Exercises: Standing   Horizontal ABduction  Both;Theraband;Strengthening;15 reps    Theraband Level (Shoulder Horizontal ABduction)  Level 2 (Red)    Horizontal ABduction Limitations  cues for palms down    Flexion  Strengthening;Right;10 reps;Weights    Shoulder Flexion Weight (lbs)  R shoulder flexion to overhead shelf with 1, 2, 3, 4, 5# 2 reps each weight   3/10 pain with 5#   ABduction  Strengthening;Right;10 reps;Theraband    Shoulder ABduction Weight (lbs)  2    ABduction Limitations  good ROM and no pain    Extension  Strengthening;Both;15 reps;Theraband    Theraband Level (Shoulder Extension)  Level 2 (Red)    Extension Limitations  good form    Row  Strengthening;Both;10 reps;Theraband    Theraband Level (Shoulder Row)  Level 2 (Red)    Row Limitations  row + ER     Diagonals   Strengthening;Right;10 reps;Theraband    Theraband Level (Shoulder Diagonals)  Level 1 (Yellow)    Diagonals Limitations  D2 flexion   2/10 discomfort in R shoulder   Other Standing Exercises  R shoulder circles with ball CW/CCW x15 each way      Shoulder Exercises: ROM/Strengthening   UBE (Upper Arm Bike)  Lvl 3.5, 3 min forwards/back      Electrical Stimulation   Electrical Stimulation Location  R posterior shoulder complex    Electrical Stimulation Action  IFC    Electrical Stimulation Parameters  output: 14 to tolerance, 15 min    Electrical Stimulation Goals  Pain      Manual Therapy   Manual Therapy  Soft tissue mobilization;Myofascial release;Passive ROM    Manual therapy comments  supine/L sidelying    Soft tissue mobilization  STM to R infra, teres group in area of tenderness   still with c/o TTP over usual area of pain   Passive ROM  R shoulder PROM all directions - mild pain at ~120 degrees of flexion which dissipated               PT Short Term Goals - 01/03/19 1340      PT SHORT TERM GOAL #1   Title  Patient to be independent with initial HEP.    Time  3    Period  Weeks    Status  Achieved        PT Long Term Goals - 01/03/19 1341      PT LONG TERM GOAL #1   Title  patient to be independent with advanced HEP    Time  6    Period  Weeks    Status  Partially Met   met for current     PT LONG TERM GOAL #2   Title  patient to demonstrate full and non-painful AROM (including multi-planar motions) without pain    Time  6    Period  Weeks    Status  Partially Met   01/03/19: demonstrating near-full AROM in all planes but with painful arc at ~110 deg of flexion     PT LONG TERM GOAL #3   Title  patient to improve R shoulder strength to >/= 4+/5 without pain    Time  6    Period  Weeks    Status  Partially Met   01/03/19: still lacking full flexion and ER strength     PT LONG TERM GOAL #4   Title  Patient to  demonstrate overhead reaching to  place 5# object on shelf overhead with <=1/10 pain.    Time  6    Period  Weeks    Status  Partially Met   01/03/19: able to lift 5# dumbell to shelf overhead with 3/10 pain and slight shoulder hiking compensations      PT LONG TERM GOAL #5   Title  Patient to report tolerance of 1 full shift at work without pain.    Time  6    Period  Weeks    Status  On-going   patient has not returned to work at this time     PT [place] #6   Title  Patient to report tolerance of fastening bra without pain.     Time  7    Period  Weeks    Status  Achieved   able to perform without pain           Plan - 01/03/19 1515    Clinical Impression Statement  Patient arrived to session with report of persistent but mild discomfort in R shoulder, with slight improvement from 2nd ionto patch applied last session. 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 Patient is now demonstrating near-full R shoulder AROM in all planes but with painful arc at ~110 deg of flexion. Still lacking full flexion and ER strength with MMT. Able to demonstrate R shoulder flexion to overhead shelf with 5lbs, however with c/o 3/10 pain and shoulder hiking. Patient continues to report discomfort with movements requiring reaching away from center of mass, worse with a longer lever arm. Also still with intermittent R shoulder popping with exercises today. Ended session with e-stim to R shoulder for pain relief. No complaints at end of session. Patient seems to be recovering from her recent R shoulder flare, however still having intermittent pain with overhead movements.     PT Treatment/Interventions  ADLs/Self Care Home Management;Cryotherapy;Electrical Stimulation;Moist Heat;Therapeutic exercise;Therapeutic activities;Neuromuscular re-education;Patient/family education;Manual techniques;Vasopneumatic Device;Taping;Dry  needling;Passive range of motion;Ultrasound;Splinting;Energy conservation;Scar mobilization;Iontophoresis 74m/ml Dexamethasone    PT Next Visit Plan  progress R shoulder strengthening to tolerance    Consulted and Agree with Plan of Care  Patient       Patient will benefit from skilled therapeutic intervention in order to improve the following deficits and impairments:  Pain, Impaired UE functional use, Decreased strength, Decreased range of motion, Decreased activity tolerance, Decreased scar mobility, Postural dysfunction, Impaired flexibility  Visit Diagnosis: Acute pain of right shoulder  Stiffness of right shoulder, not elsewhere classified  Other symptoms and signs involving the musculoskeletal system  Muscle weakness (generalized)     Problem List Patient Active Problem List   Diagnosis Date Noted  . S/P arthroscopy of right shoulder 04/17/2018     YJanene Harvey PT, DPT 01/03/19 3:20 PM   [facility] [address] [place] [address]Phone: [phone]  Fax:  [phone] Name: Monica LARUSSOMRN: [id number] of Birth: [birth year]

## 2019-01-07 ENCOUNTER — Ambulatory Visit: Payer: PRIVATE HEALTH INSURANCE | Admitting: Physical Therapy

## 2019-01-07 ENCOUNTER — Other Ambulatory Visit: Payer: Self-pay

## 2019-01-07 ENCOUNTER — Encounter: Payer: Self-pay | Admitting: Physical Therapy

## 2019-01-07 DIAGNOSIS — M6281 Muscle weakness (generalized): Secondary | ICD-10-CM

## 2019-01-07 DIAGNOSIS — R29898 Other symptoms and signs involving the musculoskeletal system: Secondary | ICD-10-CM

## 2019-01-07 DIAGNOSIS — M25511 Pain in right shoulder: Secondary | ICD-10-CM

## 2019-01-07 DIAGNOSIS — M25611 Stiffness of right shoulder, not elsewhere classified: Secondary | ICD-10-CM

## 2019-01-07 NOTE — Therapy (Signed)
[facility] [address]  [place], [place], [address] Phone: [phone]   Fax:  [phone]  Physical Therapy Treatment  Patient Details  Name: Monica Petty MRN: [id number] Date of Birth: Oct 03, 1965 Referring Provider (PT): Sydnee Cabal, MD   Encounter Date: 01/07/2019  PT End of Session - 01/07/19 1509    Visit Number  29    Number of Visits  44    Date for PT Re-Evaluation  01/09/19    Authorization Type  WC    Authorization Time Period  addition of 2x/week for 6 weeks     Authorization - Visit Number  10    Authorization - Number of Visits  12    PT Start Time  1316    PT Stop Time  1410    PT Time Calculation (min)  54 min    Activity Tolerance  Patient tolerated treatment well    Behavior During Therapy  [facility] for tasks assessed/performed       Past Medical History:  Diagnosis Date  . Anxiety   . Diverticulosis of colon   . GERD (gastroesophageal reflux disease)    takes okra pepsin supplement  . History of concussion    1987  fell of horse--- no residual  . Hypertension   . Migraine    occasional    Past Surgical History:  Procedure Laterality Date  . COLONOSCOPY  06/06/2016  . LAPAROSCOPIC CHOLECYSTECTOMY  06-03-2008   dr Ulyess Blossom  [facility]  . REDUCTION MAMMAPLASTY Bilateral 03-05-2002   dr barber  [facility]  . SHOULDER ARTHROSCOPY WITH ROTATOR CUFF REPAIR AND SUBACROMIAL DECOMPRESSION Right 04/17/2018   Procedure: Right shoulder evaluation under anesthesia, scope, debridement, subacromial decompression, rotator cuff repair;  Surgeon: Sydnee Cabal, MD;  Location: [facility];  Service: Orthopedics;  Laterality: Right;  120 mins General with Intra Scalene Block  . VAGINAL HYSTERECTOMY  02-12-2002   dr Nori Riis  [facility]  . WISDOM TOOTH EXTRACTION  teen    There were no vitals filed for this visit.  Subjective Assessment - 01/07/19 1318    Subjective  Reports that her shoulder if feeling  okay and still taking it easy with HEP.     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 Maximum retraction is 16.5 mm and the tear is 14 mm wide. Mild tendinopathy involving the infraspinatus tendon probable small intrasubstance tears. The subscapularis tendon is intact.    Patient Stated Goals  keep strengthening and mobility of R shoulder    Currently in Pain?  No/denies                       [facility] Adult PT Treatment/Exercise - 01/07/19 0001      Neck Exercises: Standing   Other Standing Exercises  B shoulder flexion with yellow TB ER resistance x10      Shoulder Exercises: Prone   Flexion  Strengthening;Right;10 reps    Flexion Limitations  Prone I over green pball   cues to decrease speed   External Rotation  Strengthening;Right;10 reps    External Rotation Limitations  R prone W over green pball   cues to avoid pushing into pain   Horizontal ABduction 1  Strengthening;Right;10 reps    Horizontal ABduction 1 Limitations  prone T over green pball x10    Other Prone Exercises  R scaption prone on green pball x10  Shoulder Exercises: Standing   Other Standing Exercises  R ER isometric pulses into ball 2x15    Other Standing Exercises  R shoulder circles with ball CW/CCW x10 each way      Shoulder Exercises: ROM/Strengthening   UBE (Upper Arm Bike)  Lvl 3.5, 3 min forwards/back    Cybex Row  10 reps    Cybex Row Limitations  1st set 25#, 2nd set 20#;  low handles    Other ROM/Strengthening Exercises  B straight arm pulldown 10x 10#   good tolerance     Electrical Stimulation   Electrical Stimulation Location  R posterior shoulder complex    Electrical Stimulation Action  IFC    Electrical Stimulation Parameters  output: 15 to tolerance    Electrical Stimulation Goals  Pain             PT Education - 01/07/19 1508    Education provided  Yes    Education Details  edu on use, wear  time, and precautions with e-stim home unit    Person(s) Educated  Patient    Methods  Explanation;Demonstration;Tactile cues;Verbal cues;Handout    Comprehension  Verbalized understanding;Returned demonstration       PT Short Term Goals - 01/03/19 1340      PT SHORT TERM GOAL #1   Title  Patient to be independent with initial HEP.    Time  3    Period  Weeks    Status  Achieved        PT Long Term Goals - 01/03/19 1341      PT LONG TERM GOAL #1   Title  patient to be independent with advanced HEP    Time  6    Period  Weeks    Status  Partially Met   met for current     PT LONG TERM GOAL #2   Title  patient to demonstrate full and non-painful AROM (including multi-planar motions) without pain    Time  6    Period  Weeks    Status  Partially Met   01/03/19: demonstrating near-full AROM in all planes but with painful arc at ~110 deg of flexion     PT LONG TERM GOAL #3   Title  patient to improve R shoulder strength to >/= 4+/5 without pain    Time  6    Period  Weeks    Status  Partially Met   01/03/19: still lacking full flexion and ER strength     PT LONG TERM GOAL #4   Title  Patient to demonstrate overhead reaching to place 5# object on shelf overhead with <=1/10 pain.    Time  6    Period  Weeks    Status  Partially Met   01/03/19: able to lift 5# dumbell to shelf overhead with 3/10 pain and slight shoulder hiking compensations      PT LONG TERM GOAL #5   Title  Patient to report tolerance of 1 full shift at work without pain.    Time  6    Period  Weeks    Status  On-going   patient has not returned to work at this time     PT [place] #6   Title  Patient to report tolerance of fastening bra without pain.     Time  7    Period  Weeks    Status  Achieved   able to perform without pain  Plan - 01/07/19 1515    Clinical Impression Statement  Patient arrived to session with no new complaints. Worked on Patent examiner to  patient's tolerance with good form demonstrated by patient. Able to tolerate addition of straight arm pulldowns with light weight today with no complaints- patient typically with R posterior shoulder pain with exercises requiring long lever arm. Also reporting decreased popping in R shoulder with ther-ex today. Worked on periscapular strengthening without weight but with cues to slow down movement in order to increase challenge. Educated patient on use, wear time, and precautions with TENS home unit as patient reporting benefit from e-stim. Believe patient would benefit from TENS home unit for management of pain d/t chronicity and irritability of R shoulder pain. Patient also continues to demonstrate R shoulder weakness in flexors and external rotators. Ended session with e-stim R shoulder- patient without complaints at end of session.    PT Treatment/Interventions  ADLs/Self Care Home Management;Cryotherapy;Electrical Stimulation;Moist Heat;Therapeutic exercise;Therapeutic activities;Neuromuscular re-education;Patient/family education;Manual techniques;Vasopneumatic Device;Taping;Dry needling;Passive range of motion;Ultrasound;Splinting;Energy conservation;Scar mobilization;Iontophoresis 56m/ml Dexamethasone    PT Next Visit Plan  30 day hold    Consulted and Agree with Plan of Care  Patient       Patient will benefit from skilled therapeutic intervention in order to improve the following deficits and impairments:  Pain, Impaired UE functional use, Decreased strength, Decreased range of motion, Decreased activity tolerance, Decreased scar mobility, Postural dysfunction, Impaired flexibility  Visit Diagnosis: Acute pain of right shoulder  Stiffness of right shoulder, not elsewhere classified  Other symptoms and signs involving the musculoskeletal system  Muscle weakness (generalized)     Problem List Patient Active Problem List   Diagnosis Date Noted  . S/P arthroscopy of right shoulder  04/17/2018     YJanene Harvey PT, DPT 01/07/19 3:21 PM   [facility] [address] [place] [address]Phone: [phone]  Fax:  [phone] Name: Monica KREHERMRN: [id number] of Birth: 524-May-1967

## 2019-01-07 NOTE — Patient Instructions (Signed)
TENS UNIT  This is helpful for muscle pain and spasm.   Search and Purchase a TENS 7000 2nd edition at www.tenspros.com or www.amazon.com  (It should be less than $30)     TENS unit instructions:   Do not shower or bathe with the unit on  Turn the unit off before removing electrodes or batteries  If the electrodes lose stickiness add a drop of water to the electrodes after they are disconnected from the unit and place on plastic sheet. If you continued to have difficulty, call the TENS unit company to purchase more electrodes.  Do not apply lotion on the skin area prior to use. Make sure the skin is clean and dry as this will help prolong the life of the electrodes.  After use, always check skin for unusual red areas, rash or other skin difficulties. If there are any skin problems, does not apply electrodes to the same area.  Never remove the electrodes from the unit by pulling the wires.  Do not use the TENS unit or electrodes other than as directed.  Do not change electrode placement without consulting your therapist or physician.  Keep 2 fingers with between each electrode.   TENS stands for Transcutaneous Electrical Nerve Stimulation. In other words, electrical impulses are allowed to pass through the skin in order to excite a nerve.   Purpose and Use of TENS:  TENS is a method used to manage acute and chronic pain without the use of drugs. It has been effective in managing pain associated with surgery, sprains, strains, trauma, rheumatoid arthritis, and neuralgias. It is a non-addictive, low risk, and non-invasive technique used to control pain. It is not, by any means, a curative form of treatment.   How TENS Works:  Most TENS units are a Paramedic unit powered by one 9 volt battery. Attached to the outside of the unit are two lead wires where two pins and/or snaps connect on each wire. All units come with a set of four reusable pads or electrodes. These are placed  on the skin surrounding the area involved. By inserting the leads into  the pads, the electricity can pass from the unit making the circuit complete.  As the intensity is turned up slowly, the electrical current enters the body from the electrodes through the skin to the surrounding nerve fibers. This triggers the release of hormones from within the body. These hormones contain pain relievers. By increasing the circulation of these hormones, the person's pain may be lessened. It is also believed that the electrical stimulation itself helps to block the pain messages being sent to the brain, thus also decreasing the body's perception of pain.   Hazards:  TENS units are NOT to be used by patients with PACEMAKERS, DEFIBRILLATORS, DIABETIC PUMPS, PREGNANT WOMEN, and patients with SEIZURE DISORDERS.  TENS units are NOT to be used over the heart, throat, brain, or spinal cord.  One of the major side effects from the TENS unit may be skin irritation. Some people may develop a rash if they are sensitive to the materials used in the electrodes or the connecting wires.   Wear the unit for 15 minutes.   Avoid overuse due the body getting used to the stem making it not as effective over time.

## 2019-01-09 ENCOUNTER — Other Ambulatory Visit: Payer: Self-pay

## 2019-01-09 ENCOUNTER — Ambulatory Visit: Payer: PRIVATE HEALTH INSURANCE

## 2019-01-09 DIAGNOSIS — M6281 Muscle weakness (generalized): Secondary | ICD-10-CM

## 2019-01-09 DIAGNOSIS — M25611 Stiffness of right shoulder, not elsewhere classified: Secondary | ICD-10-CM

## 2019-01-09 DIAGNOSIS — M25511 Pain in right shoulder: Secondary | ICD-10-CM

## 2019-01-09 DIAGNOSIS — R29898 Other symptoms and signs involving the musculoskeletal system: Secondary | ICD-10-CM

## 2019-01-09 NOTE — Therapy (Addendum)
[facility] [address]  [place], [place], [address] Phone: [phone]   Fax:  [phone]  Physical Therapy Treatment  Patient Details  Name: Monica Petty MRN: [id number] Date of Birth: 1965/12/02 Referring Provider (PT): Sydnee Cabal, MD   Encounter Date: 01/09/2019  PT End of Session - 01/09/19 1321    Visit Number  18    Number of Visits  11    Date for PT Re-Evaluation  01/09/19    Authorization Type  WC    Authorization Time Period  addition of 2x/week for 6 weeks     Authorization - Visit Number  11    Authorization - Number of Visits  12    PT Start Time  1316    PT Stop Time  1410    PT Time Calculation (min)  54 min    Activity Tolerance  Patient tolerated treatment well    Behavior During Therapy  [facility] for tasks assessed/performed       Past Medical History:  Diagnosis Date  . Anxiety   . Diverticulosis of colon   . GERD (gastroesophageal reflux disease)    takes okra pepsin supplement  . History of concussion    1987  fell of horse--- no residual  . Hypertension   . Migraine    occasional    Past Surgical History:  Procedure Laterality Date  . COLONOSCOPY  06/06/2016  . LAPAROSCOPIC CHOLECYSTECTOMY  06-03-2008   dr Ulyess Blossom  [facility]  . REDUCTION MAMMAPLASTY Bilateral 03-05-2002   dr barber  [facility]  . SHOULDER ARTHROSCOPY WITH ROTATOR CUFF REPAIR AND SUBACROMIAL DECOMPRESSION Right 04/17/2018   Procedure: Right shoulder evaluation under anesthesia, scope, debridement, subacromial decompression, rotator cuff repair;  Surgeon: Sydnee Cabal, MD;  Location: [facility];  Service: Orthopedics;  Laterality: Right;  120 mins General with Intra Scalene Block  . VAGINAL HYSTERECTOMY  02-12-2002   dr Nori Riis  [facility]  . WISDOM TOOTH EXTRACTION  teen    There were no vitals filed for this visit.  Subjective Assessment - 01/09/19 1443    Subjective  Pt. noting she feels comfortable  transitioning to home program following today.      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 Maximum retraction is 16.5 mm and the tear is 14 mm wide. Mild tendinopathy involving the infraspinatus tendon probable small intrasubstance tears. The subscapularis tendon is intact.    Patient Stated Goals  keep strengthening and mobility of R shoulder    Currently in Pain?  No/denies    Pain Score  0-No pain    Multiple Pain Sites  No                       [facility] Adult PT Treatment/Exercise - 01/09/19 1326      Self-Care   Self-Care  Other Self-Care Comments    Other Self-Care Comments   review of comprehensive HEP to check for need for update and pt. understanding      Shoulder Exercises: Supine   Protraction  Right;20 reps    Protraction Weight (lbs)  6    Flexion  Right;15 reps;Theraband    Theraband Level (Shoulder Flexion)  Level 2 (Red)    Other Supine Exercises  R shoulder ABC 2# x 1 round       Shoulder Exercises: Standing   External Rotation  Theraband;Strengthening;15 reps;Right  Theraband Level (Shoulder External Rotation)  Level 2 (Red)    Internal Rotation  15 reps;Right;Strengthening;Theraband    Theraband Level (Shoulder Internal Rotation)  Level 2 (Red)      Shoulder Exercises: ROM/Strengthening   UBE (Upper Arm Bike)  Lvl 3.5, 3 min forwards/back    Cybex Row  10 reps   3" hold    Cybex Row Limitations  R single arm 5# cable row    Wall Pushups  10 reps    Wall Pushups Limitations  increased LE distance from wall     Other ROM/Strengthening Exercises  Serratus BATCA punch 20# x 15 reps      Shoulder Exercises: Stretch   Internal Rotation Stretch  30 seconds   x 2 reps    Internal Rotation Stretch Limitations  sleeper stretch       Shoulder Exercises: Body Blade   External Rotation  15 seconds;1 rep    External Rotation Limitations  neutral elbow positioning - mild pain at end  of session       Electrical Stimulation   Electrical Stimulation Location  R posterior shoulder complex    Electrical Stimulation Action  Modulated - TENS 7000 2nd edition home TENS unit to allow pt. to demo unit     Electrical Stimulation Parameters  to tolerance, 10'    Electrical Stimulation Goals  Pain               PT Short Term Goals - 01/03/19 1340      PT SHORT TERM GOAL #1   Title  Patient to be independent with initial HEP.    Time  3    Period  Weeks    Status  Achieved        PT Long Term Goals - 01/03/19 1341      PT LONG TERM GOAL #1   Title  patient to be independent with advanced HEP    Time  6    Period  Weeks    Status  Partially Met   met for current     PT LONG TERM GOAL #2   Title  patient to demonstrate full and non-painful AROM (including multi-planar motions) without pain    Time  6    Period  Weeks    Status  Partially Met   01/03/19: demonstrating near-full AROM in all planes but with painful arc at ~110 deg of flexion     PT LONG TERM GOAL #3   Title  patient to improve R shoulder strength to >/= 4+/5 without pain    Time  6    Period  Weeks    Status  Partially Met   01/03/19: still lacking full flexion and ER strength     PT LONG TERM GOAL #4   Title  Patient to demonstrate overhead reaching to place 5# object on shelf overhead with <=1/10 pain.    Time  6    Period  Weeks    Status  Partially Met   01/03/19: able to lift 5# dumbell to shelf overhead with 3/10 pain and slight shoulder hiking compensations      PT LONG TERM GOAL #5   Title  Patient to report tolerance of 1 full shift at work without pain.    Time  6    Period  Weeks    Status  On-going   patient has not returned to work at this time     PT Spivey #6  Title  Patient to report tolerance of fastening bra without pain.     Time  7    Period  Weeks    Status  Achieved   able to perform without pain           Plan - 01/09/19 1322     Clinical Impression Statement  Pt. verbalizing she feels comfortable to go to 30-day hold pending upcoming MRI and upcoming MD f/u.  Session focused on HEP review to prep for transition to 30-day hold on therapy.  Pt. verbalized understanding of ongoing HEP and reports daily adherence to full HEP with good overall tolerance.  Ended visit with E-stim to R shoulder complex for pain relief as pt. complaining of mild R shoulder discomfort.  Pt. now on 30-day hold.      Rehab Potential  Good    PT Treatment/Interventions  ADLs/Self Care Home Management;Cryotherapy;Electrical Stimulation;Moist Heat;Therapeutic exercise;Therapeutic activities;Neuromuscular re-education;Patient/family education;Manual techniques;Vasopneumatic Device;Taping;Dry needling;Passive range of motion;Ultrasound;Splinting;Energy conservation;Scar mobilization;Iontophoresis 29m/ml Dexamethasone    PT Next Visit Plan  30 day hold    Consulted and Agree with Plan of Care  Patient       Patient will benefit from skilled therapeutic intervention in order to improve the following deficits and impairments:  Pain, Impaired UE functional use, Decreased strength, Decreased range of motion, Decreased activity tolerance, Decreased scar mobility, Postural dysfunction, Impaired flexibility  Visit Diagnosis: Acute pain of right shoulder  Stiffness of right shoulder, not elsewhere classified  Other symptoms and signs involving the musculoskeletal system  Muscle weakness (generalized)     Problem List Patient Active Problem List   Diagnosis Date Noted  . S/P arthroscopy of right shoulder 04/17/2018    MBess Harvest PTA 01/09/19 2:44 PM   [facility] [address] [place] [address]Phone: [phone]  Fax:  [phone] Name: GMARDELLE PANDOLFIMRN: [id number] of Birth: 508-15-1967 PHYSICAL THERAPY DISCHARGE SUMMARY  Visits from Start of Care:  645 Current functional level related to goals / functional outcomes: See above clinical impression; patient did not return after 30 day hold   Remaining deficits: Decreased strength, ROM, pain with reaching overhead   Education / Equipment: HEP  Plan: Patient agrees to discharge.  Patient goals were partially met. Patient is being discharged due to not returning since the last visit.  ?????     YJanene Harvey PT, DPT 02/20/19 1:56 PM

## 2019-01-11 ENCOUNTER — Ambulatory Visit (HOSPITAL_COMMUNITY)
Admission: RE | Admit: 2019-01-11 | Discharge: 2019-01-11 | Disposition: A | Discharge status: Home / Self Care | Payer: PRIVATE HEALTH INSURANCE | Source: Ambulatory Visit | Attending: Ophthalmology | Admitting: Ophthalmology

## 2019-01-11 ENCOUNTER — Other Ambulatory Visit: Payer: Self-pay

## 2019-01-11 DIAGNOSIS — M25511 Pain in right shoulder: Secondary | ICD-10-CM | POA: Insufficient documentation

## 2019-01-11 MED ORDER — IOPAMIDOL (ISOVUE-M 200) INJECTION 41%
10.0000 mL | Freq: Once | INTRAMUSCULAR | Status: AC
  Administered 2019-01-11: 10 mL via INTRA_ARTICULAR

## 2019-01-11 MED ORDER — GADOBENATE DIMEGLUMINE 529 MG/ML IV SOLN
0.1000 mL | Freq: Once | INTRAVENOUS | Status: AC | PRN
  Administered 2019-01-11: 11:00:00 0.1 mL via INTRA_ARTICULAR

## 2019-01-11 MED ORDER — LIDOCAINE HCL (PF) 1 % IJ SOLN
5.0000 mL | Freq: Once | INTRAMUSCULAR | Status: AC
  Administered 2019-01-11: 5 mL via INTRADERMAL

## 2019-01-14 DIAGNOSIS — I10 Essential (primary) hypertension: Secondary | ICD-10-CM | POA: Diagnosis not present

## 2019-01-14 DIAGNOSIS — Z0000 Encounter for general adult medical examination without abnormal findings: Secondary | ICD-10-CM | POA: Diagnosis not present

## 2019-01-14 DIAGNOSIS — Z6831 Body mass index (BMI) 31.0-31.9, adult: Secondary | ICD-10-CM | POA: Diagnosis not present

## 2019-01-16 MED FILL — METHOCARBAMOL 500 MG TABLET: 500 | 17 days supply | Qty: 50 | Fill #1

## 2019-01-18 DIAGNOSIS — Z0000 Encounter for general adult medical examination without abnormal findings: Secondary | ICD-10-CM | POA: Diagnosis not present

## 2019-02-12 MED FILL — PREMARIN 1.25 MG TABLET: 1.25 | 90 days supply | Qty: 90 | Fill #0

## 2019-02-12 MED FILL — HYDROCHLOROTHIAZIDE 25 MG T: 25 | 90 days supply | Qty: 90 | Fill #0

## 2019-02-13 DIAGNOSIS — Z6832 Body mass index (BMI) 32.0-32.9, adult: Secondary | ICD-10-CM | POA: Diagnosis not present

## 2019-02-13 DIAGNOSIS — Z01419 Encounter for gynecological examination (general) (routine) without abnormal findings: Secondary | ICD-10-CM | POA: Diagnosis not present

## 2019-02-13 DIAGNOSIS — Z1231 Encounter for screening mammogram for malignant neoplasm of breast: Secondary | ICD-10-CM | POA: Diagnosis not present

## 2019-02-13 MED FILL — ALPRAZolam 0.5 MG TABS: 0.5 | 30 days supply | Qty: 90 | Fill #0

## 2019-02-14 MED FILL — ESTRADIOL 0.1 MG/GM CREA: 0.1 | 90 days supply | Qty: 43 | Fill #0

## 2019-02-19 DIAGNOSIS — L910 Hypertrophic scar: Secondary | ICD-10-CM | POA: Diagnosis not present

## 2019-02-19 DIAGNOSIS — D2330 Other benign neoplasm of skin of unspecified part of face: Secondary | ICD-10-CM | POA: Diagnosis not present

## 2019-02-19 DIAGNOSIS — D485 Neoplasm of uncertain behavior of skin: Secondary | ICD-10-CM | POA: Diagnosis not present

## 2019-02-22 MED FILL — TRETINOIN 0.025% CREAM: 0.025 | 30 days supply | Qty: 90 | Fill #0

## 2019-03-05 MED FILL — LISINOPRIL 5 MG TABLET: 5 | 90 days supply | Qty: 90 | Fill #0

## 2019-03-20 MED FILL — ALPRAZolam 0.5 MG TABS: 0.5 | 30 days supply | Qty: 90 | Fill #1

## 2019-05-14 MED FILL — HYDROCHLOROTHIAZIDE 25 MG T: 25 | 90 days supply | Qty: 90 | Fill #1

## 2019-05-20 ENCOUNTER — Inpatient Hospital Stay (HOSPITAL_COMMUNITY): Admission: RE | Admit: 2019-05-20 | Payer: Self-pay | Source: Ambulatory Visit

## 2019-05-21 ENCOUNTER — Encounter (HOSPITAL_BASED_OUTPATIENT_CLINIC_OR_DEPARTMENT_OTHER): Payer: Self-pay

## 2019-05-21 ENCOUNTER — Other Ambulatory Visit (HOSPITAL_COMMUNITY)
Admission: RE | Admit: 2019-05-21 | Discharge: 2019-05-21 | Disposition: A | Discharge status: Home / Self Care | Payer: PRIVATE HEALTH INSURANCE | Source: Ambulatory Visit | Attending: Specialist | Admitting: Specialist

## 2019-05-21 DIAGNOSIS — Z01812 Encounter for preprocedural laboratory examination: Secondary | ICD-10-CM | POA: Insufficient documentation

## 2019-05-21 DIAGNOSIS — Z20828 Contact with and (suspected) exposure to other viral communicable diseases: Secondary | ICD-10-CM | POA: Insufficient documentation

## 2019-05-21 LAB — SARS CORONAVIRUS 2 (TAT 6-24 HRS): SARS Coronavirus 2: NEGATIVE

## 2019-05-22 ENCOUNTER — Encounter (HOSPITAL_BASED_OUTPATIENT_CLINIC_OR_DEPARTMENT_OTHER): Payer: Self-pay | Admitting: *Deleted

## 2019-05-22 ENCOUNTER — Other Ambulatory Visit: Payer: Self-pay

## 2019-05-22 NOTE — Progress Notes (Signed)
Spoke w/ pt via phone for pre-op interview.  Npo after mn w/ exception clear liquids until 1000 then nothing by mouth, pt verbalized understanding.  Arrive at 1100.  Needs istat 8 and ekg.  Will take tagamet and premarin am dos w/ sips of water.

## 2019-05-23 ENCOUNTER — Ambulatory Visit (HOSPITAL_BASED_OUTPATIENT_CLINIC_OR_DEPARTMENT_OTHER)
Admission: RE | Admit: 2019-05-23 | Discharge: 2019-05-23 | Disposition: A | Discharge status: Home / Self Care | Payer: PRIVATE HEALTH INSURANCE | Attending: Specialist | Admitting: Specialist

## 2019-05-23 ENCOUNTER — Encounter (HOSPITAL_BASED_OUTPATIENT_CLINIC_OR_DEPARTMENT_OTHER)
Admission: RE | Disposition: A | Discharge status: Home / Self Care | Payer: Self-pay | Source: Home / Self Care | Attending: Specialist

## 2019-05-23 ENCOUNTER — Other Ambulatory Visit: Payer: Self-pay

## 2019-05-23 ENCOUNTER — Encounter (HOSPITAL_BASED_OUTPATIENT_CLINIC_OR_DEPARTMENT_OTHER): Payer: Self-pay | Admitting: *Deleted

## 2019-05-23 ENCOUNTER — Ambulatory Visit (HOSPITAL_BASED_OUTPATIENT_CLINIC_OR_DEPARTMENT_OTHER): Payer: PRIVATE HEALTH INSURANCE | Admitting: Certified Registered"

## 2019-05-23 DIAGNOSIS — M75121 Complete rotator cuff tear or rupture of right shoulder, not specified as traumatic: Secondary | ICD-10-CM | POA: Diagnosis present

## 2019-05-23 DIAGNOSIS — K5730 Diverticulosis of large intestine without perforation or abscess without bleeding: Secondary | ICD-10-CM | POA: Diagnosis not present

## 2019-05-23 DIAGNOSIS — Z90710 Acquired absence of both cervix and uterus: Secondary | ICD-10-CM | POA: Insufficient documentation

## 2019-05-23 DIAGNOSIS — Z880 Allergy status to penicillin: Secondary | ICD-10-CM | POA: Insufficient documentation

## 2019-05-23 DIAGNOSIS — Z885 Allergy status to narcotic agent status: Secondary | ICD-10-CM | POA: Insufficient documentation

## 2019-05-23 DIAGNOSIS — Z8371 Family history of colonic polyps: Secondary | ICD-10-CM | POA: Insufficient documentation

## 2019-05-23 DIAGNOSIS — Z9049 Acquired absence of other specified parts of digestive tract: Secondary | ICD-10-CM | POA: Insufficient documentation

## 2019-05-23 DIAGNOSIS — Z86010 Personal history of colonic polyps: Secondary | ICD-10-CM | POA: Diagnosis not present

## 2019-05-23 DIAGNOSIS — Z791 Long term (current) use of non-steroidal anti-inflammatories (NSAID): Secondary | ICD-10-CM | POA: Insufficient documentation

## 2019-05-23 DIAGNOSIS — Z882 Allergy status to sulfonamides status: Secondary | ICD-10-CM | POA: Insufficient documentation

## 2019-05-23 DIAGNOSIS — X58XXXA Exposure to other specified factors, initial encounter: Secondary | ICD-10-CM | POA: Diagnosis not present

## 2019-05-23 DIAGNOSIS — Z803 Family history of malignant neoplasm of breast: Secondary | ICD-10-CM | POA: Diagnosis not present

## 2019-05-23 DIAGNOSIS — K219 Gastro-esophageal reflux disease without esophagitis: Secondary | ICD-10-CM | POA: Insufficient documentation

## 2019-05-23 DIAGNOSIS — G43909 Migraine, unspecified, not intractable, without status migrainosus: Secondary | ICD-10-CM | POA: Insufficient documentation

## 2019-05-23 DIAGNOSIS — S46011A Strain of muscle(s) and tendon(s) of the rotator cuff of right shoulder, initial encounter: Secondary | ICD-10-CM | POA: Insufficient documentation

## 2019-05-23 DIAGNOSIS — Z79899 Other long term (current) drug therapy: Secondary | ICD-10-CM | POA: Insufficient documentation

## 2019-05-23 DIAGNOSIS — F419 Anxiety disorder, unspecified: Secondary | ICD-10-CM | POA: Insufficient documentation

## 2019-05-23 DIAGNOSIS — I10 Essential (primary) hypertension: Secondary | ICD-10-CM | POA: Insufficient documentation

## 2019-05-23 HISTORY — DX: Unspecified rotator cuff tear or rupture of unspecified shoulder, not specified as traumatic: M75.100

## 2019-05-23 HISTORY — PX: SHOULDER ARTHROSCOPY WITH ROTATOR CUFF REPAIR: SHX5685

## 2019-05-23 HISTORY — DX: Personal history of colonic polyps: Z86.010

## 2019-05-23 HISTORY — DX: Personal history of colon polyps, unspecified: Z86.0100

## 2019-05-23 LAB — POCT I-STAT, CHEM 8
BUN: 13 mg/dL (ref 6–20)
Calcium, Ion: 1.25 mmol/L (ref 1.15–1.40)
Chloride: 103 mmol/L (ref 98–111)
Creatinine, Ser: 0.7 mg/dL (ref 0.44–1.00)
Glucose, Bld: 110 mg/dL — ABNORMAL HIGH (ref 70–99)
HCT: 46 % (ref 36.0–46.0)
Hemoglobin: 15.6 g/dL — ABNORMAL HIGH (ref 12.0–15.0)
Potassium: 3.6 mmol/L (ref 3.5–5.1)
Sodium: 141 mmol/L (ref 135–145)
TCO2: 28 mmol/L (ref 22–32)

## 2019-05-23 SURGERY — ARTHROSCOPY, SHOULDER, WITH ROTATOR CUFF REPAIR
Anesthesia: General | Site: Shoulder | Laterality: Right

## 2019-05-23 MED ORDER — FENTANYL CITRATE (PF) 100 MCG/2ML IJ SOLN
25.0000 ug | INTRAMUSCULAR | Status: DC | PRN
  Filled 2019-05-23: qty 1

## 2019-05-23 MED ORDER — CEFAZOLIN SODIUM-DEXTROSE 2-4 GM/100ML-% IV SOLN
2.0000 g | INTRAVENOUS | Status: AC
  Administered 2019-05-23: 2 g via INTRAVENOUS
  Filled 2019-05-23: qty 100

## 2019-05-23 MED ORDER — ROCURONIUM BROMIDE 10 MG/ML (PF) SYRINGE
PREFILLED_SYRINGE | INTRAVENOUS | Status: AC
  Filled 2019-05-23: qty 10

## 2019-05-23 MED ORDER — ONDANSETRON HCL 4 MG PO TABS: 4.0000 mg | ORAL_TABLET | Freq: Three times a day (TID) | ORAL | 1 refills | Status: AC | PRN

## 2019-05-23 MED ORDER — PROPOFOL 10 MG/ML IV BOLUS
INTRAVENOUS | Status: DC | PRN
  Administered 2019-05-23: 160 mg via INTRAVENOUS

## 2019-05-23 MED ORDER — FENTANYL CITRATE (PF) 100 MCG/2ML IJ SOLN
INTRAMUSCULAR | Status: AC
  Filled 2019-05-23: qty 2

## 2019-05-23 MED ORDER — CEPHALEXIN 500 MG PO CAPS: 500.0000 mg | ORAL_CAPSULE | Freq: Four times a day (QID) | ORAL | 0 refills | Status: AC

## 2019-05-23 MED ORDER — BUPIVACAINE LIPOSOME 1.3 % IJ SUSP
INTRAMUSCULAR | Status: DC | PRN
  Administered 2019-05-23: 10 mL via PERINEURAL

## 2019-05-23 MED ORDER — SODIUM CHLORIDE 0.9 % IR SOLN
Status: DC | PRN
  Administered 2019-05-23 (×2): 3000 mL

## 2019-05-23 MED ORDER — MIDAZOLAM HCL 2 MG/2ML IJ SOLN
INTRAMUSCULAR | Status: AC
  Filled 2019-05-23: qty 2

## 2019-05-23 MED ORDER — CHLORHEXIDINE GLUCONATE 4 % EX LIQD
60.0000 mL | Freq: Once | CUTANEOUS | Status: DC
  Filled 2019-05-23: qty 118

## 2019-05-23 MED ORDER — DEXAMETHASONE SODIUM PHOSPHATE 10 MG/ML IJ SOLN
INTRAMUSCULAR | Status: DC | PRN
  Administered 2019-05-23: 10 mg via INTRAVENOUS

## 2019-05-23 MED ORDER — ONDANSETRON HCL 4 MG/2ML IJ SOLN
4.0000 mg | Freq: Once | INTRAMUSCULAR | Status: DC | PRN
  Filled 2019-05-23: qty 2

## 2019-05-23 MED ORDER — ONDANSETRON HCL 4 MG/2ML IJ SOLN
INTRAMUSCULAR | Status: DC | PRN
  Administered 2019-05-23: 4 mg via INTRAVENOUS

## 2019-05-23 MED ORDER — ROCURONIUM BROMIDE 10 MG/ML (PF) SYRINGE
PREFILLED_SYRINGE | INTRAVENOUS | Status: DC | PRN
  Administered 2019-05-23: 50 mg via INTRAVENOUS
  Administered 2019-05-23: 10 mg via INTRAVENOUS

## 2019-05-23 MED ORDER — SODIUM CHLORIDE (PF) 0.9 % IJ SOLN
INTRAMUSCULAR | Status: DC | PRN
  Administered 2019-05-23: 15 mL

## 2019-05-23 MED ORDER — FENTANYL CITRATE (PF) 100 MCG/2ML IJ SOLN
50.0000 ug | INTRAMUSCULAR | Status: DC | PRN
  Administered 2019-05-23 (×2): 50 ug via INTRAVENOUS
  Filled 2019-05-23: qty 1

## 2019-05-23 MED ORDER — ACETAMINOPHEN 500 MG PO TABS
ORAL_TABLET | ORAL | Status: AC
  Filled 2019-05-23: qty 2

## 2019-05-23 MED ORDER — ONDANSETRON HCL 4 MG/2ML IJ SOLN
INTRAMUSCULAR | Status: AC
  Filled 2019-05-23: qty 2

## 2019-05-23 MED ORDER — DEXAMETHASONE SODIUM PHOSPHATE 10 MG/ML IJ SOLN
INTRAMUSCULAR | Status: AC
  Filled 2019-05-23: qty 1

## 2019-05-23 MED ORDER — LIDOCAINE 2% (20 MG/ML) 5 ML SYRINGE
INTRAMUSCULAR | Status: AC
  Filled 2019-05-23: qty 5

## 2019-05-23 MED ORDER — SUGAMMADEX SODIUM 500 MG/5ML IV SOLN
INTRAVENOUS | Status: DC | PRN
  Administered 2019-05-23: 200 mg via INTRAVENOUS

## 2019-05-23 MED ORDER — OXYCODONE HCL 5 MG PO TABS: 5.0000 mg | ORAL_TABLET | Freq: Three times a day (TID) | ORAL | 0 refills | Status: AC | PRN

## 2019-05-23 MED ORDER — CEFAZOLIN SODIUM-DEXTROSE 2-4 GM/100ML-% IV SOLN
INTRAVENOUS | Status: AC
  Filled 2019-05-23: qty 100

## 2019-05-23 MED ORDER — ACETAMINOPHEN 500 MG PO TABS
1000.0000 mg | ORAL_TABLET | Freq: Once | ORAL | Status: AC
  Administered 2019-05-23: 12:00:00 1000 mg via ORAL
  Filled 2019-05-23: qty 2

## 2019-05-23 MED ORDER — LIDOCAINE 2% (20 MG/ML) 5 ML SYRINGE
INTRAMUSCULAR | Status: DC | PRN
  Administered 2019-05-23: 100 mg via INTRAVENOUS

## 2019-05-23 MED ORDER — BUPIVACAINE-EPINEPHRINE (PF) 0.5% -1:200000 IJ SOLN
INTRAMUSCULAR | Status: DC | PRN
  Administered 2019-05-23: 10 mL via PERINEURAL

## 2019-05-23 MED ORDER — FENTANYL CITRATE (PF) 100 MCG/2ML IJ SOLN
INTRAMUSCULAR | Status: DC | PRN
  Administered 2019-05-23 (×2): 50 ug via INTRAVENOUS

## 2019-05-23 MED ORDER — MIDAZOLAM HCL 5 MG/5ML IJ SOLN
INTRAMUSCULAR | Status: DC | PRN
  Administered 2019-05-23: 2 mg via INTRAVENOUS

## 2019-05-23 MED ORDER — MIDAZOLAM HCL 2 MG/2ML IJ SOLN
1.0000 mg | INTRAMUSCULAR | Status: DC | PRN
  Administered 2019-05-23: 2 mg via INTRAVENOUS
  Filled 2019-05-23: qty 2

## 2019-05-23 MED ORDER — LACTATED RINGERS IV SOLN
INTRAVENOUS | Status: DC
  Administered 2019-05-23 (×2): via INTRAVENOUS
  Filled 2019-05-23: qty 1000

## 2019-05-23 MED FILL — oxyCODONE HCL 5 MG TABS: 5 | 13 days supply | Qty: 40 | Fill #0

## 2019-05-23 MED FILL — CEPHALEXIN 500 MG CAPSULE: 500 | 3 days supply | Qty: 12 | Fill #0

## 2019-05-23 MED FILL — ONDANSETRON HCL 4 MG TABLET: 4 | 17 days supply | Qty: 50 | Fill #0

## 2019-05-23 SURGICAL SUPPLY — 83 items
ANCH SUT SWLK 19.1X4.75 VT (Anchor) ×2 IMPLANT
ANCHOR PEEK 4.75X19.1 SWLK C (Anchor) ×4 IMPLANT
BLADE EXCALIBUR 4.0MM X 13CM (MISCELLANEOUS) ×1
BLADE EXCALIBUR 4.0X13 (MISCELLANEOUS) ×2 IMPLANT
BLADE EXCALIBUR 5.0MM X 13CM (MISCELLANEOUS) ×1
BLADE EXCALIBUR 5.0X13 (MISCELLANEOUS) ×2 IMPLANT
BLADE SURG 15 STRL LF DISP TIS (BLADE) IMPLANT
BLADE SURG 15 STRL SS (BLADE) ×3
BNDG COHESIVE 4X5 TAN STRL (GAUZE/BANDAGES/DRESSINGS) ×2 IMPLANT
BURR OVAL 8 FLU 5.0MM X 13CM (MISCELLANEOUS) ×1
BURR OVAL 8 FLU 5.0X13 (MISCELLANEOUS) ×2 IMPLANT
CANNULA 5.75X7 CRYSTAL CLEAR (CANNULA) ×3 IMPLANT
CANNULA 5.75X71 LONG (CANNULA) IMPLANT
CANNULA TWIST IN 8.25X7CM (CANNULA) ×2 IMPLANT
CONNECTOR 5 IN 1 STRAIGHT STRL (MISCELLANEOUS) ×3 IMPLANT
COVER WAND RF STERILE (DRAPES) ×3 IMPLANT
DECANTER SPIKE VIAL GLASS SM (MISCELLANEOUS) ×3 IMPLANT
DRAPE ORTHO SPLIT 77X108 STRL (DRAPES) ×6
DRAPE SHEET LG 3/4 BI-LAMINATE (DRAPES) ×3 IMPLANT
DRAPE STERI 35X30 U-POUCH (DRAPES) ×3 IMPLANT
DRAPE SURG 17X23 STRL (DRAPES) ×3 IMPLANT
DRAPE SURG ORHT 6 SPLT 77X108 (DRAPES) ×2 IMPLANT
DRAPE U-SHAPE 47X51 STRL (DRAPES) ×3 IMPLANT
DURAPREP 26ML APPLICATOR (WOUND CARE) ×3 IMPLANT
DW OUTFLOW CASSETTE/TUBE SET (MISCELLANEOUS) ×3 IMPLANT
ELECT REM PT RETURN 9FT ADLT (ELECTROSURGICAL) ×3
ELECTRODE REM PT RTRN 9FT ADLT (ELECTROSURGICAL) ×1 IMPLANT
FIBER TAPE 2MM (SUTURE) ×6 IMPLANT
FIBERSTICK 2 (SUTURE) IMPLANT
GAUZE SPONGE 4X4 12PLY STRL (GAUZE/BANDAGES/DRESSINGS) ×3 IMPLANT
GAUZE SPONGE 4X4 16PLY XRAY LF (GAUZE/BANDAGES/DRESSINGS) ×2 IMPLANT
GAUZE XEROFORM 1X8 LF (GAUZE/BANDAGES/DRESSINGS) ×3 IMPLANT
GLOVE BIO SURGEON STRL SZ7 (GLOVE) ×3 IMPLANT
GLOVE BIO SURGEON STRL SZ8 (GLOVE) ×3 IMPLANT
GLOVE BIOGEL PI IND STRL 7.5 (GLOVE) ×1 IMPLANT
GLOVE BIOGEL PI INDICATOR 7.5 (GLOVE) ×2
GLOVE INDICATOR 8.0 STRL GRN (GLOVE) ×3 IMPLANT
GOWN STRL REUS W/ TWL LRG LVL3 (GOWN DISPOSABLE) ×1 IMPLANT
GOWN STRL REUS W/TWL LRG LVL3 (GOWN DISPOSABLE) ×3
GOWN STRL REUS W/TWL XL LVL3 (GOWN DISPOSABLE) ×3 IMPLANT
IV NS IRRIG 3000ML ARTHROMATIC (IV SOLUTION) ×12 IMPLANT
KIT TURNOVER CYSTO (KITS) ×3 IMPLANT
LASSO SUT 90 DEGREE (SUTURE) IMPLANT
MANIFOLD NEPTUNE II (INSTRUMENTS) ×3 IMPLANT
NDL 1/2 CIR CATGUT .05X1.09 (NEEDLE) IMPLANT
NDL SCORPION MULTI FIRE (NEEDLE) IMPLANT
NEEDLE 1/2 CIR CATGUT .05X1.09 (NEEDLE) IMPLANT
NEEDLE HYPO 22GX1.5 SAFETY (NEEDLE) IMPLANT
NEEDLE SCORPION MULTI FIRE (NEEDLE) IMPLANT
NS IRRIG 500ML POUR BTL (IV SOLUTION) IMPLANT
PACK ARTHROSCOPY DSU (CUSTOM PROCEDURE TRAY) ×3 IMPLANT
PACK BASIN DAY SURGERY FS (CUSTOM PROCEDURE TRAY) ×3 IMPLANT
PAD ABD 8X10 STRL (GAUZE/BANDAGES/DRESSINGS) ×3 IMPLANT
PAD ARMBOARD 7.5X6 YLW CONV (MISCELLANEOUS) IMPLANT
PENCIL BUTTON HOLSTER BLD 10FT (ELECTRODE) IMPLANT
PORT APPOLLO RF 90DEGREE MULTI (SURGICAL WAND) ×3 IMPLANT
SLEEVE ARM SUSPENSION SYSTEM (MISCELLANEOUS) ×3 IMPLANT
SLING S3 LATERAL DISP (MISCELLANEOUS) IMPLANT
SLING ULTRA II L (ORTHOPEDIC SUPPLIES) ×5 IMPLANT
SLING ULTRA II LARGE (SOFTGOODS) ×2 IMPLANT
SPONGE LAP 4X18 RFD (DISPOSABLE) IMPLANT
SUT ETHILON 3 0 PS 1 (SUTURE) ×3 IMPLANT
SUT FIBERWIRE #2 38 T-5 BLUE (SUTURE)
SUT LASSO 45 DEGREE LEFT (SUTURE) IMPLANT
SUT LASSO 45D RIGHT (SUTURE) IMPLANT
SUT PDS AB 0 CT1 36 (SUTURE) IMPLANT
SUT TIGER TAPE 7 IN WHITE (SUTURE) IMPLANT
SUT VIC AB 0 CT1 36 (SUTURE) IMPLANT
SUT VIC AB 2-0 CT1 27 (SUTURE)
SUT VIC AB 2-0 CT1 TAPERPNT 27 (SUTURE) IMPLANT
SUTURE FIBERWR #2 38 T-5 BLUE (SUTURE) IMPLANT
SUTURE TAPE 1.3 40 TPR END (SUTURE) ×2 IMPLANT
SUTURETAPE 1.3 40 TPR END (SUTURE) ×9
SYR CONTROL 10ML LL (SYRINGE) IMPLANT
SYR TB 1ML 27GX1/2 SAFE (SYRINGE) ×1 IMPLANT
SYR TB 1ML 27GX1/2 SAFETY (SYRINGE) ×3
TAPE CLOTH SURG 6X10 WHT LF (GAUZE/BANDAGES/DRESSINGS) ×2 IMPLANT
TAPE FIBER 2MM 7IN #2 BLUE (SUTURE) ×2 IMPLANT
TOWEL OR 17X26 10 PK STRL BLUE (TOWEL DISPOSABLE) ×3 IMPLANT
TUBE CONNECTING 12'X1/4 (SUCTIONS) ×2
TUBE CONNECTING 12X1/4 (SUCTIONS) ×4 IMPLANT
TUBING ARTHROSCOPY IRRIG 16FT (MISCELLANEOUS) ×3 IMPLANT
WATER STERILE IRR 500ML POUR (IV SOLUTION) ×3 IMPLANT

## 2019-05-23 NOTE — Op Note (Signed)
Dictated#preop diagnosis right shoulder recurrent rotator cuff tear. Postop diagnosis right shoulder recurrent full-thickness rotator cuff tear Procedure right shoulder arthroscopic rotator cuff repair Surgeon Hart Robinsons, MD Assistant Secundino Raymona Haus,pac Anesthesia interscalene block general Estimated blood loss minimal Drains none Complications none Disposition PACU stable.  Operative details patient was counseled in the holding area correct side identified marked signed appropriately IV started sedation given and block of been administered IV antibiotics were given.  Since had the operating room placed supine position under general anesthesia.  Turned to a left lateral decubitus position properly padded and bumped PAS stockings were applied for DVT prophylaxis utilized the Arthrex sterile shoulder holder 30 degrees of abduction 10 degrees of forward flexion and 15 point to 20 pounds longitudinal traction making sure would not did not over distract her.  Posterior portal was established scope placed into glenohumeral joint some extravasation of fluid was felt and seen into the subacromial region the biceps labrum glenohumeral articular cartilage and ligaments were all normal there was a suture in the shoulder which appeared be somewhat loose from the previous surgery anterior portal was made from outside in technique the rotator cuff interval and it was removed.  Subacromial region revealed a recurrent rotator cuff tear of the supraspinatus anteriorly and into the infraspinatus posteriorly I felt that this point time some the previous tear had healed but then there was also extension of the tear anterior and posteriorly and some recurrence in the central section explained that this area was taken down to full-thickness with a shaver all sutures were removed.  Cuff tear was mobilized.  The rotator cuff tissue was of fair quality.  Soft tissue was taken off the greater tuberosity with cautery smoothed  down with a bur for a bleeding response.  Small puncture was made superiorly Arthrex peek anchors placed to the greater tuberosity 4 limbs of suture were then placed into the graft Tatar cuff all fiber tapes.  Arms and abducted and to another swivel lock for suture bridge double row technique.  Also put the safety suture from the anchor back into the rotator cuff anteriorly tied that down with arthroscopic knots.  This point time I felt like the bone was satisfactory the cuff was fair he had stable construct could not be improved upon irrigation scope was removed.  Portals were closed with 4 nylon suture sterile dressing applied turned supine awakened taken from the operating room to the PACU in stable condition.  She will be stabilized in PACU and discharged home.  She will shower in 2 days check in office in 2 weeks elbow hand and wrist only we will hold physical therapy until 3 weeks from surgery.`

## 2019-05-23 NOTE — Interval H&P Note (Signed)
History and Physical Interval Note:  05/23/2019 1:43 PM  Monica Petty  has presented today for surgery, with the diagnosis of Right shoulder recurrent rotator cuff tear.  The various methods of treatment have been discussed with the patient and family. After consideration of risks, benefits and other options for treatment, the patient has consented to  Procedure(s) with comments: SHOULDER ARTHROSCOPY WITH revision rotator cuff repair and  debirdement (Right) - interscalene block as a surgical intervention.  The patient's history has been reviewed, patient examined, no change in status, stable for surgery.  I have reviewed the patient's chart and labs.  Questions were answered to the patient's satisfaction.     Scotti Kosta ANDREW

## 2019-05-23 NOTE — Transfer of Care (Signed)
Immediate Anesthesia Transfer of Care Note  Patient: Monica Petty  Procedure(s) Performed: SHOULDER ARTHROSCOPY WITH revision rotator cuff repair and  debirdement (Right Shoulder)  Patient Location: PACU  Anesthesia Type:GA combined with regional for post-op pain  Level of Consciousness: awake, alert , oriented and patient cooperative  Airway & Oxygen Therapy: Patient Spontanous Breathing and Patient connected to nasal cannula oxygen  Post-op Assessment: Report given to RN, Post -op Vital signs reviewed and stable and Patient moving all extremities  Post vital signs: Reviewed and stable  Last Vitals:  Vitals Value Taken Time  BP 139/67 05/23/19 1532  Temp    Pulse 95 05/23/19 1537  Resp 18 05/23/19 1537  SpO2 96 % 05/23/19 1537  Vitals shown include unvalidated device data.  Last Pain:  Vitals:   05/23/19 1109  TempSrc: Oral         Complications: No apparent anesthesia complications

## 2019-05-23 NOTE — Anesthesia Preprocedure Evaluation (Signed)
Anesthesia Evaluation  Patient identified by MRN, date of birth, ID band Patient awake    Reviewed: Allergy & Precautions, NPO status , Patient's Chart, lab work & pertinent test results  Airway Mallampati: II  TM Distance: >3 FB Neck ROM: Full    Dental  (+) Teeth Intact, Dental Advisory Given   Pulmonary neg pulmonary ROS,    Pulmonary exam normal breath sounds clear to auscultation       Cardiovascular hypertension, Pt. on medications Normal cardiovascular exam Rhythm:Regular Rate:Normal     Neuro/Psych  Headaches, PSYCHIATRIC DISORDERS Anxiety    GI/Hepatic Neg liver ROS, GERD  ,  Endo/Other  negative endocrine ROSObesity   Renal/GU negative Renal ROS     Musculoskeletal negative musculoskeletal ROS (+) Right shoulder recurrent rotator cuff tear   Abdominal   Peds  Hematology negative hematology ROS (+)   Anesthesia Other Findings Day of surgery medications reviewed with the patient.  Reproductive/Obstetrics                             Anesthesia Physical Anesthesia Plan  ASA: II  Anesthesia Plan: General   Post-op Pain Management:  Regional for Post-op pain   Induction: Intravenous  PONV Risk Score and Plan: 3 and Midazolam, Dexamethasone and Ondansetron  Airway Management Planned: Oral ETT  Additional Equipment:   Intra-op Plan:   Post-operative Plan: Extubation in OR  Informed Consent: I have reviewed the patients History and Physical, chart, labs and discussed the procedure including the risks, benefits and alternatives for the proposed anesthesia with the patient or authorized representative who has indicated his/her understanding and acceptance.     Dental advisory given  Plan Discussed with: CRNA  Anesthesia Plan Comments:         Anesthesia Quick Evaluation

## 2019-05-23 NOTE — Discharge Instructions (Signed)
Information for Discharge Teaching: EXPAREL (bupivacaine liposome injectable suspension)   Your surgeon or anesthesiologist gave you EXPAREL(bupivacaine) to help control your pain after surgery.   EXPAREL is a local anesthetic that provides pain relief by numbing the tissue around the surgical site.  EXPAREL is designed to release pain medication over time and can control pain for up to 72 hours.  Depending on how you respond to EXPAREL, you may require less pain medication during your recovery.  Possible side effects:  Temporary loss of sensation or ability to move in the area where bupivacaine was injected.  Nausea, vomiting, constipation  Rarely, numbness and tingling in your mouth or lips, lightheadedness, or anxiety may occur.  Call your doctor right away if you think you may be experiencing any of these sensations, or if you have other questions regarding possible side effects.  Follow all other discharge instructions given to you by your surgeon or nurse. Eat a healthy diet and drink plenty of water or other fluids.  If you return to the hospital for any reason within 96 hours following the administration of EXPAREL, it is important for health care providers to know that you have received this anesthetic. A teal colored band has been placed on your arm with the date, time and amount of EXPAREL you have received in order to alert and inform your health care providers. Please leave this armband in place for the full 96 hours following administration, and then you may remove the band. Post Anesthesia Home Care Instructions  Activity: Get plenty of rest for the remainder of the day. A responsible individual must stay with you for 24 hours following the procedure.  For the next 24 hours, DO NOT: -Drive a car -Paediatric nurse -Drink alcoholic beverages -Take any medication unless instructed by your physician -Make any legal decisions or sign important papers.  Meals: Start with  liquid foods such as gelatin or soup. Progress to regular foods as tolerated. Avoid greasy, spicy, heavy foods. If nausea and/or vomiting occur, drink only clear liquids until the nausea and/or vomiting subsides. Call your physician if vomiting continues.  Special Instructions/Symptoms: Your throat may feel dry or sore from the anesthesia or the breathing tube placed in your throat during surgery. If this causes discomfort, gargle with warm salt water. The discomfort should disappear within 24 hours.  If you had a scopolamine patch placed behind your ear for the management of post- operative nausea and/or vomiting:  1. The medication in the patch is effective for 72 hours, after which it should be removed.  Wrap patch in a tissue and discard in the trash. Wash hands thoroughly with soap and water. 2. You may remove the patch earlier than 72 hours if you experience unpleasant side effects which may include dry mouth, dizziness or visual disturbances. 3. Avoid touching the patch. Wash your hands with soap and water after contact with the patch.   Call your surgeon if you experience:    1.  Fever over 101.0. 2.  Nausea and/or vomiting. 3.  Extreme swelling or bruising at the surgical site. 4.  Continued bleeding from the incision. 5.  Increased pain, redness or drainage from the incision. 6.  Problems related to your pain medication. 7.  Any problems and/or concerns

## 2019-05-23 NOTE — H&P (Signed)
Monica Petty is an 53 y.o. female.   Chief Complaint: right shoulder pain HPI: This is a very pleasant 53 year old female who is here today for right shoulder pain.  She had a recent injury to this right shoulder.  She has had a previous surgery on the shoulder where we did a rotator cuff repair.  Injury to the shoulder brought her back into the office.  She has a possible re-tear of the repair or an elongation of the tear according to MRI.  Otherwise patient is very healthy.  Past Medical History:  Diagnosis Date  . Anxiety   . Diverticulosis of colon   . GERD (gastroesophageal reflux disease)   . History of colon polyps   . History of concussion    1987  fell of horse--- no residual  . Hypertension   . Migraine    occasional  . Rotator cuff tear    recurrent right side    Past Surgical History:  Procedure Laterality Date  . COLONOSCOPY  06/06/2016  . LAPAROSCOPIC CHOLECYSTECTOMY  06-03-2008   dr Ulyess Blossom  [facility]  . REDUCTION MAMMAPLASTY Bilateral 03-05-2002   dr barber  [facility]  . SHOULDER ARTHROSCOPY WITH ROTATOR CUFF REPAIR AND SUBACROMIAL DECOMPRESSION Right 04/17/2018   Procedure: Right shoulder evaluation under anesthesia, scope, debridement, subacromial decompression, rotator cuff repair;  Surgeon: Sydnee Cabal, MD;  Location: [facility];  Service: Orthopedics;  Laterality: Right;  120 mins General with Intra Scalene Block  . VAGINAL HYSTERECTOMY  02-12-2002   dr Nori Riis  [facility]  . WISDOM TOOTH EXTRACTION  teen    Family History  Problem Relation Age of Onset  . Colon polyps Mother   . Breast cancer Paternal Aunt   . Breast cancer Other   . Colon cancer Neg Hx    Social History:  reports that she has never smoked. She has never used smokeless tobacco. She reports current alcohol use of about 7.0 standard drinks of alcohol per week. She reports that she does not use drugs.  Allergies:  Allergies  Allergen Reactions  . Codeine Other (See Comments)     tinnitis and photophobia   . Sulfa Antibiotics Other (See Comments)    tinnitis and photophobia  . Penicillins Rash    Medications Prior to Admission  Medication Sig Dispense Refill  . ALPRAZolam (XANAX) 0.5 MG tablet Take 0.5 mg by mouth at bedtime.     . Cimetidine (TAGAMET PO) Take by mouth as needed.    . cycloSPORINE (RESTASIS) 0.05 % ophthalmic emulsion Place 1 drop into both eyes 2 (two) times daily.    Marland Kitchen estradiol (ESTRACE) 0.1 MG/GM vaginal cream Place 1 Applicatorful vaginally once a week.     . estrogens, conjugated, (PREMARIN) 1.25 MG tablet Take 1.25 mg by mouth daily.    . hydrochlorothiazide (HYDRODIURIL) 25 MG tablet Take 25 mg by mouth every morning.     . Ibuprofen (ADVIL PO) Take by mouth as needed.     Marland Kitchen lisinopril (PRINIVIL,ZESTRIL) 5 MG tablet Take 5 mg by mouth every morning.     . methocarbamol (ROBAXIN) 500 MG tablet Take 1 tablet (500 mg total) by mouth every 8 (eight) hours as needed for muscle spasms. 50 tablet 2  . Multiple Vitamins-Minerals (MULTIVITAMIN ADULT PO) Take 1 capsule by mouth daily.     . Multiple Vitamins-Minerals (ZINC PO) Take by mouth daily.    . Omega-3 Fatty Acids (FISH OIL) 1000 MG CAPS Take by mouth daily.    Marland Kitchen  tretinoin (RETIN-A) 0.025 % gel Apply topically daily.       Results for orders placed or performed during the hospital encounter of 05/23/19 (from the past 48 hour(s))  I-STAT, chem 8     Status: Abnormal   Collection Time: 05/23/19 12:00 PM  Result Value Ref Range   Sodium 141 135 - 145 mmol/L   Potassium 3.6 3.5 - 5.1 mmol/L   Chloride 103 98 - 111 mmol/L   BUN 13 6 - 20 mg/dL   Creatinine, Ser 0.70 0.44 - 1.00 mg/dL   Glucose, Bld 110 (H) 70 - 99 mg/dL   Calcium, Ion 1.25 1.15 - 1.40 mmol/L   TCO2 28 22 - 32 mmol/L   Hemoglobin 15.6 (H) 12.0 - 15.0 g/dL   HCT 46.0 36.0 - 46.0 %   No results found.  Review of Systems  Constitutional: Negative.   HENT: Negative.   Eyes: Negative.   Respiratory: Negative.    Cardiovascular: Negative.   Gastrointestinal: Negative.   Musculoskeletal: Positive for joint pain.  Skin: Negative.   Neurological: Negative.   Endo/Heme/Allergies: Negative.   Psychiatric/Behavioral: Negative.     Blood pressure (!) 162/94, pulse 97, temperature 97.6 F (36.4 C), temperature source Oral, resp. rate 16, height 5\' 6"  (1.676 m), weight 90.8 kg, SpO2 98 %. Physical Exam  Constitutional: She is oriented to person, place, and time. She appears well-developed and well-nourished. No distress.  HENT:  Head: Normocephalic and atraumatic.  Eyes: Pupils are equal, round, and reactive to light. Conjunctivae and EOM are normal.  Neck: Normal range of motion.  Musculoskeletal:     Comments: Patient has pain with range of motion of her right arm.  Decreased strength in the right shoulder.  Neurological: She is alert and oriented to person, place, and time.  Skin: Skin is warm and dry.     Assessment/Plan Patient is here today for a right shoulder arthroscopy and a revision rotator cuff repair and debridement.  Risks and benefits of surgery were discussed with the patient.  All questions were answered.  Consent was signed.  Patient would like to proceed with surgery today.  Drue Novel, PA 05/23/2019, 1:31 PM

## 2019-05-23 NOTE — Anesthesia Procedure Notes (Signed)
Anesthesia Regional Block: Interscalene brachial plexus block   Pre-Anesthetic Checklist: ,, timeout performed, Correct Patient, Correct Site, Correct Laterality, Correct Procedure, Correct Position, site marked, Risks and benefits discussed,  Surgical consent,  Pre-op evaluation,  At surgeon's request and post-op pain management  Laterality: Right  Prep: chloraprep       Needles:  Injection technique: Single-shot  Needle Type: Echogenic Stimulator Needle     Needle Length: 5cm  Needle Gauge: 22     Additional Needles:   Procedures:,,,, ultrasound used (permanent image in chart),,,,  Narrative:  Start time: 05/23/2019 12:46 PM End time: 05/23/2019 12:55 PM Injection made incrementally with aspirations every 5 mL.  Performed by: Personally  Anesthesiologist: Catalina Gravel, MD  Additional Notes: Functioning IV was confirmed and monitors were applied.  A 66mm 22ga Arrow echogenic stimulator needle was used. Sterile prep and drape, hand hygiene, and sterile gloves were used.  Negative aspiration and negative test dose prior to incremental administration of local anesthetic. The patient tolerated the procedure well.  Ultrasound guidance: relevent anatomy identified, needle position confirmed, local anesthetic spread visualized around nerve(s), vascular puncture avoided.  Image printed for medical record.

## 2019-05-23 NOTE — Progress Notes (Signed)
Assisted Dr. Turk with right, ultrasound guided, interscalene  block. Side rails up, monitors on throughout procedure. See vital signs in flow sheet. Tolerated Procedure well. 

## 2019-05-23 NOTE — Anesthesia Procedure Notes (Signed)
Procedure Name: Intubation Date/Time: 05/23/2019 1:47 PM Performed by: Zyah Gomm D, CRNA Pre-anesthesia Checklist: Patient identified, Emergency Drugs available, Suction available and Patient being monitored Patient Re-evaluated:Patient Re-evaluated prior to induction Oxygen Delivery Method: Circle system utilized Preoxygenation: Pre-oxygenation with 100% oxygen Induction Type: IV induction Ventilation: Mask ventilation without difficulty Laryngoscope Size: Mac and 3 Grade View: Grade I Tube type: Oral Tube size: 7.0 mm Number of attempts: 1 Airway Equipment and Method: Stylet Placement Confirmation: ETT inserted through vocal cords under direct vision,  positive ETCO2 and breath sounds checked- equal and bilateral Secured at: 22 cm Tube secured with: Tape Dental Injury: Teeth and Oropharynx as per pre-operative assessment

## 2019-05-23 NOTE — Interval H&P Note (Signed)
History and Physical Interval Note:  05/23/2019 1:43 PM  Monica Petty  has presented today for surgery, with the diagnosis of Right shoulder recurrent rotator cuff tear.  The various methods of treatment have been discussed with the patient and family. After consideration of risks, benefits and other options for treatment, the patient has consented to  Procedure(s) with comments: SHOULDER ARTHROSCOPY WITH revision rotator cuff repair and  debirdement (Right) - interscalene block as a surgical intervention.  The patient's history has been reviewed, patient examined, no change in status, stable for surgery.  I have reviewed the patient's chart and labs.  Questions were answered to the patient's satisfaction.     Champion Corales ANDREW

## 2019-05-23 NOTE — Anesthesia Postprocedure Evaluation (Signed)
Anesthesia Post Note  Patient: Monica Petty  Procedure(s) Performed: SHOULDER ARTHROSCOPY WITH revision rotator cuff repair and  debirdement (Right Shoulder)     Patient location during evaluation: PACU Anesthesia Type: General Level of consciousness: sedated and patient cooperative Pain management: pain level controlled Vital Signs Assessment: post-procedure vital signs reviewed and stable Respiratory status: spontaneous breathing Cardiovascular status: stable Anesthetic complications: no    Last Vitals:  Vitals:   05/23/19 1600 05/23/19 1615  BP: (!) 143/78   Pulse: 100   Resp: 19   Temp:    SpO2: 91% 93%    Last Pain:  Vitals:   05/23/19 1615  TempSrc:   PainSc: 0-No pain                 Nolon Nations

## 2019-05-24 ENCOUNTER — Encounter (HOSPITAL_BASED_OUTPATIENT_CLINIC_OR_DEPARTMENT_OTHER): Payer: Self-pay | Admitting: Specialist

## 2019-05-30 ENCOUNTER — Ambulatory Visit: Payer: PRIVATE HEALTH INSURANCE | Admitting: Physical Therapy

## 2019-06-04 MED FILL — LISINOPRIL 5 MG TABLET: 5 | 90 days supply | Qty: 90 | Fill #0

## 2019-06-04 MED FILL — ALPRAZolam 0.5 MG TABS: 0.5 | 30 days supply | Qty: 90 | Fill #2

## 2019-06-13 ENCOUNTER — Encounter: Payer: Self-pay | Admitting: Physical Therapy

## 2019-06-13 ENCOUNTER — Ambulatory Visit: Payer: PRIVATE HEALTH INSURANCE | Attending: Specialist | Admitting: Physical Therapy

## 2019-06-13 ENCOUNTER — Other Ambulatory Visit: Payer: Self-pay

## 2019-06-13 DIAGNOSIS — M25511 Pain in right shoulder: Secondary | ICD-10-CM | POA: Diagnosis present

## 2019-06-13 DIAGNOSIS — M6281 Muscle weakness (generalized): Secondary | ICD-10-CM | POA: Diagnosis present

## 2019-06-13 DIAGNOSIS — M25611 Stiffness of right shoulder, not elsewhere classified: Secondary | ICD-10-CM | POA: Insufficient documentation

## 2019-06-13 NOTE — Therapy (Signed)
[facility] [place], [place], [address] Phone: [phone]   Fax:  [phone]  Physical Therapy Evaluation  Patient Details  Name: Monica Petty MRN: [id number] Date of Birth: [birth year] Referring Provider (PT): Sydnee Cabal, MD   Encounter Date: 06/13/2019  PT End of Session - 06/13/19 1451    Visit Number  1    Number of Visits  13    Date for PT Re-Evaluation  07/25/19    Authorization Type  WC- 1 eval + 12 tx    Authorization - Visit Number  1    Authorization - Number of Visits  13    PT Start Time  [id number]    PT Stop Time  1445    PT Time Calculation (min)  42 min    Equipment Utilized During Treatment  --   R shoulder abduction sling   Activity Tolerance  Patient tolerated treatment well;Patient limited by pain    Behavior During Therapy  WFL for tasks assessed/performed       Past Medical History:  Diagnosis Date  . Anxiety   . Diverticulosis of colon   . GERD (gastroesophageal reflux disease)   . History of colon polyps   . History of concussion    1987  fell of horse--- no residual  . Hypertension   . Migraine    occasional  . Rotator cuff tear    recurrent right side    Past Surgical History:  Procedure Laterality Date  . COLONOSCOPY  06/06/2016  . LAPAROSCOPIC CHOLECYSTECTOMY  06-03-2008   dr Ulyess Blossom  [facility]  . REDUCTION MAMMAPLASTY Bilateral 03-05-2002   dr barber  [facility]  . SHOULDER ARTHROSCOPY WITH ROTATOR CUFF REPAIR Right 05/23/2019   Procedure: SHOULDER ARTHROSCOPY WITH revision rotator cuff repair and  debirdement;  Surgeon: Sydnee Cabal, MD;  Location: [facility];  Service: Orthopedics;  Laterality: Right;  interscalene block  . SHOULDER ARTHROSCOPY WITH ROTATOR CUFF REPAIR AND SUBACROMIAL DECOMPRESSION Right 04/17/2018   Procedure: Right shoulder evaluation under anesthesia, scope, debridement, subacromial decompression, rotator cuff repair;   Surgeon: Sydnee Cabal, MD;  Location: [facility];  Service: Orthopedics;  Laterality: Right;  120 mins General with Intra Scalene Block  . VAGINAL HYSTERECTOMY  02-12-2002   dr Nori Riis  [facility]  . WISDOM TOOTH EXTRACTION  teen    There were no vitals filed for this visit.   Subjective Assessment - 06/13/19 1407    Subjective  Patient reports undergoing R RTC revision with debridement on 05/23/19. Is wearing her sling all day and sleeping with it on. Only out of sling when sitting. Has been performing pendulums. Pain is located over lateral shoulder. Denies N/T. Pain levels are 5-6/10 at worse and managed well by meds. Currently out of work, and expected to return to a light duty assignment once rehab progresses. Before the revision, patient was struggling with lifting, opening doors, and having discomfort with movements requiring reaching out from center of mass and with rotation. Currently struggling with being able to dress herself and do her hair.    Pertinent History  migraine, HTN, concussion, GERD, anxiety    Limitations  Lifting;Writing;House hold activities    Diagnostic tests  none recent    Patient Stated Goals  get back to 100%    Currently in Pain?  Yes    Pain Score  4     Pain Location  Shoulder    Pain  Orientation  Right    Pain Descriptors / Indicators  Aching    Pain Type  Acute pain;Surgical pain         [facility] PT Assessment - 06/13/19 1415      Assessment   Medical Diagnosis  F/U orthopedic assessment, s/p R RTC revision and debridement     Referring Provider (PT)  Sydnee Cabal, MD    Onset Date/Surgical Date  05/22/29    Hand Dominance  Right    Next MD Visit  06/30/19    Prior Therapy  yes      Precautions   Precautions  None   in sling AM and PM     Balance Screen   Has the patient fallen in the past 6 months  No    Has the patient had a decrease in activity level because of a fear of falling?   No    Is the patient reluctant to leave their  home because of a fear of falling?   No      Home Film/video editor residence    Living Arrangements  Spouse/significant other    Available Help at Discharge  Family      Prior Function   Level of Independence  Independent    Vocation  Full time employment    Vocation Requirements  OR nurse    Leisure  ride horses and snow ski      Cognition   Overall Cognitive Status  Within Functional Limits for tasks assessed      Observation/Other Assessments   Observations  in R shoulder abduction sling; R shoulder incisions well-healed    Focus on Therapeutic Outcomes (FOTO)   shoulder: 4 (96% limited, 48% predicted)      Sensation   Light Touch  Appears Intact      Coordination   Gross Motor Movements are Fluid and Coordinated  Yes      Posture/Postural Control   Posture/Postural Control  Postural limitations    Postural Limitations  Rounded Shoulders      ROM / Strength   AROM / PROM / Strength  AROM;PROM;Strength      AROM   AROM Assessment Site  Shoulder    Right/Left Shoulder  Left    Left Shoulder Flexion  157 Degrees    Left Shoulder ABduction  172 Degrees    Left Shoulder Internal Rotation  --   FIR T6   Left Shoulder External Rotation  --   FER T3     PROM   PROM Assessment Site  Shoulder    Right/Left Shoulder  Right    Right Shoulder Flexion  130 Degrees    Right Shoulder ABduction  110 Degrees   scaption   Right Shoulder Internal Rotation  23 Degrees   pain and c/o pop which dissipated   Right Shoulder External Rotation  19 Degrees      Strength   Strength Assessment Site  Shoulder    Right/Left Shoulder  Left    Left Shoulder Flexion  4+/5    Left Shoulder ABduction  4+/5    Left Shoulder Internal Rotation  5/5    Left Shoulder External Rotation  5/5      Palpation   Palpation comment  no TTP                Objective measurements completed on examination: See above findings.  PT Education -  06/13/19 1451    Education Details  prognosis, POC, HEP; edu on post surgical precautions and sling use    Person(s) Educated  Patient    Methods  Explanation;Demonstration;Tactile cues;Verbal cues;Handout    Comprehension  Verbalized understanding;Returned demonstration       PT Short Term Goals - 06/13/19 1502      PT SHORT TERM GOAL #1   Title  Patient to be independent with initial HEP.    Time  3    Period  Weeks    Status  New    Target Date  07/04/19        PT Long Term Goals - 06/13/19 1502      PT LONG TERM GOAL #1   Title  patient to be independent with advanced HEP    Time  6    Period  Weeks    Status  New    Target Date  07/25/19      PT LONG TERM GOAL #2   Title  Patient to demonstrate R shoulder AROM/PROM [facility] and without pain limiting.    Time  6    Period  Weeks    Status  New    Target Date  07/25/19      PT LONG TERM GOAL #3   Title  patient to improve R shoulder strength to >/= 4+/5 without pain    Time  6    Period  Weeks    Status  New    Target Date  07/25/19      PT LONG TERM GOAL #4   Title  Patient to demonstrate overhead reaching to place 5# object on shelf overhead with <=1/10 pain.    Time  6    Period  Weeks    Status  New    Target Date  07/25/19      PT LONG TERM GOAL #5   Title  Patient to report tolerance of dressing and grooming activities without pain limiting.    Time  6    Period  Weeks    Status  New    Target Date  07/25/19             Plan - 06/13/19 1452    Clinical Impression Statement  Patient is a 53y/o F presenting to OPPT s/p R RTC revision and debridement on 05/23/19. Patient reports good compliance with sling use and is performing pendulums at home as instructed by PA. 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 Patient is currently struggling with dressing and doing her hair. Patient today presenting with limited and  painful R shoulder PROM, well-healed incisions, and abnormal posture. Educated patient on gentle distal extremity and shoulder AAROM HEP. Patient reported understanding. Would benefit form skilled PT services 2x/week for 6 weeks to address aforementioned impairments.    Personal Factors and Comorbidities  Age;Comorbidity 3+;Time since onset of injury/illness/exacerbation;Past/Current Experience;Profession    Comorbidities  migraine, HTN, concussion, GERD, anxiety    Examination-Activity Limitations  Bathing;Bed Mobility;Sleep;Caring for Others;Carry;Toileting;Dressing;Hygiene/Grooming;Lift;Reach Overhead;Self Feeding    Examination-Participation Restrictions  Cleaning;Shop;Community Activity;Driving;Yard Work;Interpersonal Relationship;Laundry;Meal Prep    Stability/Clinical Decision Making  Stable/Uncomplicated    Clinical Decision Making  Low    Rehab Potential  Good    PT Frequency  2x / week    PT Duration  6 weeks    PT Treatment/Interventions  ADLs/Self Care Home  Management;Cryotherapy;Electrical Stimulation;Iontophoresis 4mg /ml Dexamethasone;Moist Heat;Therapeutic exercise;Therapeutic activities;Functional mobility training;Ultrasound;Neuromuscular re-education;Patient/family education;Manual techniques;Vasopneumatic Device;Taping;Energy conservation;Dry needling;Passive range of motion;Scar mobilization    PT Next Visit Plan  reassess HEP    Consulted and Agree with Plan of Care  Patient       Patient will benefit from skilled therapeutic intervention in order to improve the following deficits and impairments:  Hypomobility, Increased edema, Decreased scar mobility, Decreased activity tolerance, Decreased strength, Impaired UE functional use, Pain, Improper body mechanics, Decreased range of motion, Impaired flexibility, Postural dysfunction  Visit Diagnosis: Acute pain of right shoulder  Stiffness of right shoulder, not elsewhere classified  Muscle weakness  (generalized)     Problem List Patient Active Problem List   Diagnosis Date Noted  . S/P arthroscopy of right shoulder 04/17/2018     Janene Harvey, PT, DPT 06/13/19 3:05 PM   [facility] [address]  [place], [place], [address] Phone: [phone]   Fax:  [phone]  Name: Monica Petty MRN: [id number] Date of Birth: 1966/09/13

## 2019-06-18 ENCOUNTER — Other Ambulatory Visit: Payer: Self-pay

## 2019-06-18 ENCOUNTER — Ambulatory Visit: Payer: PRIVATE HEALTH INSURANCE

## 2019-06-18 DIAGNOSIS — M25511 Pain in right shoulder: Secondary | ICD-10-CM

## 2019-06-18 DIAGNOSIS — M25611 Stiffness of right shoulder, not elsewhere classified: Secondary | ICD-10-CM

## 2019-06-18 DIAGNOSIS — M6281 Muscle weakness (generalized): Secondary | ICD-10-CM

## 2019-06-18 NOTE — Therapy (Addendum)
[facility] [place], [place], [address] Phone: [phone]   Fax:  [phone]  Physical Therapy Treatment  Patient Details  Name: Monica Petty MRN: [id number] Date of Birth: 1965-11-06 Referring Provider (PT): Sydnee Cabal, MD   Encounter Date: 06/18/2019  PT End of Session - 06/18/19 1542    Visit Number  2    Number of Visits  13    Date for PT Re-Evaluation  07/25/19    Authorization Type  WC- 1 eval + 12 tx    Authorization - Visit Number  2    Authorization - Number of Visits  13    PT Start Time  1537    PT Stop Time  1616    PT Time Calculation (min)  39 min    Equipment Utilized During Treatment  --   R shoulder abduction sling   Activity Tolerance  Patient tolerated treatment well    Behavior During Therapy  WFL for tasks assessed/performed       Past Medical History:  Diagnosis Date  . Anxiety   . Diverticulosis of colon   . GERD (gastroesophageal reflux disease)   . History of colon polyps   . History of concussion    1987  fell of horse--- no residual  . Hypertension   . Migraine    occasional  . Rotator cuff tear    recurrent right side    Past Surgical History:  Procedure Laterality Date  . COLONOSCOPY  06/06/2016  . LAPAROSCOPIC CHOLECYSTECTOMY  06-03-2008   dr Ulyess Blossom  [facility]  . REDUCTION MAMMAPLASTY Bilateral 03-05-2002   dr barber  [facility]  . SHOULDER ARTHROSCOPY WITH ROTATOR CUFF REPAIR Right 05/23/2019   Procedure: SHOULDER ARTHROSCOPY WITH revision rotator cuff repair and  debirdement;  Surgeon: Sydnee Cabal, MD;  Location: [facility];  Service: Orthopedics;  Laterality: Right;  interscalene block  . SHOULDER ARTHROSCOPY WITH ROTATOR CUFF REPAIR AND SUBACROMIAL DECOMPRESSION Right 04/17/2018   Procedure: Right shoulder evaluation under anesthesia, scope, debridement, subacromial decompression, rotator cuff repair;  Surgeon: Sydnee Cabal, MD;   Location: [facility];  Service: Orthopedics;  Laterality: Right;  120 mins General with Intra Scalene Block  . VAGINAL HYSTERECTOMY  02-12-2002   dr Nori Riis  [facility]  . WISDOM TOOTH EXTRACTION  teen    There were no vitals filed for this visit.  Subjective Assessment - 06/18/19 1544    Subjective  Pt. reporting she has been performing pendulums.    Pertinent History  migraine, HTN, concussion, GERD, anxiety    Diagnostic tests  none recent    Patient Stated Goals  get back to 100%    Currently in Pain?  No/denies    Pain Score  0-No pain   Pain/"stiffness" rising to 3/10 at worst in mornings   Pain Location  Shoulder    Pain Orientation  Right    Pain Descriptors / Indicators  Aching    Pain Type  Acute pain;Surgical pain    Multiple Pain Sites  No         [facility] PT Assessment - 06/18/19 0001      Assessment   Medical Diagnosis  F/U orthopedic assessment, s/p R RTC revision and debridement     Referring Provider (PT)  Sydnee Cabal, MD    Onset Date/Surgical Date  05/23/19    Hand Dominance  Right    Next MD Visit  07/01/19  Prior Therapy  yes                   [facility] PT Treatment/Exercise - 06/18/19 0001      Shoulder Exercises: Supine   External Rotation  Right;AAROM;10 reps    External Rotation Limitations  AAROM wand; elbow elevated on pillow by side       Shoulder Exercises: Seated   Retraction  Both;10 reps   sitting and standing; 2 sets    Retraction Limitations  5" hold       Shoulder Exercises: Standing   Other Standing Exercises  R shoulder pendulums CW, CCW, R/L, front/back x 30 sec eac       Shoulder Exercises: Isometric Strengthening   Flexion Limitations  5" x 10 reps   very light push   Extension Limitations  5" x 10 reps    very light push   External Rotation Limitations  5" x 10 reps   very light push   Internal Rotation Limitations  5" x 10 reps   very light push   ABduction Limitations  5" x 10 reps   very light  push     Manual Therapy   Manual Therapy  Passive ROM    Passive ROM  R shoulder PROM within protocol restrictions - cueing to relax musculature              PT Education - 06/18/19 1847    Education Details  HEP update;  shoulder isometrics    Person(s) Educated  Patient    Methods  Explanation;Demonstration;Verbal cues;Handout    Comprehension  Verbalized understanding;Returned demonstration;Verbal cues required       PT Short Term Goals - 06/18/19 1543      PT SHORT TERM GOAL #1   Title  Patient to be independent with initial HEP.    Time  3    Period  Weeks    Status  On-going    Target Date  07/04/19        PT Long Term Goals - 06/18/19 1543      PT LONG TERM GOAL #1   Title  patient to be independent with advanced HEP    Time  6    Period  Weeks    Status  On-going      PT LONG TERM GOAL #2   Title  Patient to demonstrate R shoulder AROM/PROM WFL and without pain limiting.    Time  6    Period  Weeks    Status  On-going      PT LONG TERM GOAL #3   Title  patient to improve R shoulder strength to >/= 4+/5 without pain    Time  6    Period  Weeks    Status  On-going      PT LONG TERM GOAL #4   Title  Patient to demonstrate overhead reaching to place 5# object on shelf overhead with <=1/10 pain.    Time  6    Period  Weeks    Status  On-going      PT LONG TERM GOAL #5   Title  Patient to report tolerance of dressing and grooming activities without pain limiting.    Time  6    Period  Weeks    Status  On-going            Plan - 06/18/19 1543    Clinical Impression Statement  Edelyn reporting she has been performing the  pendulums without issue.  Sleep and awake in sling except to perform her exercises.  Tolerated addition of R shoulder isometrics per protocol well today and ended session with Ikeya declining modalities due to time constraints.  Will continue to progress per protocol.    Personal Factors and Comorbidities  Age;Comorbidity  3+;Time since onset of injury/illness/exacerbation;Past/Current Experience;Profession    Comorbidities  migraine, HTN, concussion, GERD, anxiety    Rehab Potential  Good    PT Treatment/Interventions  ADLs/Self Care Home Management;Cryotherapy;Electrical Stimulation;Iontophoresis 4mg /ml Dexamethasone;Moist Heat;Therapeutic exercise;Therapeutic activities;Functional mobility training;Ultrasound;Neuromuscular re-education;Patient/family education;Manual techniques;Vasopneumatic Device;Taping;Energy conservation;Dry needling;Passive range of motion;Scar mobilization    PT Next Visit Plan  progress per protocol    Consulted and Agree with Plan of Care  Patient       Patient will benefit from skilled therapeutic intervention in order to improve the following deficits and impairments:  Hypomobility, Increased edema, Decreased scar mobility, Decreased activity tolerance, Decreased strength, Impaired UE functional use, Pain, Improper body mechanics, Decreased range of motion, Impaired flexibility, Postural dysfunction  Visit Diagnosis: Acute pain of right shoulder  Stiffness of right shoulder, not elsewhere classified  Muscle weakness (generalized)     Problem List Patient Active Problem List   Diagnosis Date Noted  . S/P arthroscopy of right shoulder 04/17/2018    Bess Harvest, PTA 06/18/19 6:48 PM   [facility] [address]  [place], [place], [address] Phone: [phone]   Fax:  [phone]  Name: YAMILET SAUCER MRN: [id number] Date of Birth: 1966/01/20

## 2019-06-20 ENCOUNTER — Encounter: Payer: Self-pay | Admitting: Physical Therapy

## 2019-06-25 ENCOUNTER — Ambulatory Visit: Payer: PRIVATE HEALTH INSURANCE | Attending: Specialist

## 2019-06-25 ENCOUNTER — Other Ambulatory Visit: Payer: Self-pay

## 2019-06-25 DIAGNOSIS — M6281 Muscle weakness (generalized): Secondary | ICD-10-CM | POA: Diagnosis present

## 2019-06-25 DIAGNOSIS — R29898 Other symptoms and signs involving the musculoskeletal system: Secondary | ICD-10-CM | POA: Diagnosis present

## 2019-06-25 DIAGNOSIS — M25511 Pain in right shoulder: Secondary | ICD-10-CM | POA: Diagnosis not present

## 2019-06-25 DIAGNOSIS — M25611 Stiffness of right shoulder, not elsewhere classified: Secondary | ICD-10-CM | POA: Insufficient documentation

## 2019-06-25 NOTE — Therapy (Signed)
[facility] [place], [place], [address] Phone: [phone]   Fax:  [phone]  Physical Therapy Treatment  Patient Details  Name: Monica Petty MRN: [id number] Date of Birth: 12-26-1965 Referring Provider (PT): Sydnee Cabal, MD   Encounter Date: 06/25/2019  PT End of Session - 06/25/19 1405    Visit Number  3    Number of Visits  13    Date for PT Re-Evaluation  07/25/19    Authorization Type  WC- 1 eval + 12 tx    Authorization - Visit Number  3    Authorization - Number of Visits  13    PT Start Time  1400    PT Stop Time  1456    PT Time Calculation (min)  56 min    Equipment Utilized During Treatment  --   R shoulder abduction sling   Activity Tolerance  Patient tolerated treatment well    Behavior During Therapy  WFL for tasks assessed/performed       Past Medical History:  Diagnosis Date  . Anxiety   . Diverticulosis of colon   . GERD (gastroesophageal reflux disease)   . History of colon polyps   . History of concussion    1987  fell of horse--- no residual  . Hypertension   . Migraine    occasional  . Rotator cuff tear    recurrent right side    Past Surgical History:  Procedure Laterality Date  . COLONOSCOPY  06/06/2016  . LAPAROSCOPIC CHOLECYSTECTOMY  06-03-2008   dr Ulyess Blossom  [facility]  . REDUCTION MAMMAPLASTY Bilateral 03-05-2002   dr barber  [facility]  . SHOULDER ARTHROSCOPY WITH ROTATOR CUFF REPAIR Right 05/23/2019   Procedure: SHOULDER ARTHROSCOPY WITH revision rotator cuff repair and  debirdement;  Surgeon: Sydnee Cabal, MD;  Location: [facility];  Service: Orthopedics;  Laterality: Right;  interscalene block  . SHOULDER ARTHROSCOPY WITH ROTATOR CUFF REPAIR AND SUBACROMIAL DECOMPRESSION Right 04/17/2018   Procedure: Right shoulder evaluation under anesthesia, scope, debridement, subacromial decompression, rotator cuff repair;  Surgeon: Sydnee Cabal, MD;   Location: [facility];  Service: Orthopedics;  Laterality: Right;  120 mins General with Intra Scalene Block  . VAGINAL HYSTERECTOMY  02-12-2002   dr Nori Riis  [facility]  . WISDOM TOOTH EXTRACTION  teen    There were no vitals filed for this visit.  Subjective Assessment - 06/25/19 1403    Subjective  Pt. reporting some discomfort with isometric shoulder abd, ER however pain is short lasting.  Pt. reporting her son left to return to [place], [place] on Saturday.    Pertinent History  migraine, HTN, concussion, GERD, anxiety    Diagnostic tests  none recent    Patient Stated Goals  get back to 100%    Currently in Pain?  No/denies    Pain Score  0-No pain   up to 4/10 pain at worst   Pain Location  Shoulder    Pain Orientation  Right    Pain Descriptors / Indicators  Aching    Pain Type  Acute pain;Surgical pain    Multiple Pain Sites  No                       [facility] Adult PT Treatment/Exercise - 06/25/19 0001      Shoulder Exercises: Supine   External Rotation  Right;AAROM;10 reps    External Rotation Limitations  AAROM  wand; elbow elevated on pillow by side       Shoulder Exercises: Seated   Retraction  Both;15 reps    Retraction Limitations  5" hold       Shoulder Exercises: Isometric Strengthening   Flexion Limitations  5" x 10 reps    Extension Limitations  5" x 10 reps     External Rotation Limitations  5" x 10 reps    Internal Rotation Limitations  5" x 10 reps    ABduction Limitations  5" x 10 reps      Modalities   Modalities  Vasopneumatic      Vasopneumatic   Number Minutes Vasopneumatic   15 minutes    Vasopnuematic Location   Shoulder    Vasopneumatic Pressure  Low    Vasopneumatic Temperature   Coldest      Manual Therapy   Manual Therapy  Passive ROM    Passive ROM  R shoulder passive motion ~ flexion 120 dg, abd 110 dg, ER 35 dg                 PT Short Term Goals - 06/18/19 1543      PT SHORT TERM GOAL #1   Title  Patient  to be independent with initial HEP.    Time  3    Period  Weeks    Status  On-going    Target Date  07/04/19        PT Long Term Goals - 06/18/19 1543      PT LONG TERM GOAL #1   Title  patient to be independent with advanced HEP    Time  6    Period  Weeks    Status  On-going      PT LONG TERM GOAL #2   Title  Patient to demonstrate R shoulder AROM/PROM WFL and without pain limiting.    Time  6    Period  Weeks    Status  On-going      PT LONG TERM GOAL #3   Title  patient to improve R shoulder strength to >/= 4+/5 without pain    Time  6    Period  Weeks    Status  On-going      PT LONG TERM GOAL #4   Title  Patient to demonstrate overhead reaching to place 5# object on shelf overhead with <=1/10 pain.    Time  6    Period  Weeks    Status  On-going      PT LONG TERM GOAL #5   Title  Patient to report tolerance of dressing and grooming activities without pain limiting.    Time  6    Period  Weeks    Status  On-going            Plan - 06/25/19 1406    Clinical Impression Statement  Kiva reporting isometric shoulder ER, abduction HEP exercises updated last visit has given her some discomfort however feels this is short-lasting and denies need for alteration to HEP.  Continued isometrics to check for proper technique and positioning with pt. able to demo good overall understanding.  Pt. R shoulder PROM visible improved today with PROM and progressing well per protocol.  Pt. request ice/compression machine to R shoulder to end visit as to reduce post-exercise swelling and pain.    Personal Factors and Comorbidities  Age;Comorbidity 3+;Time since onset of injury/illness/exacerbation;Past/Current Experience;Profession    Comorbidities  migraine, HTN, concussion, GERD,  anxiety    Rehab Potential  Good    PT Treatment/Interventions  ADLs/Self Care Home Management;Cryotherapy;Electrical Stimulation;Iontophoresis 4mg /ml Dexamethasone;Moist Heat;Therapeutic  exercise;Therapeutic activities;Functional mobility training;Ultrasound;Neuromuscular re-education;Patient/family education;Manual techniques;Vasopneumatic Device;Taping;Energy conservation;Dry needling;Passive range of motion;Scar mobilization    PT Next Visit Plan  progress per protocol    Consulted and Agree with Plan of Care  Patient       Patient will benefit from skilled therapeutic intervention in order to improve the following deficits and impairments:  Hypomobility, Increased edema, Decreased scar mobility, Decreased activity tolerance, Decreased strength, Impaired UE functional use, Pain, Improper body mechanics, Decreased range of motion, Impaired flexibility, Postural dysfunction  Visit Diagnosis: Acute pain of right shoulder  Stiffness of right shoulder, not elsewhere classified  Muscle weakness (generalized)  Other symptoms and signs involving the musculoskeletal system     Problem List Patient Active Problem List   Diagnosis Date Noted  . S/P arthroscopy of right shoulder 04/17/2018    Bess Harvest, PTA 06/25/19 6:17 PM   [facility] [address]  [place], [place], [address] Phone: [phone]   Fax:  [phone]  Name: LORETA ROSI MRN: [id number] Date of Birth: [birth year]

## 2019-06-26 ENCOUNTER — Ambulatory Visit: Payer: PRIVATE HEALTH INSURANCE | Admitting: Physical Therapy

## 2019-06-26 MED FILL — PREMARIN 0.625 MG TABLET: 0.625 | 30 days supply | Qty: 30 | Fill #0

## 2019-06-27 ENCOUNTER — Encounter: Payer: Self-pay | Admitting: Physical Therapy

## 2019-06-28 ENCOUNTER — Ambulatory Visit: Payer: PRIVATE HEALTH INSURANCE

## 2019-06-28 ENCOUNTER — Other Ambulatory Visit: Payer: Self-pay

## 2019-06-28 DIAGNOSIS — M6281 Muscle weakness (generalized): Secondary | ICD-10-CM

## 2019-06-28 DIAGNOSIS — M25511 Pain in right shoulder: Secondary | ICD-10-CM | POA: Diagnosis not present

## 2019-06-28 DIAGNOSIS — R29898 Other symptoms and signs involving the musculoskeletal system: Secondary | ICD-10-CM

## 2019-06-28 DIAGNOSIS — M25611 Stiffness of right shoulder, not elsewhere classified: Secondary | ICD-10-CM

## 2019-06-28 NOTE — Therapy (Signed)
[facility] [place], [place], [address] Phone: [phone]   Fax:  [phone]  Physical Therapy Treatment  Patient Details  Name: Monica Petty MRN: [id number] Date of Birth: May 13, 1966 Referring Provider (PT): Sydnee Cabal, MD   Encounter Date: 06/28/2019  PT End of Session - 06/28/19 1019    Visit Number  4    Number of Visits  13    Date for PT Re-Evaluation  07/25/19    Authorization Type  WC- 1 eval + 12 tx    Authorization - Visit Number  4    Authorization - Number of Visits  13    PT Start Time  [id number]    PT Stop Time  1113    PT Time Calculation (min)  59 min    Equipment Utilized During Treatment  --   R shoulder abduction sling   Activity Tolerance  Patient tolerated treatment well    Behavior During Therapy  WFL for tasks assessed/performed       Past Medical History:  Diagnosis Date  . Anxiety   . Diverticulosis of colon   . GERD (gastroesophageal reflux disease)   . History of colon polyps   . History of concussion    1987  fell of horse--- no residual  . Hypertension   . Migraine    occasional  . Rotator cuff tear    recurrent right side    Past Surgical History:  Procedure Laterality Date  . COLONOSCOPY  06/06/2016  . LAPAROSCOPIC CHOLECYSTECTOMY  06-03-2008   dr Ulyess Blossom  [facility]  . REDUCTION MAMMAPLASTY Bilateral 03-05-2002   dr barber  [facility]  . SHOULDER ARTHROSCOPY WITH ROTATOR CUFF REPAIR Right 05/23/2019   Procedure: SHOULDER ARTHROSCOPY WITH revision rotator cuff repair and  debirdement;  Surgeon: Sydnee Cabal, MD;  Location: [facility];  Service: Orthopedics;  Laterality: Right;  interscalene block  . SHOULDER ARTHROSCOPY WITH ROTATOR CUFF REPAIR AND SUBACROMIAL DECOMPRESSION Right 04/17/2018   Procedure: Right shoulder evaluation under anesthesia, scope, debridement, subacromial decompression, rotator cuff repair;  Surgeon: Sydnee Cabal, MD;   Location: [facility];  Service: Orthopedics;  Laterality: Right;  120 mins General with Intra Scalene Block  . VAGINAL HYSTERECTOMY  02-12-2002   dr Nori Riis  [facility]  . WISDOM TOOTH EXTRACTION  teen    There were no vitals filed for this visit.  Subjective Assessment - 06/28/19 1018    Subjective  Pt. reporting her Covid-19 which she took on Monday was negative.    Pertinent History  migraine, HTN, concussion, GERD, anxiety    Diagnostic tests  none recent    Patient Stated Goals  get back to 100%    Currently in Pain?  No/denies    Pain Score  0-No pain    Multiple Pain Sites  No         [facility] PT Assessment - 06/28/19 0001      PROM   PROM Assessment Site  Shoulder    Right/Left Shoulder  Right    Right Shoulder Flexion  133 Degrees   pain free    Right Shoulder ABduction  110 Degrees   pain free   Right Shoulder Internal Rotation  33 Degrees   pain free   Right Shoulder External Rotation  19 Degrees   pain free                  [facility] Adult PT Treatment/Exercise -  06/28/19 0001      Shoulder Exercises: Supine   External Rotation  Right;AAROM;15 reps    External Rotation Limitations  AAROM wand; elbow elevated on pillow by side     Flexion  Right;AAROM;10 reps   well tolerated    Flexion Limitations  AAROM wand - in painfree ROM; good control       Shoulder Exercises: Seated   Retraction  Both;15 reps    Retraction Limitations  5" hold       Shoulder Exercises: Prone   Retraction  Right;10 reps;Strengthening    Retraction Limitations  avoiding shoulder hyperextension    leaning over table   Extension  Right;10 reps;Strengthening    Extension Limitations  Avoiding shld hyperextension    leaning over table     Shoulder Exercises: Standing   Other Standing Exercises  R shoulder pendulums CW, CCW, R/L, front/back x 30 sec eac       Shoulder Exercises: Isometric Strengthening   Flexion Limitations  5" x 10 reps    Extension Limitations  5" x  10 reps     External Rotation Limitations  5" x 10 reps    Internal Rotation Limitations  5" x 10 reps    ABduction Limitations  5" x 10 reps      Vasopneumatic   Number Minutes Vasopneumatic   10 minutes    Vasopnuematic Location   Shoulder    Vasopneumatic Pressure  Low    Vasopneumatic Temperature   Coldest      Manual Therapy   Manual Therapy  Passive ROM    Passive ROM  R shoulder passive range of motion per protocol allowance                PT Short Term Goals - 06/28/19 1045      PT SHORT TERM GOAL #1   Title  Patient to be independent with initial HEP.    Time  3    Period  Weeks    Status  Achieved    Target Date  07/04/19        PT Long Term Goals - 06/18/19 1543      PT LONG TERM GOAL #1   Title  patient to be independent with advanced HEP    Time  6    Period  Weeks    Status  On-going      PT LONG TERM GOAL #2   Title  Patient to demonstrate R shoulder AROM/PROM WFL and without pain limiting.    Time  6    Period  Weeks    Status  On-going      PT LONG TERM GOAL #3   Title  patient to improve R shoulder strength to >/= 4+/5 without pain    Time  6    Period  Weeks    Status  On-going      PT LONG TERM GOAL #4   Title  Patient to demonstrate overhead reaching to place 5# object on shelf overhead with <=1/10 pain.    Time  6    Period  Weeks    Status  On-going      PT LONG TERM GOAL #5   Title  Patient to report tolerance of dressing and grooming activities without pain limiting.    Time  6    Period  Weeks    Status  On-going            Plan - 06/28/19 1019  Clinical Impression Statement  Pt. doing well today.  Reports her Covid-19 test was negative.  Tolerated mild progression of scapular work to prone extension, prone row, and supine AAROM gentle wand flexion well without increased pain.  Able to demonstrate Improved PROM flexion and IR today (see flow sheet) and improving ability to consciously reduce muscular guarding  with PROM.  Pt.  reports she continues to tolerate wand AAROM ER and R shoulder Isometrics at home well without increased pain.  Ended visit with ice/compression to R shoulder to reduce post-exercise swelling and pain.    Personal Factors and Comorbidities  Age;Comorbidity 3+;Time since onset of injury/illness/exacerbation;Past/Current Experience;Profession    Comorbidities  migraine, HTN, concussion, GERD, anxiety    Rehab Potential  Good    PT Treatment/Interventions  ADLs/Self Care Home Management;Cryotherapy;Electrical Stimulation;Iontophoresis 4mg /ml Dexamethasone;Moist Heat;Therapeutic exercise;Therapeutic activities;Functional mobility training;Ultrasound;Neuromuscular re-education;Patient/family education;Manual techniques;Vasopneumatic Device;Taping;Energy conservation;Dry needling;Passive range of motion;Scar mobilization    PT Next Visit Plan  progress per protocol    Consulted and Agree with Plan of Care  Patient       Patient will benefit from skilled therapeutic intervention in order to improve the following deficits and impairments:  Hypomobility, Increased edema, Decreased scar mobility, Decreased activity tolerance, Decreased strength, Impaired UE functional use, Pain, Improper body mechanics, Decreased range of motion, Impaired flexibility, Postural dysfunction  Visit Diagnosis: Acute pain of right shoulder  Stiffness of right shoulder, not elsewhere classified  Muscle weakness (generalized)  Other symptoms and signs involving the musculoskeletal system     Problem List Patient Active Problem List   Diagnosis Date Noted  . S/P arthroscopy of right shoulder 04/17/2018    Bess Harvest, PTA 06/28/19 12:18 PM   [facility] [address]  [place], [place], [address] Phone: [phone]   Fax:  [phone]  Name: Monica Petty MRN: [id number] Date of Birth: 08-May-1966

## 2019-07-02 ENCOUNTER — Ambulatory Visit: Payer: PRIVATE HEALTH INSURANCE

## 2019-07-02 ENCOUNTER — Other Ambulatory Visit: Payer: Self-pay

## 2019-07-02 DIAGNOSIS — M6281 Muscle weakness (generalized): Secondary | ICD-10-CM

## 2019-07-02 DIAGNOSIS — M25511 Pain in right shoulder: Secondary | ICD-10-CM | POA: Diagnosis not present

## 2019-07-02 DIAGNOSIS — M25611 Stiffness of right shoulder, not elsewhere classified: Secondary | ICD-10-CM

## 2019-07-02 NOTE — Therapy (Signed)
[facility] [place], [place], [address] Phone: [phone]   Fax:  [phone]  Physical Therapy Treatment  Patient Details  Name: Monica Petty MRN: [id number] Date of Birth: 06/16/1966 Referring Provider (PT): Sydnee Cabal, MD   Encounter Date: 07/02/2019  PT End of Session - 07/02/19 1407    Visit Number  5    Number of Visits  13    Date for PT Re-Evaluation  07/25/19    Authorization Type  WC- 1 eval + 12 tx    Authorization - Visit Number  5    Authorization - Number of Visits  13    PT Start Time  [id number]    PT Stop Time  1455    PT Time Calculation (min)  57 min    Equipment Utilized During Treatment  --   R shoulder abduction sling   Activity Tolerance  Patient tolerated treatment well    Behavior During Therapy  WFL for tasks assessed/performed       Past Medical History:  Diagnosis Date  . Anxiety   . Diverticulosis of colon   . GERD (gastroesophageal reflux disease)   . History of colon polyps   . History of concussion    1987  fell of horse--- no residual  . Hypertension   . Migraine    occasional  . Rotator cuff tear    recurrent right side    Past Surgical History:  Procedure Laterality Date  . COLONOSCOPY  06/06/2016  . LAPAROSCOPIC CHOLECYSTECTOMY  06-03-2008   dr Ulyess Blossom  [facility]  . REDUCTION MAMMAPLASTY Bilateral 03-05-2002   dr barber  [facility]  . SHOULDER ARTHROSCOPY WITH ROTATOR CUFF REPAIR Right 05/23/2019   Procedure: SHOULDER ARTHROSCOPY WITH revision rotator cuff repair and  debirdement;  Surgeon: Sydnee Cabal, MD;  Location: [facility];  Service: Orthopedics;  Laterality: Right;  interscalene block  . SHOULDER ARTHROSCOPY WITH ROTATOR CUFF REPAIR AND SUBACROMIAL DECOMPRESSION Right 04/17/2018   Procedure: Right shoulder evaluation under anesthesia, scope, debridement, subacromial decompression, rotator cuff repair;  Surgeon: Sydnee Cabal, MD;   Location: [facility];  Service: Orthopedics;  Laterality: Right;  120 mins General with Intra Scalene Block  . VAGINAL HYSTERECTOMY  02-12-2002   dr Nori Riis  [facility]  . WISDOM TOOTH EXTRACTION  teen    There were no vitals filed for this visit.  Subjective Assessment - 07/02/19 1403    Subjective  Pt. doing well today with no new complaints.    Pertinent History  migraine, HTN, concussion, GERD, anxiety    Diagnostic tests  none recent    Patient Stated Goals  get back to 100%    Currently in Pain?  No/denies    Pain Score  0-No pain    Multiple Pain Sites  No                       [facility] Adult PT Treatment/Exercise - 07/02/19 0001      Elbow Exercises   Elbow Flexion  Right;10 reps    Theraband Level (Elbow Flexion)  Level 1 (Yellow)    Elbow Flexion Limitations  elbow by side + bolsteR    Elbow Extension  Right;10 reps;Theraband    Theraband Level (Elbow Extension)  Level 1 (Yellow)    Elbow Extension Limitations  elbow by side + bolster       Shoulder Exercises: Supine   External Rotation  Right;AAROM;15 reps    External Rotation Limitations  AAROM wand; elbow elevated on pillow by side     Flexion  Right;AAROM;15 reps    Flexion Limitations  AAROM wand - in painfree ROM; good control       Shoulder Exercises: Prone   Retraction  Right;10 reps;Strengthening    Retraction Limitations  avoiding shoulder hyperextension    leaning over table    Extension  Right;10 reps;Strengthening   leaning over table    Extension Limitations  Avoiding shld hyperextension       Shoulder Exercises: Standing   Other Standing Exercises  R shoulder pendulums CW, CCW, R/L, front/back x 30 sec eac       Shoulder Exercises: Pulleys   Flexion  5 minutes   difficulty relaxing R shld musculature - improved later    Flexion Limitations  increasesed height of       Vasopneumatic   Number Minutes Vasopneumatic   10 minutes    Vasopnuematic Location   Shoulder     Vasopneumatic Pressure  Low    Vasopneumatic Temperature   Coldest      Manual Therapy   Manual Therapy  Passive ROM    Passive ROM  R shoulder passive range of motion per protocol focusing on ER, abduction     ER improved to 35dg as further elevation into scapular plane            PT Education - 07/02/19 1600    Education Details  HEP update; supine wand AAROM flexion, prone row, prone extension (both avoiding shoulder hyperextension    Person(s) Educated  Patient    Methods  Explanation;Demonstration;Verbal cues;Handout    Comprehension  Verbalized understanding;Returned demonstration;Verbal cues required       PT Short Term Goals - 06/28/19 1045      PT SHORT TERM GOAL #1   Title  Patient to be independent with initial HEP.    Time  3    Period  Weeks    Status  Achieved    Target Date  07/04/19        PT Long Term Goals - 06/18/19 1543      PT LONG TERM GOAL #1   Title  patient to be independent with advanced HEP    Time  6    Period  Weeks    Status  On-going      PT LONG TERM GOAL #2   Title  Patient to demonstrate R shoulder AROM/PROM WFL and without pain limiting.    Time  6    Period  Weeks    Status  On-going      PT LONG TERM GOAL #3   Title  patient to improve R shoulder strength to >/= 4+/5 without pain    Time  6    Period  Weeks    Status  On-going      PT LONG TERM GOAL #4   Title  Patient to demonstrate overhead reaching to place 5# object on shelf overhead with <=1/10 pain.    Time  6    Period  Weeks    Status  On-going      PT LONG TERM GOAL #5   Title  Patient to report tolerance of dressing and grooming activities without pain limiting.    Time  6    Period  Weeks    Status  On-going            Plan - 07/02/19 1413  Clinical Impression Statement  Monica Petty doing well today.  Reports MD wanted her to wear sling when out in public for two more weeks at recent f/u and kept her out of work for another 6 weeks.  Tolerated  progression to seated flexion pulleys AAROM today well with reduced muscular guarding as activity progressed.  pt. pain free throughout session today only with occasional complains of tightness.  Progressed prone rowing and added yellow TB biceps and triceps strengthening per protocol without issue.  Pt. PROM ER progressed to ~ 35 dg with therapist prolonged holds and increased elevation into scapular plane positioning.  Pt. encouraged to continue focusing on improving her AAROM ER, flexion motions with wand supine AAROM activities at home.  HEP updated.  Ended visit with ice/compression to R shoulder to reduce post-exercise swelling and pain.    Personal Factors and Comorbidities  Age;Comorbidity 3+;Time since onset of injury/illness/exacerbation;Past/Current Experience;Profession    Comorbidities  migraine, HTN, concussion, GERD, anxiety    Rehab Potential  Good    PT Treatment/Interventions  ADLs/Self Care Home Management;Cryotherapy;Electrical Stimulation;Iontophoresis 4mg /ml Dexamethasone;Moist Heat;Therapeutic exercise;Therapeutic activities;Functional mobility training;Ultrasound;Neuromuscular re-education;Patient/family education;Manual techniques;Vasopneumatic Device;Taping;Energy conservation;Dry needling;Passive range of motion;Scar mobilization    PT Next Visit Plan  progress per protocol    Consulted and Agree with Plan of Care  Patient       Patient will benefit from skilled therapeutic intervention in order to improve the following deficits and impairments:  Hypomobility, Increased edema, Decreased scar mobility, Decreased activity tolerance, Decreased strength, Impaired UE functional use, Pain, Improper body mechanics, Decreased range of motion, Impaired flexibility, Postural dysfunction  Visit Diagnosis: Acute pain of right shoulder  Stiffness of right shoulder, not elsewhere classified  Muscle weakness (generalized)     Problem List Patient Active Problem List   Diagnosis  Date Noted  . S/P arthroscopy of right shoulder 04/17/2018    Bess Harvest, PTA 07/02/19 4:01 PM   [facility] [address]  [place], [place], [address] Phone: [phone]   Fax:  [phone]  Name: Monica Petty MRN: [id number] Date of Birth: 1965-09-24

## 2019-07-04 ENCOUNTER — Other Ambulatory Visit: Payer: Self-pay

## 2019-07-04 ENCOUNTER — Ambulatory Visit: Payer: PRIVATE HEALTH INSURANCE | Admitting: Physical Therapy

## 2019-07-04 ENCOUNTER — Encounter: Payer: Self-pay | Admitting: Physical Therapy

## 2019-07-04 DIAGNOSIS — M25611 Stiffness of right shoulder, not elsewhere classified: Secondary | ICD-10-CM

## 2019-07-04 DIAGNOSIS — M25511 Pain in right shoulder: Secondary | ICD-10-CM

## 2019-07-04 DIAGNOSIS — R29898 Other symptoms and signs involving the musculoskeletal system: Secondary | ICD-10-CM

## 2019-07-04 DIAGNOSIS — M6281 Muscle weakness (generalized): Secondary | ICD-10-CM

## 2019-07-04 NOTE — Therapy (Signed)
[facility] [place], [place], [address] Phone: [phone]   Fax:  [phone]  Physical Therapy Treatment  Patient Details  Name: Monica Petty MRN: [id number] Date of Birth: 1965/10/18 Referring Provider (PT): Sydnee Cabal, MD   Encounter Date: 07/04/2019  PT End of Session - 07/04/19 1150    Visit Number  6    Number of Visits  13    Date for PT Re-Evaluation  07/25/19    Authorization Type  Approved for as many visits as necessary until 09/18/19    PT Start Time  1101    PT Stop Time  1201    PT Time Calculation (min)  60 min    Equipment Utilized During Treatment  --   R shoulder abduction sling   Activity Tolerance  Patient tolerated treatment well;Patient limited by pain    Behavior During Therapy  [facility] for tasks assessed/performed       Past Medical History:  Diagnosis Date  . Anxiety   . Diverticulosis of colon   . GERD (gastroesophageal reflux disease)   . History of colon polyps   . History of concussion    1987  fell of horse--- no residual  . Hypertension   . Migraine    occasional  . Rotator cuff tear    recurrent right side    Past Surgical History:  Procedure Laterality Date  . COLONOSCOPY  06/06/2016  . LAPAROSCOPIC CHOLECYSTECTOMY  06-03-2008   dr Ulyess Blossom  [facility]  . REDUCTION MAMMAPLASTY Bilateral 03-05-2002   dr barber  [facility]  . SHOULDER ARTHROSCOPY WITH ROTATOR CUFF REPAIR Right 05/23/2019   Procedure: SHOULDER ARTHROSCOPY WITH revision rotator cuff repair and  debirdement;  Surgeon: Sydnee Cabal, MD;  Location: [facility];  Service: Orthopedics;  Laterality: Right;  interscalene block  . SHOULDER ARTHROSCOPY WITH ROTATOR CUFF REPAIR AND SUBACROMIAL DECOMPRESSION Right 04/17/2018   Procedure: Right shoulder evaluation under anesthesia, scope, debridement, subacromial decompression, rotator cuff repair;  Surgeon: Sydnee Cabal, MD;  Location: [facility];  Service: Orthopedics;  Laterality: Right;  120 mins General with Intra Scalene Block  . VAGINAL HYSTERECTOMY  02-12-2002   dr Nori Riis  [facility]  . WISDOM TOOTH EXTRACTION  teen    There were no vitals filed for this visit.  Subjective Assessment - 07/04/19 1055    Subjective  Doing okay. Mother is coming to visit today.    Pertinent History  migraine, HTN, concussion, GERD, anxiety    Diagnostic tests  none recent    Patient Stated Goals  get back to 100%    Currently in Pain?  No/denies                       [facility] Adult PT Treatment/Exercise - 07/04/19 0001      Exercises   Exercises  Shoulder      Shoulder Exercises: Supine   External Rotation  Right;AAROM;5 reps    External Rotation Limitations  AAROM wand; elbow elevated on pillow by side     Flexion  Right;AAROM;5 reps    Flexion Limitations  AAROM wand to tolerance    ABduction  AAROM;Right;5 reps    ABduction Limitations  AAROM abduction/scaption to tolerance with pillow under R arm    Other Supine Exercises  R shoulder flexion AROM concentric, PROM eccentric with pillow under R arm to tolerance x5   good control  Shoulder Exercises: Sidelying   External Rotation  AROM;Right;10 reps    External Rotation Limitations  elbow at side and stopping at neutral   c/o tingling in digits 4-5   ABduction  AROM;Right;5 reps    ABduction Limitations  AROM concentric, PROM eccentric with pillow resting on torso; thumb up; to tolerance      Shoulder Exercises: Standing   Other Standing Exercises  R shoulder flexion wall ladder x5   cues to relax R shoulder down     Shoulder Exercises: Pulleys   Flexion  3 minutes    Flexion Limitations  to tolerance    Scaption  3 minutes    Scaption Limitations  to tolerance      Vasopneumatic   Number Minutes Vasopneumatic   15 minutes    Vasopnuematic Location   Shoulder   R   Vasopneumatic Pressure  Low    Vasopneumatic Temperature   Coldest       Manual Therapy   Manual Therapy  Passive ROM    Manual therapy comments  supine    Passive ROM  R shoulder PROM flexion, abduction/scaption, IR, ER to tolerance             PT Education - 07/04/19 1149    Education Details  update to HEP    Person(s) Educated  Patient    Methods  Explanation;Demonstration;Tactile cues;Verbal cues;Handout    Comprehension  Verbalized understanding;Returned demonstration       PT Short Term Goals - 06/28/19 1045      PT SHORT TERM GOAL #1   Title  Patient to be independent with initial HEP.    Time  3    Period  Weeks    Status  Achieved    Target Date  07/04/19        PT Long Term Goals - 06/18/19 1543      PT LONG TERM GOAL #1   Title  patient to be independent with advanced HEP    Time  6    Period  Weeks    Status  On-going      PT LONG TERM GOAL #2   Title  Patient to demonstrate R shoulder AROM/PROM WFL and without pain limiting.    Time  6    Period  Weeks    Status  On-going      PT LONG TERM GOAL #3   Title  patient to improve R shoulder strength to >/= 4+/5 without pain    Time  6    Period  Weeks    Status  On-going      PT LONG TERM GOAL #4   Title  Patient to demonstrate overhead reaching to place 5# object on shelf overhead with <=1/10 pain.    Time  6    Period  Weeks    Status  On-going      PT LONG TERM GOAL #5   Title  Patient to report tolerance of dressing and grooming activities without pain limiting.    Time  6    Period  Weeks    Status  On-going            Plan - 07/04/19 1152    Clinical Impression Statement  Patient remarking on improved ROM with pulley warm-up at beginning of session. Worked on PROM in all directions within patient's tolerance.  Patient able to perform AAROM in supine with good form and control. Thus, introduced supine flexion and sidelying abduction AROM  during concentric phase of movement, with assistance during passive eccentric phase. Patient with c/o slight  "burning" in shoulder musculature with increased reps but otherwise pain-free. Ended session with Gameready to R shoulder for post-exercise soreness. No complaints at end of session. Patient progressing well per POC.    Comorbidities  migraine, HTN, concussion, GERD, anxiety    Rehab Potential  Good    PT Treatment/Interventions  ADLs/Self Care Home Management;Cryotherapy;Electrical Stimulation;Iontophoresis 4mg /ml Dexamethasone;Moist Heat;Therapeutic exercise;Therapeutic activities;Functional mobility training;Ultrasound;Neuromuscular re-education;Patient/family education;Manual techniques;Vasopneumatic Device;Taping;Energy conservation;Dry needling;Passive range of motion;Scar mobilization    PT Next Visit Plan  progress per protocol- completed week 5 on 07/04/19    Consulted and Agree with Plan of Care  Patient       Patient will benefit from skilled therapeutic intervention in order to improve the following deficits and impairments:  Hypomobility, Increased edema, Decreased scar mobility, Decreased activity tolerance, Decreased strength, Impaired UE functional use, Pain, Improper body mechanics, Decreased range of motion, Impaired flexibility, Postural dysfunction  Visit Diagnosis: Acute pain of right shoulder  Stiffness of right shoulder, not elsewhere classified  Muscle weakness (generalized)  Other symptoms and signs involving the musculoskeletal system     Problem List Patient Active Problem List   Diagnosis Date Noted  . S/P arthroscopy of right shoulder 04/17/2018     Janene Harvey, PT, DPT 07/04/19 12:05 PM   [facility] [address]  [place], [place], [address] Phone: [phone]   Fax:  [phone]  Name: Monica Petty MRN: [id number] Date of Birth: 01-Dec-1965

## 2019-07-09 ENCOUNTER — Ambulatory Visit: Payer: PRIVATE HEALTH INSURANCE | Admitting: Physical Therapy

## 2019-07-09 ENCOUNTER — Encounter: Payer: Self-pay | Admitting: Physical Therapy

## 2019-07-09 ENCOUNTER — Other Ambulatory Visit: Payer: Self-pay

## 2019-07-09 DIAGNOSIS — M25511 Pain in right shoulder: Secondary | ICD-10-CM

## 2019-07-09 DIAGNOSIS — M25611 Stiffness of right shoulder, not elsewhere classified: Secondary | ICD-10-CM

## 2019-07-09 DIAGNOSIS — M6281 Muscle weakness (generalized): Secondary | ICD-10-CM

## 2019-07-09 DIAGNOSIS — R29898 Other symptoms and signs involving the musculoskeletal system: Secondary | ICD-10-CM

## 2019-07-09 NOTE — Therapy (Signed)
[facility] [place], [place], [address] Phone: [phone]   Fax:  [phone]  Physical Therapy Treatment  Patient Details  Name: Monica Petty MRN: [id number] Date of Birth: 1966-01-04 Referring Provider (PT): Sydnee Cabal, MD   Encounter Date: 07/09/2019  PT End of Session - 07/09/19 1618    Visit Number  7    Number of Visits  13    Date for PT Re-Evaluation  07/25/19    Authorization Type  Approved for as many visits as necessary until 09/18/19    PT Start Time  1400    PT Stop Time  1457    PT Time Calculation (min)  57 min    Equipment Utilized During Treatment  --   R shoulder abduction sling   Activity Tolerance  Patient tolerated treatment well;Patient limited by pain    Behavior During Therapy  [facility] for tasks assessed/performed       Past Medical History:  Diagnosis Date  . Anxiety   . Diverticulosis of colon   . GERD (gastroesophageal reflux disease)   . History of colon polyps   . History of concussion    1987  fell of horse--- no residual  . Hypertension   . Migraine    occasional  . Rotator cuff tear    recurrent right side    Past Surgical History:  Procedure Laterality Date  . COLONOSCOPY  06/06/2016  . LAPAROSCOPIC CHOLECYSTECTOMY  06-03-2008   dr Ulyess Blossom  [facility]  . REDUCTION MAMMAPLASTY Bilateral 03-05-2002   dr barber  [facility]  . SHOULDER ARTHROSCOPY WITH ROTATOR CUFF REPAIR Right 05/23/2019   Procedure: SHOULDER ARTHROSCOPY WITH revision rotator cuff repair and  debirdement;  Surgeon: Sydnee Cabal, MD;  Location: [facility];  Service: Orthopedics;  Laterality: Right;  interscalene block  . SHOULDER ARTHROSCOPY WITH ROTATOR CUFF REPAIR AND SUBACROMIAL DECOMPRESSION Right 04/17/2018   Procedure: Right shoulder evaluation under anesthesia, scope, debridement, subacromial decompression, rotator cuff repair;  Surgeon: Sydnee Cabal, MD;  Location: [facility];  Service: Orthopedics;  Laterality: Right;  120 mins General with Intra Scalene Block  . VAGINAL HYSTERECTOMY  02-12-2002   dr Nori Riis  [facility]  . WISDOM TOOTH EXTRACTION  teen    There were no vitals filed for this visit.  Subjective Assessment - 07/09/19 1401    Subjective  Not much is new since last session.    Pertinent History  migraine, HTN, concussion, GERD, anxiety    Diagnostic tests  none recent    Patient Stated Goals  get back to 100%    Currently in Pain?  No/denies                       [facility] Adult PT Treatment/Exercise - 07/09/19 0001      Shoulder Exercises: Supine   Protraction  Both;AROM;10 reps    Protraction Limitations  serratus punch    Flexion  AROM;Right;5 reps    Flexion Limitations  R shoulder AROM with PROM on eccentric phase x5, AROM during concerntric and eccentric phase with 2 pillows under R UE x5      Shoulder Exercises: Sidelying   External Rotation  AROM;Right;10 reps    External Rotation Limitations  2x10; cues to maintain elbow at side   c/o tingling in fingers with increased reps   ABduction  AROM;Right;5 reps    ABduction Limitations  R shoulder AROM with  PROM on eccentric phase x5, AROM during concerntric and eccentric phase with 1 pillow on torso x5      Shoulder Exercises: Pulleys   Flexion  3 minutes    Flexion Limitations  to tolerance    Scaption  3 minutes    Scaption Limitations  to tolerance      Vasopneumatic   Number Minutes Vasopneumatic   15 minutes    Vasopnuematic Location   Shoulder   R   Vasopneumatic Pressure  Low    Vasopneumatic Temperature   Coldest      Manual Therapy   Manual Therapy  Passive ROM    Passive ROM  R shoulder PROM flexion, abduction/scaption, IR, ER to tolerance   intermittent popping in R shoulder at mid ranges of ABD&ER              PT Short Term Goals - 06/28/19 1045      PT SHORT TERM GOAL #1   Title  Patient to be independent with initial HEP.     Time  3    Period  Weeks    Status  Achieved    Target Date  07/04/19        PT Long Term Goals - 06/18/19 1543      PT LONG TERM GOAL #1   Title  patient to be independent with advanced HEP    Time  6    Period  Weeks    Status  On-going      PT LONG TERM GOAL #2   Title  Patient to demonstrate R shoulder AROM/PROM WFL and without pain limiting.    Time  6    Period  Weeks    Status  On-going      PT LONG TERM GOAL #3   Title  patient to improve R shoulder strength to >/= 4+/5 without pain    Time  6    Period  Weeks    Status  On-going      PT LONG TERM GOAL #4   Title  Patient to demonstrate overhead reaching to place 5# object on shelf overhead with <=1/10 pain.    Time  6    Period  Weeks    Status  On-going      PT LONG TERM GOAL #5   Title  Patient to report tolerance of dressing and grooming activities without pain limiting.    Time  6    Period  Weeks    Status  On-going            Plan - 07/09/19 1436    Clinical Impression Statement  Patient without new complaints at beginning of session. Tolerated R shoulder PROIM in all planes, with report of intermittent pop in R shoulder at mid ranges of abduction and ER. Patient denied lasting pain. Worked on supine AROM flexion and sidelying abduction with improved ability to control eccentric lowering. Reported tingling in hand and fingers with increasing reps of sidelying ER, which dissipated after exercise. Ended session with Gameready to R shoulder for post-exercise soreness. No complaints at end of session.    Comorbidities  migraine, HTN, concussion, GERD, anxiety    Rehab Potential  Good    PT Treatment/Interventions  ADLs/Self Care Home Management;Cryotherapy;Electrical Stimulation;Iontophoresis 4mg /ml Dexamethasone;Moist Heat;Therapeutic exercise;Therapeutic activities;Functional mobility training;Ultrasound;Neuromuscular re-education;Patient/family education;Manual techniques;Vasopneumatic  Device;Taping;Energy conservation;Dry needling;Passive range of motion;Scar mobilization    PT Next Visit Plan  progress per protocol- completed week 5 on 07/04/19; add in supine  flexion AROM and sideling abduction AROM into HEP if well-tolerated after this session    Consulted and Agree with Plan of Care  Patient       Patient will benefit from skilled therapeutic intervention in order to improve the following deficits and impairments:  Hypomobility, Increased edema, Decreased scar mobility, Decreased activity tolerance, Decreased strength, Impaired UE functional use, Pain, Improper body mechanics, Decreased range of motion, Impaired flexibility, Postural dysfunction  Visit Diagnosis: Acute pain of right shoulder  Stiffness of right shoulder, not elsewhere classified  Muscle weakness (generalized)  Other symptoms and signs involving the musculoskeletal system     Problem List Patient Active Problem List   Diagnosis Date Noted  . S/P arthroscopy of right shoulder 04/17/2018     Janene Harvey, PT, DPT 07/09/19 4:19 PM   [facility] [address]  [place], [place], [address] Phone: [phone]   Fax:  [phone]  Name: Monica Petty MRN: [id number] Date of Birth: 1966/02/19

## 2019-07-11 ENCOUNTER — Ambulatory Visit: Payer: PRIVATE HEALTH INSURANCE

## 2019-07-11 ENCOUNTER — Other Ambulatory Visit: Payer: Self-pay

## 2019-07-11 DIAGNOSIS — M6281 Muscle weakness (generalized): Secondary | ICD-10-CM

## 2019-07-11 DIAGNOSIS — M25611 Stiffness of right shoulder, not elsewhere classified: Secondary | ICD-10-CM

## 2019-07-11 DIAGNOSIS — M25511 Pain in right shoulder: Secondary | ICD-10-CM | POA: Diagnosis not present

## 2019-07-11 DIAGNOSIS — R29898 Other symptoms and signs involving the musculoskeletal system: Secondary | ICD-10-CM

## 2019-07-11 NOTE — Therapy (Signed)
[facility] [address]  [place], [place], [address] Phone: [phone]   Fax:  [phone]  Physical Therapy Treatment  Patient Details  Name: Monica Petty MRN: [id number] Date of Birth: Aug 03, 1966 Referring Provider (PT): Sydnee Cabal, MD   Encounter Date: 07/11/2019  PT End of Session - 07/11/19 1410    Visit Number  8    Number of Visits  13    Date for PT Re-Evaluation  07/25/19    Authorization Type  Approved for as many visits as necessary until 09/18/19    Authorization - Visit Number  --    PT Start Time  1401    PT Stop Time  1503    PT Time Calculation (min)  62 min    Equipment Utilized During Treatment  --   R shoulder abduction sling   Activity Tolerance  Patient tolerated treatment well;Patient limited by pain    Behavior During Therapy  [facility] for tasks assessed/performed       Past Medical History:  Diagnosis Date  . Anxiety   . Diverticulosis of colon   . GERD (gastroesophageal reflux disease)   . History of colon polyps   . History of concussion    1987  fell of horse--- no residual  . Hypertension   . Migraine    occasional  . Rotator cuff tear    recurrent right side    Past Surgical History:  Procedure Laterality Date  . COLONOSCOPY  06/06/2016  . LAPAROSCOPIC CHOLECYSTECTOMY  06-03-2008   dr Ulyess Blossom  [facility]  . REDUCTION MAMMAPLASTY Bilateral 03-05-2002   dr barber  [facility]  . SHOULDER ARTHROSCOPY WITH ROTATOR CUFF REPAIR Right 05/23/2019   Procedure: SHOULDER ARTHROSCOPY WITH revision rotator cuff repair and  debirdement;  Surgeon: Sydnee Cabal, MD;  Location: [facility];  Service: Orthopedics;  Laterality: Right;  interscalene block  . SHOULDER ARTHROSCOPY WITH ROTATOR CUFF REPAIR AND SUBACROMIAL DECOMPRESSION Right 04/17/2018   Procedure: Right shoulder evaluation under anesthesia, scope, debridement, subacromial decompression, rotator cuff repair;  Surgeon:  Sydnee Cabal, MD;  [facility];  Service: Orthopedics;  Laterality: Right;  120 mins General with Intra Scalene Block  . VAGINAL HYSTERECTOMY  02-12-2002   dr Nori Riis  [facility]  . WISDOM TOOTH EXTRACTION  teen    There were no vitals filed for this visit.  Subjective Assessment - 07/11/19 1408    Subjective  Pt. doing well with no new complaints.    Pertinent History  migraine, HTN, concussion, GERD, anxiety    Diagnostic tests  none recent    Patient Stated Goals  get back to 100%    Currently in Pain?  No/denies    Pain Score  0-No pain   2-3/10 pain at worst after prolonged R UE "dangling" out of sling   Pain Location  Shoulder    Pain Orientation  Right    Pain Descriptors / Indicators  Aching    Pain Type  Acute pain;Surgical pain         [facility] PT Assessment - 07/11/19 0001      PROM   PROM Assessment Site  Shoulder    Right/Left Shoulder  Right    Right Shoulder Flexion  141 Degrees    Right Shoulder ABduction  126 Degrees    Right Shoulder Internal Rotation  73 Degrees   measured in scapular plane    Right Shoulder External Rotation  52 Degrees  measure in scapular plane                   [facility] Adult PT Treatment/Exercise - 07/11/19 0001      Shoulder Exercises: Supine   Protraction  Both;15 reps    Protraction Limitations  serratus punch    Flexion  AROM;10 reps    Flexion Limitations  R AROM flexion       Shoulder Exercises: Sidelying   External Rotation  AROM;Right;10 reps    ABduction  AROM;Right;10 reps      Shoulder Exercises: Pulleys   Flexion  3 minutes    Flexion Limitations  to tolerance    Scaption  3 minutes    Scaption Limitations  to tolerance      Vasopneumatic   Number Minutes Vasopneumatic   15 minutes    Vasopnuematic Location   Shoulder   R   Vasopneumatic Pressure  Low    Vasopneumatic Temperature   Coldest      Manual Therapy   Manual Therapy  Passive ROM;Soft tissue mobilization    Manual  therapy comments  supine    Soft tissue mobilization  STM to R supra, lateral deltoid, anterior deltoid, R infra    Passive ROM  R shoulder PROM flexion, abduction/scaption, IR, ER to tolerance             PT Education - 07/11/19 1459    Education Details  HEP update; AROM sidelying ER, sidelying abduction, supine protraction, supine flexion    Person(s) Educated  Patient    Methods  Explanation;Demonstration;Verbal cues;Handout    Comprehension  Verbalized understanding;Returned demonstration;Verbal cues required       PT Short Term Goals - 06/28/19 1045      PT SHORT TERM GOAL #1   Title  Patient to be independent with initial HEP.    Time  3    Period  Weeks    Status  Achieved    Target Date  07/04/19        PT Long Term Goals - 06/18/19 1543      PT LONG TERM GOAL #1   Title  patient to be independent with advanced HEP    Time  6    Period  Weeks    Status  On-going      PT LONG TERM GOAL #2   Title  Patient to demonstrate R shoulder AROM/PROM WFL and without pain limiting.    Time  6    Period  Weeks    Status  On-going      PT LONG TERM GOAL #3   Title  patient to improve R shoulder strength to >/= 4+/5 without pain    Time  6    Period  Weeks    Status  On-going      PT LONG TERM GOAL #4   Title  Patient to demonstrate overhead reaching to place 5# object on shelf overhead with <=1/10 pain.    Time  6    Period  Weeks    Status  On-going      PT LONG TERM GOAL #5   Title  Patient to report tolerance of dressing and grooming activities without pain limiting.    Time  6    Period  Weeks    Status  On-going            Plan - 07/11/19 1411    Clinical Impression Statement  Doing well today.  Reports MD ok  with her no longer wearing sling in public starting Monday.  Pt. demonstrating much improved R shoulder PROM in all planes (see flowsheet) most notable in ER, IR.  Able to tolerate mild progression in supine and sidelying AROM activities  with R shoulder without increased pain.  Updated HEP and encouraged pt. to continue to avoid pushing into pain with activities at home.  Pt. verbalized understanding.  Ended session with ice/compression to R shoulder per pt. request to reduce post-exercise soreness and swelling.  Pt. progress toward LTG #2.    Personal Factors and Comorbidities  Age;Comorbidity 3+;Time since onset of injury/illness/exacerbation;Past/Current Experience;Profession    Comorbidities  migraine, HTN, concussion, GERD, anxiety    Rehab Potential  Good    PT Treatment/Interventions  ADLs/Self Care Home Management;Cryotherapy;Electrical Stimulation;Iontophoresis 4mg /ml Dexamethasone;Moist Heat;Therapeutic exercise;Therapeutic activities;Functional mobility training;Ultrasound;Neuromuscular re-education;Patient/family education;Manual techniques;Vasopneumatic Device;Taping;Energy conservation;Dry needling;Passive range of motion;Scar mobilization    PT Next Visit Plan  progress per protocol- completed week 6 on 07/11/19    Consulted and Agree with Plan of Care  Patient       Patient will benefit from skilled therapeutic intervention in order to improve the following deficits and impairments:  Hypomobility, Increased edema, Decreased scar mobility, Decreased activity tolerance, Decreased strength, Impaired UE functional use, Pain, Improper body mechanics, Decreased range of motion, Impaired flexibility, Postural dysfunction  Visit Diagnosis: Acute pain of right shoulder  Stiffness of right shoulder, not elsewhere classified  Muscle weakness (generalized)  Other symptoms and signs involving the musculoskeletal system     Problem List Patient Active Problem List   Diagnosis Date Noted  . S/P arthroscopy of right shoulder 04/17/2018    Bess Harvest, PTA 07/11/19 6:22 PM   [facility] [address]  [place], [place], [address] Phone: [phone]    Fax:  [phone]  Name: Monica Petty MRN: [id number] Date of Birth: 08-31-1966

## 2019-07-16 ENCOUNTER — Ambulatory Visit: Payer: PRIVATE HEALTH INSURANCE

## 2019-07-16 ENCOUNTER — Other Ambulatory Visit: Payer: Self-pay

## 2019-07-16 DIAGNOSIS — R29898 Other symptoms and signs involving the musculoskeletal system: Secondary | ICD-10-CM

## 2019-07-16 DIAGNOSIS — M25511 Pain in right shoulder: Secondary | ICD-10-CM | POA: Diagnosis not present

## 2019-07-16 DIAGNOSIS — M6281 Muscle weakness (generalized): Secondary | ICD-10-CM

## 2019-07-16 DIAGNOSIS — M25611 Stiffness of right shoulder, not elsewhere classified: Secondary | ICD-10-CM

## 2019-07-16 NOTE — Therapy (Addendum)
[facility] [place], [place], [address] Phone: [phone]   Fax:  [phone]  Physical Therapy Treatment  Patient Details  Name: Monica Petty MRN: [id number] Date of Birth: 08-26-1966 Referring Provider (PT): Sydnee Cabal, MD   Encounter Date: 07/16/2019  PT End of Session - 07/16/19 1414    Visit Number  9    Number of Visits  13    Date for PT Re-Evaluation  07/25/19    Authorization Type  Approved for as many visits as necessary until 09/18/19    PT Start Time  1402    PT Stop Time  1455    PT Time Calculation (min)  53 min    Equipment Utilized During Treatment  --   R shoulder abduction sling   Activity Tolerance  Patient tolerated treatment well;Patient limited by pain    Behavior During Therapy  [facility] for tasks assessed/performed       Past Medical History:  Diagnosis Date  . Anxiety   . Diverticulosis of colon   . GERD (gastroesophageal reflux disease)   . History of colon polyps   . History of concussion    1987  fell of horse--- no residual  . Hypertension   . Migraine    occasional  . Rotator cuff tear    recurrent right side    Past Surgical History:  Procedure Laterality Date  . COLONOSCOPY  06/06/2016  . LAPAROSCOPIC CHOLECYSTECTOMY  06-03-2008   dr Ulyess Blossom  [facility]  . REDUCTION MAMMAPLASTY Bilateral 03-05-2002   dr barber  [facility]  . SHOULDER ARTHROSCOPY WITH ROTATOR CUFF REPAIR Right 05/23/2019   Procedure: SHOULDER ARTHROSCOPY WITH revision rotator cuff repair and  debirdement;  Surgeon: Sydnee Cabal, MD;  Location: [facility];  Service: Orthopedics;  Laterality: Right;  interscalene block  . SHOULDER ARTHROSCOPY WITH ROTATOR CUFF REPAIR AND SUBACROMIAL DECOMPRESSION Right 04/17/2018   Procedure: Right shoulder evaluation under anesthesia, scope, debridement, subacromial decompression, rotator cuff repair;  Surgeon: Sydnee Cabal, MD;  Location: [facility];  Service: Orthopedics;  Laterality: Right;  120 mins General with Intra Scalene Block  . VAGINAL HYSTERECTOMY  02-12-2002   dr Nori Riis  [facility]  . WISDOM TOOTH EXTRACTION  teen    There were no vitals filed for this visit.  Subjective Assessment - 07/16/19 1405    Subjective  Pt. reporting she has been free of the sling since Sunday.  Did have some R shoulder discomfort since "sleeping wrong".    Pertinent History  migraine, HTN, concussion, GERD, anxiety    Diagnostic tests  none recent    Patient Stated Goals  get back to 100%    Currently in Pain?  Yes    Pain Score  3     Pain Location  Shoulder    Pain Orientation  Right    Pain Descriptors / Indicators  Aching    Pain Type  Acute pain;Surgical pain    Multiple Pain Sites  No                       [facility] Adult PT Treatment/Exercise - 07/16/19 0001      Shoulder Exercises: Supine   Protraction  Right;15 reps    Protraction Weight (lbs)  1    Protraction Limitations  serratus punch    Flexion  AROM;10 reps    Shoulder Flexion Weight (lbs)  1  Other Supine Exercises  R shoulder circles 1# in 90 dg flexion positioning  x 10 CW, CCW       Shoulder Exercises: Prone   Retraction  Right;10 reps;Strengthening    Retraction Weight (lbs)  1    Retraction Limitations  avoiding shoulder hyperextension     Extension  Right;10 reps;Strengthening    Extension Weight (lbs)  1      Shoulder Exercises: Sidelying   External Rotation  AROM;Right;15 reps;12 reps    ABduction  12 reps;AROM;Strengthening      Shoulder Exercises: Pulleys   Flexion  3 minutes    Flexion Limitations  to tolerance    Scaption  3 minutes    Scaption Limitations  to tolerance      Shoulder Exercises: Body Blade   External Rotation  15 seconds;2 reps    External Rotation Limitations  elbow in neutral; 30 sec rest     Internal Rotation  15 seconds;2 reps    Internal Rotation Limitations  elbow in neutral       Therex:   Functional cone stacking at counter (with elbow below 45 dg elevation) 2 x 3 into flexion, 2 x 3 into scaption - well tolerated without increased pain  - performed slowly         PT Short Term Goals - 06/28/19 1045      PT SHORT TERM GOAL #1   Title  Patient to be independent with initial HEP.    Time  3    Period  Weeks    Status  Achieved    Target Date  07/04/19        PT Long Term Goals - 06/18/19 1543      PT LONG TERM GOAL #1   Title  patient to be independent with advanced HEP    Time  6    Period  Weeks    Status  On-going      PT LONG TERM GOAL #2   Title  Patient to demonstrate R shoulder AROM/PROM WFL and without pain limiting.    Time  6    Period  Weeks    Status  On-going      PT LONG TERM GOAL #3   Title  patient to improve R shoulder strength to >/= 4+/5 without pain    Time  6    Period  Weeks    Status  On-going      PT LONG TERM GOAL #4   Title  Patient to demonstrate overhead reaching to place 5# object on shelf overhead with <=1/10 pain.    Time  6    Period  Weeks    Status  On-going      PT LONG TERM GOAL #5   Title  Patient to report tolerance of dressing and grooming activities without pain limiting.    Time  6    Period  Weeks    Status  On-going            Plan - 07/16/19 1803    Clinical Impression Statement  Pt. performing updated HEP without issue.  Pt. tolerated mild progression of supine and sidelying AAROM/AROM activities well today.  Progressed sidelying ER, abduction, prone row, prone extension, and gently initiated AAROM wall slide without issue today.  Ended visit with ice/compression to shoulder to reduce pain and tone post-therex.    Personal Factors and Comorbidities  Age;Comorbidity 3+;Time since onset of injury/illness/exacerbation;Past/Current Experience;Profession    Comorbidities  migraine,  HTN, concussion, GERD, anxiety    Rehab Potential  Good    PT Treatment/Interventions  ADLs/Self Care Home  Management;Cryotherapy;Electrical Stimulation;Iontophoresis 4mg /ml Dexamethasone;Moist Heat;Therapeutic exercise;Therapeutic activities;Functional mobility training;Ultrasound;Neuromuscular re-education;Patient/family education;Manual techniques;Vasopneumatic Device;Taping;Energy conservation;Dry needling;Passive range of motion;Scar mobilization    PT Next Visit Plan  progress per protocol- completed week 7 on 07/18/19    Consulted and Agree with Plan of Care  Patient       Patient will benefit from skilled therapeutic intervention in order to improve the following deficits and impairments:  Hypomobility, Increased edema, Decreased scar mobility, Decreased activity tolerance, Decreased strength, Impaired UE functional use, Pain, Improper body mechanics, Decreased range of motion, Impaired flexibility, Postural dysfunction  Visit Diagnosis: Acute pain of right shoulder  Stiffness of right shoulder, not elsewhere classified  Muscle weakness (generalized)  Other symptoms and signs involving the musculoskeletal system     Problem List Patient Active Problem List   Diagnosis Date Noted  . S/P arthroscopy of right shoulder 04/17/2018    Bess Harvest, PTA 07/16/19 6:10 PM   [facility] [address]  [place], [place], [address] Phone: [phone]   Fax:  [phone]  Name: Monica Petty MRN: [id number] Date of Birth: [birth year]

## 2019-07-17 DIAGNOSIS — G4709 Other insomnia: Secondary | ICD-10-CM | POA: Diagnosis not present

## 2019-07-17 DIAGNOSIS — Z111 Encounter for screening for respiratory tuberculosis: Secondary | ICD-10-CM | POA: Diagnosis not present

## 2019-07-17 DIAGNOSIS — E6609 Other obesity due to excess calories: Secondary | ICD-10-CM | POA: Diagnosis not present

## 2019-07-17 DIAGNOSIS — I10 Essential (primary) hypertension: Secondary | ICD-10-CM | POA: Diagnosis not present

## 2019-07-17 DIAGNOSIS — Z6832 Body mass index (BMI) 32.0-32.9, adult: Secondary | ICD-10-CM | POA: Diagnosis not present

## 2019-07-17 DIAGNOSIS — Z9889 Other specified postprocedural states: Secondary | ICD-10-CM | POA: Diagnosis not present

## 2019-07-17 MED FILL — traZODone HCL 50 MG TABS: 50 | 15 days supply | Qty: 30 | Fill #0

## 2019-07-18 ENCOUNTER — Encounter: Payer: Self-pay | Admitting: Physical Therapy

## 2019-07-18 ENCOUNTER — Ambulatory Visit: Payer: PRIVATE HEALTH INSURANCE | Admitting: Physical Therapy

## 2019-07-18 ENCOUNTER — Other Ambulatory Visit: Payer: Self-pay

## 2019-07-18 DIAGNOSIS — M6281 Muscle weakness (generalized): Secondary | ICD-10-CM

## 2019-07-18 DIAGNOSIS — M25611 Stiffness of right shoulder, not elsewhere classified: Secondary | ICD-10-CM

## 2019-07-18 DIAGNOSIS — M25511 Pain in right shoulder: Secondary | ICD-10-CM | POA: Diagnosis not present

## 2019-07-18 DIAGNOSIS — R29898 Other symptoms and signs involving the musculoskeletal system: Secondary | ICD-10-CM

## 2019-07-18 NOTE — Therapy (Signed)
[facility] [address]  [place], [place], [address] Phone: [phone]   Fax:  [phone]  Physical Therapy Progress Note  Patient Details  Name: Monica Petty MRN: [id number] Date of Birth: 09-19-1966 Referring Provider (PT): Sydnee Cabal, MD   Encounter Date: 07/18/2019  PT End of Session - 07/18/19 1555    Visit Number  10    Number of Visits  26    Date for PT Re-Evaluation  09/12/19    Authorization Type  WC- Approved for as many visits as necessary until 09/18/19    PT Start Time  1359    PT Stop Time  1503    PT Time Calculation (min)  64 min    Equipment Utilized During Treatment  --    Activity Tolerance  Patient tolerated treatment well;Patient limited by pain    Behavior During Therapy  [facility] for tasks assessed/performed       Past Medical History:  Diagnosis Date  . Anxiety   . Diverticulosis of colon   . GERD (gastroesophageal reflux disease)   . History of colon polyps   . History of concussion    1987  fell of horse--- no residual  . Hypertension   . Migraine    occasional  . Rotator cuff tear    recurrent right side    Past Surgical History:  Procedure Laterality Date  . COLONOSCOPY  06/06/2016  . LAPAROSCOPIC CHOLECYSTECTOMY  06-03-2008   dr Ulyess Blossom  [facility]  . REDUCTION MAMMAPLASTY Bilateral 03-05-2002   dr barber  [facility]  . SHOULDER ARTHROSCOPY WITH ROTATOR CUFF REPAIR Right 05/23/2019   Procedure: SHOULDER ARTHROSCOPY WITH revision rotator cuff repair and  debirdement;  Surgeon: Sydnee Cabal, MD;  Location: [facility];  Service: Orthopedics;  Laterality: Right;  interscalene block  . SHOULDER ARTHROSCOPY WITH ROTATOR CUFF REPAIR AND SUBACROMIAL DECOMPRESSION Right 04/17/2018   Procedure: Right shoulder evaluation under anesthesia, scope, debridement, subacromial decompression, rotator cuff repair;  Surgeon: Sydnee Cabal, MD;  Location: [facility];  Service: Orthopedics;  Laterality: Right;  120 mins General with Intra Scalene Block  . VAGINAL HYSTERECTOMY  02-12-2002   dr Nori Riis  [facility]  . WISDOM TOOTH EXTRACTION  teen    There were no vitals filed for this visit.  Subjective Assessment - 07/18/19 1400    Subjective  Doing well today. Notes that she can wash her face and brush her teeth. Reports 35-40% improvement since surgery.    Pertinent History  migraine, HTN, concussion, GERD, anxiety    Diagnostic tests  none recent    Patient Stated Goals  get back to 100%    Currently in Pain?  Yes    Pain Score  2     Pain Location  Shoulder    Pain Orientation  Right    Pain Descriptors / Indicators  Discomfort    Pain Type  Acute pain;Surgical pain         [facility] PT Assessment - 07/18/19 0001      Assessment   Medical Diagnosis  F/U orthopedic assessment, s/p R RTC revision and debridement     Referring Provider (PT)  Sydnee Cabal, MD    Onset Date/Surgical Date  05/23/19      Observation/Other Assessments   Focus on Therapeutic Outcomes (FOTO)   shoulder: 41 (59% limited, 48% predicted)      AROM   Left Shoulder Flexion  139 Degrees  supine AAROM with cane; 85 deg sit flexion AAROM   Left Shoulder ABduction  114 Degrees   78 deg sitting abduction AAROM   Left Shoulder External Rotation  26 Degrees   supine AAROM with cane; 26 deg sitting ER AAROM     PROM   Right Shoulder Flexion  141 Degrees    Right Shoulder ABduction  118 Degrees   abduction/scaption   Right Shoulder Internal Rotation  73 Degrees    Right Shoulder External Rotation  55 Degrees                   [facility] Adult PT Treatment/Exercise - 07/18/19 0001      Shoulder Exercises: Supine   Protraction  Right;10 reps    Protraction Weight (lbs)  1    Protraction Limitations  serratus punch    External Rotation  Right;AAROM;5 reps    External Rotation Limitations  with wand    Flexion  AAROM;5 reps    Flexion Limitations  with wand     ABduction  AAROM;Right;5 reps    ABduction Limitations  with wand    Other Supine Exercises  R shoulder flexion AROM x5 with 1# x5      Shoulder Exercises: Sidelying   External Rotation  Strengthening;Right;10 reps;Weights    External Rotation Weight (lbs)  1      Shoulder Exercises: Pulleys   Flexion  3 minutes    Flexion Limitations  to tolerance    Scaption  3 minutes    Scaption Limitations  to tolerance      Vasopneumatic   Number Minutes Vasopneumatic   15 minutes    Vasopnuematic Location   Shoulder   R   Vasopneumatic Pressure  Low    Vasopneumatic Temperature   Coldest      Manual Therapy   Manual Therapy  Passive ROM    Manual therapy comments  supine    Passive ROM  R shoulder PROM flexion, abduction/scaption, IR, ER to tolerance   mild discomfort at end ranges, especially with abduction            PT Education - 07/18/19 1554    Education Details  update to HEP; discussion on objective progress with PT    Person(s) Educated  Patient    Methods  Explanation;Demonstration;Tactile cues;Verbal cues;Handout    Comprehension  Verbalized understanding;Returned demonstration       PT Short Term Goals - 07/18/19 1408      PT SHORT TERM GOAL #1   Title  Patient to be independent with initial HEP.    Time  3    Period  Weeks    Status  Achieved    Target Date  07/04/19        PT Long Term Goals - 07/18/19 1408      PT LONG TERM GOAL #1   Title  patient to be independent with advanced HEP    Time  8    Period  Weeks    Status  Partially Met   met for current   Target Date  09/12/19      PT LONG TERM GOAL #2   Title  Patient to demonstrate R shoulder AROM/PROM [facility] and without pain limiting.    Time  8    Period  Weeks    Status  On-going   improved in ER PROM; now able to tolerate supine and limited sitting AAROM   Target Date  09/12/19  PT LONG TERM GOAL #3   Title  patient to improve R shoulder strength to >/= 4+/5 without pain    Time   8    Period  Weeks    Status  Deferred   NT d/t surgical precautions   Target Date  09/12/19      PT LONG TERM GOAL #4   Title  Patient to demonstrate overhead reaching to place 5# object on shelf overhead with <=1/10 pain.    Time  8    Period  Weeks    Status  Deferred   unable at this time   Target Date  09/12/19      PT LONG TERM GOAL #5   Title  Patient to report tolerance of dressing and grooming activities without pain limiting.    Time  8    Period  Weeks    Status  On-going   reports still unable to perform these activities   Target Date  09/12/19            Plan - 07/18/19 1604    Clinical Impression Statement  Patient reporting 35-40% improvement in R shoulder since initial eval. Is now able to wash her face and brush her teeth without difficulty. However, notes that she is still unable to independently don a bra or do her hair. Patient has demonstrated improved improved R shoulder ER PROM. Now also able to tolerate supine and limited sitting AAROM with wand. Strength testing not assessed this session d/t post-surgical precautions. Patient tolerated supine and sideling RTC strengthening with light weight. D/t patient's c/o N/T in fingers with sidelying ER, advised patient to perform this exercise with a half-filled bottle of water as to weigh less than 1 lb. Patient reported understanding of all HEP changes this session. Ended session with [place] to R shoulder for post-exercise soreness. Patient showing slow but steady improvement towards goals. Would benefit from continued skilled PT services to address R shoulder weakness and return to more functional activities.    Comorbidities  migraine, HTN, concussion, GERD, anxiety    Rehab Potential  Good    PT Frequency  2x / week    PT Duration  8 weeks    PT Treatment/Interventions  ADLs/Self Care Home Management;Cryotherapy;Electrical Stimulation;Iontophoresis 2m/ml Dexamethasone;Moist Heat;Therapeutic  exercise;Therapeutic activities;Functional mobility training;Ultrasound;Neuromuscular re-education;Patient/family education;Manual techniques;Vasopneumatic Device;Taping;Energy conservation;Dry needling;Passive range of motion;Scar mobilization    PT Next Visit Plan  progress per protocol- completed week 7 on 07/18/19    Consulted and Agree with Plan of Care  Patient       Patient will benefit from skilled therapeutic intervention in order to improve the following deficits and impairments:  Hypomobility, Increased edema, Decreased scar mobility, Decreased activity tolerance, Decreased strength, Impaired UE functional use, Pain, Improper body mechanics, Decreased range of motion, Impaired flexibility, Postural dysfunction  Visit Diagnosis: Acute pain of right shoulder  Stiffness of right shoulder, not elsewhere classified  Muscle weakness (generalized)  Other symptoms and signs involving the musculoskeletal system     Problem List Patient Active Problem List   Diagnosis Date Noted  . S/P arthroscopy of right shoulder 04/17/2018     YJanene Harvey PT, DPT 07/18/19 4:17 PM   [facility] [address] [place] [address]Phone: [phone]  Fax:  [phone] Name: Monica VILLARRUELMRN: [id number] of Birth: 504-Feb-1967

## 2019-07-23 ENCOUNTER — Other Ambulatory Visit: Payer: Self-pay

## 2019-07-23 ENCOUNTER — Ambulatory Visit: Payer: PRIVATE HEALTH INSURANCE | Attending: Specialist | Admitting: Physical Therapy

## 2019-07-23 ENCOUNTER — Encounter: Payer: Self-pay | Admitting: Physical Therapy

## 2019-07-23 DIAGNOSIS — R29898 Other symptoms and signs involving the musculoskeletal system: Secondary | ICD-10-CM | POA: Diagnosis present

## 2019-07-23 DIAGNOSIS — M25511 Pain in right shoulder: Secondary | ICD-10-CM | POA: Insufficient documentation

## 2019-07-23 DIAGNOSIS — M6281 Muscle weakness (generalized): Secondary | ICD-10-CM | POA: Diagnosis present

## 2019-07-23 DIAGNOSIS — M25611 Stiffness of right shoulder, not elsewhere classified: Secondary | ICD-10-CM | POA: Insufficient documentation

## 2019-07-23 NOTE — Therapy (Signed)
[facility] [address]  [place], [place], [address] Phone: [phone]   Fax:  [phone]  Physical Therapy Treatment  Patient Details  Name: Monica Petty MRN: [id number] Date of Birth: Oct 04, 1965 Referring Provider (PT): Sydnee Cabal, MD   Encounter Date: 07/23/2019  PT End of Session - 07/23/19 1443    Visit Number  11    Number of Visits  26    Date for PT Re-Evaluation  09/12/19    Authorization Type  WC- Approved for as many visits as necessary until 09/18/19    PT Start Time  1358    PT Stop Time  1441    PT Time Calculation (min)  43 min    Activity Tolerance  Patient tolerated treatment well;Patient limited by pain    Behavior During Therapy  Monica Petty for tasks assessed/performed       Past Medical History:  Diagnosis Date  . Anxiety   . Diverticulosis of colon   . GERD (gastroesophageal reflux disease)   . History of colon polyps   . History of concussion    1987  fell of horse--- no residual  . Hypertension   . Migraine    occasional  . Rotator cuff tear    recurrent right side    Past Surgical History:  Procedure Laterality Date  . COLONOSCOPY  06/06/2016  . LAPAROSCOPIC CHOLECYSTECTOMY  06-03-2008   dr Ulyess Blossom  [facility]  . REDUCTION MAMMAPLASTY Bilateral 03-05-2002   dr barber  [facility]  . SHOULDER ARTHROSCOPY WITH ROTATOR CUFF REPAIR Right 05/23/2019   Procedure: SHOULDER ARTHROSCOPY WITH revision rotator cuff repair and  debirdement;  Surgeon: Sydnee Cabal, MD;  Location: [facility];  Service: Orthopedics;  Laterality: Right;  interscalene block  . SHOULDER ARTHROSCOPY WITH ROTATOR CUFF REPAIR AND SUBACROMIAL DECOMPRESSION Right 04/17/2018   Procedure: Right shoulder evaluation under anesthesia, scope, debridement, subacromial decompression, rotator cuff repair;  Surgeon: Sydnee Cabal, MD;  Location: [facility];  Service: Orthopedics;  Laterality: Right;  120  mins General with Intra Scalene Block  . VAGINAL HYSTERECTOMY  02-12-2002   dr Nori Riis  [facility]  . WISDOM TOOTH EXTRACTION  teen    There were no vitals filed for this visit.  Subjective Assessment - 07/23/19 1359    Subjective  Patient reporting that she had a Halloween bonfire over the weekend. Has been feeling some "movement" in her shoulder that she is requesting advice on.    Pertinent History  migraine, HTN, concussion, GERD, anxiety    Diagnostic tests  none recent    Patient Stated Goals  get back to 100%    Currently in Pain?  No/denies                       [facility] Adult PT Treatment/Exercise - 07/23/19 0001      Shoulder Exercises: Supine   External Rotation  Right;AAROM;10 reps    External Rotation Limitations  reclined AAROM with wand to tolerance   cues to maintain elbows in   Internal Rotation  AAROM;Right;10 reps    Internal Rotation Limitations  reclined AAROM with wand to tolerance    Flexion  AAROM;Right;10 reps    Flexion Limitations  reclined AAROM with wand to tolerance   c/o painful arc at ~90 and 120 deg   ABduction  AAROM;Right;10 reps    ABduction Limitations  reclined AAROM with wand to tolerance  Shoulder Exercises: Prone   Retraction  Right;10 reps;Strengthening    Retraction Weight (lbs)  2    Retraction Limitations  prone leaning on counter top    Extension  Right;10 reps;Strengthening    Extension Weight (lbs)  2    Extension Limitations  prone leaning on counter top      Shoulder Exercises: Sidelying   External Rotation  Strengthening;Right;10 reps;Weights    External Rotation Weight (lbs)  1    ABduction  Strengthening;Right;10 reps    ABduction Weight (lbs)  1    ABduction Limitations  thumb up   audible pop in R posterior shoulder with 1st couple reps     Shoulder Exercises: Pulleys   Flexion  3 minutes    Flexion Limitations  to tolerance    Scaption  3 minutes    Scaption Limitations  to tolerance      Manual Therapy    Manual Therapy  Passive ROM    Manual therapy comments  supine    Passive ROM  R shoulder PROM flexion, abduction/scaption, IR, ER to tolerance   palpable popping in R anterior shoulder with ER            PT Education - 07/23/19 1442    Education Details  update to HEP- instructed to add 2lbs to prone rows and extension, water bottle to sidelying abduction, and supine AAROM in recliner    Person(s) Educated  Patient    Methods  Explanation;Demonstration;Tactile cues;Verbal cues;Handout    Comprehension  Verbalized understanding;Returned demonstration       PT Short Term Goals - 07/18/19 1408      PT SHORT TERM GOAL #1   Title  Patient to be independent with initial HEP.    Time  3    Period  Weeks    Status  Achieved    Target Date  07/04/19        PT Long Term Goals - 07/18/19 1408      PT LONG TERM GOAL #1   Title  patient to be independent with advanced HEP    Time  8    Period  Weeks    Status  Partially Met   met for current   Target Date  09/12/19      PT LONG TERM GOAL #2   Title  Patient to demonstrate R shoulder AROM/PROM [facility] and without pain limiting.    Time  8    Period  Weeks    Status  On-going   improved in ER PROM; now able to tolerate supine and limited sitting AAROM   Target Date  09/12/19      PT LONG TERM GOAL #3   Title  patient to improve R shoulder strength to >/= 4+/5 without pain    Time  8    Period  Weeks    Status  Deferred   NT d/t surgical precautions   Target Date  09/12/19      PT LONG TERM GOAL #4   Title  Patient to demonstrate overhead reaching to place 5# object on shelf overhead with <=1/10 pain.    Time  8    Period  Weeks    Status  Deferred   unable at this time   Target Date  09/12/19      PT LONG TERM GOAL #5   Title  Patient to report tolerance of dressing and grooming activities without pain limiting.    Time  8  Period  Weeks    Status  On-going   reports still unable to perform these activities    Target Date  09/12/19            Plan - 07/23/19 1539    Clinical Impression Statement  Patient reporting sensation of movement in her R shoulder that she is requesting advice on. Palpable popping in R anterior shoulder evident when patient performing supine ER, however patient denying pain with this. Tolerated R shoulder PROM in all directions with intermittent popping in shoulder at mid-range. Progressed shoulder AAROM with patient in semi-reclined position. Patient noting painful arc of motion at ~90deg and 120 deg of flexion which resolved with increased reps. Patient also with audible R posterior shoulder popping with sidelying abduction which resolved after first couple of reps. D/t patient being fairly pain-free during episodes of popping, believe that this cavitation is benign. However, did advise patient to let us know if popping causes lasting pain. Patient reported understanding. Increased weighted resistance with prone rows and extension, which patient tolerated well. Updated HEP with exercises that were well-tolerated today. Patient without complaints at end of session and declined modalities    Comorbidities  migraine, HTN, concussion, GERD, anxiety    Rehab Potential  Good    PT Frequency  2x / week    PT Duration  8 weeks    PT Treatment/Interventions  ADLs/Self Care Home Management;Cryotherapy;Electrical Stimulation;Iontophoresis 32m/ml Dexamethasone;Moist Heat;Therapeutic exercise;Therapeutic activities;Functional mobility training;Ultrasound;Neuromuscular re-education;Patient/family education;Manual techniques;Vasopneumatic Device;Taping;Energy conservation;Dry needling;Passive range of motion;Scar mobilization    PT Next Visit Plan  progress per protocol- completed week 7 on 07/18/19    Consulted and Agree with Plan of Care  Patient       Patient will benefit from skilled therapeutic intervention in order to improve the following deficits and impairments:  Hypomobility,  Increased edema, Decreased scar mobility, Decreased activity tolerance, Decreased strength, Impaired UE functional use, Pain, Improper body mechanics, Decreased range of motion, Impaired flexibility, Postural dysfunction  Visit Diagnosis: Acute pain of right shoulder  Stiffness of right shoulder, not elsewhere classified  Muscle weakness (generalized)  Other symptoms and signs involving the musculoskeletal system     Problem List Patient Active Problem List   Diagnosis Date Noted  . S/P arthroscopy of right shoulder 04/17/2018     YJanene Harvey PT, DPT 07/23/19 3:45 PM   [facility] [address] [place] [address]Phone: [phone]  Fax:  [phone] Name: GMARVENE STROHMMRN: [id number] of Birth: [birth year]

## 2019-07-24 DIAGNOSIS — H52223 Regular astigmatism, bilateral: Secondary | ICD-10-CM | POA: Diagnosis not present

## 2019-07-24 DIAGNOSIS — H5213 Myopia, bilateral: Secondary | ICD-10-CM | POA: Diagnosis not present

## 2019-07-25 ENCOUNTER — Other Ambulatory Visit: Payer: Self-pay

## 2019-07-25 ENCOUNTER — Ambulatory Visit: Payer: PRIVATE HEALTH INSURANCE | Admitting: Physical Therapy

## 2019-07-25 ENCOUNTER — Encounter: Payer: Self-pay | Admitting: Physical Therapy

## 2019-07-25 DIAGNOSIS — M6281 Muscle weakness (generalized): Secondary | ICD-10-CM

## 2019-07-25 DIAGNOSIS — R29898 Other symptoms and signs involving the musculoskeletal system: Secondary | ICD-10-CM

## 2019-07-25 DIAGNOSIS — M25511 Pain in right shoulder: Secondary | ICD-10-CM | POA: Diagnosis not present

## 2019-07-25 DIAGNOSIS — M25611 Stiffness of right shoulder, not elsewhere classified: Secondary | ICD-10-CM

## 2019-07-25 NOTE — Therapy (Signed)
[facility] [address]  [place], [place], [address] Phone: [phone]   Fax:  [phone]  Physical Therapy Treatment  Patient Details  Name: Monica Petty MRN: [id number] Date of Birth: 04-29-1966 Referring Provider (PT): Sydnee Cabal, MD   Encounter Date: 07/25/2019  PT End of Session - 07/25/19 1445    Visit Number  12    Number of Visits  26    Date for PT Re-Evaluation  09/12/19    Authorization Type  WC- Approved for as many visits as necessary until 09/18/19    PT Start Time  1359    PT Stop Time  1453    PT Time Calculation (min)  54 min    Activity Tolerance  Patient tolerated treatment well;Patient limited by pain    Behavior During Therapy  [facility] for tasks assessed/performed       Past Medical History:  Diagnosis Date  . Anxiety   . Diverticulosis of colon   . GERD (gastroesophageal reflux disease)   . History of colon polyps   . History of concussion    1987  fell of horse--- no residual  . Hypertension   . Migraine    occasional  . Rotator cuff tear    recurrent right side    Past Surgical History:  Procedure Laterality Date  . COLONOSCOPY  06/06/2016  . LAPAROSCOPIC CHOLECYSTECTOMY  06-03-2008   dr Ulyess Blossom  [facility]  . REDUCTION MAMMAPLASTY Bilateral 03-05-2002   dr barber  [facility]  . SHOULDER ARTHROSCOPY WITH ROTATOR CUFF REPAIR Right 05/23/2019   Procedure: SHOULDER ARTHROSCOPY WITH revision rotator cuff repair and  debirdement;  Surgeon: Sydnee Cabal, MD;  Location: [facility];  Service: Orthopedics;  Laterality: Right;  interscalene block  . SHOULDER ARTHROSCOPY WITH ROTATOR CUFF REPAIR AND SUBACROMIAL DECOMPRESSION Right 04/17/2018   Procedure: Right shoulder evaluation under anesthesia, scope, debridement, subacromial decompression, rotator cuff repair;  Surgeon: Sydnee Cabal, MD;  Location: [facility];  Service: Orthopedics;  Laterality: Right;  120  mins General with Intra Scalene Block  . VAGINAL HYSTERECTOMY  02-12-2002   dr Nori Riis  [facility]  . WISDOM TOOTH EXTRACTION  teen    There were no vitals filed for this visit.  Subjective Assessment - 07/25/19 1400    Subjective  HEP is going well.    Pertinent History  migraine, HTN, concussion, GERD, anxiety    Diagnostic tests  none recent    Patient Stated Goals  get back to 100%    Currently in Pain?  No/denies                       [facility] Adult PT Treatment/Exercise - 07/25/19 0001      Shoulder Exercises: Supine   Flexion  Strengthening;Right;10 reps;Weights    Shoulder Flexion Weight (lbs)  2    Flexion Limitations  good control      Shoulder Exercises: Sidelying   External Rotation  Strengthening;Right;10 reps;Weights    External Rotation Weight (lbs)  2    External Rotation Limitations  report of muscle fatigue/burning    ABduction  Strengthening;Right;10 reps    ABduction Weight (lbs)  1    ABduction Limitations  thumb up      Shoulder Exercises: Standing   External Rotation  Strengthening;Right;10 reps;Theraband    Theraband Level (Shoulder External Rotation)  Level 1 (Yellow)    External Rotation Limitations  stepouts with shoulder in  neutral    Internal Rotation  Strengthening;Right;10 reps;Theraband    Theraband Level (Shoulder Internal Rotation)  Level 1 (Yellow)    Internal Rotation Limitations  stepouts with shoulder in neutral    Row  Strengthening;Both;10 reps;Theraband    Theraband Level (Shoulder Row)  Level 1 (Yellow)    Row Limitations  2x10 with cues to stop at neutral    Shoulder Elevation Limitations  B serratus rolls against wall x10 to tolerance    Other Standing Exercises  R shoulder wall towel slides x10   manual assistance to avoid R shoulder hike   Other Standing Exercises  R shoulder ball on wall circles 2x10CW/CCW   cues to increase size of circles with 2nd set     Shoulder Exercises: Pulleys   Flexion  3 minutes    Flexion  Limitations  to tolerance    Scaption  3 minutes    Scaption Limitations  to tolerance      Vasopneumatic   Number Minutes Vasopneumatic   10 minutes    Vasopnuematic Location   Shoulder   r   Vasopneumatic Pressure  Low    Vasopneumatic Temperature   Coldest             PT Education - 07/25/19 1445    Education Details  update to HEP    Person(s) Educated  Patient    Methods  Explanation;Demonstration;Tactile cues;Verbal cues;Handout    Comprehension  Verbalized understanding;Returned demonstration       PT Short Term Goals - 07/18/19 1408      PT SHORT TERM GOAL #1   Title  Patient to be independent with initial HEP.    Time  3    Period  Weeks    Status  Achieved    Target Date  07/04/19        PT Long Term Goals - 07/18/19 1408      PT LONG TERM GOAL #1   Title  patient to be independent with advanced HEP    Time  8    Period  Weeks    Status  Partially Met   met for current   Target Date  09/12/19      PT LONG TERM GOAL #2   Title  Patient to demonstrate R shoulder AROM/PROM [facility] and without pain limiting.    Time  8    Period  Weeks    Status  On-going   improved in ER PROM; now able to tolerate supine and limited sitting AAROM   Target Date  09/12/19      PT LONG TERM GOAL #3   Title  patient to improve R shoulder strength to >/= 4+/5 without pain    Time  8    Period  Weeks    Status  Deferred   NT d/t surgical precautions   Target Date  09/12/19      PT LONG TERM GOAL #4   Title  Patient to demonstrate overhead reaching to place 5# object on shelf overhead with <=1/10 pain.    Time  8    Period  Weeks    Status  Deferred   unable at this time   Target Date  09/12/19      PT LONG TERM GOAL #5   Title  Patient to report tolerance of dressing and grooming activities without pain limiting.    Time  8    Period  Weeks    Status  On-going   reports  still unable to perform these activities   Target Date  09/12/19            Plan  - 07/25/19 1446    Clinical Impression Statement  Patient reporting no issues with lasted HEP update. Worked on R shoulder AAROM against gravity with manual cues to avoid shoulder hiking and promote upward scapular rotation. Introduced scapular rows with excellent form. Also introduced isometric IR/ER strengthening with good ability to maintain neutral. Updated HEP with banded exercises as they were well-tolerated today. Patient able to tolerate addition of 2lbs to supine shoulder flexion with good tolerance and control during concentric and eccentric phase of movement. Ended session with [place] to R shoulder for post-exercise soreness. No complaints at end of session.    Comorbidities  migraine, HTN, concussion, GERD, anxiety    Rehab Potential  Good    PT Frequency  2x / week    PT Duration  8 weeks    PT Treatment/Interventions  ADLs/Self Care Home Management;Cryotherapy;Electrical Stimulation;Iontophoresis 30m/ml Dexamethasone;Moist Heat;Therapeutic exercise;Therapeutic activities;Functional mobility training;Ultrasound;Neuromuscular re-education;Patient/family education;Manual techniques;Vasopneumatic Device;Taping;Energy conservation;Dry needling;Passive range of motion;Scar mobilization    PT Next Visit Plan  progress per protocol- completed week 8 on 07/25/19    Consulted and Agree with Plan of Care  Patient       Patient will benefit from skilled therapeutic intervention in order to improve the following deficits and impairments:  Hypomobility, Increased edema, Decreased scar mobility, Decreased activity tolerance, Decreased strength, Impaired UE functional use, Pain, Improper body mechanics, Decreased range of motion, Impaired flexibility, Postural dysfunction  Visit Diagnosis: Acute pain of right shoulder  Stiffness of right shoulder, not elsewhere classified  Muscle weakness (generalized)  Other symptoms and signs involving the musculoskeletal system     Problem List Patient  Active Problem List   Diagnosis Date Noted  . S/P arthroscopy of right shoulder 04/17/2018     YJanene Harvey PT, DPT 07/25/19 3:39 PM   [facility] [address] [place] [address]Phone: [phone]  Fax:  [phone] Name: GLATREECE MOCHIZUKIMRN: [id number] of Birth: 5Jan 16, 1967

## 2019-07-29 ENCOUNTER — Ambulatory Visit: Payer: PRIVATE HEALTH INSURANCE

## 2019-07-29 ENCOUNTER — Other Ambulatory Visit: Payer: Self-pay

## 2019-07-29 DIAGNOSIS — M6281 Muscle weakness (generalized): Secondary | ICD-10-CM

## 2019-07-29 DIAGNOSIS — M25611 Stiffness of right shoulder, not elsewhere classified: Secondary | ICD-10-CM

## 2019-07-29 DIAGNOSIS — M25511 Pain in right shoulder: Secondary | ICD-10-CM

## 2019-07-29 DIAGNOSIS — R29898 Other symptoms and signs involving the musculoskeletal system: Secondary | ICD-10-CM

## 2019-07-29 NOTE — Therapy (Signed)
[facility] [address]  [address] [place], [place], [address] Phone: [phone]   Fax:  [phone]  Physical Therapy Treatment  Patient Details  Name: Monica Petty MRN: [id number] Date of Birth: [birth year] Referring Provider (PT): Sydnee Cabal, MD   Encounter Date: 07/29/2019  PT End of Session - 07/29/19 1403    Visit Number  13    Number of Visits  26    Date for PT Re-Evaluation  09/12/19    Authorization Type  WC- Approved for as many visits as necessary until 09/18/19    PT Start Time  1356    PT Stop Time  1443    PT Time Calculation (min)  47 min    Activity Tolerance  Patient tolerated treatment well;Patient limited by pain    Behavior During Therapy  [facility] for tasks assessed/performed       Past Medical History:  Diagnosis Date  . Anxiety   . Diverticulosis of colon   . GERD (gastroesophageal reflux disease)   . History of colon polyps   . History of concussion    1987  fell of horse--- no residual  . Hypertension   . Migraine    occasional  . Rotator cuff tear    recurrent right side    Past Surgical History:  Procedure Laterality Date  . COLONOSCOPY  06/06/2016  . LAPAROSCOPIC CHOLECYSTECTOMY  06-03-2008   dr Ulyess Blossom  [facility]  . REDUCTION MAMMAPLASTY Bilateral 03-05-2002   dr barber  [facility]  . SHOULDER ARTHROSCOPY WITH ROTATOR CUFF REPAIR Right 05/23/2019   Procedure: SHOULDER ARTHROSCOPY WITH revision rotator cuff repair and  debirdement;  Surgeon: Sydnee Cabal, MD;  Location: [facility];  Service: Orthopedics;  Laterality: Right;  interscalene block  . SHOULDER ARTHROSCOPY WITH ROTATOR CUFF REPAIR AND SUBACROMIAL DECOMPRESSION Right 04/17/2018   Procedure: Right shoulder evaluation under anesthesia, scope, debridement, subacromial decompression, rotator cuff repair;  Surgeon: Sydnee Cabal, MD;  Location: [facility];  Service: Orthopedics;  Laterality: Right;  120  mins General with Intra Scalene Block  . VAGINAL HYSTERECTOMY  02-12-2002   dr Nori Riis  [facility]  . WISDOM TOOTH EXTRACTION  teen    There were no vitals filed for this visit.  Subjective Assessment - 07/29/19 1401    Subjective  Pt. denies soreness after last session.    Pertinent History  migraine, HTN, concussion, GERD, anxiety    Diagnostic tests  none recent    Patient Stated Goals  get back to 100%    Currently in Pain?  No/denies    Pain Score  0-No pain    Pain Location  Shoulder    Pain Orientation  Right    Pain Descriptors / Indicators  Discomfort    Pain Type  Acute pain;Surgical pain    Multiple Pain Sites  No         [facility] PT Assessment - 07/29/19 0001      Assessment   Medical Diagnosis  F/U orthopedic assessment, s/p R RTC revision and debridement     Referring Provider (PT)  Sydnee Cabal, MD    Onset Date/Surgical Date  05/23/19    Hand Dominance  Right    Next MD Visit  08/07/19    Prior Therapy  yes      PROM   PROM Assessment Site  Shoulder    Right/Left Shoulder  Right    Right Shoulder Flexion  148 Degrees  Right Shoulder ABduction  131 Degrees    Right Shoulder Internal Rotation  75 Degrees    Right Shoulder External Rotation  58 Degrees                   [facility] Adult PT Treatment/Exercise - 07/29/19 0001      Shoulder Exercises: Supine   Protraction  Both;15 reps;Weights    Protraction Weight (lbs)  3    Protraction Limitations  serratus punch    Diagonals  Right;10 reps;Theraband;Strengthening    Theraband Level (Shoulder Diagonals)  Level 1 (Yellow)    Other Supine Exercises  R shoulder circles 2# in 90 dg flexion positioning  x 10 CW, CCW       Shoulder Exercises: Standing   External Rotation  Right;12 reps;Strengthening;Theraband    Theraband Level (Shoulder External Rotation)  Level 1 (Yellow)    External Rotation Limitations  stepouts with shoulder in neutral    Internal Rotation  Right;12 reps;Theraband;Strengthening     Theraband Level (Shoulder Internal Rotation)  Level 1 (Yellow)    Internal Rotation Limitations  stepouts with shoulder in neutral    Row  15 reps;Both;Strengthening;Theraband    Theraband Level (Shoulder Row)  Level 1 (Yellow)      Shoulder Exercises: Pulleys   Flexion  3 minutes    Flexion Limitations  to tolerance    Scaption  3 minutes    Scaption Limitations  to tolerance      Shoulder Exercises: ROM/Strengthening   Wall Pushups  15 reps    Wall Pushups Limitations  Very conservative     Pushups Limitations  serratus punch on wall in pushup position (conservative) x 15 reps              PT Education - 07/29/19 1526    Education Details  HEP update: B shoulder extension with yellow TB, red TB biceps curls    Person(s) Educated  Patient    Methods  Explanation;Demonstration;Verbal cues    Comprehension  Verbalized understanding;Returned demonstration;Verbal cues required       PT Short Term Goals - 07/18/19 1408      PT SHORT TERM GOAL #1   Title  Patient to be independent with initial HEP.    Time  3    Period  Weeks    Status  Achieved    Target Date  07/04/19        PT Long Term Goals - 07/18/19 1408      PT LONG TERM GOAL #1   Title  patient to be independent with advanced HEP    Time  8    Period  Weeks    Status  Partially Met   met for current   Target Date  09/12/19      PT LONG TERM GOAL #2   Title  Patient to demonstrate R shoulder AROM/PROM [facility] and without pain limiting.    Time  8    Period  Weeks    Status  On-going   improved in ER PROM; now able to tolerate supine and limited sitting AAROM   Target Date  09/12/19      PT LONG TERM GOAL #3   Title  patient to improve R shoulder strength to >/= 4+/5 without pain    Time  8    Period  Weeks    Status  Deferred   NT d/t surgical precautions   Target Date  09/12/19      PT LONG  TERM GOAL #4   Title  Patient to demonstrate overhead reaching to place 5# object on shelf overhead with  <=1/10 pain.    Time  8    Period  Weeks    Status  Deferred   unable at this time   Target Date  09/12/19      PT LONG TERM GOAL #5   Title  Patient to report tolerance of dressing and grooming activities without pain limiting.    Time  8    Period  Weeks    Status  On-going   reports still unable to perform these activities   Target Date  09/12/19            Plan - 07/29/19 1751    Clinical Impression Statement  Monica Petty doing well today with no new complaints.  Reports updated HEP going well.  Able to demo much improved R shoulder flexion, abduction PROM today and tolerated progression of supine and sidelying therex resistance well.  Able to demo excellent technique maintaining neutral positioning throughout R IR/ER isometric yellow TB step-outs.  Ended visit reporting she needed to leave without modalities due to family requirements.    Comorbidities  migraine, HTN, concussion, GERD, anxiety    Rehab Potential  Good    PT Treatment/Interventions  ADLs/Self Care Home Management;Cryotherapy;Electrical Stimulation;Iontophoresis 28m/ml Dexamethasone;Moist Heat;Therapeutic exercise;Therapeutic activities;Functional mobility training;Ultrasound;Neuromuscular re-education;Patient/family education;Manual techniques;Vasopneumatic Device;Taping;Energy conservation;Dry needling;Passive range of motion;Scar mobilization    PT Next Visit Plan  progress per protocol- completed week 8 on 07/25/19    Consulted and Agree with Plan of Care  Patient       Patient will benefit from skilled therapeutic intervention in order to improve the following deficits and impairments:  Hypomobility, Increased edema, Decreased scar mobility, Decreased activity tolerance, Decreased strength, Impaired UE functional use, Pain, Improper body mechanics, Decreased range of motion, Impaired flexibility, Postural dysfunction  Visit Diagnosis: Acute pain of right shoulder  Stiffness of right shoulder, not elsewhere  classified  Muscle weakness (generalized)  Other symptoms and signs involving the musculoskeletal system     Problem List Patient Active Problem List   Diagnosis Date Noted  . S/P arthroscopy of right shoulder 04/17/2018    MBess Harvest PTA 07/29/19 5:54 PM   [facility] [address] [place] [address]Phone: [phone]  Fax:  [phone] Name: Monica SHORKEYMRN: [id number] of Birth: [birth year]

## 2019-08-01 ENCOUNTER — Other Ambulatory Visit: Payer: Self-pay

## 2019-08-01 ENCOUNTER — Ambulatory Visit: Payer: PRIVATE HEALTH INSURANCE

## 2019-08-01 DIAGNOSIS — M25511 Pain in right shoulder: Secondary | ICD-10-CM | POA: Diagnosis not present

## 2019-08-01 DIAGNOSIS — M25611 Stiffness of right shoulder, not elsewhere classified: Secondary | ICD-10-CM

## 2019-08-01 DIAGNOSIS — R29898 Other symptoms and signs involving the musculoskeletal system: Secondary | ICD-10-CM

## 2019-08-01 DIAGNOSIS — M6281 Muscle weakness (generalized): Secondary | ICD-10-CM

## 2019-08-01 NOTE — Therapy (Signed)
[facility] [address]  [place], [place], [address] Phone: [phone]   Fax:  [phone]  Physical Therapy Treatment  Patient Details  Name: Monica Petty MRN: [id number] Date of Birth: 04/17/1966 Referring Provider (PT): Sydnee Cabal, MD   Encounter Date: 08/01/2019  PT End of Session - 08/01/19 1617    Visit Number  14    Number of Visits  26    Date for PT Re-Evaluation  09/12/19    Authorization Type  WC- Approved for as many visits as necessary until 09/18/19    PT Start Time  1615    PT Stop Time  1705    PT Time Calculation (min)  50 min    Activity Tolerance  Patient tolerated treatment well    Behavior During Therapy  [facility] for tasks assessed/performed       Past Medical History:  Diagnosis Date  . Anxiety   . Diverticulosis of colon   . GERD (gastroesophageal reflux disease)   . History of colon polyps   . History of concussion    1987  fell of horse--- no residual  . Hypertension   . Migraine    occasional  . Rotator cuff tear    recurrent right side    Past Surgical History:  Procedure Laterality Date  . COLONOSCOPY  06/06/2016  . LAPAROSCOPIC CHOLECYSTECTOMY  06-03-2008   dr Ulyess Blossom  [facility]  . REDUCTION MAMMAPLASTY Bilateral 03-05-2002   dr barber  [facility]  . SHOULDER ARTHROSCOPY WITH ROTATOR CUFF REPAIR Right 05/23/2019   Procedure: SHOULDER ARTHROSCOPY WITH revision rotator cuff repair and  debirdement;  Surgeon: Sydnee Cabal, MD;  Location: [facility];  Service: Orthopedics;  Laterality: Right;  interscalene block  . SHOULDER ARTHROSCOPY WITH ROTATOR CUFF REPAIR AND SUBACROMIAL DECOMPRESSION Right 04/17/2018   Procedure: Right shoulder evaluation under anesthesia, scope, debridement, subacromial decompression, rotator cuff repair;  Surgeon: Sydnee Cabal, MD;  Location: [facility];  Service: Orthopedics;  Laterality: Right;  120 mins General with  Intra Scalene Block  . VAGINAL HYSTERECTOMY  02-12-2002   dr Nori Riis  [facility]  . WISDOM TOOTH EXTRACTION  teen    There were no vitals filed for this visit.  Subjective Assessment - 08/01/19 1616    Subjective  Pt. doing well today with no new complaints.  cooked some yesterday and may have overdone it a bit however recovered today.    Pertinent History  migraine, HTN, concussion, GERD, anxiety    Diagnostic tests  none recent    Patient Stated Goals  get back to 100%    Currently in Pain?  No/denies    Pain Score  0-No pain    Multiple Pain Sites  No                       [facility] Adult PT Treatment/Exercise - 08/01/19 0001      Elbow Exercises   Elbow Extension  Right;Theraband;15 reps    Theraband Level (Elbow Extension)  Level 1 (Yellow)    Elbow Extension Limitations  closed in door      Shoulder Exercises: Supine   Protraction  Both;15 reps;Weights    Protraction Weight (lbs)  4    Protraction Limitations  serratus punch      Shoulder Exercises: Prone   Retraction  Right;15 reps;Strengthening;Weights    Retraction Weight (lbs)  4    Retraction Limitations  prone 4# row  Flexion  Both;10 reps;Strengthening    Flexion Limitations  Prone on mat table     Extension  Both;10 reps;Strengthening    Extension Limitations  Prone " I "leaning on orange p-ball on mat table     External Rotation  Right;10 reps    External Rotation Limitations  R only prone " W " on mat table    Horizontal ABduction 1  Both;10 reps;Strengthening    Horizontal ABduction 1 Limitations  Prone " T " on orange p-ball on table       Shoulder Exercises: Standing   External Rotation  Right;10 reps    Theraband Level (Shoulder External Rotation)  Level 1 (Yellow)    External Rotation Limitations  AROM - conservative band tension     Internal Rotation  Right;10 reps;Strengthening;Theraband    Theraband Level (Shoulder Internal Rotation)  Level 1 (Yellow)    Internal Rotation Limitations  AROM      Flexion  Both;10 reps    Flexion Limitations  flexion/scaption at wall       Shoulder Exercises: Pulleys   Flexion  3 minutes    Flexion Limitations  to tolerance    Scaption  3 minutes    Scaption Limitations  to tolerance      Shoulder Exercises: ROM/Strengthening   Cybex Row  15 reps    Cybex Row Limitations  5#    Wall Pushups  15 reps    Wall Pushups Limitations  Conservative + serratus punch     Other ROM/Strengthening Exercises  R shoulder rhythmic stabilization standing with R shoulder in sustained 90 dg positioning leaning on ball on wall + therapist perturbations x 30 sec              PT Education - 08/01/19 1709    Education Details  HEP update;  B shoulder flexion AROM, scaption AROM    Person(s) Educated  Patient    Methods  Explanation;Demonstration;Verbal cues;Handout    Comprehension  Verbalized understanding;Returned demonstration;Verbal cues required       PT Short Term Goals - 07/18/19 1408      PT SHORT TERM GOAL #1   Title  Patient to be independent with initial HEP.    Time  3    Period  Weeks    Status  Achieved    Target Date  07/04/19        PT Long Term Goals - 07/18/19 1408      PT LONG TERM GOAL #1   Title  patient to be independent with advanced HEP    Time  8    Period  Weeks    Status  Partially Met   met for current   Target Date  09/12/19      PT LONG TERM GOAL #2   Title  Patient to demonstrate R shoulder AROM/PROM [facility] and without pain limiting.    Time  8    Period  Weeks    Status  On-going   improved in ER PROM; now able to tolerate supine and limited sitting AAROM   Target Date  09/12/19      PT LONG TERM GOAL #3   Title  patient to improve R shoulder strength to >/= 4+/5 without pain    Time  8    Period  Weeks    Status  Deferred   NT d/t surgical precautions   Target Date  09/12/19      PT LONG TERM GOAL #4  Title  Patient to demonstrate overhead reaching to place 5# object on shelf overhead with  <=1/10 pain.    Time  8    Period  Weeks    Status  Deferred   unable at this time   Target Date  09/12/19      PT LONG TERM GOAL #5   Title  Patient to report tolerance of dressing and grooming activities without pain limiting.    Time  8    Period  Weeks    Status  On-going   reports still unable to perform these activities   Target Date  09/12/19            Plan - 08/01/19 1617    Clinical Impression Statement  Vieva reporting half day of increased R shoulder soreness after last session which subsided.  Tolerated progression per protocol today to scaption, flexion AROM raises against gravity 0-90 dg with good scapular mechanics demo'd by pt.  Also focused on dynamic stabilization and scapular strengthening activities.  pt. pain free throughout session today however did verbalizing R shoulder fatigue after elevation activities.  Ended visit with ice/compression to R shoulder to minimize post-exercise soreness.  Updated HEP (see pt. education section).    Comorbidities  migraine, HTN, concussion, GERD, anxiety    Rehab Potential  Good    PT Treatment/Interventions  ADLs/Self Care Home Management;Cryotherapy;Electrical Stimulation;Iontophoresis 76m/ml Dexamethasone;Moist Heat;Therapeutic exercise;Therapeutic activities;Functional mobility training;Ultrasound;Neuromuscular re-education;Patient/family education;Manual techniques;Vasopneumatic Device;Taping;Energy conservation;Dry needling;Passive range of motion;Scar mobilization    PT Next Visit Plan  progress per protocol- completed week 9 on 08/01/19    Consulted and Agree with Plan of Care  Patient       Patient will benefit from skilled therapeutic intervention in order to improve the following deficits and impairments:  Hypomobility, Increased edema, Decreased scar mobility, Decreased activity tolerance, Decreased strength, Impaired UE functional use, Pain, Improper body mechanics, Decreased range of motion, Impaired flexibility,  Postural dysfunction  Visit Diagnosis: Acute pain of right shoulder  Stiffness of right shoulder, not elsewhere classified  Muscle weakness (generalized)  Other symptoms and signs involving the musculoskeletal system     Problem List Patient Active Problem List   Diagnosis Date Noted  . S/P arthroscopy of right shoulder 04/17/2018    MBess Harvest PTA 08/01/19 6:06 PM   [facility] [address] [place] [address]Phone: [phone]  Fax:  [phone] Name: Monica BOVAMRN: [id number] of Birth: 511-09-1965

## 2019-08-05 ENCOUNTER — Ambulatory Visit: Payer: PRIVATE HEALTH INSURANCE

## 2019-08-05 ENCOUNTER — Other Ambulatory Visit: Payer: Self-pay

## 2019-08-05 DIAGNOSIS — R29898 Other symptoms and signs involving the musculoskeletal system: Secondary | ICD-10-CM

## 2019-08-05 DIAGNOSIS — M25511 Pain in right shoulder: Secondary | ICD-10-CM | POA: Diagnosis not present

## 2019-08-05 DIAGNOSIS — M25611 Stiffness of right shoulder, not elsewhere classified: Secondary | ICD-10-CM

## 2019-08-05 DIAGNOSIS — M6281 Muscle weakness (generalized): Secondary | ICD-10-CM

## 2019-08-05 NOTE — Therapy (Signed)
[facility] [address]  [place], [place], [address] Phone: [phone]   Fax:  [phone]  Physical Therapy Treatment  Patient Details  Name: Monica Petty MRN: [id number] Date of Birth: 04/21/1966 Referring Provider (PT): Sydnee Cabal, MD   Encounter Date: 08/05/2019  PT End of Session - 08/05/19 1406    Visit Number  15    Number of Visits  26    Date for PT Re-Evaluation  09/12/19    Authorization Type  WC- Approved for as many visits as necessary until 09/18/19    PT Start Time  1359    PT Stop Time  1455    PT Time Calculation (min)  56 min    Activity Tolerance  Patient tolerated treatment well    Behavior During Therapy  [facility] for tasks assessed/performed       Past Medical History:  Diagnosis Date  . Anxiety   . Diverticulosis of colon   . GERD (gastroesophageal reflux disease)   . History of colon polyps   . History of concussion    1987  fell of horse--- no residual  . Hypertension   . Migraine    occasional  . Rotator cuff tear    recurrent right side    Past Surgical History:  Procedure Laterality Date  . COLONOSCOPY  06/06/2016  . LAPAROSCOPIC CHOLECYSTECTOMY  06-03-2008   dr Ulyess Blossom  [facility]  . REDUCTION MAMMAPLASTY Bilateral 03-05-2002   dr barber  [facility]  . SHOULDER ARTHROSCOPY WITH ROTATOR CUFF REPAIR Right 05/23/2019   Procedure: SHOULDER ARTHROSCOPY WITH revision rotator cuff repair and  debirdement;  Surgeon: Sydnee Cabal, MD;  Location: [facility];  Service: Orthopedics;  Laterality: Right;  interscalene block  . SHOULDER ARTHROSCOPY WITH ROTATOR CUFF REPAIR AND SUBACROMIAL DECOMPRESSION Right 04/17/2018   Procedure: Right shoulder evaluation under anesthesia, scope, debridement, subacromial decompression, rotator cuff repair;  Surgeon: Sydnee Cabal, MD;  Location: [facility];  Service: Orthopedics;  Laterality: Right;  120 mins General with  Intra Scalene Block  . VAGINAL HYSTERECTOMY  02-12-2002   dr Nori Riis  [facility]  . WISDOM TOOTH EXTRACTION  teen    There were no vitals filed for this visit.  Subjective Assessment - 08/05/19 1403    Subjective  Pt. reporting still having fatigue with recliner AAROM flexion.    Pertinent History  migraine, HTN, concussion, GERD, anxiety    Currently in Pain?  Yes    Pain Score  2     Pain Location  Shoulder    Pain Orientation  Right    Pain Descriptors / Indicators  Tightness;Burning    Pain Type  Acute pain;Surgical pain    Multiple Pain Sites  No         [facility] PT Assessment - 08/05/19 0001      Assessment   Medical Diagnosis  F/U orthopedic assessment, s/p R RTC revision and debridement     Referring Provider (PT)  Sydnee Cabal, MD    Onset Date/Surgical Date  05/23/19    Hand Dominance  Right    Next MD Visit  08/07/19    Prior Therapy  yes      AROM   Right/Left Shoulder  Right    Right Shoulder Flexion  94 Degrees   sitting; 152 dg supine   Right Shoulder ABduction  110 Degrees   sitting; 130 dg supine    Right Shoulder Internal Rotation  --  FIR to beltline    Right Shoulder External Rotation  --   FER to lateral head    Left Shoulder Flexion  --    Left Shoulder ABduction  --      PROM   PROM Assessment Site  Shoulder    Right/Left Shoulder  Right    Right Shoulder Flexion  150 Degrees   with prolonged hold to minimize muscular guarding    Right Shoulder ABduction  131 Degrees    Right Shoulder Internal Rotation  80 Degrees    Right Shoulder External Rotation  53 Degrees      Strength   Right/Left Shoulder  Right    Right Shoulder Flexion  3-/5    Right Shoulder ABduction  3-/5    Right Shoulder Internal Rotation  4/5    Right Shoulder External Rotation  3+/5                   [facility] Adult PT Treatment/Exercise - 08/05/19 0001      Shoulder Exercises: Seated   External Rotation  Right;10 reps;Theraband;Strengthening    Theraband Level  (Shoulder External Rotation)  Level 1 (Yellow)    Internal Rotation  Right;15 reps;Strengthening    Theraband Level (Shoulder Internal Rotation)  Level 1 (Yellow)      Shoulder Exercises: Standing   Row  15 reps;Both;Strengthening;Theraband    Theraband Level (Shoulder Row)  Level 1 (Yellow)      Vasopneumatic   Number Minutes Vasopneumatic   10 minutes    Vasopnuematic Location   Shoulder   R ice compression    Vasopneumatic Pressure  Low    Vasopneumatic Temperature   Coldest               PT Short Term Goals - 07/18/19 1408      PT SHORT TERM GOAL #1   Title  Patient to be independent with initial HEP.    Time  3    Period  Weeks    Status  Achieved    Target Date  07/04/19        PT Long Term Goals - 08/05/19 1421      PT LONG TERM GOAL #1   Title  patient to be independent with advanced HEP    Time  8    Period  Weeks    Status  Partially Met   met for current     PT LONG TERM GOAL #2   Title  Patient to demonstrate R shoulder AROM/PROM WFL and without pain limiting.    Time  8    Period  Weeks    Status  partially met  improved in ER PROM; now able to tolerate supine and limited sitting AAROM     PT LONG TERM GOAL #3   Title  patient to improve R shoulder strength to >/= 4+/5 without pain    Time  8    Period  Weeks    Status  Deferred   NT d/t surgical precautions     PT LONG TERM GOAL #4   Title  Patient to demonstrate overhead reaching to place 5# object on shelf overhead with <=1/10 pain.    Time  8    Period  Weeks    Status  Deferred   unable at this time     PT LONG TERM GOAL #5   Title  Patient to report tolerance of dressing and grooming activities without pain limiting.    Time  8    Period  Weeks    Status  On-going   08/05/19: pt. reporting difficulty/painful with attaching bras strap, washing hair, fixing hair           Plan - 08/05/19 1420   Clinical Impression: Monica Petty reporting no recent R shoulder "flare-up" of  pain however does note R shoulder can be irritated at times with overhead reaching, fixing hair, difficulty clasping bra as her R shoulder IR AROM still very limited behind back.  LTG #2 partially achieved as R shoulder AROM in gravity minimized positioning (supine) met for flexion ROM however still most limited in seated (full gravity) elevation, IR, ER due to weakness.  R shoulder strength with MMT now 3-/5-4/5 strength.  LTG #3 ongoing.  LTG #4 of 5lb reaching to overhead shelf deferred at this time due to pt. limited ability to demo proper scapulohumeral rhythm.  LTG #5 ongoing as pt. continues to have difficulty at home clasping bra, cleaning hair in shower, and fixing hair.  Session with MT focused on improving R shoulder AROM ER, abduction, IR and scapular/RTC strengthening per protocol and pt. tolerance.  Pt. will continue to benefit from further skilled therapy to maximize functional strength and ROM or R shoulder.     Comorbidities  migraine, HTN, concussion, GERD, anxiety    Rehab Potential  Good    PT Treatment/Interventions  ADLs/Self Care Home Management;Cryotherapy;Electrical Stimulation;Iontophoresis 10m/ml Dexamethasone;Moist Heat;Therapeutic exercise;Therapeutic activities;Functional mobility training;Ultrasound;Neuromuscular re-education;Patient/family education;Manual techniques;Vasopneumatic Device;Taping;Energy conservation;Dry needling;Passive range of motion;Scar mobilization    PT Next Visit Plan  progress per protocol- completed week 9 on 08/01/19    Consulted and Agree with Plan of Care  Patient       Patient will benefit from skilled therapeutic intervention in order to improve the following deficits and impairments:  Hypomobility, Increased edema, Decreased scar mobility, Decreased activity tolerance, Decreased strength, Impaired UE functional use, Pain, Improper body mechanics, Decreased range of motion, Impaired flexibility, Postural dysfunction  Visit Diagnosis: Acute pain  of right shoulder  Stiffness of right shoulder, not elsewhere classified  Muscle weakness (generalized)  Other symptoms and signs involving the musculoskeletal system     Problem List Patient Active Problem List   Diagnosis Date Noted  . S/P arthroscopy of right shoulder 04/17/2018    MBess Harvest PTA 08/05/19 6:23 PM   [facility] [address] [place] [address]Phone: [phone]  Fax:  [phone] Name: Monica HUTTOMRN: [id number] of Birth: [birth year]

## 2019-08-08 ENCOUNTER — Encounter: Payer: Self-pay | Admitting: Physical Therapy

## 2019-08-08 ENCOUNTER — Other Ambulatory Visit: Payer: Self-pay

## 2019-08-08 ENCOUNTER — Ambulatory Visit: Payer: PRIVATE HEALTH INSURANCE | Admitting: Physical Therapy

## 2019-08-08 DIAGNOSIS — M25511 Pain in right shoulder: Secondary | ICD-10-CM

## 2019-08-08 DIAGNOSIS — R29898 Other symptoms and signs involving the musculoskeletal system: Secondary | ICD-10-CM

## 2019-08-08 DIAGNOSIS — M6281 Muscle weakness (generalized): Secondary | ICD-10-CM

## 2019-08-08 DIAGNOSIS — M25611 Stiffness of right shoulder, not elsewhere classified: Secondary | ICD-10-CM

## 2019-08-08 NOTE — Therapy (Signed)
[facility] [address]  [place], [place], [address] Phone: [phone]   Fax:  [phone]  Physical Therapy Treatment  Patient Details  Name: Monica Petty MRN: [id number] Date of Birth: 1966-09-01 Referring Provider (PT): Sydnee Cabal, MD   Encounter Date: 08/08/2019  PT End of Session - 08/08/19 1623    Visit Number  16    Number of Visits  26    Date for PT Re-Evaluation  09/12/19    Authorization Type  WC- Approved for as many visits as necessary until 09/18/19    PT Start Time  1400    PT Stop Time  1444    PT Time Calculation (min)  44 min    Activity Tolerance  Patient tolerated treatment well    Behavior During Therapy  [facility] for tasks assessed/performed       Past Medical History:  Diagnosis Date  . Anxiety   . Diverticulosis of colon   . GERD (gastroesophageal reflux disease)   . History of colon polyps   . History of concussion    1987  fell of horse--- no residual  . Hypertension   . Migraine    occasional  . Rotator cuff tear    recurrent right side    Past Surgical History:  Procedure Laterality Date  . COLONOSCOPY  06/06/2016  . LAPAROSCOPIC CHOLECYSTECTOMY  06-03-2008   dr Ulyess Blossom  [facility]  . REDUCTION MAMMAPLASTY Bilateral 03-05-2002   dr barber  [facility]  . SHOULDER ARTHROSCOPY WITH ROTATOR CUFF REPAIR Right 05/23/2019   Procedure: SHOULDER ARTHROSCOPY WITH revision rotator cuff repair and  debirdement;  Surgeon: Sydnee Cabal, MD;  Location: [facility];  Service: Orthopedics;  Laterality: Right;  interscalene block  . SHOULDER ARTHROSCOPY WITH ROTATOR CUFF REPAIR AND SUBACROMIAL DECOMPRESSION Right 04/17/2018   Procedure: Right shoulder evaluation under anesthesia, scope, debridement, subacromial decompression, rotator cuff repair;  Surgeon: Sydnee Cabal, MD;  Location: [facility];  Service: Orthopedics;  Laterality: Right;  120 mins General with  Intra Scalene Block  . VAGINAL HYSTERECTOMY  02-12-2002   dr Nori Riis  [facility]  . WISDOM TOOTH EXTRACTION  teen    There were no vitals filed for this visit.  Subjective Assessment - 08/08/19 1401    Subjective  Saw MD yesterday who released her to drive and return to light duty with a 2lb lifting restriction. Had MD feel the popping in her shoulder- he advised her that it was scar tissue.    Pertinent History  migraine, HTN, concussion, GERD, anxiety    Diagnostic tests  none recent    Patient Stated Goals  get back to 100%                       [facility] Adult PT Treatment/Exercise - 08/08/19 0001      Shoulder Exercises: Supine   Flexion  Strengthening;Right;10 reps;Weights    Shoulder Flexion Weight (lbs)  3    Flexion Limitations  good ROM    Other Supine Exercises  supine R shoulder scaption with 3# 2x5   guidance for proper movement pattern     Shoulder Exercises: Sidelying   Flexion  Strengthening;Right;5 reps    Flexion Limitations  2x5 within tolerance      Shoulder Exercises: Standing   External Rotation  Right;10 reps    Theraband Level (Shoulder External Rotation)  Level 1 (Yellow);Level 2 (Red)    External Rotation  Limitations  10x each; good form    Internal Rotation  Right;10 reps;Strengthening;Theraband    Theraband Level (Shoulder Internal Rotation)  Level 2 (Red)    Internal Rotation Limitations  AROM     Extension  Strengthening;Both;10 reps;Theraband    Theraband Level (Shoulder Extension)  Level 2 (Red)    Extension Weight (lbs)  good form    Row  Strengthening;Both;10 reps;Theraband    Theraband Level (Shoulder Row)  Level 2 (Red);Level 3 (Green)    Row Limitations  10x each; good form      Shoulder Exercises: Pulleys   Flexion  3 minutes    Flexion Limitations  to tolerance    Scaption  3 minutes    Scaption Limitations  to tolerance             PT Education - 08/08/19 1623    Education Details  update and consolidation of HEP     Person(s) Educated  Patient    Methods  Explanation;Demonstration;Tactile cues;Verbal cues;Handout    Comprehension  Verbalized understanding;Returned demonstration       PT Short Term Goals - 07/18/19 1408      PT SHORT TERM GOAL #1   Title  Patient to be independent with initial HEP.    Time  3    Period  Weeks    Status  Achieved    Target Date  07/04/19        PT Long Term Goals - 08/05/19 1421      PT LONG TERM GOAL #1   Title  patient to be independent with advanced HEP    Time  8    Period  Weeks    Status  Partially Met   met for current     PT LONG TERM GOAL #2   Title  Patient to demonstrate R shoulder AROM/PROM WFL and without pain limiting.    Time  8    Period  Weeks    Status  Partially Met   08/05/19: Improved PROM/AROM flexion, abduction however still limited in all planes with AROM due to end range superior shoulder pain and PROM limitation remaining in abduction, ER, IR     PT LONG TERM GOAL #3   Title  patient to improve R shoulder strength to >/= 4+/5 without pain    Time  8    Period  Weeks    Status  On-going   08/05/19: grossly 3-/5-3+/5     PT LONG TERM GOAL #4   Title  Patient to demonstrate overhead reaching to place 5# object on shelf overhead with <=1/10 pain.    Time  8    Period  Weeks    Status  Deferred   08/05/19: deferred due to poor scapulohumeral mechanics     PT LONG TERM GOAL #5   Title  Patient to report tolerance of dressing and grooming activities without pain limiting.    Time  8    Period  Weeks    Status  On-going   08/05/19: pt. reporting difficulty/painful with attaching bras strap, washing hair, fixing hair           Plan - 08/08/19 1624    Clinical Impression Statement  Patient reporting that she was released to drive and return to light duty work with 2lb lifting restriction by MD yesterday. Worked on supine flexion and scaption with increased weighted resistance. Patient able to perform with good form as  scapulae are fixed in this position, preventing shoulder  hiking. Also able to perform good form with sidelying flexion, which was a new addition today. Reviewed and consolidated HEP with increase in banded resistance as patient performed periscapular and RTC strengthening ther-ex with good form today. Patient reported understanding of all changes to HEP and with no complaints at end of session. Patient progressing well per POC.    Comorbidities  migraine, HTN, concussion, GERD, anxiety    Rehab Potential  Good    PT Treatment/Interventions  ADLs/Self Care Home Management;Cryotherapy;Electrical Stimulation;Iontophoresis 61m/ml Dexamethasone;Moist Heat;Therapeutic exercise;Therapeutic activities;Functional mobility training;Ultrasound;Neuromuscular re-education;Patient/family education;Manual techniques;Vasopneumatic Device;Taping;Energy conservation;Dry needling;Passive range of motion;Scar mobilization    PT Next Visit Plan  progress per protocol- completed week 9 on 08/01/19    Consulted and Agree with Plan of Care  Patient       Patient will benefit from skilled therapeutic intervention in order to improve the following deficits and impairments:  Hypomobility, Increased edema, Decreased scar mobility, Decreased activity tolerance, Decreased strength, Impaired UE functional use, Pain, Improper body mechanics, Decreased range of motion, Impaired flexibility, Postural dysfunction  Visit Diagnosis: Acute pain of right shoulder  Stiffness of right shoulder, not elsewhere classified  Muscle weakness (generalized)  Other symptoms and signs involving the musculoskeletal system     Problem List Patient Active Problem List   Diagnosis Date Noted  . S/P arthroscopy of right shoulder 04/17/2018     YJanene Harvey PT, DPT 08/08/19 4:26 PM   [facility] [address] [place] [address]Phone: [phone]  Fax:   [phone] Name: Monica GORANSONMRN: [id number] of Birth: 5Jan 10, 1967

## 2019-08-09 MED FILL — HYDROCHLOROTHIAZIDE 25 MG T: 25 | 90 days supply | Qty: 90 | Fill #0

## 2019-08-09 MED FILL — ALPRAZolam 0.5 MG TABS: 0.5 | 30 days supply | Qty: 90 | Fill #3

## 2019-08-12 ENCOUNTER — Ambulatory Visit: Payer: PRIVATE HEALTH INSURANCE

## 2019-08-12 ENCOUNTER — Other Ambulatory Visit: Payer: Self-pay

## 2019-08-12 DIAGNOSIS — M25511 Pain in right shoulder: Secondary | ICD-10-CM | POA: Diagnosis not present

## 2019-08-12 DIAGNOSIS — M6281 Muscle weakness (generalized): Secondary | ICD-10-CM

## 2019-08-12 DIAGNOSIS — M25611 Stiffness of right shoulder, not elsewhere classified: Secondary | ICD-10-CM

## 2019-08-12 DIAGNOSIS — R29898 Other symptoms and signs involving the musculoskeletal system: Secondary | ICD-10-CM

## 2019-08-12 NOTE — Therapy (Signed)
[facility] [address]  [place], [place], [address] Phone: [phone]   Fax:  [phone]  Physical Therapy Treatment  Patient Details  Name: RENAY CRAMMER MRN: [id number] Date of Birth: [birth year] Referring Provider (PT): Sydnee Cabal, MD   Encounter Date: 08/12/2019  PT End of Session - 08/12/19 1408    Visit Number  17    Number of Visits  26    Date for PT Re-Evaluation  09/12/19    Authorization Type  WC- Approved for as many visits as necessary until 09/18/19    PT Start Time  1401    PT Stop Time  1443    PT Time Calculation (min)  42 min    Activity Tolerance  Patient tolerated treatment well    Behavior During Therapy  [facility] for tasks assessed/performed       Past Medical History:  Diagnosis Date  . Anxiety   . Diverticulosis of colon   . GERD (gastroesophageal reflux disease)   . History of colon polyps   . History of concussion    1987  fell of horse--- no residual  . Hypertension   . Migraine    occasional  . Rotator cuff tear    recurrent right side    Past Surgical History:  Procedure Laterality Date  . COLONOSCOPY  06/06/2016  . LAPAROSCOPIC CHOLECYSTECTOMY  06-03-2008   dr Ulyess Blossom  [facility]  . REDUCTION MAMMAPLASTY Bilateral 03-05-2002   dr barber  [facility]  . SHOULDER ARTHROSCOPY WITH ROTATOR CUFF REPAIR Right 05/23/2019   Procedure: SHOULDER ARTHROSCOPY WITH revision rotator cuff repair and  debirdement;  Surgeon: Sydnee Cabal, MD;  Location: [facility];  Service: Orthopedics;  Laterality: Right;  interscalene block  . SHOULDER ARTHROSCOPY WITH ROTATOR CUFF REPAIR AND SUBACROMIAL DECOMPRESSION Right 04/17/2018   Procedure: Right shoulder evaluation under anesthesia, scope, debridement, subacromial decompression, rotator cuff repair;  Surgeon: Sydnee Cabal, MD;  Location: [facility];  Service: Orthopedics;  Laterality: Right;  120 mins General with  Intra Scalene Block  . VAGINAL HYSTERECTOMY  02-12-2002   dr Nori Riis  [facility]  . WISDOM TOOTH EXTRACTION  teen    There were no vitals filed for this visit.  Subjective Assessment - 08/12/19 1405    Subjective  Pt. noting MD released her to begin work with 2# lifting restriction.    Pertinent History  migraine, HTN, concussion, GERD, anxiety    Diagnostic tests  none recent    Patient Stated Goals  get back to 100%    Currently in Pain?  No/denies    Pain Score  0-No pain    Multiple Pain Sites  No         [facility] PT Assessment - 08/12/19 0001      Assessment   Medical Diagnosis  F/U orthopedic assessment, s/p R RTC revision and debridement     Referring Provider (PT)  Sydnee Cabal, MD    Onset Date/Surgical Date  05/23/19    Hand Dominance  Right    Next MD Visit  09/04/19    Prior Therapy  yes                   [facility] Adult PT Treatment/Exercise - 08/12/19 0001      Shoulder Exercises: Prone   Flexion  Both;10 reps;Strengthening    Flexion Limitations  prone "Y" on green p-ball     Extension  Both;10 reps;Strengthening  Extension Limitations  Prone " I " on green p-ball     Horizontal ABduction 1  Both;10 reps;Strengthening    Horizontal ABduction 1 Limitations  Prone " T " on on green p-ball       Shoulder Exercises: Standing   External Rotation  Right;12 reps    Theraband Level (Shoulder External Rotation)  Level 1 (Yellow);Level 2 (Red)    Internal Rotation  Right;15 reps;AROM    Theraband Level (Shoulder Internal Rotation)  Level 2 (Red)    Internal Rotation Limitations  AROM     Flexion  Both   x 12 reps    Shoulder Flexion Weight (lbs)  1    Flexion Limitations  leaning on wall     ABduction  Both   x 12 reps    Shoulder ABduction Weight (lbs)  1    ABduction Limitations  leaning on wall     Extension  15 reps;Both;Strengthening;Theraband    Theraband Level (Shoulder Extension)  Level 2 (Red)    Extension Limitations  Encouraged mild hyperextension  to pt. tolerance     Row  15 reps;Theraband;Strengthening;Both    Theraband Level (Shoulder Row)  Level 3 (Green)      Shoulder Exercises: Pulleys   Flexion  3 minutes    Flexion Limitations  to tolerance    Scaption  3 minutes    Scaption Limitations  to tolerance      Shoulder Exercises: ROM/Strengthening   Cybex Row  15 reps    Cybex Row Limitations  10# - low row      Shoulder Exercises: Stretch   Corner Stretch  2 reps;20 seconds    Corner Stretch Limitations  mid pec stretch on door frame     Other Shoulder Stretches  R posterior/inferior stretch 2 x 30 sec       Manual Therapy   Manual Therapy  Soft tissue mobilization    Manual therapy comments  standing     Soft tissue mobilization  STM to R infra             PT Education - 08/12/19 1445    Education Details  HEP update;  R only pec stretch on door    Person(s) Educated  Patient    Methods  Explanation;Demonstration;Verbal cues;Handout    Comprehension  Verbalized understanding;Returned demonstration;Verbal cues required       PT Short Term Goals - 07/18/19 1408      PT SHORT TERM GOAL #1   Title  Patient to be independent with initial HEP.    Time  3    Period  Weeks    Status  Achieved    Target Date  07/04/19        PT Long Term Goals - 08/05/19 1421      PT LONG TERM GOAL #1   Title  patient to be independent with advanced HEP    Time  8    Period  Weeks    Status  Partially Met   met for current     PT LONG TERM GOAL #2   Title  Patient to demonstrate R shoulder AROM/PROM WFL and without pain limiting.    Time  8    Period  Weeks    Status  Partially Met   08/05/19: Improved PROM/AROM flexion, abduction however still limited in all planes with AROM due to end range superior shoulder pain and PROM limitation remaining in abduction, ER, IR     PT  LONG TERM GOAL #3   Title  patient to improve R shoulder strength to >/= 4+/5 without pain    Time  8    Period  Weeks    Status  On-going    08/05/19: grossly 3-/5-3+/5     PT LONG TERM GOAL #4   Title  Patient to demonstrate overhead reaching to place 5# object on shelf overhead with <=1/10 pain.    Time  8    Period  Weeks    Status  Deferred   08/05/19: deferred due to poor scapulohumeral mechanics     PT LONG TERM GOAL #5   Title  Patient to report tolerance of dressing and grooming activities without pain limiting.    Time  8    Period  Weeks    Status  On-going   08/05/19: pt. reporting difficulty/painful with attaching bras strap, washing hair, fixing hair           Plan - 08/12/19 1447    Clinical Impression Statement  Anelly reporting MD released her to go back to work on 2# lifting restriction at recent f/u.  Anticipates she may return to some capacity in alternate job within the week.  Tolerated progression of flexion, scaption lifting activities well today.  Able to mildly progress reps with RTC strengthening without issue.  Pt. opted to skip modalities to end session as she had to be somewhat.    Comorbidities  migraine, HTN, concussion, GERD, anxiety    Rehab Potential  Good    PT Treatment/Interventions  ADLs/Self Care Home Management;Cryotherapy;Electrical Stimulation;Iontophoresis 60m/ml Dexamethasone;Moist Heat;Therapeutic exercise;Therapeutic activities;Functional mobility training;Ultrasound;Neuromuscular re-education;Patient/family education;Manual techniques;Vasopneumatic Device;Taping;Energy conservation;Dry needling;Passive range of motion;Scar mobilization    PT Next Visit Plan  progress per protocol- completed week 11 on 08/15/19    Consulted and Agree with Plan of Care  Patient       Patient will benefit from skilled therapeutic intervention in order to improve the following deficits and impairments:  Hypomobility, Increased edema, Decreased scar mobility, Decreased activity tolerance, Decreased strength, Impaired UE functional use, Pain, Improper body mechanics, Decreased range of motion,  Impaired flexibility, Postural dysfunction  Visit Diagnosis: Acute pain of right shoulder  Stiffness of right shoulder, not elsewhere classified  Muscle weakness (generalized)  Other symptoms and signs involving the musculoskeletal system     Problem List Patient Active Problem List   Diagnosis Date Noted  . S/P arthroscopy of right shoulder 04/17/2018    MBess Harvest PTA 08/12/19 6:33 PM   [facility] [address] [place] [address]Phone: [phone]  Fax:  [phone] Name: GALDEN FEAGANMRN: [id number] of Birth: [birth year]

## 2019-08-14 ENCOUNTER — Encounter: Payer: Self-pay | Admitting: Physical Therapy

## 2019-08-14 ENCOUNTER — Ambulatory Visit: Payer: PRIVATE HEALTH INSURANCE | Admitting: Physical Therapy

## 2019-08-14 ENCOUNTER — Other Ambulatory Visit: Payer: Self-pay

## 2019-08-14 DIAGNOSIS — M6281 Muscle weakness (generalized): Secondary | ICD-10-CM

## 2019-08-14 DIAGNOSIS — R29898 Other symptoms and signs involving the musculoskeletal system: Secondary | ICD-10-CM

## 2019-08-14 DIAGNOSIS — M25611 Stiffness of right shoulder, not elsewhere classified: Secondary | ICD-10-CM

## 2019-08-14 DIAGNOSIS — M25511 Pain in right shoulder: Secondary | ICD-10-CM | POA: Diagnosis not present

## 2019-08-14 NOTE — Therapy (Signed)
[facility] [address]  [place], [place], [address] Phone: [phone]   Fax:  [phone]  Physical Therapy Treatment  Patient Details  Name: Monica Petty MRN: [id number] Date of Birth: May 24, 1966 Referring Provider (PT): Sydnee Cabal, MD   Encounter Date: 08/14/2019  PT End of Session - 08/14/19 1759    Visit Number  18    Number of Visits  26    Date for PT Re-Evaluation  09/12/19    Authorization Type  WC- Approved for as many visits as necessary until 09/18/19    PT Start Time  1400    PT Stop Time  1442    PT Time Calculation (min)  42 min    Activity Tolerance  Patient tolerated treatment well;Patient limited by pain    Behavior During Therapy  [facility] for tasks assessed/performed       Past Medical History:  Diagnosis Date  . Anxiety   . Diverticulosis of colon   . GERD (gastroesophageal reflux disease)   . History of colon polyps   . History of concussion    1987  fell of horse--- no residual  . Hypertension   . Migraine    occasional  . Rotator cuff tear    recurrent right side    Past Surgical History:  Procedure Laterality Date  . COLONOSCOPY  06/06/2016  . LAPAROSCOPIC CHOLECYSTECTOMY  06-03-2008   dr Ulyess Blossom  [facility]  . REDUCTION MAMMAPLASTY Bilateral 03-05-2002   dr barber  [facility]  . SHOULDER ARTHROSCOPY WITH ROTATOR CUFF REPAIR Right 05/23/2019   Procedure: SHOULDER ARTHROSCOPY WITH revision rotator cuff repair and  debirdement;  Surgeon: Sydnee Cabal, MD;  Location: [facility];  Service: Orthopedics;  Laterality: Right;  interscalene block  . SHOULDER ARTHROSCOPY WITH ROTATOR CUFF REPAIR AND SUBACROMIAL DECOMPRESSION Right 04/17/2018   Procedure: Right shoulder evaluation under anesthesia, scope, debridement, subacromial decompression, rotator cuff repair;  Surgeon: Sydnee Cabal, MD;  Location: [facility];  Service: Orthopedics;  Laterality: Right;   120 mins General with Intra Scalene Block  . VAGINAL HYSTERECTOMY  02-12-2002   dr Nori Riis  [facility]  . WISDOM TOOTH EXTRACTION  teen    There were no vitals filed for this visit.  Subjective Assessment - 08/14/19 1402    Subjective  Got scratched by her son's puppy, resulkting in a bit of bleeding on her dorsal R hand. 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    Pertinent History  migraine, HTN, concussion, GERD, anxiety    Diagnostic tests  none recent    Patient Stated Goals  get back to 100%    Currently in Pain?  No/denies                       [facility] Adult PT Treatment/Exercise - 08/14/19 0001      Shoulder Exercises: Seated   Flexion  Strengthening;Right;5 reps;Weights    Flexion Weight (lbs)  1    Flexion Limitations  2x5 to 90 deg to avoid shoulder hike    Abduction  Strengthening;Right;5 reps    ABduction Weight (lbs)  1    ABduction Limitations  2x5 with 1# to 90 deg    Other Seated Exercises  R shoulder scaption to 90deg 2x5 with 1#      Shoulder Exercises: Prone   Flexion  Strengthening;5 reps;Right    Flexion Limitations  2x5; "I" prone over counter  Horizontal ABduction 1  Strengthening;Right;5 reps    Horizontal ABduction 1 Limitations  2x5; "T" prone over counter     Other Prone Exercises  R UE "Y" prone over counter 2x5       Shoulder Exercises: Sidelying   Other Sidelying Exercises  serratus flexion rollups with foam roll and yellow TB around forearms x10   cues to maintain forearms parallel     Shoulder Exercises: Standing   External Rotation  Strengthening;Both;10 reps;Theraband    Theraband Level (Shoulder External Rotation)  Level 1 (Yellow)    External Rotation Limitations  c/o muscle spasm in anterior/lateral deltoid which dissipated      Shoulder Exercises: ROM/Strengthening   UBE (Upper Arm Bike)  L1 x 3 min forward/3 min back             PT Education - 08/14/19 1759    Education Details  update and consolidation of HEP  for max benefit    Person(s) Educated  Patient    Methods  Explanation;Demonstration;Tactile cues;Verbal cues;Handout    Comprehension  Verbalized understanding;Returned demonstration       PT Short Term Goals - 07/18/19 1408      PT SHORT TERM GOAL #1   Title  Patient to be independent with initial HEP.    Time  3    Period  Weeks    Status  Achieved    Target Date  07/04/19        PT Long Term Goals - 08/05/19 1421      PT LONG TERM GOAL #1   Title  patient to be independent with advanced HEP    Time  8    Period  Weeks    Status  Partially Met   met for current     PT LONG TERM GOAL #2   Title  Patient to demonstrate R shoulder AROM/PROM WFL and without pain limiting.    Time  8    Period  Weeks    Status  Partially Met   08/05/19: Improved PROM/AROM flexion, abduction however still limited in all planes with AROM due to end range superior shoulder pain and PROM limitation remaining in abduction, ER, IR     PT LONG TERM GOAL #3   Title  patient to improve R shoulder strength to >/= 4+/5 without pain    Time  8    Period  Weeks    Status  On-going   08/05/19: grossly 3-/5-3+/5     PT LONG TERM GOAL #4   Title  Patient to demonstrate overhead reaching to place 5# object on shelf overhead with <=1/10 pain.    Time  8    Period  Weeks    Status  Deferred   08/05/19: deferred due to poor scapulohumeral mechanics     PT LONG TERM GOAL #5   Title  Patient to report tolerance of dressing and grooming activities without pain limiting.    Time  8    Period  Weeks    Status  On-going   08/05/19: pt. 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 Reviewed patient's HEP handouts for max benefit and agreement on most helpful exercises thus far. Patient able to perform flexion,  scaption, and abduction against gravity with demonstration of  shoulder hiking at end ranges. Thus advised patient to perform these movements within more limited ROM to avoid perpetuating improper scapular mechanics. Patient reporting more challenge with prone elevation exercises, thus updated HEP with these exercises- patient reported understanding. Initiated B shoulder ER with light banded resistance, with patient demonstrating great form, however with report of muscle spasm-type pain in R anterolateral deltoid after completing the exercise. Patient reporting experiencing this pain before while drying her hair. Pain quickly dissipated after <1 minute and patient without further complaints at end of session. Patient progressing well towards goals.    Comorbidities  migraine, HTN, concussion, GERD, anxiety    Rehab Potential  Good    PT Treatment/Interventions  ADLs/Self Care Home Management;Cryotherapy;Electrical Stimulation;Iontophoresis 57m/ml Dexamethasone;Moist Heat;Therapeutic exercise;Therapeutic activities;Functional mobility training;Ultrasound;Neuromuscular re-education;Patient/family education;Manual techniques;Vasopneumatic Device;Taping;Energy conservation;Dry needling;Passive range of motion;Scar mobilization    PT Next Visit Plan  progress per protocol- completed week 11 on 08/15/19    Consulted and Agree with Plan of Care  Patient       Patient will benefit from skilled therapeutic intervention in order to improve the following deficits and impairments:  Hypomobility, Increased edema, Decreased scar mobility, Decreased activity tolerance, Decreased strength, Impaired UE functional use, Pain, Improper body mechanics, Decreased range of motion, Impaired flexibility, Postural dysfunction  Visit Diagnosis: Acute pain of right shoulder  Stiffness of right shoulder, not elsewhere classified  Muscle weakness (generalized)  Other symptoms and signs involving the musculoskeletal  system     Problem List Patient Active Problem List   Diagnosis Date Noted  . S/P arthroscopy of right shoulder 04/17/2018     YJanene Harvey PT, DPT 08/14/19 6:04 PM   [facility] [address] [place] [address]Phone: [phone]  Fax:  [phone] Name: Monica MCQUOWNMRN: [id number] of Birth: 501/02/1966

## 2019-08-19 ENCOUNTER — Ambulatory Visit: Payer: PRIVATE HEALTH INSURANCE

## 2019-08-19 ENCOUNTER — Other Ambulatory Visit: Payer: Self-pay

## 2019-08-19 DIAGNOSIS — M6281 Muscle weakness (generalized): Secondary | ICD-10-CM

## 2019-08-19 DIAGNOSIS — M25511 Pain in right shoulder: Secondary | ICD-10-CM | POA: Diagnosis not present

## 2019-08-19 DIAGNOSIS — M25611 Stiffness of right shoulder, not elsewhere classified: Secondary | ICD-10-CM

## 2019-08-19 DIAGNOSIS — R29898 Other symptoms and signs involving the musculoskeletal system: Secondary | ICD-10-CM

## 2019-08-19 NOTE — Therapy (Signed)
[facility] [address]  [place], [place], [address] Phone: [phone]   Fax:  [phone]  Physical Therapy Treatment  Patient Details  Name: Monica Petty MRN: [id number] Date of Birth: [birth year] Referring Provider (PT): Sydnee Cabal, MD   Encounter Date: 08/19/2019  PT End of Session - 08/19/19 1409    Visit Number  19    Number of Visits  26    Date for PT Re-Evaluation  09/12/19    Authorization Type  WC- Approved for as many visits as necessary until 09/18/19    PT Start Time  1404    PT Stop Time  1445    PT Time Calculation (min)  41 min    Activity Tolerance  Patient tolerated treatment well;Patient limited by pain    Behavior During Therapy  [facility] for tasks assessed/performed       Past Medical History:  Diagnosis Date  . Anxiety   . Diverticulosis of colon   . GERD (gastroesophageal reflux disease)   . History of colon polyps   . History of concussion    1987  fell of horse--- no residual  . Hypertension   . Migraine    occasional  . Rotator cuff tear    recurrent right side    Past Surgical History:  Procedure Laterality Date  . COLONOSCOPY  06/06/2016  . LAPAROSCOPIC CHOLECYSTECTOMY  06-03-2008   dr Ulyess Blossom  [facility]  . REDUCTION MAMMAPLASTY Bilateral 03-05-2002   dr barber  [facility]  . SHOULDER ARTHROSCOPY WITH ROTATOR CUFF REPAIR Right 05/23/2019   Procedure: SHOULDER ARTHROSCOPY WITH revision rotator cuff repair and  debirdement;  Surgeon: Sydnee Cabal, MD;  Location: [facility];  Service: Orthopedics;  Laterality: Right;  interscalene block  . SHOULDER ARTHROSCOPY WITH ROTATOR CUFF REPAIR AND SUBACROMIAL DECOMPRESSION Right 04/17/2018   Procedure: Right shoulder evaluation under anesthesia, scope, debridement, subacromial decompression, rotator cuff repair;  Surgeon: Sydnee Cabal, MD;  Location: [facility];  Service: Orthopedics;  Laterality: Right;   120 mins General with Intra Scalene Block  . VAGINAL HYSTERECTOMY  02-12-2002   dr Nori Riis  [facility]  . WISDOM TOOTH EXTRACTION  teen    There were no vitals filed for this visit.  Subjective Assessment - 08/19/19 1405    Subjective  Doing well.    Pertinent History  migraine, HTN, concussion, GERD, anxiety    Diagnostic tests  none recent    Patient Stated Goals  get back to 100%    Currently in Pain?  No/denies    Pain Score  0-No pain    Multiple Pain Sites  No                       [facility] Adult PT Treatment/Exercise - 08/19/19 0001      Shoulder Exercises: Standing   Flexion  Both;12 reps    Shoulder Flexion Weight (lbs)  1    Flexion Limitations  leaning on wall     ABduction  Both;12 reps    Shoulder ABduction Weight (lbs)  1    ABduction Limitations  leaning on wall     Other Standing Exercises  B scaption 1# leaning on wall x 12 rpes       Shoulder Exercises: Pulleys   Flexion  3 minutes    Flexion Limitations  to tolerance    Scaption  3 minutes    Scaption Limitations  to tolerance  Shoulder Exercises: ROM/Strengthening   UBE (Upper Arm Bike)  L1 x 3 min forward/3 min back    Cybex Press  20 reps    Cybex Press Limitations  Serratus punch; 20#    Wall Pushups  15 reps    Wall Pushups Limitations  cues for elbows by side       Shoulder Exercises: Stretch   Other Shoulder Stretches  R posterior/inferior stretch 2 x 30 sec    Pt. noting "stretch" sensation in area of spasm      Manual Therapy   Manual Therapy  Soft tissue mobilization;Myofascial release    Manual therapy comments  supine and sidelying     Soft tissue mobilization  STM/DTM to R infra    Myofascial Release  TPR to R infra, teres group              PT Education - 08/19/19 1456    Education Details  HEP update; posterior/inferior shoulder stretch    Person(s) Educated  Patient    Methods  Explanation;Demonstration;Verbal cues;Handout    Comprehension  Verbalized  understanding;Returned demonstration;Verbal cues required       PT Short Term Goals - 07/18/19 1408      PT SHORT TERM GOAL #1   Title  Patient to be independent with initial HEP.    Time  3    Period  Weeks    Status  Achieved    Target Date  07/04/19        PT Long Term Goals - 08/05/19 1421      PT LONG TERM GOAL #1   Title  patient to be independent with advanced HEP    Time  8    Period  Weeks    Status  Partially Met   met for current     PT LONG TERM GOAL #2   Title  Patient to demonstrate R shoulder AROM/PROM WFL and without pain limiting.    Time  8    Period  Weeks    Status  Partially Met   08/05/19: Improved PROM/AROM flexion, abduction however still limited in all planes with AROM due to end range superior shoulder pain and PROM limitation remaining in abduction, ER, IR     PT LONG TERM GOAL #3   Title  patient to improve R shoulder strength to >/= 4+/5 without pain    Time  8    Period  Weeks    Status  On-going   08/05/19: grossly 3-/5-3+/5     PT LONG TERM GOAL #4   Title  Patient to demonstrate overhead reaching to place 5# object on shelf overhead with <=1/10 pain.    Time  8    Period  Weeks    Status  Deferred   08/05/19: deferred due to poor scapulohumeral mechanics     PT LONG TERM GOAL #5   Title  Patient to report tolerance of dressing and grooming activities without pain limiting.    Time  8    Period  Weeks    Status  On-going   08/05/19: pt. reporting difficulty/painful with attaching bras strap, washing hair, fixing hair           Plan - 08/19/19 1410    Clinical Impression Statement  [name] doing well today however primary complaint was a few "spasms" which she has felt since last visit in posterior/lateral shoulder.  MT addressing increased tension/tightness in this area and pt. instructed in stretching for posterior/inferior shoulder  with HEP updated accordingly.  Pt. noting relief form tightness in posterior shoulder after  session today and was able to tolerated continued strengthening into scaption, flexion well without increased pain.  Ended visit pain free thus modalities deferred.     Comorbidities  migraine, HTN, concussion, GERD, anxiety    Rehab Potential  Good    PT Treatment/Interventions  ADLs/Self Care Home Management;Cryotherapy;Electrical Stimulation;Iontophoresis 53m/ml Dexamethasone;Moist Heat;Therapeutic exercise;Therapeutic activities;Functional mobility training;Ultrasound;Neuromuscular re-education;Patient/family education;Manual techniques;Vasopneumatic Device;Taping;Energy conservation;Dry needling;Passive range of motion;Scar mobilization    PT Next Visit Plan  progress per protocol- completed week 11 on 08/15/19    Consulted and Agree with Plan of Care  Patient       Patient will benefit from skilled therapeutic intervention in order to improve the following deficits and impairments:  Hypomobility, Increased edema, Decreased scar mobility, Decreased activity tolerance, Decreased strength, Impaired UE functional use, Pain, Improper body mechanics, Decreased range of motion, Impaired flexibility, Postural dysfunction  Visit Diagnosis: Acute pain of right shoulder  Stiffness of right shoulder, not elsewhere classified  Muscle weakness (generalized)  Other symptoms and signs involving the musculoskeletal system     Problem List Patient Active Problem List   Diagnosis Date Noted  . S/P arthroscopy of right shoulder 04/17/2018    MBess Harvest PTA 08/19/19 6:58 PM   [facility] [place] [address]Phone: [phone]  Fax:  [phone] Name: Monica MCCUTCHEONMRN: [id number] of Birth: 510-17-1967

## 2019-08-22 ENCOUNTER — Ambulatory Visit: Payer: PRIVATE HEALTH INSURANCE | Attending: Specialist

## 2019-08-22 ENCOUNTER — Other Ambulatory Visit: Payer: Self-pay

## 2019-08-22 DIAGNOSIS — M6281 Muscle weakness (generalized): Secondary | ICD-10-CM | POA: Diagnosis present

## 2019-08-22 DIAGNOSIS — M25511 Pain in right shoulder: Secondary | ICD-10-CM | POA: Insufficient documentation

## 2019-08-22 DIAGNOSIS — R29898 Other symptoms and signs involving the musculoskeletal system: Secondary | ICD-10-CM | POA: Diagnosis present

## 2019-08-22 DIAGNOSIS — M25611 Stiffness of right shoulder, not elsewhere classified: Secondary | ICD-10-CM | POA: Diagnosis present

## 2019-08-22 NOTE — Therapy (Signed)
[facility] [place], [place], [address] Phone: [phone]   Fax:  [phone]  Physical Therapy Treatment  Patient Details  Name: Monica Petty MRN: [id number] Date of Birth: 24-Nov-1965 Referring Provider (PT): Sydnee Cabal, MD   Encounter Date: 08/22/2019  PT End of Session - 08/22/19 1501    Visit Number  20    Number of Visits  26    Date for PT Re-Evaluation  09/12/19    Authorization Type  WC- Approved for as many visits as necessary until 09/18/19    PT Start Time  1450   Pt. noting she needed to leave session early   PT Stop Time  1530   Ended visit with5 min  ice/compression to R shoulder   PT Time Calculation (min)  40 min    Activity Tolerance  Patient tolerated treatment well;Patient limited by pain    Behavior During Therapy  [facility] for tasks assessed/performed       Past Medical History:  Diagnosis Date  . Anxiety   . Diverticulosis of colon   . GERD (gastroesophageal reflux disease)   . History of colon polyps   . History of concussion    1987  fell of horse--- no residual  . Hypertension   . Migraine    occasional  . Rotator cuff tear    recurrent right side    Past Surgical History:  Procedure Laterality Date  . COLONOSCOPY  06/06/2016  . LAPAROSCOPIC CHOLECYSTECTOMY  06-03-2008   dr Ulyess Blossom  [facility]  . REDUCTION MAMMAPLASTY Bilateral 03-05-2002   dr barber  [facility]  . SHOULDER ARTHROSCOPY WITH ROTATOR CUFF REPAIR Right 05/23/2019   Procedure: SHOULDER ARTHROSCOPY WITH revision rotator cuff repair and  debirdement;  Surgeon: Sydnee Cabal, MD;  Location: [facility];  Service: Orthopedics;  Laterality: Right;  interscalene block  . SHOULDER ARTHROSCOPY WITH ROTATOR CUFF REPAIR AND SUBACROMIAL DECOMPRESSION Right 04/17/2018   Procedure: Right shoulder evaluation under anesthesia, scope, debridement, subacromial decompression, rotator cuff repair;  Surgeon:  Sydnee Cabal, MD;  Location: [facility];  Service: Orthopedics;  Laterality: Right;  120 mins General with Intra Scalene Block  . VAGINAL HYSTERECTOMY  02-12-2002   dr Nori Riis  [facility]  . WISDOM TOOTH EXTRACTION  teen    There were no vitals filed for this visit.  Subjective Assessment - 08/22/19 1459    Subjective  Pt. noting some R shoulder irritation due to having to reach for phone and other modified work tasks today.    Pertinent History  migraine, HTN, concussion, GERD, anxiety    Diagnostic tests  none recent    Patient Stated Goals  get back to 100%    Currently in Pain?  Yes    Pain Score  3     Pain Location  Shoulder    Pain Orientation  Right;Anterior;Posterior;Lateral    Pain Descriptors / Indicators  Aching    Pain Type  Acute pain;Surgical pain    Pain Onset  More than a month ago    Pain Frequency  Constant    Aggravating Factors   reaching for phone    Multiple Pain Sites  No         [facility] PT Assessment - 08/22/19 0001      AROM   AROM Assessment Site  Shoulder    Right/Left Shoulder  Right    Right Shoulder Flexion  135 Degrees  Right Shoulder ABduction  145 Degrees    Right Shoulder Internal Rotation  --   FIR to 2" below strap    Right Shoulder External Rotation  --   FER to C3   Left Shoulder Flexion  153 Degrees    Left Shoulder ABduction  160 Degrees    Left Shoulder Internal Rotation  --   FIR to 4" above strap    Left Shoulder External Rotation  --   FER to T4     PROM   PROM Assessment Site  Shoulder    Right/Left Shoulder  Right    Right Shoulder Flexion  154 Degrees    Right Shoulder ABduction  140 Degrees    Right Shoulder Internal Rotation  78 Degrees    Right Shoulder External Rotation  60 Degrees      Strength   Right/Left Shoulder  Right    Right Shoulder Flexion  4-/5    Right Shoulder ABduction  4/5    Right Shoulder Internal Rotation  4+/5    Right Shoulder External Rotation  4/5                    [facility] Adult PT Treatment/Exercise - 08/22/19 0001      Self-Care   Self-Care  Other Self-Care Comments    Other Self-Care Comments   self ball release to posterior/inferior shoulder on wall       Shoulder Exercises: Standing   Other Standing Exercises  2# flexion reach to cabinet x 1       Shoulder Exercises: ROM/Strengthening   UBE (Upper Arm Bike)  L1 x 3 min forward/3 min back      Shoulder Exercises: Stretch   Other Shoulder Stretches  R posterior/inferior stretch 2 x 30 sec       Vasopneumatic   Number Minutes Vasopneumatic   5 minutes    Vasopnuematic Location   Shoulder    Vasopneumatic Pressure  Low    Vasopneumatic Temperature   Coldest               PT Short Term Goals - 07/18/19 1408      PT SHORT TERM GOAL #1   Title  Patient to be independent with initial HEP.    Time  3    Period  Weeks    Status  Achieved    Target Date  07/04/19        PT Long Term Goals - 08/22/19 1503      PT LONG TERM GOAL #1   Title  patient to be independent with advanced HEP    Time  8    Period  Weeks    Status  Partially Met   met for current     PT LONG TERM GOAL #2   Title  Patient to demonstrate R shoulder AROM/PROM WFL and without pain limiting.    Time  8    Period  Weeks    Status  Partially Met   08/22/19: improving R shoulder PROM and AROM     PT LONG TERM GOAL #3   Title  patient to improve R shoulder strength to >/= 4+/5 without pain    Time  8    Period  Weeks    Status  Partially Met   08/22/19: met for IR     PT LONG TERM GOAL #4   Title  Patient to demonstrate overhead reaching to place 5# object on shelf overhead with <=1/10  pain.    Time  8    Period  Weeks    Status  Partially Met   08/22/19: able to achive with 2# weight avoiding >2# lifting retriction     PT LONG TERM GOAL #5   Title  Patient to report tolerance of dressing and grooming activities without pain limiting.    Time  8    Period  Weeks     Status  Partially Met   08/22/19: occasionally with posterior shoulder "spasm" while fixing hair and altering her dressing pattern "right arm in first" in order to avoid shoulder pain           Plan - 08/22/19 1520    Clinical Impression Statement  Monica Petty has made good progress with physical therapy.  Able to demo improved R shoulder strength with MMT in all muscle groups partially achieving strength goal.  LTG #3 partially achieved.  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  Pain did not limit her in session today during strengthening and ROM activities.  Pt. able to progress toward LTG #2 demonstrating good improvement in R shoulder PROM/AROM most seen with elevation flexion, scaption motions against gravity.  Progressing toward LTG #4 now able to lift 2# to overhead shelf with less reported pain and improved scapular mechanics.  Pt. does report she is able to more easily perform grooming and dressing tasks at home however still noting significant shoulder discomfort with reaching back of head to dry hair.  LTG #5 partially achieved.  Pt. on track to meet remaining goals in POC and will continue to benefit from further skilled therapy to maximize functional strength and ROM.    Comorbidities  migraine, HTN, concussion, GERD, anxiety    Rehab Potential  Good    PT Treatment/Interventions  ADLs/Self Care Home Management;Cryotherapy;Electrical Stimulation;Iontophoresis 62m/ml Dexamethasone;Moist Heat;Therapeutic exercise;Therapeutic activities;Functional mobility training;Ultrasound;Neuromuscular re-education;Patient/family education;Manual techniques;Vasopneumatic Device;Taping;Energy conservation;Dry needling;Passive range of motion;Scar mobilization    PT Next Visit Plan  progress per protocol- completed week 11 on 08/22/19    Consulted and Agree with Plan of Care  Patient       Patient will benefit from skilled therapeutic  intervention in order to improve the following deficits and impairments:  Hypomobility, Increased edema, Decreased scar mobility, Decreased activity tolerance, Decreased strength, Impaired UE functional use, Pain, Improper body mechanics, Decreased range of motion, Impaired flexibility, Postural dysfunction  Visit Diagnosis: Acute pain of right shoulder  Stiffness of right shoulder, not elsewhere classified  Muscle weakness (generalized)  Other symptoms and signs involving the musculoskeletal system     Problem List Patient Active Problem List   Diagnosis Date Noted  . S/P arthroscopy of right shoulder 04/17/2018    MBess Harvest PTA 08/22/19 6:26 PM    [facility] [address] [place] [address]Phone: [phone]  Fax:  [phone] Name: GZETTA STONEMANMRN: [id number] of Birth: [birth year]

## 2019-08-27 ENCOUNTER — Other Ambulatory Visit: Payer: Self-pay

## 2019-08-27 ENCOUNTER — Ambulatory Visit: Payer: PRIVATE HEALTH INSURANCE

## 2019-08-27 DIAGNOSIS — M25511 Pain in right shoulder: Secondary | ICD-10-CM | POA: Diagnosis not present

## 2019-08-27 DIAGNOSIS — R29898 Other symptoms and signs involving the musculoskeletal system: Secondary | ICD-10-CM

## 2019-08-27 DIAGNOSIS — M6281 Muscle weakness (generalized): Secondary | ICD-10-CM

## 2019-08-27 DIAGNOSIS — M25611 Stiffness of right shoulder, not elsewhere classified: Secondary | ICD-10-CM

## 2019-08-27 NOTE — Therapy (Signed)
[facility] [address]  [place], [place], [address] Phone: [phone]   Fax:  [phone]  Physical Therapy Treatment  Patient Details  Name: Monica Petty MRN: [id number] Date of Birth: Dec 19, 1965 Referring Provider (PT): Sydnee Cabal, MD   Encounter Date: 08/27/2019  PT End of Session - 08/27/19 1459    Visit Number  21    Number of Visits  26    Date for PT Re-Evaluation  09/12/19    Authorization Type  WC- Approved for as many visits as necessary until 09/18/19    PT Start Time  1453    PT Stop Time  1531    PT Time Calculation (min)  38 min    Activity Tolerance  Patient tolerated treatment well;Patient limited by pain    Behavior During Therapy  [facility] for tasks assessed/performed       Past Medical History:  Diagnosis Date  . Anxiety   . Diverticulosis of colon   . GERD (gastroesophageal reflux disease)   . History of colon polyps   . History of concussion    1987  fell of horse--- no residual  . Hypertension   . Migraine    occasional  . Rotator cuff tear    recurrent right side    Past Surgical History:  Procedure Laterality Date  . COLONOSCOPY  06/06/2016  . LAPAROSCOPIC CHOLECYSTECTOMY  06-03-2008   dr U[facility]ess Blossom  [facility]  . REDUCTION MAMMAPLASTY Bilateral 03-05-2002   dr barber  [facility]  . SHOULDER ARTHROSCOPY WITH ROTATOR CUFF REPAIR Right 05/23/2019   Procedure: SHOULDER ARTHROSCOPY WITH revision rotator cuff repair and  debirdement;  Surgeon: Sydnee Cabal, MD;  Location: [facility];  Service: Orthopedics;  Laterality: Right;  interscalene block  . SHOULDER ARTHROSCOPY WITH ROTATOR CUFF REPAIR AND SUBACROMIAL DECOMPRESSION Right 04/17/2018   Procedure: Right shoulder evaluation under anesthesia, scope, debridement, subacromial decompression, rotator cuff repair;  Surgeon: Sydnee Cabal, MD;  [facility];  Service: Orthopedics;  Laterality: Right;  120  mins General with Intra Scalene Block  . VAGINAL HYSTERECTOMY  02-12-2002   dr Nori Riis  [facility]  . WISDOM TOOTH EXTRACTION  teen    There were no vitals filed for this visit.  Subjective Assessment - 08/27/19 1459    Subjective  Pt. doing well.  had mild pain intermittently over weekend.    Pertinent History  migraine, HTN, concussion, GERD, anxiety    Diagnostic tests  none recent    Patient Stated Goals  get back to 100%    Currently in Pain?  No/denies    Pain Score  0-No pain    Multiple Pain Sites  No         [facility] PT Assessment - 08/27/19 0001      Observation/Other Assessments   Focus on Therapeutic Outcomes (FOTO)   57% (43 limitation)                   [facility] Adult PT Treatment/Exercise - 08/27/19 0001      Shoulder Exercises: Prone   Flexion  Both;10 reps;Strengthening    Flexion Limitations  "I" prone over green p-ball     Extension  Both;15 reps;Strengthening    Extension Limitations  I's" prone over green p-ball    External Rotation  Both;10 reps    External Rotation Limitations  Prone "w" over green p-ball     Horizontal ABduction 1  Both;10 reps  Horizontal ABduction 1 Limitations  "T" green p-ball       Shoulder Exercises: Standing   Horizontal ABduction  10 reps;Both;Strengthening    Theraband Level (Shoulder Horizontal ABduction)  Level 1 (Yellow)    Horizontal ABduction Limitations  performed in low ROM    band closed in door      Shoulder Exercises: ROM/Strengthening   UBE (Upper Arm Bike)  L1.5 x 3 min forward/3 min back    Wall Pushups  15 reps    Wall Pushups Limitations  leaning on orange p-ball       Shoulder Exercises: Stretch   Other Shoulder Stretches  R posterior/inferior stretch 2 x 30 sec     Other Shoulder Stretches  R lats stretch 2 x 30 sec - green p-ball rollouts to L       Shoulder Exercises: Body Blade   External Rotation Limitations  20"     Internal Rotation Limitations  20"               PT Short Term  Goals - 07/18/19 1408      PT SHORT TERM GOAL #1   Title  Patient to be independent with initial HEP.    Time  3    Period  Weeks    Status  Achieved    Target Date  07/04/19        PT Long Term Goals - 08/22/19 1503      PT LONG TERM GOAL #1   Title  patient to be independent with advanced HEP    Time  8    Period  Weeks    Status  Partially Met   met for current     PT LONG TERM GOAL #2   Title  Patient to demonstrate R shoulder AROM/PROM WFL and without pain limiting.    Time  8    Period  Weeks    Status  Partially Met   08/22/19: improving R shoulder PROM and AROM     PT LONG TERM GOAL #3   Title  patient to improve R shoulder strength to >/= 4+/5 without pain    Time  8    Period  Weeks    Status  Partially Met   08/22/19: met for IR     PT LONG TERM GOAL #4   Title  Patient to demonstrate overhead reaching to place 5# object on shelf overhead with <=1/10 pain.    Time  8    Period  Weeks    Status  Partially Met   08/22/19: able to achive with 2# weight avoiding >2# lifting retriction     PT LONG TERM GOAL #5   Title  Patient to report tolerance of dressing and grooming activities without pain limiting.    Time  8    Period  Weeks    Status  Partially Met   08/22/19: occasionally with posterior shoulder "spasm" while fixing hair and altering her dressing pattern "right arm in first" in order to avoid shoulder pain           Plan - 08/27/19 1500    Clinical Impression Statement  Evianna doing well with no new complaints.  Tolerated progression of body blade rhythmic stabilization and machine strengthening activities well today.  Still with end range flexion discomfort in full flexion activities at times however tolerating elevation better at this point in sessions.  Ended visit pain free thus modalities deferred.    Comorbidities  migraine,  HTN, concussion, GERD, anxiety    Rehab Potential  Good    PT Treatment/Interventions  ADLs/Self Care Home  Management;Cryotherapy;Electrical Stimulation;Iontophoresis 54m/ml Dexamethasone;Moist Heat;Therapeutic exercise;Therapeutic activities;Functional mobility training;Ultrasound;Neuromuscular re-education;Patient/family education;Manual techniques;Vasopneumatic Device;Taping;Energy conservation;Dry needling;Passive range of motion;Scar mobilization    PT Next Visit Plan  progress per protocol- completed week 12 on 08/29/19    Consulted and Agree with Plan of Care  Patient       Patient will benefit from skilled therapeutic intervention in order to improve the following deficits and impairments:  Hypomobility, Increased edema, Decreased scar mobility, Decreased activity tolerance, Decreased strength, Impaired UE functional use, Pain, Improper body mechanics, Decreased range of motion, Impaired flexibility, Postural dysfunction  Visit Diagnosis: Acute pain of right shoulder  Stiffness of right shoulder, not elsewhere classified  Muscle weakness (generalized)  Other symptoms and signs involving the musculoskeletal system     Problem List Patient Active Problem List   Diagnosis Date Noted  . S/P arthroscopy of right shoulder 04/17/2018   MBess Harvest PTA 08/27/19 6:19 PM    [facility] [address] [place] [address]Phone: [phone]  Fax:  [phone] Name: Monica COLINMRN: [id number] of Birth: 512-28-1967

## 2019-08-29 ENCOUNTER — Other Ambulatory Visit: Payer: Self-pay

## 2019-08-29 ENCOUNTER — Ambulatory Visit: Payer: PRIVATE HEALTH INSURANCE

## 2019-08-29 DIAGNOSIS — R29898 Other symptoms and signs involving the musculoskeletal system: Secondary | ICD-10-CM

## 2019-08-29 DIAGNOSIS — M6281 Muscle weakness (generalized): Secondary | ICD-10-CM

## 2019-08-29 DIAGNOSIS — M25511 Pain in right shoulder: Secondary | ICD-10-CM | POA: Diagnosis not present

## 2019-08-29 DIAGNOSIS — M25611 Stiffness of right shoulder, not elsewhere classified: Secondary | ICD-10-CM

## 2019-08-29 NOTE — Therapy (Signed)
[facility] [address]  [place], [place], [address] Phone: [phone]   Fax:  [phone]  Physical Therapy Treatment  Patient Details  Name: Monica Petty MRN: [id number] Date of Birth: February 19, 1966 Referring Provider (PT): Sydnee Cabal, MD   Encounter Date: 08/29/2019  PT End of Session - 08/29/19 1500    Visit Number  22    Number of Visits  26    Date for PT Re-Evaluation  09/12/19    Authorization Type  WC- Approved for as many visits as necessary until 09/18/19    PT Start Time  1443    PT Stop Time  1525    PT Time Calculation (min)  42 min    Activity Tolerance  Patient tolerated treatment well    Behavior During Therapy  [facility] for tasks assessed/performed       Past Medical History:  Diagnosis Date  . Anxiety   . Diverticulosis of colon   . GERD (gastroesophageal reflux disease)   . History of colon polyps   . History of concussion    1987  fell of horse--- no residual  . Hypertension   . Migraine    occasional  . Rotator cuff tear    recurrent right side    Past Surgical History:  Procedure Laterality Date  . COLONOSCOPY  06/06/2016  . LAPAROSCOPIC CHOLECYSTECTOMY  06-03-2008   dr Ulyess Blossom  [facility]  . REDUCTION MAMMAPLASTY Bilateral 03-05-2002   dr barber  [facility]  . SHOULDER ARTHROSCOPY WITH ROTATOR CUFF REPAIR Right 05/23/2019   Procedure: SHOULDER ARTHROSCOPY WITH revision rotator cuff repair and  debirdement;  Surgeon: Sydnee Cabal, MD;  Location: [facility];  Service: Orthopedics;  Laterality: Right;  interscalene block  . SHOULDER ARTHROSCOPY WITH ROTATOR CUFF REPAIR AND SUBACROMIAL DECOMPRESSION Right 04/17/2018   Procedure: Right shoulder evaluation under anesthesia, scope, debridement, subacromial decompression, rotator cuff repair;  Surgeon: Sydnee Cabal, MD;  Location: [facility];  Service: Orthopedics;  Laterality: Right;  120 mins General with  Intra Scalene Block  . VAGINAL HYSTERECTOMY  02-12-2002   dr Nori Riis  [facility]  . WISDOM TOOTH EXTRACTION  teen    There were no vitals filed for this visit.  Subjective Assessment - 08/29/19 1458    Subjective  Pt. noting some soreness for a day after last visit which subsided last visit.    Pertinent History  migraine, HTN, concussion, GERD, anxiety    Diagnostic tests  none recent    Patient Stated Goals  get back to 100%    Currently in Pain?  No/denies    Pain Score  0-No pain   soreness 3/10 after last session   Pain Location  Shoulder    Pain Orientation  Right;Anterior;Posterior;Lateral    Pain Descriptors / Indicators  Aching    Multiple Pain Sites  No                       [facility] Adult PT Treatment/Exercise - 08/29/19 0001      Shoulder Exercises: Sidelying   External Rotation  Right;10 reps;Weights;Strengthening    External Rotation Weight (lbs)  2   1# first set; 2# second set     Shoulder Exercises: Standing   Horizontal ABduction  Right;10 reps;Strengthening;Theraband    Theraband Level (Shoulder Horizontal ABduction)  Level 2 (Red)    External Rotation  Right;12 reps;Theraband    Theraband Level (Shoulder External Rotation)  Level 2 (Red)    External Rotation Limitations  elbow in neutral     Internal Rotation  Right;12 reps;Theraband;Strengthening    Extension  Right;15 reps;Theraband;Strengthening    Theraband Level (Shoulder Extension)  Level 2 (Red)    Extension Limitations  into mild hyperextension       Shoulder Exercises: ROM/Strengthening   UBE (Upper Arm Bike)  L2.0 x 3 min forward/3 min back    Lat Pull  15 reps    Lat Pull Limitations  15# - narow grip     Cybex Press  20 reps    Cybex Press Limitations  Serratus punch; 20#    Cybex Row  15 reps    Cybex Row Limitations  15# - low row    Wall Pushups  15 reps    Wall Pushups Limitations  increased progression of resistance     Pushups Limitations  x 15 serratus punch on wall   more  advanced      Shoulder Exercises: Stretch   Other Shoulder Stretches  R posterior/inferior stretch 2 x 30 sec     Other Shoulder Stretches  R lats childs pose strech 2 x 20 sec              PT Education - 08/29/19 1801    Education Details  HEP update;  lats stretch    Person(s) Educated  Patient    Methods  Explanation;Demonstration;Handout;Verbal cues    Comprehension  Verbalized understanding;Returned demonstration;Verbal cues required       PT Short Term Goals - 07/18/19 1408      PT SHORT TERM GOAL #1   Title  Patient to be independent with initial HEP.    Time  3    Period  Weeks    Status  Achieved    Target Date  07/04/19        PT Long Term Goals - 08/22/19 1503      PT LONG TERM GOAL #1   Title  patient to be independent with advanced HEP    Time  8    Period  Weeks    Status  Partially Met   met for current     PT LONG TERM GOAL #2   Title  Patient to demonstrate R shoulder AROM/PROM WFL and without pain limiting.    Time  8    Period  Weeks    Status  Partially Met   08/22/19: improving R shoulder PROM and AROM     PT LONG TERM GOAL #3   Title  patient to improve R shoulder strength to >/= 4+/5 without pain    Time  8    Period  Weeks    Status  Partially Met   08/22/19: met for IR     PT LONG TERM GOAL #4   Title  Patient to demonstrate overhead reaching to place 5# object on shelf overhead with <=1/10 pain.    Time  8    Period  Weeks    Status  Partially Met   08/22/19: able to achive with 2# weight avoiding >2# lifting retriction     PT LONG TERM GOAL #5   Title  Patient to report tolerance of dressing and grooming activities without pain limiting.    Time  8    Period  Weeks    Status  Partially Met   08/22/19: occasionally with posterior shoulder "spasm" while fixing hair and altering her dressing pattern "right arm in first"  in order to avoid shoulder pain           Plan - 08/29/19 1502    Clinical Impression Statement   [name] reports some soreness at shoulder for one day after last session which subsided.  Progressed flexion, scaption strengthening today along with addition of lat pulldown machine without issue.  Noted R lats tightness today which was addressed with prone Child's Pose in session with good stretch noted thus updated HEP with this.  Ended visit with pt. noting she was pain free thus modalities deferred.  Pt. able to demo improved flexion, scaption range overhead with less scapular "hiking" present demonstrating improved scapulohumeral rhythm.    Comorbidities  migraine, HTN, concussion, GERD, anxiety    Rehab Potential  Good    PT Treatment/Interventions  ADLs/Self Care Home Management;Cryotherapy;Electrical Stimulation;Iontophoresis 72m/ml Dexamethasone;Moist Heat;Therapeutic exercise;Therapeutic activities;Functional mobility training;Ultrasound;Neuromuscular re-education;Patient/family education;Manual techniques;Vasopneumatic Device;Taping;Energy conservation;Dry needling;Passive range of motion;Scar mobilization    PT Next Visit Plan  progress per protocol- completed week 12 on 08/29/19    Consulted and Agree with Plan of Care  Patient       Patient will benefit from skilled therapeutic intervention in order to improve the following deficits and impairments:  Hypomobility, Increased edema, Decreased scar mobility, Decreased activity tolerance, Decreased strength, Impaired UE functional use, Pain, Improper body mechanics, Decreased range of motion, Impaired flexibility, Postural dysfunction  Visit Diagnosis: Acute pain of right shoulder  Stiffness of right shoulder, not elsewhere classified  Muscle weakness (generalized)  Other symptoms and signs involving the musculoskeletal system     Problem List Patient Active Problem List   Diagnosis Date Noted  . S/P arthroscopy of right shoulder 04/17/2018    MBess Harvest PTA 08/29/19 6:06 PM   [facility] [address] [place] [address]Phone: [phone]  Fax:  [phone] Name: Monica CHILCOTTMRN: [id number] of Birth: 5December 24, 1967

## 2019-08-30 MED FILL — PREMARIN 0.625 MG TABLET: 0.625 | 30 days supply | Qty: 30 | Fill #2

## 2019-08-30 MED FILL — LISINOPRIL 5 MG TABS: 5 | 90 days supply | Qty: 90 | Fill #0

## 2019-09-02 ENCOUNTER — Other Ambulatory Visit: Payer: Self-pay

## 2019-09-02 ENCOUNTER — Ambulatory Visit: Payer: PRIVATE HEALTH INSURANCE | Admitting: Physical Therapy

## 2019-09-02 ENCOUNTER — Encounter: Payer: Self-pay | Admitting: Physical Therapy

## 2019-09-02 DIAGNOSIS — M25611 Stiffness of right shoulder, not elsewhere classified: Secondary | ICD-10-CM

## 2019-09-02 DIAGNOSIS — M6281 Muscle weakness (generalized): Secondary | ICD-10-CM

## 2019-09-02 DIAGNOSIS — M25511 Pain in right shoulder: Secondary | ICD-10-CM

## 2019-09-02 DIAGNOSIS — R29898 Other symptoms and signs involving the musculoskeletal system: Secondary | ICD-10-CM

## 2019-09-02 NOTE — Therapy (Signed)
[facility] [address]  [place], [place], [address] Phone: [phone]   Fax:  [phone]  Physical Therapy Treatment  Patient Details  Name: Monica Petty MRN: [id number] Date of Birth: 03-12-1966 Referring Provider (PT): Sydnee Cabal, MD   Encounter Date: 09/02/2019  PT End of Session - 09/02/19 1611    Visit Number  23    Number of Visits  26    Date for PT Re-Evaluation  09/12/19    Authorization Type  WC- Approved for as many visits as necessary until 09/18/19    PT Start Time  1533    PT Stop Time  1611    PT Time Calculation (min)  38 min    Activity Tolerance  Patient tolerated treatment well    Behavior During Therapy  [facility] for tasks assessed/performed       Past Medical History:  Diagnosis Date  . Anxiety   . Diverticulosis of colon   . GERD (gastroesophageal reflux disease)   . History of colon polyps   . History of concussion    1987  fell of horse--- no residual  . Hypertension   . Migraine    occasional  . Rotator cuff tear    recurrent right side    Past Surgical History:  Procedure Laterality Date  . COLONOSCOPY  06/06/2016  . LAPAROSCOPIC CHOLECYSTECTOMY  06-03-2008   dr Ulyess Blossom  [facility]  . REDUCTION MAMMAPLASTY Bilateral 03-05-2002   dr barber  [facility]  . SHOULDER ARTHROSCOPY WITH ROTATOR CUFF REPAIR Right 05/23/2019   Procedure: SHOULDER ARTHROSCOPY WITH revision rotator cuff repair and  debirdement;  Surgeon: Sydnee Cabal, MD;  Location: [facility];  Service: Orthopedics;  Laterality: Right;  interscalene block  . SHOULDER ARTHROSCOPY WITH ROTATOR CUFF REPAIR AND SUBACROMIAL DECOMPRESSION Right 04/17/2018   Procedure: Right shoulder evaluation under anesthesia, scope, debridement, subacromial decompression, rotator cuff repair;  Surgeon: Sydnee Cabal, MD;  Location: [facility];  Service: Orthopedics;  Laterality: Right;  120 mins General with  Intra Scalene Block  . VAGINAL HYSTERECTOMY  02-12-2002   dr Nori Riis  [facility]  . WISDOM TOOTH EXTRACTION  teen    There were no vitals filed for this visit.  Subjective Assessment - 09/02/19 1535    Subjective  Has been using a standing desk at work recently. R shoulder gets a little sensitive with repetitive motions at work but it seems to go away. Reporting that she is unable to take time during her work day during this current job assigment to perofrm shoulder stretches.    Pertinent History  migraine, HTN, concussion, GERD, anxiety    Diagnostic tests  none recent    Patient Stated Goals  get back to 100%    Currently in Pain?  No/denies                       [facility] Adult PT Treatment/Exercise - 09/02/19 0001      Shoulder Exercises: Seated   Flexion  Strengthening;Right;10 reps;Weights    Flexion Weight (lbs)  1    Flexion Limitations  manual assistance of scapula to upwardly rotate   palpable "pop" over anterior shoulder   Abduction  Strengthening;Right;10 reps;Weights    ABduction Weight (lbs)  1    ABduction Limitations  to ~100 deg     Other Seated Exercises  R shoulder scaption ~100 deg with 1# x10      Shoulder  Exercises: Prone   Flexion  Both;10 reps;Strengthening    Flexion Weight (lbs)  1    Flexion Limitations  "I" prone over counter    Horizontal ABduction 1  Both;10 reps    Horizontal ABduction 1 Weight (lbs)  1    Horizontal ABduction 1 Limitations  "T" prone over counter    Other Prone Exercises  R UE "Y" prone over counter x10 with 1#      Shoulder Exercises: Standing   External Rotation  Strengthening;Right;10 reps;Theraband    Theraband Level (Shoulder External Rotation)  Level 3 (Green)    External Rotation Limitations  2x10; stopping at neutral    Internal Rotation  Right;Theraband;Strengthening;10 reps    Theraband Level (Shoulder Internal Rotation)  Level 3 (Green)    Internal Rotation Limitations  2x10; stopping at neutral    Row   Theraband;Strengthening;Both;10 reps    Theraband Level (Shoulder Row)  Level 2 (Red)    Row Limitations  2x10; row + ER   cues to supinate elbows     Shoulder Exercises: ROM/Strengthening   UBE (Upper Arm Bike)  L2.0 x 3 min forward/3 min back             PT Education - 09/02/19 1610    Education Details  Update to HEP- green TB for IR/ER, advised to raise past 90 deg with sitting flexion, scaption, abduction, and addition of 1# with I,T,Y    Person(s) Educated  Patient    Methods  Explanation;Demonstration;Tactile cues;Verbal cues;Handout    Comprehension  Returned demonstration;Verbalized understanding       PT Short Term Goals - 07/18/19 1408      PT SHORT TERM GOAL #1   Title  Patient to be independent with initial HEP.    Time  3    Period  Weeks    Status  Achieved    Target Date  07/04/19        PT Long Term Goals - 08/22/19 1503      PT LONG TERM GOAL #1   Title  patient to be independent with advanced HEP    Time  8    Period  Weeks    Status  Partially Met   met for current     PT LONG TERM GOAL #2   Title  Patient to demonstrate R shoulder AROM/PROM WFL and without pain limiting.    Time  8    Period  Weeks    Status  Partially Met   08/22/19: improving R shoulder PROM and AROM     PT LONG TERM GOAL #3   Title  patient to improve R shoulder strength to >/= 4+/5 without pain    Time  8    Period  Weeks    Status  Partially Met   08/22/19: met for IR     PT LONG TERM GOAL #4   Title  Patient to demonstrate overhead reaching to place 5# object on shelf overhead with <=1/10 pain.    Time  8    Period  Weeks    Status  Partially Met   08/22/19: able to achive with 2# weight avoiding >2# lifting retriction     PT LONG TERM GOAL #5   Title  Patient to report tolerance of dressing and grooming activities without pain limiting.    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 Did advise patient that it is important to speak with case manager, Patty about finding time to perform HEP or stretching during her work day to avoid stiffness and pain. Patient with palpable popping over R anterior shoulder with sitting flexion, however less so with scaption and abduction. Patient also demonstrating much improved ROM and ease of motion with these motions compared to forward flexion. Able to increase weight with I, T, Y exercises with good demonstration of form after cues to decrease speed and use of momentum. Able to increase banded resistance with shoulder rotation strengthening with good tolerance and form. Patient reported understanding of all HEP updates discussed today and with no complaints at end of session.    Comorbidities  migraine, HTN, concussion, GERD, anxiety    Rehab Potential  Good    PT Treatment/Interventions  ADLs/Self Care Home Management;Cryotherapy;Electrical Stimulation;Iontophoresis 89m/ml Dexamethasone;Moist Heat;Therapeutic exercise;Therapeutic activities;Functional mobility training;Ultrasound;Neuromuscular re-education;Patient/family education;Manual techniques;Vasopneumatic Device;Taping;Energy conservation;Dry needling;Passive range of motion;Scar mobilization    PT Next Visit Plan  progress per protocol- completed week 12 on 08/29/19    Consulted and Agree with Plan of Care  Patient       Patient will benefit from skilled therapeutic intervention in order to improve the following deficits and impairments:  Hypomobility, Increased edema, Decreased scar mobility, Decreased activity tolerance, Decreased strength, Impaired UE  functional use, Pain, Improper body mechanics, Decreased range of motion, Impaired flexibility, Postural dysfunction  Visit Diagnosis: Acute pain of right shoulder  Stiffness of right shoulder, not elsewhere classified  Muscle weakness (generalized)  Other symptoms and signs involving the musculoskeletal system     Problem List Patient Active Problem List   Diagnosis Date Noted  . S/P arthroscopy of right shoulder 04/17/2018     YJanene Harvey PT, DPT 09/02/19 4:15 PM   [facility] [address] [place] [address]Phone: [phone]  Fax:  [phone] Name: GRENUKA FARFANMRN: [id number] of Birth: [birth year]

## 2019-09-05 ENCOUNTER — Ambulatory Visit: Payer: PRIVATE HEALTH INSURANCE

## 2019-09-05 ENCOUNTER — Other Ambulatory Visit: Payer: Self-pay

## 2019-09-05 DIAGNOSIS — M25511 Pain in right shoulder: Secondary | ICD-10-CM | POA: Diagnosis not present

## 2019-09-05 DIAGNOSIS — R29898 Other symptoms and signs involving the musculoskeletal system: Secondary | ICD-10-CM

## 2019-09-05 DIAGNOSIS — M6281 Muscle weakness (generalized): Secondary | ICD-10-CM

## 2019-09-05 DIAGNOSIS — M25611 Stiffness of right shoulder, not elsewhere classified: Secondary | ICD-10-CM

## 2019-09-05 NOTE — Therapy (Signed)
[facility] [address]  [place], [place], [address] Phone: [phone]   Fax:  [phone]  Physical Therapy Treatment  Patient Details  Name: Monica Petty MRN: [id number] Date of Birth: 29-Apr-1966 Referring Provider (PT): Sydnee Cabal, MD   Encounter Date: 09/05/2019  PT End of Session - 09/05/19 1545    Visit Number  24    Number of Visits  26    Date for PT Re-Evaluation  09/12/19    Authorization Type  WC- Approved for as many visits as necessary until 09/18/19    PT Start Time  1536    PT Stop Time  1615    PT Time Calculation (min)  39 min    Activity Tolerance  Patient tolerated treatment well    Behavior During Therapy  [facility] for tasks assessed/performed       Past Medical History:  Diagnosis Date  . Anxiety   . Diverticulosis of colon   . GERD (gastroesophageal reflux disease)   . History of colon polyps   . History of concussion    1987  fell of horse--- no residual  . Hypertension   . Migraine    occasional  . Rotator cuff tear    recurrent right side    Past Surgical History:  Procedure Laterality Date  . COLONOSCOPY  06/06/2016  . LAPAROSCOPIC CHOLECYSTECTOMY  06-03-2008   dr Ulyess Blossom  [facility]  . REDUCTION MAMMAPLASTY Bilateral 03-05-2002   dr barber  [facility]  . SHOULDER ARTHROSCOPY WITH ROTATOR CUFF REPAIR Right 05/23/2019   Procedure: SHOULDER ARTHROSCOPY WITH revision rotator cuff repair and  debirdement;  Surgeon: Sydnee Cabal, MD;  Location: [facility];  Service: Orthopedics;  Laterality: Right;  interscalene block  . SHOULDER ARTHROSCOPY WITH ROTATOR CUFF REPAIR AND SUBACROMIAL DECOMPRESSION Right 04/17/2018   Procedure: Right shoulder evaluation under anesthesia, scope, debridement, subacromial decompression, rotator cuff repair;  Surgeon: Sydnee Cabal, MD;  Location: [facility];  Service: Orthopedics;  Laterality: Right;  120 mins General with  Intra Scalene Block  . VAGINAL HYSTERECTOMY  02-12-2002   dr Nori Riis  [facility]  . WISDOM TOOTH EXTRACTION  teen    There were no vitals filed for this visit.  Subjective Assessment - 09/05/19 1541    Subjective  Pt. sees MD on Monday.    Pertinent History  migraine, HTN, concussion, GERD, anxiety    Diagnostic tests  none recent    Patient Stated Goals  get back to 100%    Currently in Pain?  No/denies    Pain Score  0-No pain   "Catching pain" rising to 6/10 while turning stearing wheel "wrong" on Tuesday   Pain Location  Shoulder    Pain Orientation  Right    Pain Descriptors / Indicators  --   "catching" pain   Pain Type  Acute pain;Surgical pain    Pain Onset  More than a month ago    Pain Frequency  Constant    Multiple Pain Sites  No         [facility] PT Assessment - 09/05/19 0001      AROM   AROM Assessment Site  Shoulder    Right/Left Shoulder  Right    Right Shoulder Flexion  138 Degrees    Right Shoulder ABduction  146 Degrees    Right Shoulder Internal Rotation  --   FIR to base of strap    Right Shoulder External Rotation  --  C7     PROM   PROM Assessment Site  Shoulder    Right/Left Shoulder  Right    Right Shoulder Flexion  154 Degrees    Right Shoulder ABduction  142 Degrees    Right Shoulder Internal Rotation  80 Degrees    Right Shoulder External Rotation  65 Degrees      Strength   Right/Left Shoulder  Right;Left    Right Shoulder Flexion  4/5    Right Shoulder ABduction  4/5    Right Shoulder Internal Rotation  4+/5    Right Shoulder External Rotation  4/5    Left Shoulder Flexion  4+/5    Left Shoulder ABduction  4+/5    Left Shoulder Internal Rotation  5/5    Left Shoulder External Rotation  4+/5                   [facility] Adult PT Treatment/Exercise - 09/05/19 0001      Shoulder Exercises: Standing   Flexion  Both;10 reps    Shoulder Flexion Weight (lbs)  2    Flexion Limitations  leaning on wall     ABduction  Both;10 reps     Shoulder ABduction Weight (lbs)  2    ABduction Limitations  scaption leaning on wall       Shoulder Exercises: ROM/Strengthening   UBE (Upper Arm Bike)  L2.0 x 3 min forward/3 min back      Shoulder Exercises: Stretch   Other Shoulder Stretches  R posterior/inferior stretch 2 x 30 sec     Other Shoulder Stretches  R lats seated stretch with ball rollouts 2 x 30 sec    still with visible lats tightness               PT Short Term Goals - 07/18/19 1408      PT SHORT TERM GOAL #1   Title  Patient to be independent with initial HEP.    Time  3    Period  Weeks    Status  Achieved    Target Date  07/04/19        PT Long Term Goals - 09/05/19 1546      PT LONG TERM GOAL #1   Title  patient to be independent with advanced HEP    Time  8    Period  Weeks    Status  Partially Met   met for current     PT LONG TERM GOAL #2   Title  Patient to demonstrate R shoulder AROM/PROM WFL and without pain limiting.    Time  8    Period  Weeks    Status  Partially Met   09/05/19: improving R shoulder functional internal and external rotation AROM     PT LONG TERM GOAL #3   Title  patient to improve R shoulder strength to >/= 4+/5 without pain    Time  8    Period  Weeks    Status  Partially Met   09/05/19: met for IR     PT LONG TERM GOAL #4   Title  Patient to demonstrate overhead reaching to place 5# object on shelf overhead with <=1/10 pain.    Time  8    Period  Weeks    Status  Partially Met   09/05/19: 5# to 2nd shelf above head in cabinet however with R UT substitutions and pain rising to 2/10;  With visible R shoulder deltoid/RTC weakness remaining.  PT LONG TERM GOAL #5   Title  Patient to report tolerance of dressing and grooming activities without pain limiting.    Time  8    Period  Weeks    Status  Partially Met   09/05/19: Still altering her dressing pattern "right arm in first" in order to avoid shoulder pain           Plan - 09/05/19 1810     Clinical Impression Statement  Pt. has made good progress with therapy.  Grossly with "partially met" status for all LTGs demonstrating improved strength into R shoulder flexion and notable improvement in functional R shoulder internal rotation and external rotation over last few visits.  Still demonstrating remaining weakness into R shoulder flexion, scaption elevation motions evident with 2# raises to overhead shelf above cabinet today.  Scapulohumeral rhythm slowly improving evident by decreasing "shoulder hike" with elevation motions against gravity.  Notes she has been observing her 2# lifting restriction.  Pt. primary complaint seems to be remaining difficulty/discomfort dressing and fixing hair as she has had to modify the way in which she takes off shirts/coats and other daily activities.  LTG #5 partially achieved.  Pt. AROM and strength progressing toward LTG #2, #3 and will continue to progress elevation/RTC strengthening per pt. tolerance in coming sessions.  Pt. will continue to benefit from further skilled therapy to maximize functional ROM and strength for return to work-related and household tasks.    Comorbidities  migraine, HTN, concussion, GERD, anxiety    Rehab Potential  Good    PT Treatment/Interventions  ADLs/Self Care Home Management;Cryotherapy;Electrical Stimulation;Iontophoresis 17m/ml Dexamethasone;Moist Heat;Therapeutic exercise;Therapeutic activities;Functional mobility training;Ultrasound;Neuromuscular re-education;Patient/family education;Manual techniques;Vasopneumatic Device;Taping;Energy conservation;Dry needling;Passive range of motion;Scar mobilization    PT Next Visit Plan  progress per protocol- completed week 12 on 12/117/20    Consulted and Agree with Plan of Care  Patient       Patient will benefit from skilled therapeutic intervention in order to improve the following deficits and impairments:  Hypomobility, Increased edema, Decreased scar mobility, Decreased  activity tolerance, Decreased strength, Impaired UE functional use, Pain, Improper body mechanics, Decreased range of motion, Impaired flexibility, Postural dysfunction  Visit Diagnosis: Acute pain of right shoulder  Stiffness of right shoulder, not elsewhere classified  Muscle weakness (generalized)  Other symptoms and signs involving the musculoskeletal system     Problem List Patient Active Problem List   Diagnosis Date Noted  . S/P arthroscopy of right shoulder 04/17/2018    MBess Harvest PTA 09/05/19 6:19 PM    [facility] [address] [place] [address]Phone: [phone]  Fax:  [phone] Name: Monica CAPPSMRN: [id number] of Birth: 517-Mar-1967

## 2019-09-06 DIAGNOSIS — Z20828 Contact with and (suspected) exposure to other viral communicable diseases: Secondary | ICD-10-CM | POA: Diagnosis not present

## 2019-09-06 DIAGNOSIS — R070 Pain in throat: Secondary | ICD-10-CM | POA: Diagnosis not present

## 2019-09-06 DIAGNOSIS — R509 Fever, unspecified: Secondary | ICD-10-CM | POA: Diagnosis not present

## 2019-09-10 ENCOUNTER — Encounter: Payer: Self-pay | Admitting: Physical Therapy

## 2019-09-10 ENCOUNTER — Other Ambulatory Visit: Payer: Self-pay

## 2019-09-10 ENCOUNTER — Ambulatory Visit: Payer: PRIVATE HEALTH INSURANCE | Admitting: Physical Therapy

## 2019-09-10 DIAGNOSIS — R29898 Other symptoms and signs involving the musculoskeletal system: Secondary | ICD-10-CM

## 2019-09-10 DIAGNOSIS — M25511 Pain in right shoulder: Secondary | ICD-10-CM

## 2019-09-10 DIAGNOSIS — M6281 Muscle weakness (generalized): Secondary | ICD-10-CM

## 2019-09-10 DIAGNOSIS — M25611 Stiffness of right shoulder, not elsewhere classified: Secondary | ICD-10-CM

## 2019-09-10 NOTE — Therapy (Signed)
[facility] [address]  [place], [place], [address] Phone: [phone]   Fax:  [phone]  Physical Therapy Treatment  Patient Details  Name: Monica Monica MRN: [id number] Date of Birth: [birth year] Referring Provider (PT): Sydnee Cabal, MD   Encounter Date: 09/10/2019  PT End of Session - 09/10/19 1631    Visit Number  25    Number of Visits  37    Date for PT Re-Evaluation  10/22/19    Authorization Type  WC- 12 additional visits    Authorization - Visit Number  0    Authorization - Number of Visits  12    PT Start Time  1454   pt late   PT Stop Time  1530    PT Time Calculation (min)  36 min    Activity Tolerance  Patient tolerated treatment well    Behavior During Therapy  [facility] for tasks assessed/performed       Past Medical History:  Diagnosis Date  . Anxiety   . Diverticulosis of colon   . GERD (gastroesophageal reflux disease)   . History of colon polyps   . History of concussion    1987  fell of horse--- no residual  . Hypertension   . Migraine    occasional  . Rotator cuff tear    recurrent right side    Past Surgical History:  Procedure Laterality Date  . COLONOSCOPY  06/06/2016  . LAPAROSCOPIC CHOLECYSTECTOMY  06-03-2008   dr Ulyess Blossom  [facility]  . REDUCTION MAMMAPLASTY Bilateral 03-05-2002   dr barber  [facility]  . SHOULDER ARTHROSCOPY WITH ROTATOR CUFF REPAIR Right 05/23/2019   Procedure: SHOULDER ARTHROSCOPY WITH revision rotator cuff repair and  debirdement;  Surgeon: Sydnee Cabal, MD;  Location: [facility];  Service: Orthopedics;  Laterality: Right;  interscalene block  . SHOULDER ARTHROSCOPY WITH ROTATOR CUFF REPAIR AND SUBACROMIAL DECOMPRESSION Right 04/17/2018   Procedure: Right shoulder evaluation under anesthesia, scope, debridement, subacromial decompression, rotator cuff repair;  Surgeon: Sydnee Cabal, MD;  Location: [facility];  Service:  Orthopedics;  Laterality: Right;  120 mins General with Intra Scalene Block  . VAGINAL HYSTERECTOMY  02-12-2002   dr Nori Riis  [facility]  . WISDOM TOOTH EXTRACTION  teen    There were no vitals filed for this visit.  Subjective Assessment - 09/10/19 1456    Subjective  Saw her PA who advised her to continue with PT- sees her again on 10/20/18.    Diagnostic tests  none recent    Patient Stated Goals  get back to 100%    Currently in Pain?  No/denies         [facility] PT Assessment - 09/10/19 0001      Assessment   Medical Diagnosis  F/U orthopedic assessment, s/p R RTC revision and debridement     Referring Provider (PT)  Sydnee Cabal, MD    Onset Date/Surgical Date  05/23/19      Observation/Other Assessments   Focus on Therapeutic Outcomes (FOTO)   52% (48 limitation)      AROM   Right Shoulder Flexion  138 Degrees    Right Shoulder ABduction  146 Degrees    Right Shoulder Internal Rotation  --   FIR to base of strap   Right Shoulder External Rotation  --   C7     PROM   Right Shoulder Flexion  154 Degrees    Right Shoulder ABduction  142  Degrees    Right Shoulder Internal Rotation  80 Degrees    Right Shoulder External Rotation  65 Degrees      Strength   Right Shoulder Flexion  4/5    Right Shoulder ABduction  4/5    Right Shoulder Internal Rotation  4+/5    Right Shoulder External Rotation  4/5    Left Shoulder Flexion  4+/5    Left Shoulder ABduction  4+/5    Left Shoulder Internal Rotation  5/5    Left Shoulder External Rotation  4+/5                   [facility] Adult PT Treatment/Exercise - 09/10/19 0001      Shoulder Exercises: Seated   Horizontal ABduction  Strengthening;Both;10 reps;Theraband    Theraband Level (Shoulder Horizontal ABduction)  Level 3 (Green)    Horizontal ABduction Limitations  cues to relax shoulder down    External Rotation  Strengthening;Both;10 reps;Theraband    Theraband Level (Shoulder External Rotation)  Level 3 (Green)     External Rotation Limitations  limited ROM on R      Shoulder Exercises: Prone   External Rotation  Strengthening;Right;10 reps    External Rotation Limitations  Prone "w" over counter      Shoulder Exercises: Standing   Extension  Right;10 reps    Extension Limitations  AAROM with wand; 10x3"    Other Standing Exercises  B Y lift offs from wall x10 to tolerance      Shoulder Exercises: ROM/Strengthening   UBE (Upper Arm Bike)  L2.0 x 3 min forward/3 min back      Shoulder Exercises: Stretch   Internal Rotation Stretch  5 reps    Internal Rotation Stretch Limitations  R UE 5x5" to tolerance with strap    External Rotation Stretch  5 reps   R UE 5x5" to tolerance with strap   Other Shoulder Stretches  R shoulder ER stretch against wall 20" to tolerance             PT Education - 09/10/19 1631    Education Details  update/consolidation of HEP    Person(s) Educated  Patient    Methods  Explanation;Demonstration;Tactile cues;Verbal cues;Handout    Comprehension  Verbalized understanding;Returned demonstration       PT Short Term Goals - 09/10/19 1503      PT SHORT TERM GOAL #1   Title  Patient to be independent with initial HEP.    Time  3    Period  Weeks    Status  Achieved    Target Date  07/04/19        PT Long Term Goals - 09/10/19 1503      PT LONG TERM GOAL #1   Title  patient to be independent with advanced HEP    Time  6    Period  Weeks    Status  Partially Met   met for current   Target Date  10/22/19      PT LONG TERM GOAL #2   Title  Patient to demonstrate R shoulder AROM/PROM [facility] and without pain limiting.    Time  6    Period  Weeks    Status  Partially Met   09/05/19: improving R shoulder functional internal and external rotation AROM   Target Date  10/22/19      PT LONG TERM GOAL #3   Title  patient to improve R shoulder strength to >/= 4+/5  without pain    Time  6    Period  Weeks    Status  Partially Met   09/05/19: met for IR    Target Date  10/22/19      PT LONG TERM GOAL #4   Title  Patient to demonstrate overhead reaching to place 5# object on shelf overhead with <=1/10 pain.    Time  6    Period  Weeks    Status  Partially Met   09/05/19: 5# to 2nd shelf above head in cabinet however with R UT substitutions and pain rising to 2/10;  With visible R shoulder deltoid/RTC weakness remaining.   Target Date  10/22/19      PT LONG TERM GOAL #5   Title  Patient to report tolerance of dressing and grooming activities without pain limiting.    Time  6    Period  Weeks    Status  Partially Met   09/05/19: Still altering her dressing pattern "right arm in first" in order to avoid shoulder pain   Target Date  10/22/19            Plan - 09/10/19 1633    Clinical Impression Statement  Patient reporting that she recently saw her PA, who advised her to continue with PT. Measurements not taken today as they were taken on 09/05/19- see objective measurements. Overall, patient is making slow but steady improvement in R shoulder ROM, strength, and functional activity tolerance. Patient is still unable to don/doff a bra without assistance, thus working on gentle IR and extension stretching today. Patient able to tolerate these well, thus updated them into HEP. Patient most limited in ER strength and ROM at this time, thus introduced B shoulder ER with banded resistance. Patient did report more difficulty with this exercises compared to banded ER secured in the door, thus this change was made to the HEP. Patient reported understanding of all edu provided today and declined modalities at end of session. Patient is demonstrating progress towards goals. Would benefit from continued skilled PT services 2x/week for 6 weeks to address remaining goals and return to work.    Comorbidities  migraine, HTN, concussion, GERD, anxiety    Rehab Potential  Good    PT Treatment/Interventions  ADLs/Self Care Home Management;Cryotherapy;Electrical  Stimulation;Iontophoresis 82m/ml Dexamethasone;Moist Heat;Therapeutic exercise;Therapeutic activities;Functional mobility training;Ultrasound;Neuromuscular re-education;Patient/family education;Manual techniques;Vasopneumatic Device;Taping;Energy conservation;Dry needling;Passive range of motion;Scar mobilization    PT Next Visit Plan  progress per protocol- completed week 12 on 12/117/20; continue gentle behind the back stretching and shoulder strength    Consulted and Agree with Plan of Care  Patient       Patient will benefit from skilled therapeutic intervention in order to improve the following deficits and impairments:  Hypomobility, Increased edema, Decreased scar mobility, Decreased activity tolerance, Decreased strength, Impaired UE functional use, Pain, Improper body mechanics, Decreased range of motion, Impaired flexibility, Postural dysfunction  Visit Diagnosis: Acute pain of right shoulder  Stiffness of right shoulder, not elsewhere classified  Muscle weakness (generalized)  Other symptoms and signs involving the musculoskeletal system     Problem List Patient Active Problem List   Diagnosis Date Noted  . S/P arthroscopy of right shoulder 04/17/2018     YJanene Harvey PT, DPT 09/10/19 4:41 PM   [facility] [place] [address]Phone: [phone]  Fax:  [phone] Name: Monica MONICAMRN: [id number] of Birth: 512/17/1967

## 2019-09-18 ENCOUNTER — Other Ambulatory Visit: Payer: Self-pay

## 2019-09-18 ENCOUNTER — Ambulatory Visit: Payer: PRIVATE HEALTH INSURANCE

## 2019-09-18 DIAGNOSIS — M25611 Stiffness of right shoulder, not elsewhere classified: Secondary | ICD-10-CM

## 2019-09-18 DIAGNOSIS — R29898 Other symptoms and signs involving the musculoskeletal system: Secondary | ICD-10-CM

## 2019-09-18 DIAGNOSIS — M6281 Muscle weakness (generalized): Secondary | ICD-10-CM

## 2019-09-18 DIAGNOSIS — M25511 Pain in right shoulder: Secondary | ICD-10-CM

## 2019-09-18 NOTE — Therapy (Signed)
[facility] [address]  [place], [place], [address] Phone: [phone]   Fax:  [phone]  Physical Therapy Treatment  Patient Details  Name: Monica Petty MRN: [id number] Date of Birth: 01-05-1966 Referring Provider (PT): Sydnee Cabal, MD   Encounter Date: 09/18/2019  PT End of Session - 09/18/19 1451    Visit Number  26    Number of Visits  23    Date for PT Re-Evaluation  10/22/19    Authorization Type  WC- 12 additional visits    Authorization - Visit Number  1    Authorization - Number of Visits  12    PT Start Time  1761    PT Stop Time  1530    PT Time Calculation (min)  44 min    Activity Tolerance  Patient tolerated treatment well    Behavior During Therapy  [facility] for tasks assessed/performed       Past Medical History:  Diagnosis Date  . Anxiety   . Diverticulosis of colon   . GERD (gastroesophageal reflux disease)   . History of colon polyps   . History of concussion    1987  fell of horse--- no residual  . Hypertension   . Migraine    occasional  . Rotator cuff tear    recurrent right side    Past Surgical History:  Procedure Laterality Date  . COLONOSCOPY  06/06/2016  . LAPAROSCOPIC CHOLECYSTECTOMY  06-03-2008   dr Ulyess Blossom  [facility]  . REDUCTION MAMMAPLASTY Bilateral 03-05-2002   dr barber  [facility]  . SHOULDER ARTHROSCOPY WITH ROTATOR CUFF REPAIR Right 05/23/2019   Procedure: SHOULDER ARTHROSCOPY WITH revision rotator cuff repair and  debirdement;  Surgeon: Sydnee Cabal, MD;  Location: [facility];  Service: Orthopedics;  Laterality: Right;  interscalene block  . SHOULDER ARTHROSCOPY WITH ROTATOR CUFF REPAIR AND SUBACROMIAL DECOMPRESSION Right 04/17/2018   Procedure: Right shoulder evaluation under anesthesia, scope, debridement, subacromial decompression, rotator cuff repair;  Surgeon: Sydnee Cabal, MD;  Location: [facility];  Service: Orthopedics;   Laterality: Right;  120 mins General with Intra Scalene Block  . VAGINAL HYSTERECTOMY  02-12-2002   dr Nori Riis  [facility]  . WISDOM TOOTH EXTRACTION  teen    There were no vitals filed for this visit.  Subjective Assessment - 09/18/19 1449    Subjective  Pt. noting stressful job situation.    Pertinent History  migraine, HTN, concussion, GERD, anxiety    Diagnostic tests  none recent    Patient Stated Goals  get back to 100%    Currently in Pain?  No/denies    Pain Score  0-No pain    Pain Location  Shoulder    Pain Orientation  Right         [facility] PT Assessment - 09/18/19 0001      Assessment   Medical Diagnosis  F/U orthopedic assessment, s/p R RTC revision and debridement     Referring Provider (PT)  Sydnee Cabal, MD    Onset Date/Surgical Date  05/23/19    Hand Dominance  Right    Next MD Visit  10/21/19      AROM   AROM Assessment Site  Shoulder    Right/Left Shoulder  Right    Right Shoulder Flexion  144 Degrees    Right Shoulder ABduction  144 Degrees    Right Shoulder Internal Rotation  --   FIR to 1" above strap  Right Shoulder External Rotation  --   FER to C6                  [facility] Adult PT Treatment/Exercise - 09/18/19 0001      Shoulder Exercises: Supine   Protraction  Both;20 reps    Protraction Weight (lbs)  4    Protraction Limitations  serratus punch      Shoulder Exercises: Sidelying   External Rotation  Right;15 reps;Strengthening;Weights    External Rotation Weight (lbs)  2      Shoulder Exercises: Standing   Flexion  Both;12 reps;Strengthening;Weights    Shoulder Flexion Weight (lbs)  2    Flexion Limitations  leaning on wall     ABduction  Both;12 reps;Strengthening;Weights    Shoulder ABduction Weight (lbs)  2     ABduction Limitations  scaption/abd; leaning on wall       Shoulder Exercises: ROM/Strengthening   UBE (Upper Arm Bike)  L2.0 x 3 min forward/3 min back    Cybex Row  15 reps   2 sets    Cybex Row Limitations  20# -  low row       Shoulder Exercises: IT sales professional  30 seconds;3 reps    Corner Stretch Limitations  low and mid doorway stretch     Internal Rotation Stretch Limitations  5" x 10 reps behind back     Other Shoulder Stretches  Seated R lats stretch with green p-ball rollouts 2 x 30 sec       Manual Therapy   Manual Therapy  Soft tissue mobilization;Passive ROM;Muscle Energy Technique    Soft tissue mobilization  STM/DTM to R anterior shoulder and pec     Passive ROM  R shoulder pec stretch in varying positions with therapist     Muscle Energy Technique  R shoulder MET for ER, flexion                PT Short Term Goals - 09/10/19 1503      PT SHORT TERM GOAL #1   Title  Patient to be independent with initial HEP.    Time  3    Period  Weeks    Status  Achieved    Target Date  07/04/19        PT Long Term Goals - 09/10/19 1503      PT LONG TERM GOAL #1   Title  patient to be independent with advanced HEP    Time  6    Period  Weeks    Status  Partially Met   met for current   Target Date  10/22/19      PT LONG TERM GOAL #2   Title  Patient to demonstrate R shoulder AROM/PROM [facility] and without pain limiting.    Time  6    Period  Weeks    Status  Partially Met   09/05/19: improving R shoulder functional internal and external rotation AROM   Target Date  10/22/19      PT LONG TERM GOAL #3   Title  patient to improve R shoulder strength to >/= 4+/5 without pain    Time  6    Period  Weeks    Status  Partially Met   09/05/19: met for IR   Target Date  10/22/19      PT LONG TERM GOAL #4   Title  Patient to demonstrate overhead reaching to place 5# object on shelf overhead  with <=1/10 pain.    Time  6    Period  Weeks    Status  Partially Met   09/05/19: 5# to 2nd shelf above head in cabinet however with R UT substitutions and pain rising to 2/10;  With visible R shoulder deltoid/RTC weakness remaining.   Target Date  10/22/19      PT LONG TERM GOAL  #5   Title  Patient to report tolerance of dressing and grooming activities without pain limiting.    Time  6    Period  Weeks    Status  Partially Met   09/05/19: Still altering her dressing pattern "right arm in first" in order to avoid shoulder pain   Target Date  10/22/19            Plan - 09/18/19 1456    Clinical Impression Statement  Pt. doing well today.  Able to demo reduced scapular "hiking" with elevation activities today.  Demonstrating improved R shoulder AROM IR and ER following MT today (see flowsheet).  Progressing well toward goals.    Comorbidities  migraine, HTN, concussion, GERD, anxiety    Rehab Potential  Good    PT Treatment/Interventions  ADLs/Self Care Home Management;Cryotherapy;Electrical Stimulation;Iontophoresis 78m/ml Dexamethasone;Moist Heat;Therapeutic exercise;Therapeutic activities;Functional mobility training;Ultrasound;Neuromuscular re-education;Patient/family education;Manual techniques;Vasopneumatic Device;Taping;Energy conservation;Dry needling;Passive range of motion;Scar mobilization    PT Next Visit Plan  progress per protocol- completed week 12 on 09/19/19; continue gentle behind the back stretching and shoulder strength    Consulted and Agree with Plan of Care  Patient       Patient will benefit from skilled therapeutic intervention in order to improve the following deficits and impairments:  Hypomobility, Increased edema, Decreased scar mobility, Decreased activity tolerance, Decreased strength, Impaired UE functional use, Pain, Improper body mechanics, Decreased range of motion, Impaired flexibility, Postural dysfunction  Visit Diagnosis: Acute pain of right shoulder  Stiffness of right shoulder, not elsewhere classified  Muscle weakness (generalized)  Other symptoms and signs involving the musculoskeletal system     Problem List Patient Active Problem List   Diagnosis Date Noted  . S/P arthroscopy of right shoulder 04/17/2018     MBess Harvest PTA 09/18/19 5:31 PM   [facility] [place] [address]Phone: [phone]  Fax:  [phone] Name: GTYNLEE BAYLEMRN: [id number] of Birth: [birth year]

## 2019-09-24 ENCOUNTER — Ambulatory Visit: Payer: PRIVATE HEALTH INSURANCE | Attending: Specialist | Admitting: Physical Therapy

## 2019-09-24 ENCOUNTER — Encounter: Payer: Self-pay | Admitting: Physical Therapy

## 2019-09-24 ENCOUNTER — Other Ambulatory Visit: Payer: Self-pay

## 2019-09-24 DIAGNOSIS — M25611 Stiffness of right shoulder, not elsewhere classified: Secondary | ICD-10-CM | POA: Insufficient documentation

## 2019-09-24 DIAGNOSIS — M25511 Pain in right shoulder: Secondary | ICD-10-CM | POA: Diagnosis present

## 2019-09-24 DIAGNOSIS — M6281 Muscle weakness (generalized): Secondary | ICD-10-CM | POA: Diagnosis present

## 2019-09-24 DIAGNOSIS — R29898 Other symptoms and signs involving the musculoskeletal system: Secondary | ICD-10-CM | POA: Diagnosis present

## 2019-09-24 NOTE — Therapy (Signed)
[facility] [address]  [place], [place], [address] Phone: [phone]   Fax:  [phone]  Physical Therapy Treatment  Patient Details  Name: Monica Petty MRN: [id number] Date of Birth: June 06, 1966 Referring Provider (PT): Sydnee Cabal, MD   Encounter Date: 09/24/2019  PT End of Session - 09/24/19 1528    Visit Number  27    Number of Visits  37    Date for PT Re-Evaluation  10/22/19    Authorization Type  WC- 12 additional visits    Authorization - Visit Number  2    Authorization - Number of Visits  12    PT Start Time  0355    PT Stop Time  1527    PT Time Calculation (min)  44 min    Activity Tolerance  Patient tolerated treatment well;Patient limited by pain    Behavior During Therapy  [facility] for tasks assessed/performed       Past Medical History:  Diagnosis Date  . Anxiety   . Diverticulosis of colon   . GERD (gastroesophageal reflux disease)   . History of colon polyps   . History of concussion    1987  fell of horse--- no residual  . Hypertension   . Migraine    occasional  . Rotator cuff tear    recurrent right side    Past Surgical History:  Procedure Laterality Date  . COLONOSCOPY  06/06/2016  . LAPAROSCOPIC CHOLECYSTECTOMY  06-03-2008   dr Ulyess Blossom  [facility]  . REDUCTION MAMMAPLASTY Bilateral 03-05-2002   dr barber  [facility]  . SHOULDER ARTHROSCOPY WITH ROTATOR CUFF REPAIR Right 05/23/2019   Procedure: SHOULDER ARTHROSCOPY WITH revision rotator cuff repair and  debirdement;  Surgeon: Sydnee Cabal, MD;  Location: [facility];  Service: Orthopedics;  Laterality: Right;  interscalene block  . SHOULDER ARTHROSCOPY WITH ROTATOR CUFF REPAIR AND SUBACROMIAL DECOMPRESSION Right 04/17/2018   Procedure: Right shoulder evaluation under anesthesia, scope, debridement, subacromial decompression, rotator cuff repair;  Surgeon: Sydnee Cabal, MD;  Location: [facility];   Service: Orthopedics;  Laterality: Right;  120 mins General with Intra Scalene Block  . VAGINAL HYSTERECTOMY  02-12-2002   dr Nori Riis  [facility]  . WISDOM TOOTH EXTRACTION  teen    There were no vitals filed for this visit.  Subjective Assessment - 09/24/19 1444    Subjective  Feels that it has helped to be able to stretch and perform HEP intermittently at work. Overhead lifting/raising type activities are still difficult and is still having the sensation of moving/popping in the shoulder.    Pertinent History  migraine, HTN, concussion, GERD, anxiety    Diagnostic tests  none recent    Patient Stated Goals  get back to 100%    Currently in Pain?  No/denies                       [facility] Adult PT Treatment/Exercise - 09/24/19 0001      Shoulder Exercises: Prone   Flexion  10 reps;Strengthening;Right    Flexion Weight (lbs)  2    Flexion Limitations  "I" prone over counter    External Rotation  Strengthening;Right;10 reps    External Rotation Weight (lbs)  0, 1    External Rotation Limitations  2x10; Prone "w" over counter   report of pain & popping in R shoulder- discontinued   Horizontal ABduction 1  Both;10 reps  Horizontal ABduction 1 Weight (lbs)  2    Horizontal ABduction 1 Limitations  "T" prone over counter   cues to slow down   Other Prone Exercises  R UE "Y" prone over counter x10 with 2#   cues to slow speed; c/o popping in anterior shoulder     Shoulder Exercises: Standing   Internal Rotation  AROM;Right;10 reps    Internal Rotation Limitations  R shoulder IR AROM with shoulder at 90 deg abduction    to tolerance   Other Standing Exercises  B shoulder IR with cane x10 to tolerance      Shoulder Exercises: ROM/Strengthening   UBE (Upper Arm Bike)  L2.3 x 3 min forward/3 min back      Shoulder Exercises: Stretch   Wall Stretch - Flexion Limitations  vertical plank against wall with UE steps up/down the wall x10   cues to maintain spine in neutrall   Other  Shoulder Stretches  R biceps stretch at wall 3x20"    Other Shoulder Stretches  wall push up plus with green TB x10      Manual Therapy   Manual Therapy  Soft tissue mobilization;Myofascial release    Soft tissue mobilization  STM to R biceps muscle belly and proximal tendon- report of tenderness and palpable trigger point at mid length of muscle belly    Myofascial Release  TPR to R biceps muscle belly- palpable tender trigger pt             PT Education - 09/24/19 1527    Education Details  update to HEP; advised to add 2lbs to I, T, Y    Person(s) Educated  Patient    Methods  Explanation;Demonstration;Tactile cues;Verbal cues;Handout    Comprehension  Verbalized understanding;Returned demonstration       PT Short Term Goals - 09/10/19 1503      PT SHORT TERM GOAL #1   Title  Patient to be independent with initial HEP.    Time  3    Period  Weeks    Status  Achieved    Target Date  07/04/19        PT Long Term Goals - 09/10/19 1503      PT LONG TERM GOAL #1   Title  patient to be independent with advanced HEP    Time  6    Period  Weeks    Status  Partially Met   met for current   Target Date  10/22/19      PT LONG TERM GOAL #2   Title  Patient to demonstrate R shoulder AROM/PROM [facility] and without pain limiting.    Time  6    Period  Weeks    Status  Partially Met   09/05/19: improving R shoulder functional internal and external rotation AROM   Target Date  10/22/19      PT LONG TERM GOAL #3   Title  patient to improve R shoulder strength to >/= 4+/5 without pain    Time  6    Period  Weeks    Status  Partially Met   09/05/19: met for IR   Target Date  10/22/19      PT LONG TERM GOAL #4   Title  Patient to demonstrate overhead reaching to place 5# object on shelf overhead with <=1/10 pain.    Time  6    Period  Weeks    Status  Partially Met   09/05/19: 5# to 2nd  shelf above head in cabinet however with R UT substitutions and pain rising to 2/10;   With visible R shoulder deltoid/RTC weakness remaining.   Target Date  10/22/19      PT LONG TERM GOAL #5   Title  Patient to report tolerance of dressing and grooming activities without pain limiting.    Time  6    Period  Weeks    Status  Partially Met   09/05/19: Still altering her dressing pattern "right arm in first" in order to avoid shoulder pain   Target Date  10/22/19            Plan - 09/24/19 1528    Clinical Impression Statement  Patient noting improvement in comfort at work d/t being able to perform her HEP intermittently. Still notes that overhead reaching activities are difficult and still has the sensation of "movement" or popping in the shoulder. Patient performed periscapular strengthening with good tolerance of added weight with I, T, Y exercise, but still with discomfort with "W's," thus this was discontinued. Patient still with sensation of "popping" in R anterior shoulder over location of proximal biceps tendon with prone Y's as well as other ther-ex. Addressed this spot with STM and TPR to R biceps muscle belly and proximal tendon-  patient with report of tenderness and palpable trigger point at mid length of muscle belly. Followed with biceps stretch, which patient tolerated well. Updated HEP with this exercise- patient reported understanding. No complaints at end of session.    Comorbidities  migraine, HTN, concussion, GERD, anxiety    Rehab Potential  Good    PT Treatment/Interventions  ADLs/Self Care Home Management;Cryotherapy;Electrical Stimulation;Iontophoresis 34m/ml Dexamethasone;Moist Heat;Therapeutic exercise;Therapeutic activities;Functional mobility training;Ultrasound;Neuromuscular re-education;Patient/family education;Manual techniques;Vasopneumatic Device;Taping;Energy conservation;Dry needling;Passive range of motion;Scar mobilization    PT Next Visit Plan  progress per protocol- completed week 12 on 09/19/19; continue gentle behind the back stretching  and shoulder strength    Consulted and Agree with Plan of Care  Patient       Patient will benefit from skilled therapeutic intervention in order to improve the following deficits and impairments:  Hypomobility, Increased edema, Decreased scar mobility, Decreased activity tolerance, Decreased strength, Impaired UE functional use, Pain, Improper body mechanics, Decreased range of motion, Impaired flexibility, Postural dysfunction  Visit Diagnosis: Acute pain of right shoulder  Stiffness of right shoulder, not elsewhere classified  Muscle weakness (generalized)  Other symptoms and signs involving the musculoskeletal system     Problem List Patient Active Problem List   Diagnosis Date Noted  . S/P arthroscopy of right shoulder 04/17/2018    YJanene Harvey PT, DPT 09/24/19 4:28 PM   [facility] [address] [place] [address]Phone: [phone]  Fax:  [phone] Name: Monica PINCHMRN: [id number] of Birth: 509/04/1966

## 2019-09-30 MED FILL — PREMARIN 0.625 MG TABLET: 0.625 | 30 days supply | Qty: 30 | Fill #3

## 2019-10-01 ENCOUNTER — Encounter: Payer: Self-pay | Admitting: Physical Therapy

## 2019-10-01 ENCOUNTER — Other Ambulatory Visit: Payer: Self-pay

## 2019-10-01 ENCOUNTER — Ambulatory Visit: Payer: PRIVATE HEALTH INSURANCE | Admitting: Physical Therapy

## 2019-10-01 DIAGNOSIS — M25611 Stiffness of right shoulder, not elsewhere classified: Secondary | ICD-10-CM

## 2019-10-01 DIAGNOSIS — M25511 Pain in right shoulder: Secondary | ICD-10-CM | POA: Diagnosis not present

## 2019-10-01 DIAGNOSIS — M6281 Muscle weakness (generalized): Secondary | ICD-10-CM

## 2019-10-01 DIAGNOSIS — R29898 Other symptoms and signs involving the musculoskeletal system: Secondary | ICD-10-CM

## 2019-10-01 NOTE — Therapy (Signed)
[facility] [address]  [place], [place], [address] Phone: [phone]   Fax:  [phone]  Physical Therapy Treatment  Patient Details  Name: Monica Petty MRN: [id number] Date of Birth: [birth year] Referring Provider (PT): Sydnee Cabal, MD   Encounter Date: 10/01/2019  PT End of Session - 10/01/19 1639    Visit Number  28    Number of Visits  37    Date for PT Re-Evaluation  10/22/19    Authorization Type  WC- 12 additional visits    Authorization - Visit Number  3    Authorization - Number of Visits  12    PT Start Time  4818    PT Stop Time  1526    PT Time Calculation (min)  40 min    Activity Tolerance  Patient tolerated treatment well    Behavior During Therapy  [facility] for tasks assessed/performed       Past Medical History:  Diagnosis Date  . Anxiety   . Diverticulosis of colon   . GERD (gastroesophageal reflux disease)   . History of colon polyps   . History of concussion    1987  fell of horse--- no residual  . Hypertension   . Migraine    occasional  . Rotator cuff tear    recurrent right side    Past Surgical History:  Procedure Laterality Date  . COLONOSCOPY  06/06/2016  . LAPAROSCOPIC CHOLECYSTECTOMY  06-03-2008   dr Ulyess Blossom  [facility]  . REDUCTION MAMMAPLASTY Bilateral 03-05-2002   dr barber  [facility]  . SHOULDER ARTHROSCOPY WITH ROTATOR CUFF REPAIR Right 05/23/2019   Procedure: SHOULDER ARTHROSCOPY WITH revision rotator cuff repair and  debirdement;  Surgeon: Sydnee Cabal, MD;  Location: [facility];  Service: Orthopedics;  Laterality: Right;  interscalene block  . SHOULDER ARTHROSCOPY WITH ROTATOR CUFF REPAIR AND SUBACROMIAL DECOMPRESSION Right 04/17/2018   Procedure: Right shoulder evaluation under anesthesia, scope, debridement, subacromial decompression, rotator cuff repair;  Surgeon: Sydnee Cabal, MD;  Location: [facility];  Service: Orthopedics;   Laterality: Right;  120 mins General with Intra Scalene Block  . VAGINAL HYSTERECTOMY  02-12-2002   dr Nori Riis  [facility]  . WISDOM TOOTH EXTRACTION  teen    There were no vitals filed for this visit.  Subjective Assessment - 10/01/19 1446    Subjective  Reporting no new problems. Has been trying to massage her shoulder herself.    Pertinent History  migraine, HTN, concussion, GERD, anxiety    Diagnostic tests  none recent    Patient Stated Goals  get back to 100%    Currently in Pain?  No/denies                       [facility] Adult PT Treatment/Exercise - 10/01/19 0001      Shoulder Exercises: Supine   Other Supine Exercises  --    Other Supine Exercises  R shoulder IR/ER with shoulder in ~45 deg abduction with 2# x10, with 3# x5   popping with shoulder abducted to 90 deg, better at 45 deg     Shoulder Exercises: Seated   External Rotation  Strengthening;Both;10 reps;Theraband    Theraband Level (Shoulder External Rotation)  Level 3 (Green)    External Rotation Limitations  B ER with 2 beat pulse   cues to decrease ROM on L     Shoulder Exercises: Standing   Flexion  Both;Strengthening;Weights;10  reps    Flexion Limitations  2x10; Y lift offs from wall    Shoulder Elevation  Strengthening;Both;Standing    Shoulder Elevation Limitations  serratus roll up with foam roll and yellow TB   cues to maintain forearms parallel   Other Standing Exercises  R shoulder serratus punch against wall x10    Other Standing Exercises  R shoulder uppercut with green TB x10   report of posterior shoulder "stretch"     Shoulder Exercises: ROM/Strengthening   UBE (Upper Arm Bike)  L2.7 x 3 min forward/3 min back    Lat Pull  10 reps    Lat Pull Limitations  wide grip 20#    Cybex Row  10 reps    Cybex Row Limitations  5x 20#, 5x 25#      Shoulder Exercises: Stretch   Internal Rotation Stretch  2 reps    Internal Rotation Stretch Limitations  R sleeper stretch 2x20" to tolerance              PT Education - 10/01/19 1643    Education Details  update to HEP    Person(s) Educated  Patient    Methods  Explanation;Demonstration;Tactile cues;Verbal cues;Handout    Comprehension  Verbalized understanding;Returned demonstration       PT Short Term Goals - 09/10/19 1503      PT SHORT TERM GOAL #1   Title  Patient to be independent with initial HEP.    Time  3    Period  Weeks    Status  Achieved    Target Date  07/04/19        PT Long Term Goals - 09/10/19 1503      PT LONG TERM GOAL #1   Title  patient to be independent with advanced HEP    Time  6    Period  Weeks    Status  Partially Met   met for current   Target Date  10/22/19      PT LONG TERM GOAL #2   Title  Patient to demonstrate R shoulder AROM/PROM [facility] and without pain limiting.    Time  6    Period  Weeks    Status  Partially Met   09/05/19: improving R shoulder functional internal and external rotation AROM   Target Date  10/22/19      PT LONG TERM GOAL #3   Title  patient to improve R shoulder strength to >/= 4+/5 without pain    Time  6    Period  Weeks    Status  Partially Met   09/05/19: met for IR   Target Date  10/22/19      PT LONG TERM GOAL #4   Title  Patient to demonstrate overhead reaching to place 5# object on shelf overhead with <=1/10 pain.    Time  6    Period  Weeks    Status  Partially Met   09/05/19: 5# to 2nd shelf above head in cabinet however with R UT substitutions and pain rising to 2/10;  With visible R shoulder deltoid/RTC weakness remaining.   Target Date  10/22/19      PT LONG TERM GOAL #5   Title  Patient to report tolerance of dressing and grooming activities without pain limiting.    Time  6    Period  Weeks    Status  Partially Met   09/05/19: Still altering her dressing pattern "right arm in first" in order to  avoid shoulder pain   Target Date  10/22/19            Plan - 10/01/19 1640    Clinical Impression Statement  Patient  without new complaints today. Reporting that she is still experiencing "popping" in the R anterior shoulder with flexion-type movements. But does report benefit from biceps stretch. Worked on periscapular and RTC strengthening with minor cues for form. Patient reporting that she is still experiencing discomfort with putting her hand in her pocket or hand on her hip. Thus, introduced gentle sleeper stretch with good tolerance. Updated HEP with this exercise as this was well-tolerated. Ended session with machine strengthening- patient with good tolerance to the increase in weighted resistance. Patient without complaints at end of session.    Comorbidities  migraine, HTN, concussion, GERD, anxiety    Rehab Potential  Good    PT Treatment/Interventions  ADLs/Self Care Home Management;Cryotherapy;Electrical Stimulation;Iontophoresis 63m/ml Dexamethasone;Moist Heat;Therapeutic exercise;Therapeutic activities;Functional mobility training;Ultrasound;Neuromuscular re-education;Patient/family education;Manual techniques;Vasopneumatic Device;Taping;Energy conservation;Dry needling;Passive range of motion;Scar mobilization    PT Next Visit Plan  progress per protocol- completed week 12 on 09/19/19; continue gentle behind the back stretching and shoulder strength    Consulted and Agree with Plan of Care  Patient       Patient will benefit from skilled therapeutic intervention in order to improve the following deficits and impairments:  Hypomobility, Increased edema, Decreased scar mobility, Decreased activity tolerance, Decreased strength, Impaired UE functional use, Pain, Improper body mechanics, Decreased range of motion, Impaired flexibility, Postural dysfunction  Visit Diagnosis: Acute pain of right shoulder  Stiffness of right shoulder, not elsewhere classified  Muscle weakness (generalized)  Other symptoms and signs involving the musculoskeletal system     Problem List Patient Active Problem List    Diagnosis Date Noted  . S/P arthroscopy of right shoulder 04/17/2018     YJanene Harvey PT, DPT 10/01/19 4:45 PM   [facility] [address] [place] [address]Phone: [phone]  Fax:  [phone] Name: GNINFA GIANNELLIMRN: [id number] of Birth: [birth year]

## 2019-10-03 ENCOUNTER — Ambulatory Visit: Payer: PRIVATE HEALTH INSURANCE

## 2019-10-03 ENCOUNTER — Other Ambulatory Visit: Payer: Self-pay

## 2019-10-03 DIAGNOSIS — M25511 Pain in right shoulder: Secondary | ICD-10-CM | POA: Diagnosis not present

## 2019-10-03 DIAGNOSIS — M25611 Stiffness of right shoulder, not elsewhere classified: Secondary | ICD-10-CM

## 2019-10-03 DIAGNOSIS — R29898 Other symptoms and signs involving the musculoskeletal system: Secondary | ICD-10-CM

## 2019-10-03 DIAGNOSIS — M6281 Muscle weakness (generalized): Secondary | ICD-10-CM

## 2019-10-03 NOTE — Therapy (Signed)
[facility] [address]  [place], [place], [address] Phone: [phone]   Fax:  [phone]  Physical Therapy Treatment  Patient Details  Name: Monica Petty MRN: [id number] Date of Birth: 03-09-1966 Referring Provider (PT): Sydnee Cabal, MD   Encounter Date: 10/03/2019  PT End of Session - 10/03/19 1513    Visit Number  29    Number of Visits  37    Date for PT Re-Evaluation  10/22/19    Authorization Type  WC- 12 additional visits    Authorization - Visit Number  4    Authorization - Number of Visits  12    PT Start Time  9417    PT Stop Time  4081   ended visit with 10 min moist   PT Time Calculation (min)  50 min    Activity Tolerance  Patient tolerated treatment well    Behavior During Therapy  [facility] for tasks assessed/performed       Past Medical History:  Diagnosis Date  . Anxiety   . Diverticulosis of colon   . GERD (gastroesophageal reflux disease)   . History of colon polyps   . History of concussion    1987  fell of horse--- no residual  . Hypertension   . Migraine    occasional  . Rotator cuff tear    recurrent right side    Past Surgical History:  Procedure Laterality Date  . COLONOSCOPY  06/06/2016  . LAPAROSCOPIC CHOLECYSTECTOMY  06-03-2008   dr Ulyess Blossom  [facility]  . REDUCTION MAMMAPLASTY Bilateral 03-05-2002   dr barber  [facility]  . SHOULDER ARTHROSCOPY WITH ROTATOR CUFF REPAIR Right 05/23/2019   Procedure: SHOULDER ARTHROSCOPY WITH revision rotator cuff repair and  debirdement;  Surgeon: Sydnee Cabal, MD;  Location: [facility];  Service: Orthopedics;  Laterality: Right;  interscalene block  . SHOULDER ARTHROSCOPY WITH ROTATOR CUFF REPAIR AND SUBACROMIAL DECOMPRESSION Right 04/17/2018   Procedure: Right shoulder evaluation under anesthesia, scope, debridement, subacromial decompression, rotator cuff repair;  Surgeon: Sydnee Cabal, MD;  Location: [facility];  Service: Orthopedics;  Laterality: Right;  120 mins General with Intra Scalene Block  . VAGINAL HYSTERECTOMY  02-12-2002   dr Nori Riis  [facility]  . WISDOM TOOTH EXTRACTION  teen    There were no vitals filed for this visit.  Subjective Assessment - 10/03/19 1503    Subjective  Pt. noting a few hours of increased soreness after last session.    Pertinent History  migraine, HTN, concussion, GERD, anxiety    Diagnostic tests  none recent    Patient Stated Goals  get back to 100%    Currently in Pain?  No/denies    Pain Score  0-No pain    Pain Location  Shoulder    Pain Orientation  Right    Multiple Pain Sites  No                       [facility] Adult PT Treatment/Exercise - 10/03/19 0001      Shoulder Exercises: Standing   Flexion  Both;15 reps;Strengthening;Weights    Shoulder Flexion Weight (lbs)  2    Flexion Limitations  scaption leaning on wall    0-120 dg B without excessive scapular hiking    ABduction  Both;10 reps;Strengthening;Weights    Shoulder ABduction Weight (lbs)  2    ABduction Limitations  scaption/abd    Extension  Both;15 reps;Strengthening;Theraband  Theraband Level (Shoulder Extension)  Level 3 (Green)    Extension Limitations  Cues to avoid excessive scap. elevation     Row  Both;20 reps;Strengthening;Theraband    Theraband Level (Shoulder Row)  Level 3 (Green)    Row Limitations  Cues for increased band tension       Shoulder Exercises: ROM/Strengthening   UBE (Upper Arm Bike)  L3.0 x 3 min forward/3 min back    Wall Pushups  20 reps    Wall Pushups Limitations  Serratus press on wall       Shoulder Exercises: IT sales professional  30 seconds;3 reps    Corner Stretch Limitations  low and mid doorway stretch     Internal Rotation Stretch  2 reps    Internal Rotation Stretch Limitations  R sleeper stretch 2x20" to tolerance   cues for reduced pressure from opposite UE into stretch    Other Shoulder Stretches  R biceps stretch at  wall 2x30"    Other Shoulder Stretches  Standing R shouler extension stretch at TM rail 5" x 10 reps       Modalities   Modalities  Moist Heat      Moist Heat Therapy   Number Minutes Moist Heat  10 Minutes    Moist Heat Location  Shoulder   R shoulder heat per pt. request      Manual Therapy   Manual Therapy  Soft tissue mobilization;Myofascial release    Manual therapy comments  supine     Soft tissue mobilization  STM/DTM to R proximal biceps, R distal pecs, R anterior, lateral deltoid                PT Short Term Goals - 09/10/19 1503      PT SHORT TERM GOAL #1   Title  Patient to be independent with initial HEP.    Time  3    Period  Weeks    Status  Achieved    Target Date  07/04/19        PT Long Term Goals - 09/10/19 1503      PT LONG TERM GOAL #1   Title  patient to be independent with advanced HEP    Time  6    Period  Weeks    Status  Partially Met   met for current   Target Date  10/22/19      PT LONG TERM GOAL #2   Title  Patient to demonstrate R shoulder AROM/PROM [facility] and without pain limiting.    Time  6    Period  Weeks    Status  Partially Met   09/05/19: improving R shoulder functional internal and external rotation AROM   Target Date  10/22/19      PT LONG TERM GOAL #3   Title  patient to improve R shoulder strength to >/= 4+/5 without pain    Time  6    Period  Weeks    Status  Partially Met   09/05/19: met for IR   Target Date  10/22/19      PT LONG TERM GOAL #4   Title  Patient to demonstrate overhead reaching to place 5# object on shelf overhead with <=1/10 pain.    Time  6    Period  Weeks    Status  Partially Met   09/05/19: 5# to 2nd shelf above head in cabinet however with R UT substitutions and pain rising to 2/10;  With visible R shoulder deltoid/RTC weakness remaining.   Target Date  10/22/19      PT LONG TERM GOAL #5   Title  Patient to report tolerance of dressing and grooming activities without pain limiting.     Time  6    Period  Weeks    Status  Partially Met   09/05/19: Still altering her dressing pattern "right arm in first" in order to avoid shoulder pain   Target Date  10/22/19            Plan - 10/03/19 1513    Clinical Impression Statement  Pt. reporting some increased soreness after last visit for a few hours which was relieved with moist heat and massage at home.  Progressed ROM with elevation strengthening at 2# scaption, abduction today and reviewed proper pressure into sidelying sleeper stretch for improved IR ROM.  Pt. cued to reduce hand pressure with sleeper stretch as she feels this activity may have contributed to increased soreness after last session.  Ended visit with some anterior shoulder "discomfort" thus applied 10 min moist heat to R shoulder to reduce post-exercise tightness.  Visible improvement in R shoulder AROM IR today behind back with pt. now 3" above her bra strap and reports functional use of R shoulder for dressing now.  progressing toward LTG #2.    Comorbidities  migraine, HTN, concussion, GERD, anxiety    Rehab Potential  Good    PT Treatment/Interventions  ADLs/Self Care Home Management;Cryotherapy;Electrical Stimulation;Iontophoresis 46m/ml Dexamethasone;Moist Heat;Therapeutic exercise;Therapeutic activities;Functional mobility training;Ultrasound;Neuromuscular re-education;Patient/family education;Manual techniques;Vasopneumatic Device;Taping;Energy conservation;Dry needling;Passive range of motion;Scar mobilization    PT Next Visit Plan  progress per protocol- completed week 14 on 10/03/19; continue gentle behind the back stretching and shoulder strength    Consulted and Agree with Plan of Care  Patient       Patient will benefit from skilled therapeutic intervention in order to improve the following deficits and impairments:  Hypomobility, Increased edema, Decreased scar mobility, Decreased activity tolerance, Decreased strength, Impaired UE functional use,  Pain, Improper body mechanics, Decreased range of motion, Impaired flexibility, Postural dysfunction  Visit Diagnosis: Acute pain of right shoulder  Stiffness of right shoulder, not elsewhere classified  Muscle weakness (generalized)  Other symptoms and signs involving the musculoskeletal system     Problem List Patient Active Problem List   Diagnosis Date Noted  . S/P arthroscopy of right shoulder 04/17/2018    MBess Harvest PTA 10/03/19 4:42 PM   [facility] [address] [place] [address]Phone: [phone]  Fax:  [phone] Name: Monica KINNMRN: [id number] of Birth: [birth year]

## 2019-10-04 MED FILL — RESTASIS 0.05% EYE EMULSION: 0.05 | 15 days supply | Qty: 30 | Fill #0

## 2019-10-08 ENCOUNTER — Encounter: Payer: Self-pay | Admitting: Physical Therapy

## 2019-10-08 ENCOUNTER — Other Ambulatory Visit: Payer: Self-pay

## 2019-10-08 ENCOUNTER — Ambulatory Visit: Payer: PRIVATE HEALTH INSURANCE | Admitting: Physical Therapy

## 2019-10-08 DIAGNOSIS — M25511 Pain in right shoulder: Secondary | ICD-10-CM | POA: Diagnosis not present

## 2019-10-08 DIAGNOSIS — M25611 Stiffness of right shoulder, not elsewhere classified: Secondary | ICD-10-CM

## 2019-10-08 DIAGNOSIS — R29898 Other symptoms and signs involving the musculoskeletal system: Secondary | ICD-10-CM

## 2019-10-08 DIAGNOSIS — M6281 Muscle weakness (generalized): Secondary | ICD-10-CM

## 2019-10-08 NOTE — Therapy (Signed)
[facility] [address]  [place], [place], [address] Phone: [phone]   Fax:  [phone]  Physical Therapy Progress Note  Patient Details  Name: Monica Petty MRN: [id number] Date of Birth: 07-02-1966 Referring Provider (PT): Sydnee Cabal, MD    Encounter Date: 10/08/2019  PT End of Session - 10/08/19 1651    Visit Number  30    Number of Visits  37    Date for PT Re-Evaluation  10/22/19    Authorization Type  WC- 12 additional visits    Authorization - Visit Number  5    Authorization - Number of Visits  12    PT Start Time  6962    PT Stop Time  1529    PT Time Calculation (min)  42 min    Activity Tolerance  Patient tolerated treatment well    Behavior During Therapy  [facility] for tasks assessed/performed       Past Medical History:  Diagnosis Date  . Anxiety   . Diverticulosis of colon   . GERD (gastroesophageal reflux disease)   . History of colon polyps   . History of concussion    1987  fell of horse--- no residual  . Hypertension   . Migraine    occasional  . Rotator cuff tear    recurrent right side    Past Surgical History:  Procedure Laterality Date  . COLONOSCOPY  06/06/2016  . LAPAROSCOPIC CHOLECYSTECTOMY  06-03-2008   dr Ulyess Blossom  [facility]  . REDUCTION MAMMAPLASTY Bilateral 03-05-2002   dr barber  [facility]  . SHOULDER ARTHROSCOPY WITH ROTATOR CUFF REPAIR Right 05/23/2019   Procedure: SHOULDER ARTHROSCOPY WITH revision rotator cuff repair and  debirdement;  Surgeon: Sydnee Cabal, MD;  [facility];  Service: Orthopedics;  Laterality: Right;  interscalene block  . SHOULDER ARTHROSCOPY WITH ROTATOR CUFF REPAIR AND SUBACROMIAL DECOMPRESSION Right 04/17/2018   Procedure: Right shoulder evaluation under anesthesia, scope, debridement, subacromial decompression, rotator cuff repair;  Surgeon: Sydnee Cabal, MD;  Location: [facility];  Service: Orthopedics;   Laterality: Right;  120 mins General with Intra Scalene Block  . VAGINAL HYSTERECTOMY  02-12-2002   dr Nori Riis  [facility]  . WISDOM TOOTH EXTRACTION  teen    There were no vitals filed for this visit.  Subjective Assessment - 10/08/19 1448    Subjective  Finds herself hiking her shoulder up when she is working on the computer and is trying to be more aware of it. Reporting 50% improvement since surgery. Still having discomfort with putting her hand on her hip or in her pocket, reaching into her laundry basket, or reaching out to place her purse in the passenger's seat.    Pertinent History  migraine, HTN, concussion, GERD, anxiety    Diagnostic tests  none recent    Patient Stated Goals  get back to 100%    Currently in Pain?  No/denies         [facility] PT Assessment - 10/08/19 0001      Observation/Other Assessments   Focus on Therapeutic Outcomes (FOTO)   52% (48 limitation)      AROM   Right Shoulder Flexion  147 Degrees    Right Shoulder ABduction  118 Degrees   122 in scaption   Right Shoulder Internal Rotation  --   FIR T8   Right Shoulder External Rotation  --   FER C6     PROM  Right Shoulder Flexion  162 Degrees    Right Shoulder ABduction  150 Degrees    Right Shoulder Internal Rotation  91 Degrees    Right Shoulder External Rotation  72 Degrees      Strength   Right Shoulder Flexion  4+/5    Right Shoulder ABduction  4/5    Right Shoulder Internal Rotation  4/5    Right Shoulder External Rotation  4+/5   mild pain                  [facility] Adult PT Treatment/Exercise - 10/08/19 0001      Shoulder Exercises: Sidelying   ABduction  Strengthening;Right;5 reps    ABduction Weight (lbs)  2    ABduction Limitations  thumb up   uncomfortable pop in posterior shoulder     Shoulder Exercises: Standing   Internal Rotation  10 reps;Strengthening;Right;Theraband    Theraband Level (Shoulder Internal Rotation)  Level 2 (Red)   doubled up   Internal Rotation  Limitations  with shoulder in neutral    Other Standing Exercises  R shoulder flexion up into 2nd overhead shelf with 2, 3, 4 lbs       Shoulder Exercises: ROM/Strengthening   UBE (Upper Arm Bike)  L3.0 x 3 min forward/3 min back      Manual Therapy   Manual Therapy  Passive ROM    Manual therapy comments  supine     Passive ROM  R shoulder PROM in all directions to tolerance             PT Education - 10/08/19 1651    Education Details  update to HEP; discussion on objective progress and remaining impairments    Person(s) Educated  Patient    Methods  Explanation;Demonstration;Tactile cues;Verbal cues;Handout    Comprehension  Verbalized understanding;Returned demonstration       PT Short Term Goals - 10/08/19 1451      PT SHORT TERM GOAL #1   Title  Patient to be independent with initial HEP.    Time  3    Period  Weeks    Status  Achieved    Target Date  07/04/19        PT Long Term Goals - 10/08/19 1451      PT LONG TERM GOAL #1   Title  patient to be independent with advanced HEP    Time  6    Period  Weeks    Status  Partially Met   met for current     PT LONG TERM GOAL #2   Title  Patient to demonstrate R shoulder AROM/PROM WFL and without pain limiting.    Time  6    Period  Weeks    Status  Partially Met   improvement in R shoulder PROM in all planes, AROM improved in flexion and IR     PT LONG TERM GOAL #3   Title  patient to improve R shoulder strength to >/= 4+/5 without pain    Time  6    Period  Weeks    Status  Partially Met   improvement in flexion and abduction; most limitation remaining in IR and abduction strength     PT LONG TERM GOAL #4   Title  Patient to demonstrate overhead reaching to place 5# object on shelf overhead with <=1/10 pain.    Time  6    Period  Weeks    Status  Partially Met  able to place 4lbs to overhead shelf with report of discomfort and evident shoulder hiking     PT LONG TERM GOAL #5   Title  Patient to  report tolerance of dressing and grooming activities without pain limiting.    Time  6    Period  Weeks    Status  Partially Met   noting discomfort with washing and doing her hair but notes that donning bra is now longer painful           Plan - 10/08/19 1657    Clinical Impression Statement  Patient reporting 50% improvement in R shoulder since surgery. Notes that she still has discomfort with putting her hand on her hip or in her pocket, reaching into her laundry basket, or reaching out to place her purse in the passenger's seat. Continues to note discomfort with washing and doing her hair but notes that donning bra is now longer painful. Patient has demonstrated  improvement in R shoulder PROM in all planes, AROM improved in flexion and IR. Strength testing revealed improvement in flexion and abduction; most limitation remaining in IR and abduction strength. Patient today able to 4lbs onto overhead shelf with report of discomfort and evident shoulder hiking remaining. D/t patient's continued difficult with IR and abduction motions, worked on progressive ther-ex to address these planes of motion. Patient with report of intermittent cavitation in R posterior shoulder in with sidelying abduction, but able to continue without complaints. Patient reported understanding of new HEP update and without complaints at end of session. Patient is showing slow but steady progress towards goals. Plan to progress per POC.    Comorbidities  migraine, HTN, concussion, GERD, anxiety    Rehab Potential  Good    PT Treatment/Interventions  ADLs/Self Care Home Management;Cryotherapy;Electrical Stimulation;Iontophoresis 69m/ml Dexamethasone;Moist Heat;Therapeutic exercise;Therapeutic activities;Functional mobility training;Ultrasound;Neuromuscular re-education;Patient/family education;Manual techniques;Vasopneumatic Device;Taping;Energy conservation;Dry needling;Passive range of motion;Scar mobilization    PT Next  Visit Plan  progress per protocol- completed week 14 on 10/03/19; continue gentle behind the back stretching and shoulder strength    Consulted and Agree with Plan of Care  Patient       Patient will benefit from skilled therapeutic intervention in order to improve the following deficits and impairments:  Hypomobility, Increased edema, Decreased scar mobility, Decreased activity tolerance, Decreased strength, Impaired UE functional use, Pain, Improper body mechanics, Decreased range of motion, Impaired flexibility, Postural dysfunction  Visit Diagnosis: Acute pain of right shoulder  Stiffness of right shoulder, not elsewhere classified  Muscle weakness (generalized)  Other symptoms and signs involving the musculoskeletal system     Problem List Patient Active Problem List   Diagnosis Date Noted  . S/P arthroscopy of right shoulder 04/17/2018    JManuela Neptune1/19/2021, 6:00 PM  [facility] [place] [place] [address]Phone: [phone]  Fax:  [phone] Name: GJEANNINE PENNISIMRN: [id number] of Birth: [birth year]

## 2019-10-10 ENCOUNTER — Other Ambulatory Visit: Payer: Self-pay

## 2019-10-10 ENCOUNTER — Ambulatory Visit: Payer: PRIVATE HEALTH INSURANCE | Admitting: Physical Therapy

## 2019-10-10 ENCOUNTER — Encounter: Payer: Self-pay | Admitting: Physical Therapy

## 2019-10-10 DIAGNOSIS — M6281 Muscle weakness (generalized): Secondary | ICD-10-CM

## 2019-10-10 DIAGNOSIS — M25511 Pain in right shoulder: Secondary | ICD-10-CM

## 2019-10-10 DIAGNOSIS — M25611 Stiffness of right shoulder, not elsewhere classified: Secondary | ICD-10-CM

## 2019-10-10 DIAGNOSIS — R29898 Other symptoms and signs involving the musculoskeletal system: Secondary | ICD-10-CM

## 2019-10-10 NOTE — Therapy (Signed)
[facility] [place] [address]  [place], [place], [address] Phone: [phone]   Fax:  [phone]  Physical Therapy Treatment  Patient Details  Name: Monica Petty MRN: [id number] Date of Birth: 23-May-1966 Referring Provider (PT): Sydnee Cabal, MD   Encounter Date: 10/10/2019  PT End of Session - 10/10/19 1527    Visit Number  31    Number of Visits  43    Date for PT Re-Evaluation  10/22/19    Authorization Type  WC- 12 additional visits    Authorization - Visit Number  6    Authorization - Number of Visits  12    PT Start Time  7858    PT Stop Time  1536    PT Time Calculation (min)  50 min    Activity Tolerance  Patient tolerated treatment well;Patient limited by pain    Behavior During Therapy  [facility] for tasks assessed/performed       Past Medical History:  Diagnosis Date  . Anxiety   . Diverticulosis of colon   . GERD (gastroesophageal reflux disease)   . History of colon polyps   . History of concussion    1987  fell of horse--- no residual  . Hypertension   . Migraine    occasional  . Rotator cuff tear    recurrent right side    Past Surgical History:  Procedure Laterality Date  . COLONOSCOPY  06/06/2016  . LAPAROSCOPIC CHOLECYSTECTOMY  06-03-2008   dr Ulyess Blossom  [facility]  . REDUCTION MAMMAPLASTY Bilateral 03-05-2002   dr barber  [facility]  . SHOULDER ARTHROSCOPY WITH ROTATOR CUFF REPAIR Right 05/23/2019   Procedure: SHOULDER ARTHROSCOPY WITH revision rotator cuff repair and  debirdement;  Surgeon: Sydnee Cabal, MD;  Location: [facility];  Service: Orthopedics;  Laterality: Right;  interscalene block  . SHOULDER ARTHROSCOPY WITH ROTATOR CUFF REPAIR AND SUBACROMIAL DECOMPRESSION Right 04/17/2018   Procedure: Right shoulder evaluation under anesthesia, scope, debridement, subacromial decompression, rotator cuff repair;  Surgeon: Sydnee Cabal, MD;  Location: [facility];   Service: Orthopedics;  Laterality: Right;  120 mins General with Intra Scalene Block  . VAGINAL HYSTERECTOMY  02-12-2002   dr Nori Riis  [facility]  . WISDOM TOOTH EXTRACTION  teen    There were no vitals filed for this visit.  Subjective Assessment - 10/10/19 1447    Subjective  Reporting tenderness in her R posterior shoulder with banded IR. Has gone back to the green band for this instead of the doubled up red band. Notes that "it feels like a sore muscle that has been worked."    Pertinent History  migraine, HTN, concussion, GERD, anxiety    Diagnostic tests  none recent    Patient Stated Goals  get back to 100%    Currently in Pain?  Yes    Pain Score  3     Pain Location  Shoulder    Pain Orientation  Right;Posterior    Pain Descriptors / Indicators  Sore    Pain Type  Acute pain;Surgical pain                       [facility] Adult PT Treatment/Exercise - 10/10/19 0001      Shoulder Exercises: Standing   Extension  Both;15 reps;Strengthening;Theraband    Theraband Level (Shoulder Extension)  Level 2 (Red)    Extension Weight (lbs)  good tolerance    Row  Both;Strengthening;Theraband;10  reps    Theraband Level (Shoulder Row)  Level 3 (Green)    Row Limitations  2x10; good tolerance      Shoulder Exercises: ROM/Strengthening   UBE (Upper Arm Bike)  L3.2 x 3 min forward/3 min back      Modalities   Modalities  Electrical Stimulation;Cryotherapy      Cryotherapy   Number Minutes Cryotherapy  15 Minutes    Cryotherapy Location  Shoulder   R   Type of Cryotherapy  Ice pack      Electrical Stimulation   Electrical Stimulation Location  R posterior shoulder    Electrical Stimulation Action  IFC    Electrical Stimulation Parameters  80-150 hz; output to tolerance; 15 min    Electrical Stimulation Goals  Pain      Manual Therapy   Manual Therapy  Soft tissue mobilization;Myofascial release;Passive ROM;Joint mobilization    Manual therapy comments  sitting    Joint  Mobilization  gentle R shoulder inferior joint mobs and long axid distraction grade III to tolerance    Soft tissue mobilization  STM to R infraspinatus/teres, biceps muscle belly and proximal tendon, and posterior deltoid- tenderness over infra/teres    Myofascial Release  manual TPR to R biceps muscle belly    Passive ROM  gentle R shoulder scaption/abduction to tolerance   no c/o popping            PT Education - 10/10/19 1526    Education Details  advised patient to return to pain-free HEP such as AAROM, AROM, exercises with decreased banded or weighted resistance    Person(s) Educated  Patient    Methods  Explanation    Comprehension  Verbalized understanding       PT Short Term Goals - 10/08/19 1451      PT SHORT TERM GOAL #1   Title  Patient to be independent with initial HEP.    Time  3    Period  Weeks    Status  Achieved    Target Date  07/04/19        PT Long Term Goals - 10/08/19 1451      PT LONG TERM GOAL #1   Title  patient to be independent with advanced HEP    Time  6    Period  Weeks    Status  Partially Met   met for current     PT LONG TERM GOAL #2   Title  Patient to demonstrate R shoulder AROM/PROM WFL and without pain limiting.    Time  6    Period  Weeks    Status  Partially Met   improvement in R shoulder PROM in all planes, AROM improved in flexion and IR     PT LONG TERM GOAL #3   Title  patient to improve R shoulder strength to >/= 4+/5 without pain    Time  6    Period  Weeks    Status  Partially Met   improvement in flexion and abduction; most limitation remaining in IR and abduction strength     PT LONG TERM GOAL #4   Title  Patient to demonstrate overhead reaching to place 5# object on shelf overhead with <=1/10 pain.    Time  6    Period  Weeks    Status  Partially Met   able to place 4lbs to overhead shelf with report of discomfort and evident shoulder hiking     PT LONG TERM GOAL #  5   Title  Patient to report  tolerance of dressing and grooming activities without pain limiting.    Time  6    Period  Weeks    Status  Partially Met   noting discomfort with washing and doing her hair but notes that donning bra is now longer painful           Plan - 10/10/19 1528    Clinical Impression Statement  Patient reporting tenderness in posterior R shoulder after performing banded IR at home, thus noting that she has dropped down in banded resistance with this exercise.  Still having some remaining soreness today. Began session with STM and TPR to R infraspinatus/teres group, posterior deltoid, and biceps muscle belly. Patient with most tenderness in infraspinatus/teres. Followed with gentle shoulder abduction to patient's tolerance without c/o popping. Patient able to perform resisted periscapular strengthening without complaints. Counseled patient on continuing to self-adjust her HEP to her tolerance in order to avoid flare up and exacerbation of pain. Patient reported understanding. Ended session with e-stim and ice pack to R posterior shoulder for pain relief. Patient without complaints at end of session.    Comorbidities  migraine, HTN, concussion, GERD, anxiety    Rehab Potential  Good    PT Treatment/Interventions  ADLs/Self Care Home Management;Cryotherapy;Electrical Stimulation;Iontophoresis 3m/ml Dexamethasone;Moist Heat;Therapeutic exercise;Therapeutic activities;Functional mobility training;Ultrasound;Neuromuscular re-education;Patient/family education;Manual techniques;Vasopneumatic Device;Taping;Energy conservation;Dry needling;Passive range of motion;Scar mobilization    PT Next Visit Plan  progress per protocol- completed week 14 on 10/03/19; continue gentle behind the back stretching and shoulder strength    Consulted and Agree with Plan of Care  Patient       Patient will benefit from skilled therapeutic intervention in order to improve the following deficits and impairments:  Hypomobility,  Increased edema, Decreased scar mobility, Decreased activity tolerance, Decreased strength, Impaired UE functional use, Pain, Improper body mechanics, Decreased range of motion, Impaired flexibility, Postural dysfunction  Visit Diagnosis: Acute pain of right shoulder  Stiffness of right shoulder, not elsewhere classified  Muscle weakness (generalized)  Other symptoms and signs involving the musculoskeletal system     Problem List Patient Active Problem List   Diagnosis Date Noted  . S/P arthroscopy of right shoulder 04/17/2018     YJanene Harvey PT, DPT 10/10/19 3:44 PM   [facility] [address] [place] [address]Phone: [phone]  Fax:  [phone] Name: GJAHNIYAH REVEREMRN: [id number] of Birth: [birth year]

## 2019-10-15 ENCOUNTER — Other Ambulatory Visit: Payer: Self-pay

## 2019-10-15 ENCOUNTER — Encounter: Payer: Self-pay | Admitting: Physical Therapy

## 2019-10-15 ENCOUNTER — Ambulatory Visit: Payer: PRIVATE HEALTH INSURANCE | Admitting: Physical Therapy

## 2019-10-15 DIAGNOSIS — M25511 Pain in right shoulder: Secondary | ICD-10-CM | POA: Diagnosis not present

## 2019-10-15 DIAGNOSIS — M25611 Stiffness of right shoulder, not elsewhere classified: Secondary | ICD-10-CM

## 2019-10-15 DIAGNOSIS — R29898 Other symptoms and signs involving the musculoskeletal system: Secondary | ICD-10-CM

## 2019-10-15 DIAGNOSIS — M6281 Muscle weakness (generalized): Secondary | ICD-10-CM

## 2019-10-15 NOTE — Patient Instructions (Signed)
   Kinesiology tape  What is kinesiology tape?  There are many brands of kinesiology tape. KTape, Rock Tape, Body Sport, Dynamic tape, to name a few.  It is an elasticized tape designed to support the body's natural healing process. This tape provides stability and support to muscles and joints without restricting motion.  It can also help decrease swelling in the area of application.  How does it work?  The tape microscopically lifts and decompresses the skin to allow for drainage of lymph (swelling) to flow away from area, reducing inflammation. The tape has the ability to help re-educate the neuromuscular system by targeting specific receptors in the skin. The presence of the tape increases the body's awareness of posture and body mechanics.  Do not use with:  . Open wounds . Skin lesions . Adhesive allergies  In some rare cases, mild/moderate skin irritation can occur. This can include redness, itchiness, or hives. If this occurs, immediately remove tape and consult your primary care physician if symptoms are severe or do not resolve within 2 days.  Safe removal of the tape:  To remove tape safely, hold nearby skin with one hand and gentle roll tape down with other hand. You can apply oil or conditioner to tape while in shower prior to removal to loosen adhesive. DO NOT swiftly rip tape off like a band-aid, as this could cause skin tears and additional skin irritation.     For questions, please contact your therapist at:  [facility] [place] [address]  [address] [place], , [address] Phone: [phone]   Fax:  [phone]     

## 2019-10-15 NOTE — Therapy (Signed)
[facility] [place] [address]  [place], [place], [address] Phone: [phone]   Fax:  [phone]  Physical Therapy Treatment  Patient Details  Name: Monica Petty MRN: [id number] Date of Birth: [birth year] Referring Provider (PT): Sydnee Cabal, MD   Encounter Date: 10/15/2019  PT End of Session - 10/15/19 1634    Visit Number  32    Number of Visits  37    Date for PT Re-Evaluation  10/22/19    Authorization Type  WC- 12 additional visits    Authorization - Visit Number  7    Authorization - Number of Visits  12    PT Start Time  9373    PT Stop Time  1542    PT Time Calculation (min)  56 min    Activity Tolerance  Patient tolerated treatment well;Patient limited by pain    Behavior During Therapy  [facility] for tasks assessed/performed       Past Medical History:  Diagnosis Date  . Anxiety   . Diverticulosis of colon   . GERD (gastroesophageal reflux disease)   . History of colon polyps   . History of concussion    1987  fell of horse--- no residual  . Hypertension   . Migraine    occasional  . Rotator cuff tear    recurrent right side    Past Surgical History:  Procedure Laterality Date  . COLONOSCOPY  06/06/2016  . LAPAROSCOPIC CHOLECYSTECTOMY  06-03-2008   dr Ulyess Blossom  [facility]  . REDUCTION MAMMAPLASTY Bilateral 03-05-2002   dr barber  [facility]  . SHOULDER ARTHROSCOPY WITH ROTATOR CUFF REPAIR Right 05/23/2019   Procedure: SHOULDER ARTHROSCOPY WITH revision rotator cuff repair and  debirdement;  Surgeon: Sydnee Cabal, MD;  Location: [facility];  Service: Orthopedics;  Laterality: Right;  interscalene block  . SHOULDER ARTHROSCOPY WITH ROTATOR CUFF REPAIR AND SUBACROMIAL DECOMPRESSION Right 04/17/2018   Procedure: Right shoulder evaluation under anesthesia, scope, debridement, subacromial decompression, rotator cuff repair;  Surgeon: Sydnee Cabal, MD;  Location: [facility];   Service: Orthopedics;  Laterality: Right;  120 mins General with Intra Scalene Block  . VAGINAL HYSTERECTOMY  02-12-2002   dr Nori Riis  [facility]  . WISDOM TOOTH EXTRACTION  teen    There were no vitals filed for this visit.  Subjective Assessment - 10/15/19 1447    Subjective  Has used ice, TENS, and taken ibuprofen for her shoulder. Has continued with HEP using red TB and 1#. Feels like her pain is improved since last session, but still not 100%. Had soup for lunch, and bringing her spoon up to her mouth continually was uncomfortable for her. Reporting that she has been worried about the popping in her shoulder in the past, and feels like something is not right.    Pertinent History  migraine, HTN, concussion, GERD, anxiety    Diagnostic tests  none recent    Patient Stated Goals  get back to 100%    Currently in Pain?  No/denies                       [facility] Adult PT Treatment/Exercise - 10/15/19 0001      Shoulder Exercises: Seated   Flexion  Right;10 reps    Flexion Limitations  AROM to tolerance   c/o uncomfortable "crunch" on return down   Other Seated Exercises  R shoulder scaption x10 to tolerance  Shoulder Exercises: Sidelying   ABduction  AROM;Right;10 reps    ABduction Limitations  thumb up   nonpainful popping     Shoulder Exercises: ROM/Strengthening   UBE (Upper Arm Bike)  L3.0 x 3 min forward/3 min back      Vasopneumatic   Number Minutes Vasopneumatic   10 minutes    Vasopnuematic Location   Shoulder   R   Vasopneumatic Pressure  Low    Vasopneumatic Temperature   Coldest      Manual Therapy   Manual Therapy  Soft tissue mobilization;Myofascial release;Passive ROM;Joint mobilization;Taping    Manual therapy comments  L sidelying    Soft tissue mobilization  STM to R infraspinatus/teres-  tenderness and slight soft tissue restriction evident    Myofascial Release  manual TPR to R infraspinatus/teres group- TTP and small trigger pt    Passive ROM   gentle R shoulder PROM in all directions to tolerance   mild R posterior shoulder discomfort at end range abduction   Kinesiotex  Create Space      Kinesiotix   Create Space  R shoulder 50% stretch on split strip along infraspinatus, 50% stretch on split strip around deltoid             PT Education - 10/15/19 1632    Education Details  edu on KT tape wear time, precautions, removal; advised to return to pain-free HEP and not to hesitate to remove resistance if pain occurs    Person(s) Educated  Patient    Methods  Explanation;Demonstration;Tactile cues;Verbal cues;Handout    Comprehension  Verbalized understanding;Returned demonstration       PT Short Term Goals - 10/08/19 1451      PT SHORT TERM GOAL #1   Title  Patient to be independent with initial HEP.    Time  3    Period  Weeks    Status  Achieved    Target Date  07/04/19        PT Long Term Goals - 10/08/19 1451      PT LONG TERM GOAL #1   Title  patient to be independent with advanced HEP    Time  6    Period  Weeks    Status  Partially Met   met for current     PT LONG TERM GOAL #2   Title  Patient to demonstrate R shoulder AROM/PROM WFL and without pain limiting.    Time  6    Period  Weeks    Status  Partially Met   improvement in R shoulder PROM in all planes, AROM improved in flexion and IR     PT LONG TERM GOAL #3   Title  patient to improve R shoulder strength to >/= 4+/5 without pain    Time  6    Period  Weeks    Status  Partially Met   improvement in flexion and abduction; most limitation remaining in IR and abduction strength     PT LONG TERM GOAL #4   Title  Patient to demonstrate overhead reaching to place 5# object on shelf overhead with <=1/10 pain.    Time  6    Period  Weeks    Status  Partially Met   able to place 4lbs to overhead shelf with report of discomfort and evident shoulder hiking     PT LONG TERM GOAL #5   Title  Patient to report tolerance of dressing and grooming  activities without pain limiting.  Time  6    Period  Weeks    Status  Partially Met   noting discomfort with washing and doing her hair but notes that donning bra is now longer painful           Plan - 10/15/19 1635    Clinical Impression Statement  Patient reporting improvement in R posterior shoulder pain since last session. However, still does not feel that it is quite back to baseline. Noticed shoulder pain while eating soup today, d/t the repetitive motion of bringing a spoon up to her mouth. Patient continues to express worry about the "popping" or "sensation of movement" in her R shoulder which she was previously advised by MD is scar tissue. D/t patient's continued pain focused on pain management techniques and gentle ROM for hopeful relief. Patient tolerated gentle R shoulder AROM, with slight discomfort in posterior shoulder with end-range abduction. Patient received STM and TPR to R infraspinatus/teres group for improvement in pain and soft tissue restriction. Patient able to perform sitting and sidelying R shoulder AROM and instructed to perform to tolerable range only. Patient still with report of cavitation in R shoulder in both of these positions, with scaption being most comfortable. Patient received KT tape to R shoulder for pain relief. Patient reported understanding of wear time, removal, and precautions. Ended session with [place] to R shoulder. No complaints at end of session. Plan to monitor shoulder pain and continue ther-ex to patient's tolerance next session.    Comorbidities  migraine, HTN, concussion, GERD, anxiety    Rehab Potential  Good    PT Treatment/Interventions  ADLs/Self Care Home Management;Cryotherapy;Electrical Stimulation;Iontophoresis 32m/ml Dexamethasone;Moist Heat;Therapeutic exercise;Therapeutic activities;Functional mobility training;Ultrasound;Neuromuscular re-education;Patient/family education;Manual techniques;Vasopneumatic Device;Taping;Energy  conservation;Dry needling;Passive range of motion;Scar mobilization    PT Next Visit Plan  monitor for shoulder pain & progress per tolerance; completed week 15 on 10/10/19; continue gentle behind the back stretching and shoulder strength    Consulted and Agree with Plan of Care  Patient       Patient will benefit from skilled therapeutic intervention in order to improve the following deficits and impairments:  Hypomobility, Increased edema, Decreased scar mobility, Decreased activity tolerance, Decreased strength, Impaired UE functional use, Pain, Improper body mechanics, Decreased range of motion, Impaired flexibility, Postural dysfunction  Visit Diagnosis: Acute pain of right shoulder  Stiffness of right shoulder, not elsewhere classified  Muscle weakness (generalized)  Other symptoms and signs involving the musculoskeletal system     Problem List Patient Active Problem List   Diagnosis Date Noted  . S/P arthroscopy of right shoulder 04/17/2018    YJanene Harvey PT, DPT 10/15/19 4:39 PM   [facility] [address] [place] [address]Phone: [phone]  Fax:  [phone] Name: Monica MUNGINMRN: [id number] of Birth: 508-30-1967

## 2019-10-17 ENCOUNTER — Ambulatory Visit: Payer: PRIVATE HEALTH INSURANCE

## 2019-10-17 ENCOUNTER — Other Ambulatory Visit: Payer: Self-pay

## 2019-10-17 DIAGNOSIS — M25511 Pain in right shoulder: Secondary | ICD-10-CM

## 2019-10-17 DIAGNOSIS — M25611 Stiffness of right shoulder, not elsewhere classified: Secondary | ICD-10-CM

## 2019-10-17 DIAGNOSIS — R29898 Other symptoms and signs involving the musculoskeletal system: Secondary | ICD-10-CM

## 2019-10-17 DIAGNOSIS — M6281 Muscle weakness (generalized): Secondary | ICD-10-CM

## 2019-10-17 NOTE — Therapy (Signed)
[facility] [address]  [place], [place], [address] Phone: [phone]   Fax:  [phone]  Physical Therapy Treatment  Patient Details  Name: Monica Petty MRN: [id number] Date of Birth: March 26, 1966 Referring Provider (PT): Sydnee Cabal, MD   Encounter Date: 10/17/2019  PT End of Session - 10/17/19 1449    Visit Number  33    Number of Visits  37    Date for PT Re-Evaluation  10/22/19    Authorization Type  WC- 12 additional visits    Authorization - Visit Number  8    Authorization - Number of Visits  12    PT Start Time  0300    PT Stop Time  1532    PT Time Calculation (min)  47 min    Activity Tolerance  Patient tolerated treatment well;Patient limited by pain    Behavior During Therapy  [facility] for tasks assessed/performed       Past Medical History:  Diagnosis Date  . Anxiety   . Diverticulosis of colon   . GERD (gastroesophageal reflux disease)   . History of colon polyps   . History of concussion    1987  fell of horse--- no residual  . Hypertension   . Migraine    occasional  . Rotator cuff tear    recurrent right side    Past Surgical History:  Procedure Laterality Date  . COLONOSCOPY  06/06/2016  . LAPAROSCOPIC CHOLECYSTECTOMY  06-03-2008   dr Ulyess Blossom  [facility]  . REDUCTION MAMMAPLASTY Bilateral 03-05-2002   dr barber  [facility]  . SHOULDER ARTHROSCOPY WITH ROTATOR CUFF REPAIR Right 05/23/2019   Procedure: SHOULDER ARTHROSCOPY WITH revision rotator cuff repair and  debirdement;  Surgeon: Sydnee Cabal, MD;  Location: [facility];  Service: Orthopedics;  Laterality: Right;  interscalene block  . SHOULDER ARTHROSCOPY WITH ROTATOR CUFF REPAIR AND SUBACROMIAL DECOMPRESSION Right 04/17/2018   Procedure: Right shoulder evaluation under anesthesia, scope, debridement, subacromial decompression, rotator cuff repair;  Surgeon: Sydnee Cabal, MD;  Location: [facility];   Service: Orthopedics;  Laterality: Right;  120 mins General with Intra Scalene Block  . VAGINAL HYSTERECTOMY  02-12-2002   dr Nori Riis  [facility]  . WISDOM TOOTH EXTRACTION  teen    There were no vitals filed for this visit.  Subjective Assessment - 10/17/19 1448    Subjective  Notes some improved pain levels with taping to R shoulder.    Pertinent History  migraine, HTN, concussion, GERD, anxiety    Diagnostic tests  none recent    Patient Stated Goals  get back to 100%    Currently in Pain?  No/denies    Pain Score  0-No pain   up to 2/10 at worst at work   Pain Location  Shoulder    Pain Orientation  Right;Posterior    Pain Descriptors / Indicators  Burning;Sore    Pain Type  Acute pain;Surgical pain    Pain Onset  More than a month ago    Pain Frequency  Intermittent    Multiple Pain Sites  No         [facility] PT Assessment - 10/17/19 0001      AROM   AROM Assessment Site  Shoulder    Right/Left Shoulder  Right    Right Shoulder Flexion  140 Degrees   3/10 "burning" in anterior shoulder   Right Shoulder ABduction  139 Degrees   2/10 "burning" pain  in superior shoulder    Right Shoulder Internal Rotation  --   FIR to T8   Right Shoulder External Rotation  --   FER to C7     PROM   PROM Assessment Site  Shoulder    Right/Left Shoulder  Right    Right Shoulder Flexion  145 Degrees    Right Shoulder ABduction  140 Degrees    Right Shoulder Internal Rotation  93 Degrees    Right Shoulder External Rotation  70 Degrees      Strength   Strength Assessment Site  Shoulder    Right/Left Shoulder  Right    Right Shoulder Flexion  4+/5   2/10 pain in anterior shoulders   Right Shoulder ABduction  4+/5   2/10 pain in anterior shoulder    Right Shoulder Internal Rotation  4+/5    Right Shoulder External Rotation  4/5                   [facility] Adult PT Treatment/Exercise - 10/17/19 0001      Shoulder Exercises: ROM/Strengthening   UBE (Upper Arm Bike)  L3.0 x 3 min  forward/3 min back      Shoulder Exercises: Stretch   Other Shoulder Stretches  R posterior/inferior shoulder stretch 3 x 30 sec    Improved PROM flexion, abduction noted afterward     Manual Therapy   Manual Therapy  Soft tissue mobilization;Myofascial release;Passive ROM;Joint mobilization    Manual therapy comments  L sidelying and supine     Soft tissue mobilization  STM to R infraspinatus/teres-  tenderness     Myofascial Release  manual TPR to R infraspinatus/teres group - tenderness and TP noted     Passive ROM  gentle R shoulder PROM in all directions to tolerance with prolonged hold times for muscular relaxation                PT Short Term Goals - 10/08/19 1451      PT SHORT TERM GOAL #1   Title  Patient to be independent with initial HEP.    Time  3    Period  Weeks    Status  Achieved    Target Date  07/04/19        PT Long Term Goals - 10/17/19 1509      PT LONG TERM GOAL #1   Title  patient to be independent with advanced HEP    Time  6    Period  Weeks    Status  Partially Met   met for current     PT LONG TERM GOAL #2   Title  Patient to demonstrate R shoulder AROM/PROM WFL and without pain limiting.    Time  6    Period  Weeks    Status  Partially Met      PT LONG TERM GOAL #3   Title  patient to improve R shoulder strength to >/= 4+/5 without pain    Time  6    Period  Weeks    Status  Partially Met   10/17/19: met for flexion, abduction, IR - however pain on resistance in all directions     PT LONG TERM GOAL #4   Title  Patient to demonstrate overhead reaching to place 5# object on shelf overhead with <=1/10 pain.    Time  6    Period  Weeks    Status  Partially Met   10/17/19: able to place 4lbs  to overhead shelf with report of discomfort and evident shoulder hiking     PT LONG TERM GOAL #5   Title  Patient to report tolerance of dressing and grooming activities without pain limiting.    Time  6    Period  Weeks    Status   Partially Met   09/2819: noting discomfort with washing and doing her hair; having shoulder pain "pulling up pants".           Plan - 10/17/19 1829    Clinical Impression Statement Pt. has made good progress with physical therapy however somewhat plateaued as of late.  Does note recent "flare-up" of R shoulder pain is "50% resolved" with remaining tenderness in R posterior shoulder which has been addressed with MT over last few visits with some relief noted.  Does continue with palpable "popping" and "grinding" with scaption, and flexion motions and patient expressing some apprehension regarding this today.  Able to partially achieve strength goal with R shoulder demonstrating flexion, abduction, IR strength of 4+/5 with MMT and remaining at 4/5 strength with ER with tenderness in posterior/inferior shoulder on resistance.  Addressed tightness/tenderness in posterior/inferior shoulder with gentle STM today and posterior shoulder/lats stretching with visible improvement in overhead AROM following.  Still limited in R shoulder AROM flexion, abduction, and FER with complaint of "grinding"/"popping" with these motions.  LTG #2 partially achieved.  Pt. continues with report of discomfort and visible scapular "hiking" with 4# dumbbell raise to 2nd overhead shelf today.  LTG #4 partially achieved.  Now complaining of R shoulder pain with washing/fixing hair and "pulling up pants" with daily activities thus LTG #5 partially achieved.  Pt. to see MD this upcoming week for f/u.  Will continue to benefit from further skilled therapy for improved strength and ROM.     Comorbidities  migraine, HTN, concussion, GERD, anxiety    Rehab Potential  Good    PT Treatment/Interventions  ADLs/Self Care Home Management;Cryotherapy;Electrical Stimulation;Iontophoresis 11m/ml Dexamethasone;Moist Heat;Therapeutic exercise;Therapeutic activities;Functional mobility training;Ultrasound;Neuromuscular re-education;Patient/family  education;Manual techniques;Vasopneumatic Device;Taping;Energy conservation;Dry needling;Passive range of motion;Scar mobilization    PT Next Visit Plan  monitor for shoulder pain & progress per tolerance; completed week 16 on 10/17/19; continue gentle behind the back stretching and shoulder strength    Consulted and Agree with Plan of Care  Patient       Patient will benefit from skilled therapeutic intervention in order to improve the following deficits and impairments:  Hypomobility, Increased edema, Decreased scar mobility, Decreased activity tolerance, Decreased strength, Impaired UE functional use, Pain, Improper body mechanics, Decreased range of motion, Impaired flexibility, Postural dysfunction  Visit Diagnosis: Acute pain of right shoulder  Stiffness of right shoulder, not elsewhere classified  Muscle weakness (generalized)  Other symptoms and signs involving the musculoskeletal system     Problem List Patient Active Problem List   Diagnosis Date Noted  . S/P arthroscopy of right shoulder 04/17/2018    MBess Harvest PTA 10/17/19 6:30 PM   [facility] [address] [place] [address]Phone: [phone]  Fax:  [phone] Name: Monica VEGAMRN: [id number] of Birth: [birth year]

## 2019-10-22 ENCOUNTER — Ambulatory Visit: Payer: PRIVATE HEALTH INSURANCE | Attending: Specialist

## 2019-10-22 ENCOUNTER — Other Ambulatory Visit: Payer: Self-pay

## 2019-10-22 DIAGNOSIS — R29898 Other symptoms and signs involving the musculoskeletal system: Secondary | ICD-10-CM | POA: Diagnosis present

## 2019-10-22 DIAGNOSIS — M6281 Muscle weakness (generalized): Secondary | ICD-10-CM | POA: Insufficient documentation

## 2019-10-22 DIAGNOSIS — M25511 Pain in right shoulder: Secondary | ICD-10-CM | POA: Insufficient documentation

## 2019-10-22 DIAGNOSIS — M25611 Stiffness of right shoulder, not elsewhere classified: Secondary | ICD-10-CM | POA: Diagnosis present

## 2019-10-22 NOTE — Therapy (Signed)
[facility] [address]  [place], [place], [address] Phone: [phone]   Fax:  [phone]  Physical Therapy Treatment  Patient Details  Name: JERICHO CIESLIK MRN: [id number] Date of Birth: 1966-04-21 Referring Provider (PT): Sydnee Cabal, MD   Encounter Date: 10/22/2019  PT End of Session - 10/22/19 1517    Visit Number  34    Number of Visits  37    Date for PT Re-Evaluation  10/22/19    Authorization Type  WC- 12 additional visits    Authorization - Visit Number  9    Authorization - Number of Visits  12    PT Start Time  4128    PT Stop Time  7867    PT Time Calculation (min)  44 min    Activity Tolerance  Patient tolerated treatment well;Patient limited by pain    Behavior During Therapy  [facility] for tasks assessed/performed       Past Medical History:  Diagnosis Date  . Anxiety   . Diverticulosis of colon   . GERD (gastroesophageal reflux disease)   . History of colon polyps   . History of concussion    1987  fell of horse--- no residual  . Hypertension   . Migraine    occasional  . Rotator cuff tear    recurrent right side    Past Surgical History:  Procedure Laterality Date  . COLONOSCOPY  06/06/2016  . LAPAROSCOPIC CHOLECYSTECTOMY  06-03-2008   dr Ulyess Blossom  [facility]  . REDUCTION MAMMAPLASTY Bilateral 03-05-2002   dr barber  [facility]  . SHOULDER ARTHROSCOPY WITH ROTATOR CUFF REPAIR Right 05/23/2019   Procedure: SHOULDER ARTHROSCOPY WITH revision rotator cuff repair and  debirdement;  Surgeon: Sydnee Cabal, MD;  Location: [facility];  Service: Orthopedics;  Laterality: Right;  interscalene block  . SHOULDER ARTHROSCOPY WITH ROTATOR CUFF REPAIR AND SUBACROMIAL DECOMPRESSION Right 04/17/2018   Procedure: Right shoulder evaluation under anesthesia, scope, debridement, subacromial decompression, rotator cuff repair;  Surgeon: Sydnee Cabal, MD;  Location: [facility];   Service: Orthopedics;  Laterality: Right;  120 mins General with Intra Scalene Block  . VAGINAL HYSTERECTOMY  02-12-2002   dr Nori Riis  [facility]  . WISDOM TOOTH EXTRACTION  teen    There were no vitals filed for this visit.  Subjective Assessment - 10/22/19 1453    Subjective  Pt. reporting she saw MD for f/u.  After MD palpated R shoulder "popping" and moved patient through a few motions and told patient she may have a "loose anchor" in her R shoulder from previous surgery.    Pertinent History  migraine, HTN, concussion, GERD, anxiety    Diagnostic tests  none recent    Patient Stated Goals  get back to 100%    Currently in Pain?  Yes    Pain Score  0-No pain   pain rising to 4/10 soreness after MD manipulating her R shoulder   Pain Location  Shoulder                       [facility] Adult PT Treatment/Exercise - 10/22/19 0001      Shoulder Exercises: Prone   Extension  15 reps;Both;Strengthening    Extension Limitations  standing leaning over green p-ball     Horizontal ABduction 1  Both;15 reps    Horizontal ABduction 1 Limitations  Prone "T" leaning over green p-ball on table  Shoulder Exercises: Sidelying   Flexion  Right;AROM;15 reps    Other Sidelying Exercises  R shoulder horizontal abduction x 10 rpes       Shoulder Exercises: Standing   Flexion  Both;15 reps;Strengthening;Weights    Shoulder Flexion Weight (lbs)  1    Flexion Limitations  0-120 dg     ABduction  Both;15 reps;Strengthening;Weights    Shoulder ABduction Weight (lbs)  1    ABduction Limitations  0-110 dg ROM      Shoulder Exercises: ROM/Strengthening   UBE (Upper Arm Bike)  L3.0 x 3 min forward/3 min back    Cybex Row  15 reps    Cybex Row Limitations  20#, 15# - low     Wall Pushups  15 reps    Wall Pushups Limitations  cues for elbows close     Pushups Limitations  x 15 serratus punch on wall      Shoulder Exercises: Stretch   Other Shoulder Stretches  R posterior/inferior shoulder  stretch 3 x 30 sec     Other Shoulder Stretches  R lats stretch 3 x 20 sec each               PT Short Term Goals - 10/08/19 1451      PT SHORT TERM GOAL #1   Title  Patient to be independent with initial HEP.    Time  3    Period  Weeks    Status  Achieved    Target Date  07/04/19        PT Long Term Goals - 10/17/19 1509      PT LONG TERM GOAL #1   Title  patient to be independent with advanced HEP    Time  6    Period  Weeks    Status  Partially Met   met for current     PT LONG TERM GOAL #2   Title  Patient to demonstrate R shoulder AROM/PROM WFL and without pain limiting.    Time  6    Period  Weeks    Status  Partially Met      PT LONG TERM GOAL #3   Title  patient to improve R shoulder strength to >/= 4+/5 without pain    Time  6    Period  Weeks    Status  Partially Met   10/17/19: met for flexion, abduction, IR - however pain on resistance in all directions     PT LONG TERM GOAL #4   Title  Patient to demonstrate overhead reaching to place 5# object on shelf overhead with <=1/10 pain.    Time  6    Period  Weeks    Status  Partially Met   10/17/19: able to place 4lbs to overhead shelf with report of discomfort and evident shoulder hiking     PT LONG TERM GOAL #5   Title  Patient to report tolerance of dressing and grooming activities without pain limiting.    Time  6    Period  Weeks    Status  Partially Met   09/2819: noting discomfort with washing and doing her hair; having shoulder pain "pulling up pants".           Plan - 10/22/19 1519    Clinical Impression Statement  Pt. reporting she saw MD for f/u since last therapy visit.  Pt. reports, MD determining that pt. may have a "failed suture" in R shoulder contributing to "popping"/"grinding" sensation  she is having with elevation activities.  Pt. reports MD did not mention any changes in regards to physical therapy.  Progressed elevation strengthening today which was tolerated well  aside from continued "grinding" and pain at ~ 120 dg flexion AROM against gravity.  Pt. expressing apprehension regarding need for further surgery.  Notes she is having MRI scheduled for R shoulder.  Ended visit pain free thus modalities deferred.    Comorbidities  migraine, HTN, concussion, GERD, anxiety    Rehab Potential  Good    PT Treatment/Interventions  ADLs/Self Care Home Management;Cryotherapy;Electrical Stimulation;Iontophoresis 36m/ml Dexamethasone;Moist Heat;Therapeutic exercise;Therapeutic activities;Functional mobility training;Ultrasound;Neuromuscular re-education;Patient/family education;Manual techniques;Vasopneumatic Device;Taping;Energy conservation;Dry needling;Passive range of motion;Scar mobilization    PT Next Visit Plan  monitor for shoulder pain & progress per tolerance; completed week 16 on 10/17/19; continue gentle behind the back stretching and shoulder strength    Consulted and Agree with Plan of Care  Patient       Patient will benefit from skilled therapeutic intervention in order to improve the following deficits and impairments:  Hypomobility, Increased edema, Decreased scar mobility, Decreased activity tolerance, Decreased strength, Impaired UE functional use, Pain, Improper body mechanics, Decreased range of motion, Impaired flexibility, Postural dysfunction  Visit Diagnosis: Acute pain of right shoulder  Stiffness of right shoulder, not elsewhere classified  Muscle weakness (generalized)  Other symptoms and signs involving the musculoskeletal system     Problem List Patient Active Problem List   Diagnosis Date Noted  . S/P arthroscopy of right shoulder 04/17/2018    MBess Harvest PTA 10/22/19 3:55 PM   [facility] [place] [address]Phone: [phone]  Fax:  [phone] Name: GHIDAYA DANIELMRN: [id number] of Birth: [birth year]

## 2019-10-23 ENCOUNTER — Other Ambulatory Visit (HOSPITAL_COMMUNITY): Payer: Self-pay | Admitting: Specialist

## 2019-10-23 ENCOUNTER — Other Ambulatory Visit (HOSPITAL_COMMUNITY): Payer: Self-pay

## 2019-10-23 DIAGNOSIS — Z9889 Other specified postprocedural states: Secondary | ICD-10-CM

## 2019-10-24 ENCOUNTER — Encounter: Payer: Self-pay | Admitting: Physical Therapy

## 2019-10-24 ENCOUNTER — Ambulatory Visit: Payer: PRIVATE HEALTH INSURANCE | Admitting: Physical Therapy

## 2019-10-24 ENCOUNTER — Other Ambulatory Visit: Payer: Self-pay

## 2019-10-24 DIAGNOSIS — M6281 Muscle weakness (generalized): Secondary | ICD-10-CM

## 2019-10-24 DIAGNOSIS — M25611 Stiffness of right shoulder, not elsewhere classified: Secondary | ICD-10-CM

## 2019-10-24 DIAGNOSIS — M25511 Pain in right shoulder: Secondary | ICD-10-CM

## 2019-10-24 DIAGNOSIS — R29898 Other symptoms and signs involving the musculoskeletal system: Secondary | ICD-10-CM

## 2019-10-24 NOTE — Therapy (Signed)
[facility] [address]  [place], [place], [address] Phone: [phone]   Fax:  [phone]  Physical Therapy Treatment  Patient Details  Name: Monica Petty MRN: [id number] Date of Birth: 1965-10-11 Referring Provider (PT): Sydnee Cabal, MD   Encounter Date: 10/24/2019  PT End of Session - 10/24/19 1526    Visit Number  35    Number of Visits  37    Date for PT Re-Evaluation  10/22/19    Authorization Type  WC- 12 additional visits approved on 10/22/19    Authorization - Visit Number  10    Authorization - Number of Visits  24    PT Start Time  1443    PT Stop Time  1531    PT Time Calculation (min)  44 min    Activity Tolerance  Patient tolerated treatment well;Patient limited by pain    Behavior During Therapy  [facility] for tasks assessed/performed       Past Medical History:  Diagnosis Date  . Anxiety   . Diverticulosis of colon   . GERD (gastroesophageal reflux disease)   . History of colon polyps   . History of concussion    1987  fell of horse--- no residual  . Hypertension   . Migraine    occasional  . Rotator cuff tear    recurrent right side    Past Surgical History:  Procedure Laterality Date  . COLONOSCOPY  06/06/2016  . LAPAROSCOPIC CHOLECYSTECTOMY  06-03-2008   dr Ulyess Blossom  [facility]  . REDUCTION MAMMAPLASTY Bilateral 03-05-2002   dr barber  [facility]  . SHOULDER ARTHROSCOPY WITH ROTATOR CUFF REPAIR Right 05/23/2019   Procedure: SHOULDER ARTHROSCOPY WITH revision rotator cuff repair and  debirdement;  Surgeon: Sydnee Cabal, MD;  Location: [facility];  Service: Orthopedics;  Laterality: Right;  interscalene block  . SHOULDER ARTHROSCOPY WITH ROTATOR CUFF REPAIR AND SUBACROMIAL DECOMPRESSION Right 04/17/2018   Procedure: Right shoulder evaluation under anesthesia, scope, debridement, subacromial decompression, rotator cuff repair;  Surgeon: Sydnee Cabal, MD;  Location: [facility];  Service: Orthopedics;  Laterality: Right;  120 mins General with Intra Scalene Block  . VAGINAL HYSTERECTOMY  02-12-2002   dr Nori Riis  [facility]  . WISDOM TOOTH EXTRACTION  teen    There were no vitals filed for this visit.  Subjective Assessment - 10/24/19 1449    Subjective  Sometimes she is not sure if her exercises are irritating her shoulder, but thinks that she also may just be worried about what is going on with her shoulder.    Pertinent History  migraine, HTN, concussion, GERD, anxiety    Diagnostic tests  none recent    Patient Stated Goals  get back to 100%    Currently in Pain?  Yes    Pain Score  1     Pain Location  Shoulder    Pain Orientation  Right;Anterior    Pain Descriptors / Indicators  --   irritated   Pain Type  Acute pain;Surgical pain                       [facility] Adult PT Treatment/Exercise - 10/24/19 0001      Shoulder Exercises: Supine   Horizontal ABduction  Strengthening;Both;10 reps;Theraband    Theraband Level (Shoulder Horizontal ABduction)  Level 2 (Red)    Horizontal ABduction Limitations  2x10; good control & tolerance  Shoulder Exercises: Seated   External Rotation  Right;10 reps;AAROM    External Rotation Weight (lbs)  with wand to tolerance    Internal Rotation  AAROM;Right;10 reps    Internal Rotation Limitations  with wand to tolerance    Flexion  AAROM;Right;10 reps    Flexion Limitations  with wand    good tolerance, no popping   Abduction  Strengthening;Right;10 reps    ABduction Limitations  with wand    good tolerance, no popping   Other Seated Exercises  B ER with red TB x10   good control     Shoulder Exercises: ROM/Strengthening   UBE (Upper Arm Bike)  L3.0 x 3 min forward/3 min back      Vasopneumatic   Number Minutes Vasopneumatic   10 minutes    Vasopnuematic Location   Shoulder   R   Vasopneumatic Pressure  Low    Vasopneumatic Temperature   Coldest      Manual Therapy   Kinesiotex   Create Space      Kinesiotix   Create Space  R shoulder 50% stretch on split strip along supraspinatus, 50% stretch on split strip around deltoid   sensitive skin variety            PT Education - 10/24/19 1525    Education Details  advised patient to omit overhead strengthening activities that bring on "popping" or pain in the R shoulder, instead opting for pain-free HEP    Person(s) Educated  Patient    Methods  Explanation;Demonstration    Comprehension  Verbalized understanding       PT Short Term Goals - 10/08/19 1451      PT SHORT TERM GOAL #1   Title  Patient to be independent with initial HEP.    Time  3    Period  Weeks    Status  Achieved    Target Date  07/04/19        PT Long Term Goals - 10/17/19 1509      PT LONG TERM GOAL #1   Title  patient to be independent with advanced HEP    Time  6    Period  Weeks    Status  Partially Met   met for current     PT LONG TERM GOAL #2   Title  Patient to demonstrate R shoulder AROM/PROM WFL and without pain limiting.    Time  6    Period  Weeks    Status  Partially Met      PT LONG TERM GOAL #3   Title  patient to improve R shoulder strength to >/= 4+/5 without pain    Time  6    Period  Weeks    Status  Partially Met   10/17/19: met for flexion, abduction, IR - however pain on resistance in all directions     PT LONG TERM GOAL #4   Title  Patient to demonstrate overhead reaching to place 5# object on shelf overhead with <=1/10 pain.    Time  6    Period  Weeks    Status  Partially Met   10/17/19: able to place 4lbs to overhead shelf with report of discomfort and evident shoulder hiking     PT LONG TERM GOAL #5   Title  Patient to report tolerance of dressing and grooming activities without pain limiting.    Time  6    Period  Weeks    Status  Partially  Met   09/2819: noting discomfort with washing and doing her hair; having shoulder pain "pulling up pants".           Plan - 10/24/19 1527     Clinical Impression Statement  Patient reporting continued "irritation" in R anterior shoulder but with report of improvement in posterior shoulder pain. Noting that most irritation occurs with overhead lifting activities, thus advised patient to omit these from her HEP, instead opting for pain-free exercises. Patient reported understanding. Worked on R shoulder AAROM to patient's tolerance. Patient also performed periscapular and RTC strengthening with decreased resistance, which patient tolerated well. Applied KT tape to R shoulder as patient reporting benefit from this modality. Ended session with [place] to R shoulder. No complaints at end of session. Plan to progress patient within pain-free tolerance.    Comorbidities  migraine, HTN, concussion, GERD, anxiety    Rehab Potential  Good    PT Treatment/Interventions  ADLs/Self Care Home Management;Cryotherapy;Electrical Stimulation;Iontophoresis 78m/ml Dexamethasone;Moist Heat;Therapeutic exercise;Therapeutic activities;Functional mobility training;Ultrasound;Neuromuscular re-education;Patient/family education;Manual techniques;Vasopneumatic Device;Taping;Energy conservation;Dry needling;Passive range of motion;Scar mobilization    PT Next Visit Plan  monitor for shoulder pain & progress per tolerance; completed week 16 on 10/17/19; continue gentle behind the back stretching and shoulder strength    Consulted and Agree with Plan of Care  Patient       Patient will benefit from skilled therapeutic intervention in order to improve the following deficits and impairments:  Hypomobility, Increased edema, Decreased scar mobility, Decreased activity tolerance, Decreased strength, Impaired UE functional use, Pain, Improper body mechanics, Decreased range of motion, Impaired flexibility, Postural dysfunction  Visit Diagnosis: Acute pain of right shoulder  Stiffness of right shoulder, not elsewhere classified  Muscle weakness (generalized)  Other  symptoms and signs involving the musculoskeletal system     Problem List Patient Active Problem List   Diagnosis Date Noted  . S/P arthroscopy of right shoulder 04/17/2018    YJanene Harvey PT, DPT 10/24/19 3:34 PM   [facility] [address] [place] [address]Phone: [phone]  Fax:  [phone] Name: Monica DESCHLERMRN: [id number] of Birth: 505-Jun-1967

## 2019-10-29 ENCOUNTER — Encounter: Payer: Self-pay | Admitting: Physical Therapy

## 2019-10-29 ENCOUNTER — Other Ambulatory Visit (HOSPITAL_COMMUNITY): Payer: Self-pay | Admitting: Specialist

## 2019-10-29 ENCOUNTER — Other Ambulatory Visit: Payer: Self-pay

## 2019-10-29 ENCOUNTER — Ambulatory Visit: Payer: PRIVATE HEALTH INSURANCE | Admitting: Physical Therapy

## 2019-10-29 DIAGNOSIS — M6281 Muscle weakness (generalized): Secondary | ICD-10-CM

## 2019-10-29 DIAGNOSIS — R29898 Other symptoms and signs involving the musculoskeletal system: Secondary | ICD-10-CM

## 2019-10-29 DIAGNOSIS — M25611 Stiffness of right shoulder, not elsewhere classified: Secondary | ICD-10-CM

## 2019-10-29 DIAGNOSIS — M25511 Pain in right shoulder: Secondary | ICD-10-CM | POA: Diagnosis not present

## 2019-10-29 DIAGNOSIS — Z9889 Other specified postprocedural states: Secondary | ICD-10-CM

## 2019-10-29 NOTE — Addendum Note (Signed)
Addended by: Janene Harvey D on: 10/29/2019 06:23 PM   Modules accepted: Orders

## 2019-10-29 NOTE — Therapy (Addendum)
[facility] [address]  [place], [place], [address] Phone: [phone]   Fax:  [phone]  Physical Therapy Treatment  Patient Details  Name: Monica Petty MRN: [id number] Date of Birth: 09-19-1966 Referring Provider (PT): Sydnee Cabal, MD   Encounter Date: 10/29/2019  PT End of Session - 10/29/19 1616    Visit Number  36    Number of Visits  50    Date for PT Re-Evaluation  12/17/19    Authorization Type  WC- 12 additional visits approved on 10/22/19    Authorization - Visit Number  11    Authorization - Number of Visits  24    PT Start Time  9509    PT Stop Time  1529    PT Time Calculation (min)  42 min    Activity Tolerance  Patient tolerated treatment well;Patient limited by pain    Behavior During Therapy  [facility] for tasks assessed/performed       Past Medical History:  Diagnosis Date  . Anxiety   . Diverticulosis of colon   . GERD (gastroesophageal reflux disease)   . History of colon polyps   . History of concussion    1987  fell of horse--- no residual  . Hypertension   . Migraine    occasional  . Rotator cuff tear    recurrent right side    Past Surgical History:  Procedure Laterality Date  . COLONOSCOPY  06/06/2016  . LAPAROSCOPIC CHOLECYSTECTOMY  06-03-2008   dr Ulyess Blossom  [facility]  . REDUCTION MAMMAPLASTY Bilateral 03-05-2002   dr barber  [facility]  . SHOULDER ARTHROSCOPY WITH ROTATOR CUFF REPAIR Right 05/23/2019   Procedure: SHOULDER ARTHROSCOPY WITH revision rotator cuff repair and  debirdement;  Surgeon: Sydnee Cabal, MD;  Location: [facility];  Service: Orthopedics;  Laterality: Right;  interscalene block  . SHOULDER ARTHROSCOPY WITH ROTATOR CUFF REPAIR AND SUBACROMIAL DECOMPRESSION Right 04/17/2018   Procedure: Right shoulder evaluation under anesthesia, scope, debridement, subacromial decompression, rotator cuff repair;  Surgeon: Sydnee Cabal, MD;  Location: [facility];  Service: Orthopedics;  Laterality: Right;  120 mins General with Intra Scalene Block  . VAGINAL HYSTERECTOMY  02-12-2002   dr Nori Riis  [facility]  . WISDOM TOOTH EXTRACTION  teen    There were no vitals filed for this visit.  Subjective Assessment - 10/29/19 1447    Subjective  "sometimes it hurts, sometimes it doesn't." it just depends on what i do."    Pertinent History  migraine, HTN, concussion, GERD, anxiety    Diagnostic tests  none recent    Patient Stated Goals  get back to 100%    Currently in Pain?  No/denies    Pain Location  --    Pain Orientation  --    Pain Type  --         [facility] PT Assessment - 10/29/19 0001      Assessment   Medical Diagnosis  F/U orthopedic assessment, s/p R RTC revision and debridement     Referring Provider (PT)  Sydnee Cabal, MD    Onset Date/Surgical Date  05/23/19      AROM   Right Shoulder Flexion  140 Degrees    Right Shoulder ABduction  139 Degrees    Right Shoulder Internal Rotation  --   FIR T8   Right Shoulder External Rotation  --   FER C7     PROM   Right Shoulder  Flexion  145 Degrees    Right Shoulder ABduction  140 Degrees    Right Shoulder Internal Rotation  93 Degrees    Right Shoulder External Rotation  70 Degrees      Strength   Right Shoulder Flexion  4+/5    Right Shoulder ABduction  4+/5    Right Shoulder Internal Rotation  4+/5    Right Shoulder External Rotation  4/5                   [facility] Adult PT Treatment/Exercise - 10/29/19 0001      Shoulder Exercises: Supine   Flexion  AROM;Right;10 reps    Flexion Limitations  AROM to tolerance   intermittent anterior      Shoulder Exercises: Prone   Other Prone Exercises  R UE extension with 3# x10 leaning over counter    Other Prone Exercises  R UE row with 3# x15 leaning over counter      Shoulder Exercises: Sidelying   ABduction  AROM;Right;10 reps    ABduction Limitations  thumb up; cues to limit ROM to avoid anterior shoulder pain        Shoulder Exercises: ROM/Strengthening   UBE (Upper Arm Bike)  L2.5 x 3 min forward/3 min back      Modalities   Modalities  Iontophoresis      Iontophoresis   Type of Iontophoresis  Dexamethasone    Location  R anterior shoulder    Dose  80 mA*min; 1.0 ml    Time  6 hour patch      Manual Therapy   Manual Therapy  Soft tissue mobilization;Myofascial release    Manual therapy comments  supine    Soft tissue mobilization  STM to R proximal biceps and biceps muscle belly    Myofascial Release  manual TPR to R biceps muscle belly    Passive ROM  gentle R shoulder PROM in all directions to tolerance             PT Education - 10/29/19 1615    Education Details  edu on iontophoresis wear time, precautions, use    Person(s) Educated  Patient    Methods  Explanation    Comprehension  Verbalized understanding       PT Short Term Goals - 10/29/19 1812      PT SHORT TERM GOAL #1   Title  Patient to be independent with initial HEP.    Time  3    Period  Weeks    Status  Achieved    Target Date  07/04/19        PT Long Term Goals - 10/29/19 1812      PT LONG TERM GOAL #1   Title  patient to be independent with advanced HEP    Time  7    Period  Weeks    Status  Partially Met   compliant with modified HEP performed to tolerance   Target Date  12/17/19      PT LONG TERM GOAL #2   Title  Patient to demonstrate R shoulder AROM/PROM [facility] and without pain limiting.    Time  7    Period  Weeks    Status  Partially Met    Target Date  12/17/19      PT LONG TERM GOAL #3   Title  patient to improve R shoulder strength to >/= 4+/5 without pain    Time  7    Period  Weeks    Status  Partially Met   10/17/19: met for flexion, abduction, IR - however pain on resistance in all directions   Target Date  12/17/19      PT LONG TERM GOAL #4   Title  Patient to demonstrate overhead reaching to place 5# object on shelf overhead with <=1/10 pain.    Time  7    Period   Weeks    Status  Partially Met   not tested today d/t patient recent flare   Target Date  12/17/19      PT LONG TERM GOAL #5   Title  Patient to report tolerance of dressing and grooming activities without pain limiting.    Time  7    Period  Weeks    Status  Partially Met   as previously stated, patient is still experiencing  discomfort with washing and doing her hair; having shoulder pain "pulling up pants".   Target Date  12/17/19            Plan - 10/29/19 1616    Clinical Impression Statement  Patient without new complaints today. Notes that she has removed weighted resistance with HEP to improve her exercise tolerance. Tolerated R shoulder PROM in all directions with slight pain at end ranges of abduction. Patient still demonstrating tender trigger point over mid-biceps muscle which was address with STM and TPR. Able to perform periscapular strengthening with weighted resistance without complaints. Ended session with ionto patch placement over R anterior shoulder for pain relief. Patient reported understanding of edu provided on iontophoresis and without complaints at end of session. See objective measurements taken on 10/17/19 for reference on patient's current abilities. Overhead lifting with weight not tested d/t patient's recent flare. As previously stated, patient is also still experiencing discomfort with washing and doing her hair as well as donning pants. D/t patient's remaining limitations, patient would benefit from continued skilled PT services 2x/week for 7 weeks to address remaining impairments.    Comorbidities  migraine, HTN, concussion, GERD, anxiety    Rehab Potential  Good    PT Frequency  2x / week    PT Duration  --   7 weeks   PT Treatment/Interventions  ADLs/Self Care Home Management;Cryotherapy;Electrical Stimulation;Iontophoresis 52m/ml Dexamethasone;Moist Heat;Therapeutic exercise;Therapeutic activities;Functional mobility training;Ultrasound;Neuromuscular  re-education;Patient/family education;Manual techniques;Vasopneumatic Device;Taping;Energy conservation;Dry needling;Passive range of motion;Scar mobilization    PT Next Visit Plan  monitor for shoulder pain & progress per tolerance; completed week 16 on 10/17/19; continue gentle behind the back stretching and shoulder strength    Consulted and Agree with Plan of Care  Patient       Patient will benefit from skilled therapeutic intervention in order to improve the following deficits and impairments:  Hypomobility, Increased edema, Decreased scar mobility, Decreased activity tolerance, Decreased strength, Impaired UE functional use, Pain, Improper body mechanics, Decreased range of motion, Impaired flexibility, Postural dysfunction  Visit Diagnosis: Acute pain of right shoulder  Stiffness of right shoulder, not elsewhere classified  Muscle weakness (generalized)  Other symptoms and signs involving the musculoskeletal system     Problem List Patient Active Problem List   Diagnosis Date Noted  . S/P arthroscopy of right shoulder 04/17/2018    YJanene Harvey PT, DPT 10/29/19 6:21 PM   [facility] [address] [place] [address]Phone: [phone]  Fax:  [phone] Name: Monica MUZYKAMRN: [id number] of Birth: 509/25/1967

## 2019-10-30 MED FILL — PREMARIN 0.625 MG TABLET: 0.625 | 30 days supply | Qty: 30 | Fill #4

## 2019-10-31 ENCOUNTER — Other Ambulatory Visit: Payer: Self-pay

## 2019-10-31 ENCOUNTER — Ambulatory Visit: Payer: PRIVATE HEALTH INSURANCE | Admitting: Physical Therapy

## 2019-10-31 ENCOUNTER — Encounter: Payer: Self-pay | Admitting: Physical Therapy

## 2019-10-31 DIAGNOSIS — R29898 Other symptoms and signs involving the musculoskeletal system: Secondary | ICD-10-CM

## 2019-10-31 DIAGNOSIS — M6281 Muscle weakness (generalized): Secondary | ICD-10-CM

## 2019-10-31 DIAGNOSIS — M25511 Pain in right shoulder: Secondary | ICD-10-CM

## 2019-10-31 DIAGNOSIS — M25611 Stiffness of right shoulder, not elsewhere classified: Secondary | ICD-10-CM

## 2019-10-31 NOTE — Therapy (Signed)
[facility] [address]  [place], [place], [address] Phone: [phone]   Fax:  [phone]  Physical Therapy Treatment  Patient Details  Name: Monica Petty MRN: [id number] Date of Birth: 04-Apr-1966 Referring Provider (PT): Sydnee Cabal, MD   Encounter Date: 10/31/2019  PT End of Session - 10/31/19 1500    Visit Number  37    Number of Visits  50    Date for PT Re-Evaluation  12/17/19    Authorization Type  WC- 12 additional visits approved on 10/22/19    Authorization - Visit Number  12    Authorization - Number of Visits  24    PT Start Time  8937    PT Stop Time  1443    PT Time Calculation (min)  40 min    Activity Tolerance  Patient tolerated treatment well;Patient limited by pain    Behavior During Therapy  [facility] for tasks assessed/performed       Past Medical History:  Diagnosis Date  . Anxiety   . Diverticulosis of colon   . GERD (gastroesophageal reflux disease)   . History of colon polyps   . History of concussion    1987  fell of horse--- no residual  . Hypertension   . Migraine    occasional  . Rotator cuff tear    recurrent right side    Past Surgical History:  Procedure Laterality Date  . COLONOSCOPY  06/06/2016  . LAPAROSCOPIC CHOLECYSTECTOMY  06-03-2008   dr Ulyess Blossom  [facility]  . REDUCTION MAMMAPLASTY Bilateral 03-05-2002   dr barber  [facility]  . SHOULDER ARTHROSCOPY WITH ROTATOR CUFF REPAIR Right 05/23/2019   Procedure: SHOULDER ARTHROSCOPY WITH revision rotator cuff repair and  debirdement;  Surgeon: Sydnee Cabal, MD;  Location: [facility];  Service: Orthopedics;  Laterality: Right;  interscalene block  . SHOULDER ARTHROSCOPY WITH ROTATOR CUFF REPAIR AND SUBACROMIAL DECOMPRESSION Right 04/17/2018   Procedure: Right shoulder evaluation under anesthesia, scope, debridement, subacromial decompression, rotator cuff repair;  Surgeon: Sydnee Cabal, MD;  Location: [facility];  Service: Orthopedics;  Laterality: Right;  120 mins General with Intra Scalene Block  . VAGINAL HYSTERECTOMY  02-12-2002   dr Nori Riis  [facility]  . WISDOM TOOTH EXTRACTION  teen    There were no vitals filed for this visit.  Subjective Assessment - 10/31/19 1404    Subjective  No changes since last session. No irritation from ionto.    Pertinent History  migraine, HTN, concussion, GERD, anxiety    Diagnostic tests  none recent    Patient Stated Goals  get back to 100%    Currently in Pain?  No/denies                       [facility] Adult PT Treatment/Exercise - 10/31/19 0001      Shoulder Exercises: Supine   Protraction  Strengthening;Both;10 reps    Protraction Weight (lbs)  3    Protraction Limitations  2x10   report of a "stretch" in shoulder; no pain   Flexion  Right;10 reps;AROM    Flexion Limitations  AROM to tolerance      Shoulder Exercises: Seated   External Rotation  Right;10 reps;AAROM    External Rotation Weight (lbs)  with wand to tolerance    Internal Rotation  AAROM;Right;10 reps    Internal Rotation Limitations  with wand to tolerance    Flexion  AAROM;Right;10  reps    Flexion Limitations  with wand     Abduction  AAROM;Right;10 reps    ABduction Limitations  with wand    good ROM     Shoulder Exercises: Sidelying   External Rotation  Right;Strengthening;10 reps    External Rotation Weight (lbs)  0    External Rotation Limitations  --   report of slight tingling in R digits 4-5   ABduction  AROM;Right;10 reps    ABduction Limitations  thumb up; cues to limit ROM to avoid anterior shoulder pain    intermittent cavitation in shoulder     Shoulder Exercises: ROM/Strengthening   UBE (Upper Arm Bike)  L2.5 x 3 min forward/3 min back      Shoulder Exercises: IT sales professional  30 seconds;2 reps    Corner Stretch Limitations  R mid & low pec stretch in doorway    Internal Rotation Stretch  2 reps    Internal Rotation Stretch  Limitations  2x30" cues to push gently    Other Shoulder Stretches  R biceps stretch against wall x30"       [employer]   Create Space  R shoulder 50% stretch on split strip along supraspinatus, 50% stretch on split strip around deltoid   ssensitive skin variety              PT Short Term Goals - 10/29/19 1812      PT SHORT TERM GOAL #1   Title  Patient to be independent with initial HEP.    Time  3    Period  Weeks    Status  Achieved    Target Date  07/04/19        PT Long Term Goals - 10/29/19 1812      PT LONG TERM GOAL #1   Title  patient to be independent with advanced HEP    Time  7    Period  Weeks    Status  Partially Met   compliant with modified HEP performed to tolerance   Target Date  12/17/19      PT LONG TERM GOAL #2   Title  Patient to demonstrate R shoulder AROM/PROM [facility] and without pain limiting.    Time  7    Period  Weeks    Status  Partially Met    Target Date  12/17/19      PT LONG TERM GOAL #3   Title  patient to improve R shoulder strength to >/= 4+/5 without pain    Time  7    Period  Weeks    Status  Partially Met   10/17/19: met for flexion, abduction, IR - however pain on resistance in all directions   Target Date  12/17/19      PT LONG TERM GOAL #4   Title  Patient to demonstrate overhead reaching to place 5# object on shelf overhead with <=1/10 pain.    Time  7    Period  Weeks    Status  Partially Met   not tested today d/t patient recent flare   Target Date  12/17/19      PT LONG TERM GOAL #5   Title  Patient to report tolerance of dressing and grooming activities without pain limiting.    Time  7    Period  Weeks    Status  Partially Met   as previously stated, patient is still experiencing  discomfort with washing and doing her hair; having  shoulder pain "pulling up pants".   Target Date  12/17/19            Plan - 10/31/19 1500    Clinical Impression Statement  Patient without new complaints today.  Reports that she has not seen much benefit from ionto patch last session.  Worked on gentle R shoulder AAROM and AROM with slightly improved ROM and tolerance today. Patient also with report of less frequent cavitation in R anterior shoulder with ROM today, which she reports does typically occur frequently. Able to tolerate serratus punches wit light weight without c/o pain. Did c/o slight sensation of N/T in R digits 4-5 with sidelying ER. Performed gentle chest and shoulder stretching without complaints. Ended session with KT tape to R shoulder for pain relief. No complaints at end of session.    Comorbidities  migraine, HTN, concussion, GERD, anxiety    Rehab Potential  Good    PT Frequency  2x / week    PT Duration  --   7 weeks   PT Treatment/Interventions  ADLs/Self Care Home Management;Cryotherapy;Electrical Stimulation;Iontophoresis 38m/ml Dexamethasone;Moist Heat;Therapeutic exercise;Therapeutic activities;Functional mobility training;Ultrasound;Neuromuscular re-education;Patient/family education;Manual techniques;Vasopneumatic Device;Taping;Energy conservation;Dry needling;Passive range of motion;Scar mobilization    PT Next Visit Plan  monitor for shoulder pain & progress per tolerance; completed week 16 on 10/17/19; continue gentle behind the back stretching and shoulder strength    Consulted and Agree with Plan of Care  Patient       Patient will benefit from skilled therapeutic intervention in order to improve the following deficits and impairments:  Hypomobility, Increased edema, Decreased scar mobility, Decreased activity tolerance, Decreased strength, Impaired UE functional use, Pain, Improper body mechanics, Decreased range of motion, Impaired flexibility, Postural dysfunction  Visit Diagnosis: Acute pain of right shoulder  Stiffness of right shoulder, not elsewhere classified  Muscle weakness (generalized)  Other symptoms and signs involving the musculoskeletal  system     Problem List Patient Active Problem List   Diagnosis Date Noted  . S/P arthroscopy of right shoulder 04/17/2018     YJanene Harvey PT, DPT 10/31/19 3:04 PM   [facility] [address] [place] [address]Phone: [phone]  Fax:  [phone] Name: Monica KELLETTMRN: [id number] of Birth: 5Dec 18, 1967

## 2019-11-05 ENCOUNTER — Ambulatory Visit: Payer: PRIVATE HEALTH INSURANCE

## 2019-11-05 ENCOUNTER — Other Ambulatory Visit: Payer: Self-pay

## 2019-11-05 DIAGNOSIS — M25511 Pain in right shoulder: Secondary | ICD-10-CM | POA: Diagnosis not present

## 2019-11-05 DIAGNOSIS — M6281 Muscle weakness (generalized): Secondary | ICD-10-CM

## 2019-11-05 DIAGNOSIS — R29898 Other symptoms and signs involving the musculoskeletal system: Secondary | ICD-10-CM

## 2019-11-05 DIAGNOSIS — M25611 Stiffness of right shoulder, not elsewhere classified: Secondary | ICD-10-CM

## 2019-11-05 NOTE — Therapy (Signed)
[facility] [address]  [place], [place], [address] Phone: [phone]   Fax:  [phone]  Physical Therapy Treatment  Patient Details  Name: Monica Petty MRN: [id number] Date of Birth: 1965/10/11 Referring Provider (PT): Sydnee Cabal, MD   Encounter Date: 11/05/2019  PT End of Session - 11/05/19 1450    Visit Number  38    Number of Visits  50    Date for PT Re-Evaluation  12/17/19    Authorization Type  WC- 12 additional visits approved on 10/22/19    Authorization - Visit Number  13    Authorization - Number of Visits  24    PT Start Time  1448    PT Stop Time  1533    PT Time Calculation (min)  45 min    Activity Tolerance  Patient tolerated treatment well;Patient limited by pain    Behavior During Therapy  [facility] for tasks assessed/performed       Past Medical History:  Diagnosis Date  . Anxiety   . Diverticulosis of colon   . GERD (gastroesophageal reflux disease)   . History of colon polyps   . History of concussion    1987  fell of horse--- no residual  . Hypertension   . Migraine    occasional  . Rotator cuff tear    recurrent right side    Past Surgical History:  Procedure Laterality Date  . COLONOSCOPY  06/06/2016  . LAPAROSCOPIC CHOLECYSTECTOMY  06-03-2008   dr Ulyess Blossom  [facility]  . REDUCTION MAMMAPLASTY Bilateral 03-05-2002   dr barber  [facility]  . SHOULDER ARTHROSCOPY WITH ROTATOR CUFF REPAIR Right 05/23/2019   Procedure: SHOULDER ARTHROSCOPY WITH revision rotator cuff repair and  debirdement;  Surgeon: Sydnee Cabal, MD;  Location: [facility];  Service: Orthopedics;  Laterality: Right;  interscalene block  . SHOULDER ARTHROSCOPY WITH ROTATOR CUFF REPAIR AND SUBACROMIAL DECOMPRESSION Right 04/17/2018   Procedure: Right shoulder evaluation under anesthesia, scope, debridement, subacromial decompression, rotator cuff repair;  Surgeon: Sydnee Cabal, MD;  Location: [facility];  Service: Orthopedics;  Laterality: Right;  120 mins General with Intra Scalene Block  . VAGINAL HYSTERECTOMY  02-12-2002   dr Nori Riis  [facility]  . WISDOM TOOTH EXTRACTION  teen    There were no vitals filed for this visit.  Subjective Assessment - 11/05/19 1455    Subjective  Pt. reporting she is still waiting to have MRI near end of month.    Pertinent History  migraine, HTN, concussion, GERD, anxiety    Diagnostic tests  none recent    Patient Stated Goals  get back to 100%    Currently in Pain?  Yes    Pain Score  1     Pain Location  Shoulder    Pain Orientation  Right;Anterior    Pain Descriptors / Indicators  Burning    Pain Type  Acute pain;Surgical pain    Aggravating Factors   lifting    Pain Relieving Factors  ice, massage, TENS unit    Multiple Pain Sites  No                       [facility] Adult PT Treatment/Exercise - 11/05/19 0001      Shoulder Exercises: Prone   Extension  Both;15 reps;Weights;Strengthening    Extension Weight (lbs)  1    Extension Limitations  leaning over green p-ball  Shoulder Exercises: Sidelying   External Rotation  Right;15 reps;AROM    External Rotation Weight (lbs)  0    ABduction  AROM;Right;10 reps    ABduction Limitations  thumb up; cues to limit ROM to avoid anterior shoulder pain       Shoulder Exercises: ROM/Strengthening   UBE (Upper Arm Bike)  L2.5 x 3 min forward/3 min back      Shoulder Exercises: IT sales professional  30 seconds;2 reps    Corner Stretch Limitations  R mid & low pec stretch in doorway    Internal Rotation Stretch  2 reps    Internal Rotation Stretch Limitations  2x30" cues to push gently      Manual Therapy   Manual Therapy  Soft tissue mobilization;Myofascial release    Manual therapy comments  supine    Passive ROM  gentle R shoulder PROM in all directions to tolerance      [employer]   Create Space  R shoulder 50% stretch on split strip along supraspinatus, 50%  stretch on split strip around deltoid   sensitive skine tape              PT Short Term Goals - 10/29/19 1812      PT SHORT TERM GOAL #1   Title  Patient to be independent with initial HEP.    Time  3    Period  Weeks    Status  Achieved    Target Date  07/04/19        PT Long Term Goals - 10/29/19 1812      PT LONG TERM GOAL #1   Title  patient to be independent with advanced HEP    Time  7    Period  Weeks    Status  Partially Met   compliant with modified HEP performed to tolerance   Target Date  12/17/19      PT LONG TERM GOAL #2   Title  Patient to demonstrate R shoulder AROM/PROM [facility] and without pain limiting.    Time  7    Period  Weeks    Status  Partially Met    Target Date  12/17/19      PT LONG TERM GOAL #3   Title  patient to improve R shoulder strength to >/= 4+/5 without pain    Time  7    Period  Weeks    Status  Partially Met   10/17/19: met for flexion, abduction, IR - however pain on resistance in all directions   Target Date  12/17/19      PT LONG TERM GOAL #4   Title  Patient to demonstrate overhead reaching to place 5# object on shelf overhead with <=1/10 pain.    Time  7    Period  Weeks    Status  Partially Met   not tested today d/t patient recent flare   Target Date  12/17/19      PT LONG TERM GOAL #5   Title  Patient to report tolerance of dressing and grooming activities without pain limiting.    Time  7    Period  Weeks    Status  Partially Met   as previously stated, patient is still experiencing  discomfort with washing and doing her hair; having shoulder pain "pulling up pants".   Target Date  12/17/19            Plan - 11/05/19 1451    Clinical Impression  Statement  Jeanann reporting she has stopped performing flexion, scaption against gravity HEP activities due to discomfort.  Pt. encouraged to make self-adjustments to HEP as needed for comfort.  Focused session on scapular strengthening and anterior chest  stretching along with re-application of taping to R shoulder for pain relief with overhead motions.  Pt. noting good relief from taping in previous sessions.  Josalyn still with complaint of R shoulder pain ~ 140-150 dg flexion, scaption against gravity AROM and PROM with "burning" sensation reported on superior/anterior shoulder.  All therex performed to pt. tolerance today.    Comorbidities  migraine, HTN, concussion, GERD, anxiety    Rehab Potential  Good    PT Treatment/Interventions  ADLs/Self Care Home Management;Cryotherapy;Electrical Stimulation;Iontophoresis 44m/ml Dexamethasone;Moist Heat;Therapeutic exercise;Therapeutic activities;Functional mobility training;Ultrasound;Neuromuscular re-education;Patient/family education;Manual techniques;Vasopneumatic Device;Taping;Energy conservation;Dry needling;Passive range of motion;Scar mobilization    PT Next Visit Plan  monitor for shoulder pain & progress per tolerance; completed week 18 on 10/31/19; continue gentle behind the back stretching and shoulder strength    Consulted and Agree with Plan of Care  Patient       Patient will benefit from skilled therapeutic intervention in order to improve the following deficits and impairments:  Hypomobility, Increased edema, Decreased scar mobility, Decreased activity tolerance, Decreased strength, Impaired UE functional use, Pain, Improper body mechanics, Decreased range of motion, Impaired flexibility, Postural dysfunction  Visit Diagnosis: Acute pain of right shoulder  Stiffness of right shoulder, not elsewhere classified  Muscle weakness (generalized)  Other symptoms and signs involving the musculoskeletal system     Problem List Patient Active Problem List   Diagnosis Date Noted  . S/P arthroscopy of right shoulder 04/17/2018    MBess Harvest PTA 11/05/19 4:47 PM   [facility] [address] [place]  [address]Phone: [phone]  Fax:  [phone] Name: GKAMALJIT HIZERMRN: [id number] of Birth: [birth year]
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
## 2019-11-09 MED FILL — HYDROCHLOROTHIAZIDE 25 MG T: 25 | 90 days supply | Qty: 90 | Fill #1

## 2019-11-11 ENCOUNTER — Other Ambulatory Visit: Payer: Self-pay

## 2019-11-11 ENCOUNTER — Ambulatory Visit (HOSPITAL_COMMUNITY)
Admission: RE | Admit: 2019-11-11 | Discharge: 2019-11-11 | Disposition: A | Discharge status: Home / Self Care | Payer: PRIVATE HEALTH INSURANCE | Source: Ambulatory Visit | Attending: Specialist | Admitting: Specialist

## 2019-11-11 ENCOUNTER — Other Ambulatory Visit (HOSPITAL_COMMUNITY): Payer: Self-pay | Admitting: Specialist

## 2019-11-11 DIAGNOSIS — Z9889 Other specified postprocedural states: Secondary | ICD-10-CM

## 2019-11-11 DIAGNOSIS — M75121 Complete rotator cuff tear or rupture of right shoulder, not specified as traumatic: Secondary | ICD-10-CM | POA: Diagnosis not present

## 2019-11-11 DIAGNOSIS — M25511 Pain in right shoulder: Secondary | ICD-10-CM | POA: Diagnosis not present

## 2019-11-11 DIAGNOSIS — M12811 Other specific arthropathies, not elsewhere classified, right shoulder: Secondary | ICD-10-CM | POA: Diagnosis not present

## 2019-11-11 MED ORDER — SODIUM CHLORIDE (PF) 0.9 % IJ SOLN
INTRAMUSCULAR | Status: AC
  Administered 2019-11-11: 10 mL
  Filled 2019-11-11: qty 10

## 2019-11-11 MED ORDER — GADOBUTROL 1 MMOL/ML IV SOLN: 0.0500 mL | Freq: Once | INTRAVENOUS | Status: DC | PRN

## 2019-11-11 MED ORDER — LIDOCAINE HCL 1 % IJ SOLN
INTRAMUSCULAR | Status: AC
  Administered 2019-11-11: 12:00:00 5 mL
  Filled 2019-11-11: qty 20

## 2019-11-11 MED ORDER — IOHEXOL 300 MG/ML  SOLN
50.0000 mL | INTRAMUSCULAR | Status: AC
  Administered 2019-11-11: 12:00:00 10 mL

## 2019-11-12 ENCOUNTER — Ambulatory Visit: Payer: PRIVATE HEALTH INSURANCE

## 2019-11-12 DIAGNOSIS — M6281 Muscle weakness (generalized): Secondary | ICD-10-CM

## 2019-11-12 DIAGNOSIS — R29898 Other symptoms and signs involving the musculoskeletal system: Secondary | ICD-10-CM

## 2019-11-12 DIAGNOSIS — M25511 Pain in right shoulder: Secondary | ICD-10-CM

## 2019-11-12 DIAGNOSIS — M25611 Stiffness of right shoulder, not elsewhere classified: Secondary | ICD-10-CM

## 2019-11-12 NOTE — Therapy (Addendum)
[facility] [address]  [place], [place], [address] Phone: [phone]   Fax:  [phone]  Physical Therapy Treatment  Patient Details  Name: Monica Petty MRN: [id number] Date of Birth: [birth year] Referring Provider (PT): Sydnee Cabal, MD   Encounter Date: 11/12/2019  PT End of Session - 11/12/19 1500    Visit Number  39    Number of Visits  50    Date for PT Re-Evaluation  12/17/19    Authorization Type  WC- 12 additional visits approved on 10/22/19    Authorization - Visit Number  14    Authorization - Number of Visits  24    PT Start Time  6761    PT Stop Time  1540    PT Time Calculation (min)  53 min    Activity Tolerance  Patient tolerated treatment well;Patient limited by pain    Behavior During Therapy  [facility] for tasks assessed/performed       Past Medical History:  Diagnosis Date  . Anxiety   . Diverticulosis of colon   . GERD (gastroesophageal reflux disease)   . History of colon polyps   . History of concussion    1987  fell of horse--- no residual  . Hypertension   . Migraine    occasional  . Rotator cuff tear    recurrent right side    Past Surgical History:  Procedure Laterality Date  . COLONOSCOPY  06/06/2016  . LAPAROSCOPIC CHOLECYSTECTOMY  06-03-2008   dr Ulyess Blossom  [facility]  . REDUCTION MAMMAPLASTY Bilateral 03-05-2002   dr barber  [facility]  . SHOULDER ARTHROSCOPY WITH ROTATOR CUFF REPAIR Right 05/23/2019   Procedure: SHOULDER ARTHROSCOPY WITH revision rotator cuff repair and  debirdement;  Surgeon: Sydnee Cabal, MD;  Location: [facility];  Service: Orthopedics;  Laterality: Right;  interscalene block  . SHOULDER ARTHROSCOPY WITH ROTATOR CUFF REPAIR AND SUBACROMIAL DECOMPRESSION Right 04/17/2018   Procedure: Right shoulder evaluation under anesthesia, scope, debridement, subacromial decompression, rotator cuff repair;  Surgeon: Sydnee Cabal, MD;  Location: [facility]  Service: Orthopedics;  Laterality: Right;  120 mins General with Intra Scalene Block  . VAGINAL HYSTERECTOMY  02-12-2002   dr Nori Riis  [facility]  . WISDOM TOOTH EXTRACTION  teen    There were no vitals filed for this visit.  Subjective Assessment - 11/12/19 1501    Subjective  Continues to have pain and "popping" with overhead flexion-type reaching.  Had 7/10 pain hugging husband a few days ago which was "excruciating".    Pertinent History  migraine, HTN, concussion, GERD, anxiety    Diagnostic tests  none recent    Patient Stated Goals  get back to 100%    Currently in Pain?  No/denies    Pain Score  --   "popping" pain up to 7/10 at times with overhead reaching "giving husband a hug"   Pain Location  Shoulder    Pain Descriptors / Indicators  Burning   "popping"   Pain Type  Acute pain;Surgical pain    Aggravating Factors   lifting, hugging husband, turning stearing wheel,    Multiple Pain Sites  No                       [facility] Adult PT Treatment/Exercise - 11/12/19 0001      Shoulder Exercises: Seated   External Rotation  Both;15 reps;Theraband    Theraband Level (Shoulder External  Rotation)  Level 1 (Yellow)    External Rotation Limitations  B shoulder ER seated in chair     Diagonals  Both;15 reps;Strengthening    Diagonals Limitations  seated; scaption       Shoulder Exercises: Standing   Extension  Both;15 reps;Strengthening;Theraband    Theraband Level (Shoulder Extension)  Level 2 (Red)    Row  15 reps;Theraband;Strengthening    Theraband Level (Shoulder Row)  Level 3 (Green)      Shoulder Exercises: ROM/Strengthening   UBE (Upper Arm Bike)  L3.0 x 3 min forward/3 min back    Lat Pull  10 reps    Lat Pull Limitations  15# - narrow grip     Cybex Row  10 reps    Cybex Row Limitations  low handles - 20#     Pushups  15 reps    Pushups Limitations  x 15 serrus punch on wall + green TB in B UE and around upper back       Manual Therapy    Manual Therapy  Soft tissue mobilization;Myofascial release    Manual therapy comments  supine and sidelying     Soft tissue mobilization  STM to R shoulder complex, L infraspinatus, L teres minor - ttp;  DTM to R UT - palpable TP - tenderness;  R proximal biceps STM    Myofascial Release  Manual TPR to proximal biceps - ttp with palpable TP               PT Short Term Goals - 10/29/19 1812      PT SHORT TERM GOAL #1   Title  Patient to be independent with initial HEP.    Time  3    Period  Weeks    Status  Achieved    Target Date  07/04/19        PT Long Term Goals - 10/29/19 1812      PT LONG TERM GOAL #1   Title  patient to be independent with advanced HEP    Time  7    Period  Weeks    Status  Partially Met   compliant with modified HEP performed to tolerance   Target Date  12/17/19      PT LONG TERM GOAL #2   Title  Patient to demonstrate R shoulder AROM/PROM [facility] and without pain limiting.    Time  7    Period  Weeks    Status  Partially Met    Target Date  12/17/19      PT LONG TERM GOAL #3   Title  patient to improve R shoulder strength to >/= 4+/5 without pain    Time  7    Period  Weeks    Status  Partially Met   10/17/19: met for flexion, abduction, IR - however pain on resistance in all directions   Target Date  12/17/19      PT LONG TERM GOAL #4   Title  Patient to demonstrate overhead reaching to place 5# object on shelf overhead with <=1/10 pain.    Time  7    Period  Weeks    Status  Partially Met   not tested today d/t patient recent flare   Target Date  12/17/19      PT LONG TERM GOAL #5   Title  Patient to report tolerance of dressing and grooming activities without pain limiting.    Time  7    Period  Weeks    Status  Partially Met   as previously stated, patient is still experiencing  discomfort with washing and doing her hair; having shoulder pain "pulling up pants".   Target Date  12/17/19            Plan - 11/12/19 1504     Clinical Impression Statement  Pt. reporting her recent MRI of R shoulder showed torn supraspinatus.  Pt. visibly upset regarding MRI results and reports that she continues with "popping" and pain with overhead flexion-type reaching activities.  Sees MD to discuss MRI tomorrow.  Scheduled to return to therapy on Thursday.  Session focused on scapular stabilization strengthening activities along with RTC ER strengthening to pt. tolerance.  Avoided painful activities in today's session.  Ended visit pain free.  Will plan to f/u with pt. in regards to therapy after she sees MD.    Comorbidities  migraine, HTN, concussion, GERD, anxiety    Rehab Potential  Good    PT Treatment/Interventions  ADLs/Self Care Home Management;Cryotherapy;Electrical Stimulation;Iontophoresis 46m/ml Dexamethasone;Moist Heat;Therapeutic exercise;Therapeutic activities;Functional mobility training;Ultrasound;Neuromuscular re-education;Patient/family education;Manual techniques;Vasopneumatic Device;Taping;Energy conservation;Dry needling;Passive range of motion;Scar mobilization    PT Next Visit Plan  monitor for shoulder pain & progress per tolerance; completed week 18 on 10/31/19; continue gentle behind the back stretching and shoulder strength    Consulted and Agree with Plan of Care  Patient       Patient will benefit from skilled therapeutic intervention in order to improve the following deficits and impairments:  Hypomobility, Increased edema, Decreased scar mobility, Decreased activity tolerance, Decreased strength, Impaired UE functional use, Pain, Improper body mechanics, Decreased range of motion, Impaired flexibility, Postural dysfunction  Visit Diagnosis: Acute pain of right shoulder  Stiffness of right shoulder, not elsewhere classified  Muscle weakness (generalized)  Other symptoms and signs involving the musculoskeletal system     Problem List Patient Active Problem List   Diagnosis Date Noted  . S/P  arthroscopy of right shoulder 04/17/2018    MBess Harvest PTA 11/12/19 3:59 PM   [facility] [address] [place] [address]Phone: [phone]  Fax:  [phone] Name: GGRISELA MESCHMRN: [id number] of Birth: 509-Jan-1967  PHYSICAL THERAPY DISCHARGE SUMMARY  Visits from Start of Care: 39  Current functional level related to goals / functional outcomes: Unable to assess; patient did not return d/t undergoing a RTC revision   Remaining deficits: Unable to assess   Education / Equipment: HEP  Plan: Patient agrees to discharge.  Patient goals were partially met. Patient is being discharged due to a change in medical status.  ?????     YJanene Harvey PT, DPT 12/17/19 1:44 PM

## 2019-11-14 ENCOUNTER — Ambulatory Visit: Payer: PRIVATE HEALTH INSURANCE | Admitting: Physical Therapy

## 2019-11-18 ENCOUNTER — Other Ambulatory Visit (HOSPITAL_COMMUNITY): Payer: Self-pay | Admitting: Nurse Practitioner

## 2019-11-18 DIAGNOSIS — I10 Essential (primary) hypertension: Secondary | ICD-10-CM | POA: Diagnosis not present

## 2019-11-18 DIAGNOSIS — H04123 Dry eye syndrome of bilateral lacrimal glands: Secondary | ICD-10-CM | POA: Diagnosis not present

## 2019-11-18 MED FILL — RESTASIS 0.05% EYE EMULSION: 0.05 | 90 days supply | Qty: 180 | Fill #0

## 2019-11-18 MED FILL — LISINOPRIL 5 MG TABS: 5 | 90 days supply | Qty: 90 | Fill #0

## 2019-11-19 ENCOUNTER — Encounter: Payer: 59 | Admitting: Physical Therapy

## 2019-11-27 MED FILL — PREMARIN 0.625 MG TABLET: 0.625 | 30 days supply | Qty: 30 | Fill #5

## 2019-11-28 ENCOUNTER — Other Ambulatory Visit: Payer: Self-pay

## 2019-11-28 ENCOUNTER — Encounter (HOSPITAL_BASED_OUTPATIENT_CLINIC_OR_DEPARTMENT_OTHER): Payer: Self-pay | Admitting: Specialist

## 2019-11-28 NOTE — Progress Notes (Addendum)
Spoke w/ via phone for pre-op interview---Monica Petty needs dos----   I stat 8           Petty results------ekg 05-23-2019 epic COVID test ------12-03-2019 @245  pm Arrive at -------1100 am 12-05-2019 No food after midnight, clear liquids until 1000 am then npo Medications to take morning of surgery -----premarin, eye drop Diabetic medication -----n/a Patient Special Instructions ----- Pre-Op special Istructions ----- Patient verbalized understanding of instructions that were given at this phone interview. Patient denies shortness of breath, chest pain, fever, cough a this phone interview.

## 2019-11-28 NOTE — H&P (View-Only) (Signed)
Spoke w/ via phone for pre-op interview---Monica Petty needs dos----   I stat 8           Petty results------ekg 05-23-2019 epic COVID test ------12-03-2019 @245  pm Arrive at -------1100 am 12-05-2019 No food after midnight, clear liquids until 1000 am then npo Medications to take morning of surgery -----premarin, eye drop Diabetic medication -----n/a Patient Special Instructions ----- Pre-Op special Istructions ----- Patient verbalized understanding of instructions that were given at this phone interview. Patient denies shortness of breath, chest pain, fever, cough a this phone interview.

## 2019-11-29 NOTE — H&P (Signed)
Monica Petty is an 54 y.o. female.   Chief Complaint: Right shoulder pain HPI: this is a pleasant right-hand dominant 54 year old female with a previous history of a right rotator cuff repair 2.  Most recently her last surgery was in beginning of September.  She has just not noticed the improvement like she did after her previous rotator cuff surgery.  She is having a lot of pain in the shoulder with movement and even at rest.  An MRI arthrogram was ordered to evaluate the integrity of the rotator and it revealed a new rotator cuff tear.  Past Medical History:  Diagnosis Date  . Complication of anesthesia    sob after 05-23-2019 sx for 1-2 days after got home  . Diverticulosis of colon   . GERD (gastroesophageal reflux disease)    hx of  . History of colon polyps   . History of concussion    1987  fell of horse--- no residual  . Hypertension   . Migraine    occasional  . Rosacea   . Rotator cuff tear    recurrent right side    Past Surgical History:  Procedure Laterality Date  . COLONOSCOPY  06/06/2016  . LAPAROSCOPIC CHOLECYSTECTOMY  06-03-2008   dr Ulyess Blossom  [facility]  . REDUCTION MAMMAPLASTY Bilateral 03-05-2002   dr barber  [facility]  . SHOULDER ARTHROSCOPY WITH ROTATOR CUFF REPAIR Right 05/23/2019   Procedure: SHOULDER ARTHROSCOPY WITH revision rotator cuff repair and  debirdement;  Surgeon: Sydnee Cabal, MD;  Location: [facility];  Service: Orthopedics;  Laterality: Right;  interscalene block  . SHOULDER ARTHROSCOPY WITH ROTATOR CUFF REPAIR AND SUBACROMIAL DECOMPRESSION Right 04/17/2018   Procedure: Right shoulder evaluation under anesthesia, scope, debridement, subacromial decompression, rotator cuff repair;  Surgeon: Sydnee Cabal, MD;  Location: [facility];  Service: Orthopedics;  Laterality: Right;  120 mins General with Intra Scalene Block  . VAGINAL HYSTERECTOMY  02-12-2002   dr Nori Riis  [facility]  . WISDOM TOOTH EXTRACTION  teen    Family History   Problem Relation Age of Onset  . Colon polyps Mother   . Breast cancer Paternal Aunt   . Breast cancer Other   . Colon cancer Neg Hx    Social History:  reports that she has never smoked. She has never used smokeless tobacco. She reports previous alcohol use of about 7.0 standard drinks of alcohol per week. She reports that she does not use drugs.  Allergies:  Allergies  Allergen Reactions  . Adhesive [Tape]     Rips skin, likes paper tape  . Codeine Other (See Comments)    tinnitis and photophobia   . Sulfa Antibiotics Other (See Comments)    tinnitis and photophobia  . Penicillins Rash    No medications prior to admission.    No results found for this or any previous visit (from the past 48 hour(s)). No results found.  Review of Systems  All other systems reviewed and are negative.   Height 5\' 6"  (1.676 m), weight 88 kg. Physical Exam  Musculoskeletal:     Comments: On examination of the right shoulder, she has forward elevation 140 degrees with painful arc of motion between 90 and 140 degrees. She has abduction to 120 degrees, with pain between 90 and 120 degrees. External rotation strength is markedly painful and weak. Internal rotation strength is good. Biceps appears unremarkable.  NVI    MRI arthrogram of the right shoulder is reviewed and shows quite a bit  of partial tearing of the rotator cuff revision repair and what may be a small, recurrent full thickness tear. No muscle atrophy. Biceps is intact. No arthrosis of the glenohumeral joint. The cuff appears thin and tenuous.  Assessment/Plan Right shoulder recurrent rotator cuff tear -Patient has a recurrent rotator cuff tear of  The right shoulder. We are going to be doing a right shoulder arthroscopy debridement and revision of rotator cuff repair.  Risks and benefits have been discussed with the  Patient in the office.  Surgical versus nonsurgical management has been  Discussed.  All questions were encouraged  and answered.  Drue Novel, PA 11/29/2019, 1:17 PM

## 2019-12-03 ENCOUNTER — Other Ambulatory Visit (HOSPITAL_COMMUNITY)
Admission: RE | Admit: 2019-12-03 | Discharge: 2019-12-03 | Disposition: A | Discharge status: Home / Self Care | Payer: 59 | Source: Ambulatory Visit | Attending: Specialist | Admitting: Specialist

## 2019-12-03 ENCOUNTER — Ambulatory Visit: Payer: PRIVATE HEALTH INSURANCE

## 2019-12-03 DIAGNOSIS — Z01812 Encounter for preprocedural laboratory examination: Secondary | ICD-10-CM | POA: Insufficient documentation

## 2019-12-03 DIAGNOSIS — Z20822 Contact with and (suspected) exposure to covid-19: Secondary | ICD-10-CM | POA: Diagnosis not present

## 2019-12-03 LAB — SARS CORONAVIRUS 2 (TAT 6-24 HRS): SARS Coronavirus 2: NEGATIVE

## 2019-12-04 NOTE — Anesthesia Preprocedure Evaluation (Addendum)
Anesthesia Evaluation  Patient identified by MRN, date of birth, ID band Patient awake    Reviewed: Allergy & Precautions, NPO status , Patient's Chart, lab work & pertinent test results  History of Anesthesia Complications (+) history of anesthetic complications  Airway Mallampati: II  TM Distance: >3 FB Neck ROM: Full    Dental no notable dental hx. (+) Teeth Intact   Pulmonary neg pulmonary ROS,    Pulmonary exam normal breath sounds clear to auscultation       Cardiovascular hypertension, Pt. on medications negative cardio ROS Normal cardiovascular exam Rhythm:Regular Rate:Normal     Neuro/Psych  Headaches, negative psych ROS   GI/Hepatic negative GI ROS, Neg liver ROS, GERD  Medicated,  Endo/Other  negative endocrine ROS  Renal/GU negative Renal ROS  negative genitourinary   Musculoskeletal negative musculoskeletal ROS (+)   Abdominal (+) + obese,   Peds negative pediatric ROS (+)  Hematology negative hematology ROS (+)   Anesthesia Other Findings   Reproductive/Obstetrics negative OB ROS                            Anesthesia Physical Anesthesia Plan  ASA: II  Anesthesia Plan: General   Post-op Pain Management: GA combined w/ Regional for post-op pain   Induction:   PONV Risk Score and Plan: 3 and Ondansetron, Dexamethasone and Treatment may vary due to age or medical condition  Airway Management Planned: Oral ETT and LMA  Additional Equipment:   Intra-op Plan:   Post-operative Plan: Extubation in OR  Informed Consent: I have reviewed the patients History and Physical, chart, labs and discussed the procedure including the risks, benefits and alternatives for the proposed anesthesia with the patient or authorized representative who has indicated his/her understanding and acceptance.     Dental advisory given  Plan Discussed with: Anesthesiologist, CRNA and  Surgeon  Anesthesia Plan Comments: (Discussed both nerve block for pain relief post-op and GA; including NV, sore throat, dental injury, and pulmonary complications)        Anesthesia Quick Evaluation                                  Anesthesia Evaluation  Patient identified by MRN, date of birth, ID band Patient awake    Reviewed: Allergy & Precautions, NPO status , Patient's Chart, lab work & pertinent test results  Airway Mallampati: II  TM Distance: >3 FB Neck ROM: Full    Dental  (+) Teeth Intact, Dental Advisory Given   Pulmonary neg pulmonary ROS,    Pulmonary exam normal breath sounds clear to auscultation       Cardiovascular hypertension, Pt. on medications Normal cardiovascular exam Rhythm:Regular Rate:Normal     Neuro/Psych  Headaches, PSYCHIATRIC DISORDERS Anxiety    GI/Hepatic Neg liver ROS, GERD  ,  Endo/Other  negative endocrine ROSObesity   Renal/GU negative Renal ROS     Musculoskeletal negative musculoskeletal ROS (+) Right shoulder recurrent rotator cuff tear   Abdominal   Peds  Hematology negative hematology ROS (+)   Anesthesia Other Findings Day of surgery medications reviewed with the patient.  Reproductive/Obstetrics                             Anesthesia Physical Anesthesia Plan  ASA: II  Anesthesia Plan: General   Post-op Pain Management:  Regional  for Post-op pain   Induction: Intravenous  PONV Risk Score and Plan: 3 and Midazolam, Dexamethasone and Ondansetron  Airway Management Planned: Oral ETT  Additional Equipment:   Intra-op Plan:   Post-operative Plan: Extubation in OR  Informed Consent: I have reviewed the patients History and Physical, chart, labs and discussed the procedure including the risks, benefits and alternatives for the proposed anesthesia with the patient or authorized representative who has indicated his/her understanding and acceptance.     Dental  advisory given  Plan Discussed with: CRNA  Anesthesia Plan Comments:         Anesthesia Quick Evaluation

## 2019-12-05 ENCOUNTER — Encounter (HOSPITAL_BASED_OUTPATIENT_CLINIC_OR_DEPARTMENT_OTHER): Payer: Self-pay | Admitting: Specialist

## 2019-12-05 ENCOUNTER — Other Ambulatory Visit: Payer: Self-pay

## 2019-12-05 ENCOUNTER — Encounter (HOSPITAL_BASED_OUTPATIENT_CLINIC_OR_DEPARTMENT_OTHER)
Admission: RE | Disposition: A | Discharge status: Home / Self Care | Payer: Self-pay | Source: Home / Self Care | Attending: Specialist

## 2019-12-05 ENCOUNTER — Ambulatory Visit (HOSPITAL_BASED_OUTPATIENT_CLINIC_OR_DEPARTMENT_OTHER): Payer: PRIVATE HEALTH INSURANCE | Admitting: Anesthesiology

## 2019-12-05 ENCOUNTER — Ambulatory Visit (HOSPITAL_BASED_OUTPATIENT_CLINIC_OR_DEPARTMENT_OTHER)
Admission: RE | Admit: 2019-12-05 | Discharge: 2019-12-05 | Disposition: A | Discharge status: Home / Self Care | Payer: PRIVATE HEALTH INSURANCE | Attending: Specialist | Admitting: Specialist

## 2019-12-05 DIAGNOSIS — L719 Rosacea, unspecified: Secondary | ICD-10-CM | POA: Diagnosis not present

## 2019-12-05 DIAGNOSIS — I10 Essential (primary) hypertension: Secondary | ICD-10-CM | POA: Insufficient documentation

## 2019-12-05 DIAGNOSIS — Z803 Family history of malignant neoplasm of breast: Secondary | ICD-10-CM | POA: Diagnosis not present

## 2019-12-05 DIAGNOSIS — Z880 Allergy status to penicillin: Secondary | ICD-10-CM | POA: Insufficient documentation

## 2019-12-05 DIAGNOSIS — Z8371 Family history of colonic polyps: Secondary | ICD-10-CM | POA: Diagnosis not present

## 2019-12-05 DIAGNOSIS — Z86010 Personal history of colonic polyps: Secondary | ICD-10-CM | POA: Diagnosis not present

## 2019-12-05 DIAGNOSIS — Z888 Allergy status to other drugs, medicaments and biological substances status: Secondary | ICD-10-CM | POA: Diagnosis not present

## 2019-12-05 DIAGNOSIS — G43909 Migraine, unspecified, not intractable, without status migrainosus: Secondary | ICD-10-CM | POA: Insufficient documentation

## 2019-12-05 DIAGNOSIS — G8918 Other acute postprocedural pain: Secondary | ICD-10-CM | POA: Diagnosis not present

## 2019-12-05 DIAGNOSIS — Z9049 Acquired absence of other specified parts of digestive tract: Secondary | ICD-10-CM | POA: Diagnosis not present

## 2019-12-05 DIAGNOSIS — K219 Gastro-esophageal reflux disease without esophagitis: Secondary | ICD-10-CM | POA: Insufficient documentation

## 2019-12-05 DIAGNOSIS — K5790 Diverticulosis of intestine, part unspecified, without perforation or abscess without bleeding: Secondary | ICD-10-CM | POA: Diagnosis not present

## 2019-12-05 DIAGNOSIS — M75101 Unspecified rotator cuff tear or rupture of right shoulder, not specified as traumatic: Secondary | ICD-10-CM | POA: Insufficient documentation

## 2019-12-05 DIAGNOSIS — Z90710 Acquired absence of both cervix and uterus: Secondary | ICD-10-CM | POA: Insufficient documentation

## 2019-12-05 DIAGNOSIS — M7501 Adhesive capsulitis of right shoulder: Secondary | ICD-10-CM | POA: Insufficient documentation

## 2019-12-05 DIAGNOSIS — Z882 Allergy status to sulfonamides status: Secondary | ICD-10-CM | POA: Diagnosis not present

## 2019-12-05 DIAGNOSIS — Z885 Allergy status to narcotic agent status: Secondary | ICD-10-CM | POA: Diagnosis not present

## 2019-12-05 DIAGNOSIS — Z9889 Other specified postprocedural states: Secondary | ICD-10-CM | POA: Insufficient documentation

## 2019-12-05 DIAGNOSIS — Z6831 Body mass index (BMI) 31.0-31.9, adult: Secondary | ICD-10-CM | POA: Insufficient documentation

## 2019-12-05 DIAGNOSIS — E669 Obesity, unspecified: Secondary | ICD-10-CM | POA: Insufficient documentation

## 2019-12-05 DIAGNOSIS — T8489XA Other specified complication of internal orthopedic prosthetic devices, implants and grafts, initial encounter: Secondary | ICD-10-CM | POA: Diagnosis not present

## 2019-12-05 HISTORY — DX: Rosacea, unspecified: L71.9

## 2019-12-05 HISTORY — DX: Other complications of anesthesia, initial encounter: T88.59XA

## 2019-12-05 HISTORY — PX: SHOULDER ARTHROSCOPY WITH ROTATOR CUFF REPAIR: SHX5685

## 2019-12-05 LAB — POCT I-STAT, CHEM 8
BUN: 15 mg/dL (ref 6–20)
Calcium, Ion: 1.24 mmol/L (ref 1.15–1.40)
Chloride: 100 mmol/L (ref 98–111)
Creatinine, Ser: 0.6 mg/dL (ref 0.44–1.00)
Glucose, Bld: 92 mg/dL (ref 70–99)
HCT: 43 % (ref 36.0–46.0)
Hemoglobin: 14.6 g/dL (ref 12.0–15.0)
Potassium: 3.7 mmol/L (ref 3.5–5.1)
Sodium: 139 mmol/L (ref 135–145)
TCO2: 27 mmol/L (ref 22–32)

## 2019-12-05 SURGERY — ARTHROSCOPY, SHOULDER, WITH ROTATOR CUFF REPAIR
Anesthesia: General | Site: Shoulder | Laterality: Right

## 2019-12-05 MED ORDER — DEXAMETHASONE SODIUM PHOSPHATE 4 MG/ML IJ SOLN
INTRAMUSCULAR | Status: DC | PRN
  Administered 2019-12-05: 10 mg via INTRAVENOUS

## 2019-12-05 MED ORDER — ACETAMINOPHEN 160 MG/5ML PO SOLN
325.0000 mg | ORAL | Status: DC | PRN
  Filled 2019-12-05: qty 20.3

## 2019-12-05 MED ORDER — FENTANYL CITRATE (PF) 100 MCG/2ML IJ SOLN
25.0000 ug | INTRAMUSCULAR | Status: DC | PRN
  Filled 2019-12-05: qty 1

## 2019-12-05 MED ORDER — ONDANSETRON HCL 4 MG PO TABS: 4.0000 mg | ORAL_TABLET | Freq: Every day | ORAL | 1 refills | Status: AC | PRN

## 2019-12-05 MED ORDER — ACETAMINOPHEN 325 MG PO TABS
325.0000 mg | ORAL_TABLET | ORAL | Status: DC | PRN
  Filled 2019-12-05: qty 2

## 2019-12-05 MED ORDER — CEPHALEXIN 500 MG PO CAPS: 500.0000 mg | ORAL_CAPSULE | Freq: Four times a day (QID) | ORAL | 0 refills | Status: AC

## 2019-12-05 MED ORDER — LIDOCAINE HCL (CARDIAC) PF 100 MG/5ML IV SOSY
PREFILLED_SYRINGE | INTRAVENOUS | Status: DC | PRN
  Administered 2019-12-05: 80 mg via INTRAVENOUS

## 2019-12-05 MED ORDER — METHOCARBAMOL 500 MG PO TABS: 500.0000 mg | ORAL_TABLET | Freq: Four times a day (QID) | ORAL | 0 refills | Status: DC

## 2019-12-05 MED ORDER — CEFAZOLIN SODIUM-DEXTROSE 2-4 GM/100ML-% IV SOLN
2.0000 g | INTRAVENOUS | Status: AC
  Administered 2019-12-05: 2 g via INTRAVENOUS
  Filled 2019-12-05: qty 100

## 2019-12-05 MED ORDER — ROCURONIUM BROMIDE 10 MG/ML (PF) SYRINGE
PREFILLED_SYRINGE | INTRAVENOUS | Status: AC
  Filled 2019-12-05: qty 10

## 2019-12-05 MED ORDER — MEPERIDINE HCL 25 MG/ML IJ SOLN
6.2500 mg | INTRAMUSCULAR | Status: DC | PRN
  Filled 2019-12-05: qty 1

## 2019-12-05 MED ORDER — MIDAZOLAM HCL 2 MG/2ML IJ SOLN
INTRAMUSCULAR | Status: AC
  Filled 2019-12-05: qty 2

## 2019-12-05 MED ORDER — LACTATED RINGERS IV SOLN
INTRAVENOUS | Status: DC
  Filled 2019-12-05: qty 1000

## 2019-12-05 MED ORDER — ONDANSETRON HCL 4 MG/2ML IJ SOLN
4.0000 mg | Freq: Once | INTRAMUSCULAR | Status: DC | PRN
  Filled 2019-12-05: qty 2

## 2019-12-05 MED ORDER — SUGAMMADEX SODIUM 200 MG/2ML IV SOLN
INTRAVENOUS | Status: DC | PRN
  Administered 2019-12-05: 200 mg via INTRAVENOUS

## 2019-12-05 MED ORDER — OXYCODONE HCL 5 MG/5ML PO SOLN
5.0000 mg | Freq: Once | ORAL | Status: DC | PRN
  Filled 2019-12-05: qty 5

## 2019-12-05 MED ORDER — SODIUM CHLORIDE (PF) 0.9 % IJ SOLN
INTRAMUSCULAR | Status: DC | PRN
  Administered 2019-12-05: 16:00:00 1 mL via INTRAMUSCULAR

## 2019-12-05 MED ORDER — ROCURONIUM BROMIDE 100 MG/10ML IV SOLN
INTRAVENOUS | Status: DC | PRN
  Administered 2019-12-05: 20 mg via INTRAVENOUS
  Administered 2019-12-05: 60 mg via INTRAVENOUS

## 2019-12-05 MED ORDER — ONDANSETRON HCL 4 MG/2ML IJ SOLN
INTRAMUSCULAR | Status: DC | PRN
  Administered 2019-12-05: 4 mg via INTRAVENOUS

## 2019-12-05 MED ORDER — FENTANYL CITRATE (PF) 100 MCG/2ML IJ SOLN
25.0000 ug | INTRAMUSCULAR | Status: DC | PRN
  Administered 2019-12-05: 100 ug via INTRAVENOUS
  Filled 2019-12-05: qty 1

## 2019-12-05 MED ORDER — OXYCODONE HCL 5 MG PO TABS: 5.0000 mg | ORAL_TABLET | ORAL | 0 refills | Status: AC | PRN

## 2019-12-05 MED ORDER — CHLORHEXIDINE GLUCONATE 4 % EX LIQD
60.0000 mL | Freq: Once | CUTANEOUS | Status: DC
  Filled 2019-12-05: qty 118

## 2019-12-05 MED ORDER — DEXAMETHASONE SODIUM PHOSPHATE 10 MG/ML IJ SOLN
INTRAMUSCULAR | Status: AC
  Filled 2019-12-05: qty 1

## 2019-12-05 MED ORDER — OXYCODONE HCL 5 MG PO TABS
5.0000 mg | ORAL_TABLET | Freq: Once | ORAL | Status: DC | PRN
  Filled 2019-12-05: qty 1

## 2019-12-05 MED ORDER — SODIUM CHLORIDE 0.9 % IR SOLN
Status: DC | PRN
  Administered 2019-12-05: 1000 mL

## 2019-12-05 MED ORDER — PROPOFOL 10 MG/ML IV BOLUS
INTRAVENOUS | Status: AC
  Filled 2019-12-05: qty 40

## 2019-12-05 MED ORDER — FENTANYL CITRATE (PF) 100 MCG/2ML IJ SOLN
INTRAMUSCULAR | Status: AC
  Filled 2019-12-05: qty 2

## 2019-12-05 MED ORDER — MIDAZOLAM HCL 2 MG/2ML IJ SOLN
0.5000 mg | Freq: Once | INTRAMUSCULAR | Status: AC
  Administered 2019-12-05: 2 mg via INTRAVENOUS
  Filled 2019-12-05: qty 2

## 2019-12-05 MED ORDER — CEFAZOLIN SODIUM-DEXTROSE 2-4 GM/100ML-% IV SOLN
INTRAVENOUS | Status: AC
  Filled 2019-12-05: qty 100

## 2019-12-05 MED ORDER — SCOPOLAMINE 1 MG/3DAYS TD PT72
MEDICATED_PATCH | TRANSDERMAL | Status: AC
  Filled 2019-12-05: qty 1

## 2019-12-05 MED ORDER — BUPIVACAINE HCL (PF) 0.5 % IJ SOLN
INTRAMUSCULAR | Status: DC | PRN
  Administered 2019-12-05: 10 mL via PERINEURAL

## 2019-12-05 MED ORDER — PROPOFOL 10 MG/ML IV BOLUS
INTRAVENOUS | Status: DC | PRN
  Administered 2019-12-05: 50 mg via INTRAVENOUS
  Administered 2019-12-05: 200 mg via INTRAVENOUS

## 2019-12-05 MED ORDER — FENTANYL CITRATE (PF) 100 MCG/2ML IJ SOLN
INTRAMUSCULAR | Status: DC | PRN
  Administered 2019-12-05 (×4): 25 ug via INTRAVENOUS
  Administered 2019-12-05: 100 ug via INTRAVENOUS

## 2019-12-05 MED ORDER — EPHEDRINE 5 MG/ML INJ
INTRAVENOUS | Status: AC
  Filled 2019-12-05: qty 10

## 2019-12-05 MED ORDER — BUPIVACAINE LIPOSOME 1.3 % IJ SUSP
INTRAMUSCULAR | Status: DC | PRN
  Administered 2019-12-05: 10 mL via PERINEURAL

## 2019-12-05 MED ORDER — ONDANSETRON HCL 4 MG/2ML IJ SOLN
INTRAMUSCULAR | Status: AC
  Filled 2019-12-05: qty 2

## 2019-12-05 MED ORDER — LIDOCAINE 2% (20 MG/ML) 5 ML SYRINGE
INTRAMUSCULAR | Status: AC
  Filled 2019-12-05: qty 5

## 2019-12-05 MED ORDER — SODIUM CHLORIDE 0.9 % IV SOLN
INTRAVENOUS | Status: DC | PRN
  Administered 2019-12-05: 10 mL via INTRAMUSCULAR

## 2019-12-05 MED ORDER — WHITE PETROLATUM EX OINT
TOPICAL_OINTMENT | CUTANEOUS | Status: AC
  Filled 2019-12-05: qty 5

## 2019-12-05 MED ORDER — SCOPOLAMINE 1 MG/3DAYS TD PT72
MEDICATED_PATCH | TRANSDERMAL | Status: DC | PRN
  Administered 2019-12-05: 1 via TRANSDERMAL

## 2019-12-05 MED ORDER — EPHEDRINE SULFATE 50 MG/ML IJ SOLN
INTRAMUSCULAR | Status: DC | PRN
  Administered 2019-12-05 (×2): 10 mg via INTRAVENOUS
  Administered 2019-12-05: 5 mg via INTRAVENOUS

## 2019-12-05 MED ORDER — MIDAZOLAM HCL 5 MG/5ML IJ SOLN
INTRAMUSCULAR | Status: DC | PRN
  Administered 2019-12-05: 2 mg via INTRAVENOUS

## 2019-12-05 MED FILL — CEPHALEXIN 500 MG CAPSULE: 500 | 3 days supply | Qty: 12 | Fill #0

## 2019-12-05 MED FILL — METHOCARBAMOL 500 MG TABS: 500 | 10 days supply | Qty: 40 | Fill #0

## 2019-12-05 MED FILL — oxyCODONE HCL 5 MG TABS: 5 | 6 days supply | Qty: 40 | Fill #0

## 2019-12-05 MED FILL — ONDANSETRON HCL 4 MG TABLET: 4 | 30 days supply | Qty: 30 | Fill #0

## 2019-12-05 SURGICAL SUPPLY — 98 items
ANCH SUT SWLK 19.1X4.75 VT (Anchor) ×1 IMPLANT
ANCH SUT SWLK 19.1X6.25 CLS (Anchor) ×1 IMPLANT
ANCHOR PEEK 4.75X19.1 SWLK C (Anchor) ×2 IMPLANT
ANCHOR SUT SWIVELLOK BIO (Anchor) ×2 IMPLANT
BLADE EXCALIBUR 4.0MM X 13CM (MISCELLANEOUS) ×1
BLADE EXCALIBUR 4.0X13 (MISCELLANEOUS) ×2 IMPLANT
BLADE EXCALIBUR 5.0MM X 13CM (MISCELLANEOUS) ×1
BLADE EXCALIBUR 5.0X13 (MISCELLANEOUS) ×2 IMPLANT
BLADE SURG 15 STRL LF DISP TIS (BLADE) IMPLANT
BLADE SURG 15 STRL SS (BLADE) ×3
BNDG COHESIVE 3X5 TAN STRL LF (GAUZE/BANDAGES/DRESSINGS) ×2 IMPLANT
BUR SURG 4D 13L RD FLUTE (BUR) IMPLANT
BURR OVAL 8 FLU 5.0MM X 13CM (MISCELLANEOUS)
BURR OVAL 8 FLU 5.0X13 (MISCELLANEOUS) IMPLANT
BURR SURG 4D 13CML RD FLUTE (BUR) ×1
BURR SURG 4D 13L RD FLUTE (BUR) ×2
CANNULA 5.75X7 CRYSTAL CLEAR (CANNULA) ×3 IMPLANT
CANNULA 5.75X71 LONG (CANNULA) IMPLANT
CANNULA TWIST IN 8.25X7CM (CANNULA) IMPLANT
CONNECTOR 5 IN 1 STRAIGHT STRL (MISCELLANEOUS) ×1 IMPLANT
COVER WAND RF STERILE (DRAPES) ×1 IMPLANT
DECANTER SPIKE VIAL GLASS SM (MISCELLANEOUS) ×1 IMPLANT
DRAPE ORTHO SPLIT 77X108 STRL (DRAPES)
DRAPE POUCH INSTRU U-SHP 10X18 (DRAPES) ×3 IMPLANT
DRAPE SHEET LG 3/4 BI-LAMINATE (DRAPES) ×3 IMPLANT
DRAPE SHOULDER BEACH CHAIR (DRAPES) ×2 IMPLANT
DRAPE STERI 35X30 U-POUCH (DRAPES) ×1 IMPLANT
DRAPE SURG 17X23 STRL (DRAPES) ×3 IMPLANT
DRAPE SURG ORHT 6 SPLT 77X108 (DRAPES) ×2 IMPLANT
DRAPE U-SHAPE 47X51 STRL (DRAPES) ×3 IMPLANT
DRSG AQUACEL AG ADV 3.5X10 (GAUZE/BANDAGES/DRESSINGS) ×2 IMPLANT
DRSG PAD ABDOMINAL 8X10 ST (GAUZE/BANDAGES/DRESSINGS) ×3 IMPLANT
DURAPREP 26ML APPLICATOR (WOUND CARE) ×3 IMPLANT
DW OUTFLOW CASSETTE/TUBE SET (MISCELLANEOUS) ×3 IMPLANT
ELECT NDL BLADE 2-5/6 (NEEDLE) IMPLANT
ELECT NEEDLE BLADE 2-5/6 (NEEDLE) ×3 IMPLANT
ELECT REM PT RETURN 9FT ADLT (ELECTROSURGICAL) ×3
ELECTRODE REM PT RTRN 9FT ADLT (ELECTROSURGICAL) ×1 IMPLANT
FIBER TAPE 2MM (SUTURE) IMPLANT
FIBERSTICK 2 (SUTURE) IMPLANT
GAUZE SPONGE 4X4 12PLY STRL (GAUZE/BANDAGES/DRESSINGS) ×3 IMPLANT
GAUZE XEROFORM 1X8 LF (GAUZE/BANDAGES/DRESSINGS) ×3 IMPLANT
GEL DBM ALLOSYNC 1CC (Bone Implant) ×2 IMPLANT
GLOVE BIO SURGEON STRL SZ7 (GLOVE) ×3 IMPLANT
GLOVE BIO SURGEON STRL SZ8 (GLOVE) ×3 IMPLANT
GLOVE BIOGEL PI IND STRL 7.5 (GLOVE) ×1 IMPLANT
GLOVE BIOGEL PI INDICATOR 7.5 (GLOVE) ×2
GLOVE INDICATOR 8.0 STRL GRN (GLOVE) ×3 IMPLANT
GOWN STRL REUS W/TWL XL LVL3 (GOWN DISPOSABLE) ×6 IMPLANT
GRAFT STRIP 15X40X3 (Tissue) ×2 IMPLANT
IV NS IRRIG 3000ML ARTHROMATIC (IV SOLUTION) ×12 IMPLANT
KIT BONE MRW ASP ANGEL CPRP (KITS) ×2 IMPLANT
KIT TURNOVER CYSTO (KITS) ×3 IMPLANT
LASSO SUT 90 DEGREE (SUTURE) IMPLANT
MANIFOLD NEPTUNE II (INSTRUMENTS) ×3 IMPLANT
NDL MAYO 6 CRC TAPER PT (NEEDLE) IMPLANT
NDL MAYO CATGUT SZ4 TPR NDL (NEEDLE) IMPLANT
NDL SCORPION MULTI FIRE (NEEDLE) IMPLANT
NDL SPNL 18GX3.5 QUINCKE PK (NEEDLE) IMPLANT
NEEDLE HYPO 22GX1.5 SAFETY (NEEDLE) IMPLANT
NEEDLE MAYO 6 CRC TAPER PT (NEEDLE) IMPLANT
NEEDLE MAYO CATGUT SZ4 (NEEDLE) IMPLANT
NEEDLE SCORPION MULTI FIRE (NEEDLE) ×3 IMPLANT
NEEDLE SPNL 18GX3.5 QUINCKE PK (NEEDLE) ×3 IMPLANT
NS IRRIG 500ML POUR BTL (IV SOLUTION) IMPLANT
PACK ARTHROSCOPY DSU (CUSTOM PROCEDURE TRAY) ×3 IMPLANT
PACK BASIN DAY SURGERY FS (CUSTOM PROCEDURE TRAY) ×3 IMPLANT
PAD ARMBOARD 7.5X6 YLW CONV (MISCELLANEOUS) IMPLANT
PENCIL BUTTON HOLSTER BLD 10FT (ELECTRODE) ×2 IMPLANT
PORT APPOLLO RF 90DEGREE MULTI (SURGICAL WAND) ×3 IMPLANT
SLEEVE ARM SUSPENSION SYSTEM (MISCELLANEOUS) ×1 IMPLANT
SLING S3 LATERAL DISP (MISCELLANEOUS) IMPLANT
SLING ULTRA II L (ORTHOPEDIC SUPPLIES) ×3 IMPLANT
SLING ULTRA II MEDIUM (SOFTGOODS) ×2 IMPLANT
SPONGE LAP 4X18 RFD (DISPOSABLE) ×2 IMPLANT
SUT ETHILON 3 0 PS 1 (SUTURE) ×3 IMPLANT
SUT FIBERWIRE #2 38 T-5 BLUE (SUTURE)
SUT LASSO 45 DEGREE LEFT (SUTURE) IMPLANT
SUT LASSO 45D RIGHT (SUTURE) IMPLANT
SUT PDS AB 0 CT1 36 (SUTURE) IMPLANT
SUT TIGER TAPE 7 IN WHITE (SUTURE) ×2 IMPLANT
SUT VIC AB 0 CT1 36 (SUTURE) ×2 IMPLANT
SUT VIC AB 1 CT1 36 (SUTURE) ×2 IMPLANT
SUT VIC AB 2-0 CT1 27 (SUTURE) ×6
SUT VIC AB 2-0 CT1 TAPERPNT 27 (SUTURE) IMPLANT
SUTURE FIBERWR #2 38 T-5 BLUE (SUTURE) IMPLANT
SUTURE TAPE 1.3 40 TPR END (SUTURE) IMPLANT
SUTURETAPE 1.3 40 TPR END (SUTURE) ×3
SYR BULB IRRIGATION 50ML (SYRINGE) ×2 IMPLANT
SYR CONTROL 10ML LL (SYRINGE) ×2 IMPLANT
TAPE FIBER 2MM 7IN #2 BLUE (SUTURE) IMPLANT
TAPE SUT LABRALTAP WHT/BLK (SUTURE) IMPLANT
TOWEL OR 17X26 10 PK STRL BLUE (TOWEL DISPOSABLE) ×3 IMPLANT
TUBE CONNECTING 12'X1/4 (SUCTIONS) ×2
TUBE CONNECTING 12X1/4 (SUCTIONS) ×4 IMPLANT
TUBING ARTHROSCOPY IRRIG 16FT (MISCELLANEOUS) ×3 IMPLANT
WATER STERILE IRR 500ML POUR (IV SOLUTION) ×3 IMPLANT
YANKAUER SUCT BULB TIP NO VENT (SUCTIONS) ×2 IMPLANT

## 2019-12-05 NOTE — Progress Notes (Signed)
Assisted Dr. Ambrose Pancoast with right, ultrasound guided, interscalene  block. Side rails up, monitors on throughout procedure. See vital signs in flow sheet. Tolerated Procedure well.

## 2019-12-05 NOTE — Interval H&P Note (Signed)
History and Physical Interval Note:  12/05/2019 12:40 PM  Monica Petty  has presented today for surgery, with the diagnosis of Right shoulder recurrent rotator cuff tear.  The various methods of treatment have been discussed with the patient and family. After consideration of risks, benefits and other options for treatment, the patient has consented to  Procedure(s) with comments: SHOULDER ARTHROSCOPY debridement revision rotator cuff repair (Right) - exparel interscalene block as a surgical intervention.  The patient's history has been reviewed, patient examined, no change in status, stable for surgery.  I have reviewed the patient's chart and labs.  Questions were answered to the patient's satisfaction.     [name] ANDREW

## 2019-12-05 NOTE — Anesthesia Procedure Notes (Signed)
Procedure Name: Intubation Date/Time: 12/05/2019 1:01 PM Performed by: Wanita Chamberlain, CRNA Pre-anesthesia Checklist: Timeout performed, Patient being monitored, Suction available, Emergency Drugs available and Patient identified Patient Re-evaluated:Patient Re-evaluated prior to induction Oxygen Delivery Method: Circle system utilized Preoxygenation: Pre-oxygenation with 100% oxygen Induction Type: IV induction Ventilation: Mask ventilation without difficulty Grade View: Grade II Tube size: 7.0 mm Number of attempts: 1 Airway Equipment and Method: Stylet Placement Confirmation: breath sounds checked- equal and bilateral,  CO2 detector,  positive ETCO2 and ETT inserted through vocal cords under direct vision Secured at: 22 cm Tube secured with: Tape Dental Injury: Teeth and Oropharynx as per pre-operative assessment

## 2019-12-05 NOTE — Discharge Instructions (Signed)
Post Anesthesia Home Care Instructions  Activity: Get plenty of rest for the remainder of the day. A responsible adult should stay with you for 24 hours following the procedure.  For the next 24 hours, DO NOT: -Drive a car -Paediatric nurse -Drink alcoholic beverages -Take any medication unless instructed by your physician -Make any legal decisions or sign important papers.  Meals: Start with liquid foods such as gelatin or soup. Progress to regular foods as tolerated. Avoid greasy, spicy, heavy foods. If nausea and/or vomiting occur, drink only clear liquids until the nausea and/or vomiting subsides. Call your physician if vomiting continues.  Special Instructions/Symptoms: Your throat may feel dry or sore from the anesthesia or the breathing tube placed in your throat during surgery. If this causes discomfort, gargle with warm salt water. The discomfort should disappear within 24 hours.  If you had a scopolamine patch placed behind your ear for the management of post- operative nausea and/or vomiting:  1. The medication in the patch is effective for 72 hours, after which it should be removed.  Wrap patch in a tissue and discard in the trash. Wash hands thoroughly with soap and water. 2. You may remove the patch earlier than 72 hours if you experience unpleasant side effects which may include dry mouth, dizziness or visual disturbances. 3. Avoid touching the patch. Wash your hands with soap and water after contact with the patch.    Shoulder Arthroscopy, Care After This sheet gives you information about how to care for yourself after your procedure. Your health care provider may also give you more specific instructions. If you have problems or questions, contact your health care provider. What can I expect after the procedure? After the procedure, it is common to have:  Pain that can be relieved by taking pain medicine.  Swelling.  A small amount of fluid from the  incision.  Stiffness that improves over time. Follow these instructions at home: If you have a sling or immobilizer:  Wear the sling or immobilizer as told by your health care provider. Remove it only as told by your health care provider. These devices protect your shoulder and help it heal by keeping it in place.  Loosen the sling or immobilizer if your fingers tingle, become numb, or turn cold and blue.  Keep the sling or immobilizer clean.  Ask if you may remove the sling or immobilizer for bathing. If you need to keep it on while bathing and it is not waterproof: ? Do not let it get wet. ? Cover it with a watertight covering when you take a bath or a shower. Incision care   Follow instructions from your health care provider about how to take care of your incisions. Make sure you: ? Wash your hands with soap and water before you change your bandage (dressing). If soap and water are not available, use hand sanitizer. ? Change your dressing as told by your health care provider. ? Leave stitches (sutures), staples, skin glue, or adhesive strips in place. These skin closures may need to stay in place for 2 weeks or longer. If adhesive strip edges start to loosen and curl up, you may trim the loose edges. Do not remove adhesive strips completely unless your health care provider tells you to do that.  Check your incision areas every day for signs of infection. Check for: ? Redness ? More swelling or pain. ? Blood or more fluid. ? Warmth. ? Pus or a bad smell. Bathing  Do  not take baths, swim, or use a hot tub until your health care provider approves. Ask your health care provider if you may take showers. You may only be allowed to take sponge baths. Activity  Ask your health care provider what activities are safe for you during recovery, and ask what activities you need to avoid.  Do not lift with your affected shoulder until your health care provider approves.  Avoid pulling and  pushing with the arm on your affected side.  If physical therapy was prescribed, do exercises as directed. Doing exercises may help to improve shoulder movement and flexibility (range of motion). Driving  Do not drive until your health care provider approves.  Do not drive or use heavy machinery while taking prescription pain medicine. Managing pain, stiffness, and swelling   If lying down flat causes shoulder discomfort, it may help to sleep in a sitting position for a few days after your procedure. Try sleeping in a reclining chair or propping yourself up with extra pillows in bed.  If directed, put ice on the affected area: ? Put ice in a plastic bag or use the icing device (cold therapy unit) that you were given. Follow instructions from your health care provider about how to use the icing device. ? Place a towel between your skin and the bag or between your skin and the icing device. ? Leave the ice on for 20 minutes, 2-3 times a day.  Move your fingers often to avoid stiffness and to lessen swelling. General instructions  Take over-the-counter and prescription medicines only as told by your health care provider.  If you are taking prescription pain medicine, take actions to prevent or treat constipation. Your health care provider may recommend that you: ? Drink enough fluid to keep your urine pale yellow. ? Eat foods that are high in fiber, such as fresh fruits and vegetables, whole grains, and beans. ? Limit foods that are high in fat and processed sugars, such as fried or sweet foods. ? Take an over-the-counter or prescription medicine for constipation.  Do not use any products that contain nicotine or tobacco, such as cigarettes and e-cigarettes. These can delay incision or bone healing. If you need help quitting, ask your health care provider.  Keep all follow-up visits as told by your health care provider. This is important. Contact a health care provider if you:  Have a  fever.  Have severe pain.  Have redness around an incision.  Have more swelling or pain in an incision area.  Have blood or more fluid coming from an incision.  Notice that an incision feels warm to the touch.  Notice pus or a bad smell coming from an incision.  Notice that an incision has opened up.  Develop a rash. Get help right away if you:  Have difficulty breathing.  Have chest pain.  Notice that your fingers tingle, are numb, or are cold and blue even after you loosen your sling or immobilizer.  Develop pain in your lower leg or at the back of your knee. Summary  If you have a sling or immobilizer, wear it as told by your health care provider. These devices protect your shoulder and help it heal by keeping it in place.  If lying down flat causes shoulder discomfort, it may help to sleep in a sitting position for a few days after your procedure. Try sleeping in a reclining chair, or try propping yourself up with extra pillows in bed.  If physical  therapy was prescribed, do exercises as directed. Doing exercises may help to improve shoulder movement and flexibility (range of motion).  Keep all follow-up visits as told by your health care provider. This is important. This information is not intended to replace advice given to you by your health care provider. Make sure you discuss any questions you have with your health care provider. Document Revised: 08/18/2017 Document Reviewed: 07/21/2017 Elsevier Patient Education  [place] dressing in  Three days

## 2019-12-05 NOTE — Op Note (Signed)
Dictated#[id number]

## 2019-12-05 NOTE — Op Note (Signed)
NAME: Monica Petty, LAHTI MEDICAL RECORD [id number] ACCOUNT [id number] DATE OF BIRTH:08-21-1966 FACILITY: [facility] LOCATION: [facility] PHYSICIAN:Ryana Montecalvo Gwinda Passe, MD  OPERATIVE REPORT  DATE OF PROCEDURE:  12/05/2019  PREOPERATIVE DIAGNOSES:  Right shoulder recurrent rotator cuff tear.  POSTOPERATIVE DIAGNOSES:  Right shoulder recurrent 1.3 cm recurrent rotator cuff tear with loose lateral row anchor.  PROCEDURE: 1.  Right shoulder arthroscopy. 2.  Open removal of previous hardware anchor. 3.  Revision rotator cuff repair. 4.  Augmented with 3 mL bone marrow aspirate concentrate with additional cancellous bone sponge.  SURGEON:  Sydnee Cabal, MD  ASSISTANT:  Elizabeth Sauer, PA-C.  ANESTHESIA:  Interscalene block, general.  ESTIMATED BLOOD LOSS:  Less than 100 mL.  DRAINS:  None.  COMPLICATIONS:  None.  DISPOSITION:  PACU, stable.  DESCRIPTION OF PROCEDURE:  The patient was encountered in the holding area.  Correct site was identified, marked and signed appropriately.  IV started.  Sedation was given.  A block was administered.  IV antibiotics were given within 1 hour of the  surgical incision time.  She was then taken to the operating room, placed in the supine position, general anesthesia, then gently placed in a modified beach chair position.  Right shoulder was gently ranged and had full passive range of motion.  She was  then prepped with DuraPrep and draped into a sterile fashion.  Time-out done confirming the right side.  Posterior portal was created and arthroscope was placed into the shoulder.  Immediately identifiable was a small 1.3 cm recurrent tear of the rotator  cuff insertion site with associated loose lateral row anchor.  After confirming this, there was no intra-articular pathology encountered.  Subacromial adhesions were released.  Scope was removed.  A lateral incision was made through the skin and  subcutaneous tissue.  The deltoid was split for a total of 4  cm.  A suture was placed after to protect the axillary nerve, which was ____  and protected throughout the entire case.  The humerus was distracted inferiorly.  I then mobilized the rotator  cuff, confirmed the size of the tear.  The old anchor was removed.  After excising the recurrent bursa, the Arthrex Jamshidi needle was placed into the old lateral row anchor hole to the appropriate depth.  At this time, 60 mL of bone marrow aspirate was  then taken, taken back to the [facility] machine and centrifuged down for a total of 3.5 mL of bone marrow aspirate concentrate.  During this time period, the rotator cuff was mobilized.  At the insertion site, we got down to bleeding bone with a rongeur.   We chose and cut appropriately a cancellous bone sponge from Arthrex.  After the [facility] concentrate was appropriately harvested, it was soaked into the cancellous bone sponge.  Following this, an anchor was placed as the medial row anchor.  Two of the  Fiber sutures were then placed through the cancellous bone sponge with the accompanied BMAC concentrate interposed.  This was then taken down to the repair site and greater tuberosity.  The suture was then placed through the rotator cuff at the  appropriate level.  Then, 2 other sutures were placed, 1 more through the cancellous bone sponge and 1 anterior to that.  The middle 2 were then tied down nicely, bringing the rotator cuff repair down to the repair site, which was covered with bleeding  bone and the cancellous bone sponge soaked in BMAC.  All sutures were then placed into a 6.0 anchor.  This was then tapped and put into position for the lateral row.  This was augmented by one of the safety sutures.  At this point in time, we had an  excellent repair of the rotator cuff down to bleeding bone, supplemented with BMAC concentrate soaked cancellous bone sponge with excellent repair of the rotator cuff.  There are no other abnormalities noted.  It was irrigated.  I also  placed StimuBlast  into the lateral row hole after the [facility] aspirate had been harvested.  Each layer was irrigated during the closure.  The inferior suture was taken down the deltoid to protect the axillary nerve.  The axillary nerve was protected.  The deltoid was closed  with running Vicryl suture.  Subcutaneous with Vicryl, skin with a subcuticular Monocryl suture, posterior portal with nylon.  Dermabond was applied, along with an Aquacel and a dry dressing posteriorly.  She was then awakened.  She was placed in a  shoulder abduction sling, taken from the operating room to the PACU in stable condition.  She will be stabilized in the PACU and discharged home.  To help with patient positioning, prepping, draping, exploration, aspiration concentrate closure of cuff,  suture management, closure of skin and application of dressing and sponge, Leanne Haus assistance was needed throughout the entire case.    She will be stabilized in PACU and discharged to home.  VN/NUANCE  D:12/05/2019 T:12/05/2019 JOB:[id number]

## 2019-12-05 NOTE — Anesthesia Procedure Notes (Signed)
Anesthesia Regional Block: Interscalene brachial plexus block   Pre-Anesthetic Checklist: ,, timeout performed, Correct Patient, Correct Site, Correct Laterality, Correct Procedure, Correct Position, site marked, Risks and benefits discussed,  Surgical consent,  Pre-op evaluation,  At surgeon's request and post-op pain management  Laterality: Right  Prep: chloraprep       Needles:  Injection technique: Single-shot  Needle Type: Echogenic Stimulator Needle     Needle Length: 5cm  Needle Gauge: 22     Additional Needles:   Procedures:, nerve stimulator,,, ultrasound used (permanent image in chart),,,,   Nerve Stimulator or Paresthesia:  Response: hand, 0.45 mA,   Additional Responses:   Narrative:  Start time: 12/05/2019 12:20 PM End time: 12/05/2019 12:25 PM Injection made incrementally with aspirations every 5 mL.  Performed by: Personally  Anesthesiologist: Janeece Riggers, MD  Additional Notes: Functioning IV was confirmed and monitors were applied.  A 4mm 22ga Arrow echogenic stimulator needle was used. Sterile prep and drape,hand hygiene and sterile gloves were used. Ultrasound guidance: relevant anatomy identified, needle position confirmed, local anesthetic spread visualized around nerve(s)., vascular puncture avoided.  Image printed for medical record. Negative aspiration and negative test dose prior to incremental administration of local anesthetic. The patient tolerated the procedure well.

## 2019-12-05 NOTE — Transfer of Care (Signed)
Immediate Anesthesia Transfer of Care Note  Patient: Monica Petty  Procedure(s) Performed: SHOULDER ARTHROSCOPY debridement revision rotator cuff repair, , with bone marrow aspiration from humerus (Right )  Patient Location: PACU  Anesthesia Type:General  Level of Consciousness: awake, alert , oriented and patient cooperative  Airway & Oxygen Therapy: Patient Spontanous Breathing and Patient connected to nasal cannula oxygen  Post-op Assessment: Report given to RN and Post -op Vital signs reviewed and stable  Post vital signs: Reviewed and stable  Last Vitals:  Vitals Value Taken Time  BP    Temp    Pulse    Resp 15 12/05/19 1520  SpO2    Vitals shown include unvalidated device data.  Last Pain:  Vitals:   12/05/19 1150  TempSrc: Oral         Complications: No apparent anesthesia complications

## 2019-12-08 NOTE — Anesthesia Postprocedure Evaluation (Signed)
Anesthesia Post Note  Patient: Monica Petty  Procedure(s) Performed: SHOULDER ARTHROSCOPY debridement revision rotator cuff repair, , with bone marrow aspiration from humerus (Right Shoulder)     Patient location during evaluation: PACU Anesthesia Type: General Level of consciousness: awake and alert Pain management: pain level controlled Vital Signs Assessment: post-procedure vital signs reviewed and stable Respiratory status: spontaneous breathing, nonlabored ventilation, respiratory function stable and patient connected to nasal cannula oxygen Cardiovascular status: blood pressure returned to baseline and stable Postop Assessment: no apparent nausea or vomiting Anesthetic complications: no    Last Vitals:  Vitals:   12/05/19 1615 12/05/19 1645  BP: 122/61 120/66  Pulse: (!) 106 100  Resp: 13 16  Temp:  36.8 C  SpO2: 94% 96%    Last Pain:  Vitals:   12/06/19 1339  TempSrc:   PainSc: 0-No pain   Pain Goal:                   Billye Nydam

## 2019-12-09 MED FILL — ESTRADIOL 0.1 MG/GM CRM: 0.1 | 90 days supply | Qty: 43 | Fill #1

## 2019-12-10 ENCOUNTER — Encounter: Payer: 59 | Admitting: Physical Therapy

## 2019-12-10 ENCOUNTER — Encounter: Payer: Self-pay | Admitting: Physical Therapy

## 2019-12-10 MED FILL — ALPRAZolam 0.5 MG TABS: 0.5 | 30 days supply | Qty: 90 | Fill #0

## 2019-12-26 MED FILL — PREMARIN 0.625 MG TABLET: 0.625 | 30 days supply | Qty: 30 | Fill #6

## 2020-02-07 MED FILL — HYDROCHLOROTHIAZIDE 25 MG T: 25 | 90 days supply | Qty: 90 | Fill #0

## 2020-02-26 ENCOUNTER — Other Ambulatory Visit (HOSPITAL_COMMUNITY): Payer: Self-pay | Admitting: Obstetrics & Gynecology

## 2020-02-26 DIAGNOSIS — Z01419 Encounter for gynecological examination (general) (routine) without abnormal findings: Secondary | ICD-10-CM | POA: Diagnosis not present

## 2020-02-26 DIAGNOSIS — Z1231 Encounter for screening mammogram for malignant neoplasm of breast: Secondary | ICD-10-CM | POA: Diagnosis not present

## 2020-02-26 DIAGNOSIS — Z6830 Body mass index (BMI) 30.0-30.9, adult: Secondary | ICD-10-CM | POA: Diagnosis not present

## 2020-03-03 MED FILL — LISINOPRIL 5 MG TABLET: 5 | 90 days supply | Qty: 90 | Fill #1

## 2020-04-18 MED FILL — ALPRAZolam 0.5 MG TABS: 0.5 | 30 days supply | Qty: 90 | Fill #1

## 2020-05-07 MED FILL — HYDROCHLOROTHIAZIDE 25 MG T: 25 | 90 days supply | Qty: 90 | Fill #1

## 2020-05-26 ENCOUNTER — Other Ambulatory Visit (HOSPITAL_COMMUNITY): Payer: Self-pay | Admitting: Internal Medicine

## 2020-05-26 MED FILL — LISINOPRIL 5 MG TABLET: 5 | 90 days supply | Qty: 90 | Fill #1

## 2020-05-26 MED FILL — PREMARIN 0.625 MG TABLET: 0.625 | 90 days supply | Qty: 90 | Fill #1

## 2020-07-24 ENCOUNTER — Other Ambulatory Visit (HOSPITAL_COMMUNITY): Payer: Self-pay | Admitting: Internal Medicine

## 2020-07-24 DIAGNOSIS — I10 Essential (primary) hypertension: Secondary | ICD-10-CM | POA: Diagnosis not present

## 2020-07-24 DIAGNOSIS — G2581 Restless legs syndrome: Secondary | ICD-10-CM | POA: Diagnosis not present

## 2020-07-24 DIAGNOSIS — Z9889 Other specified postprocedural states: Secondary | ICD-10-CM | POA: Diagnosis not present

## 2020-07-24 DIAGNOSIS — E6609 Other obesity due to excess calories: Secondary | ICD-10-CM | POA: Diagnosis not present

## 2020-07-24 DIAGNOSIS — Z6830 Body mass index (BMI) 30.0-30.9, adult: Secondary | ICD-10-CM | POA: Diagnosis not present

## 2020-07-24 DIAGNOSIS — Z23 Encounter for immunization: Secondary | ICD-10-CM | POA: Diagnosis not present

## 2020-07-24 DIAGNOSIS — G4700 Insomnia, unspecified: Secondary | ICD-10-CM | POA: Diagnosis not present

## 2020-07-24 MED FILL — rOPINIRole HCL 0.25 MG TABS: 0.25 | 90 days supply | Qty: 90 | Fill #0

## 2020-07-28 DIAGNOSIS — E6609 Other obesity due to excess calories: Secondary | ICD-10-CM | POA: Diagnosis not present

## 2020-07-28 DIAGNOSIS — Z1329 Encounter for screening for other suspected endocrine disorder: Secondary | ICD-10-CM | POA: Diagnosis not present

## 2020-07-28 DIAGNOSIS — R252 Cramp and spasm: Secondary | ICD-10-CM | POA: Diagnosis not present

## 2020-07-28 DIAGNOSIS — Z13220 Encounter for screening for lipoid disorders: Secondary | ICD-10-CM | POA: Diagnosis not present

## 2020-07-28 DIAGNOSIS — I10 Essential (primary) hypertension: Secondary | ICD-10-CM | POA: Diagnosis not present

## 2020-07-28 DIAGNOSIS — Z6832 Body mass index (BMI) 32.0-32.9, adult: Secondary | ICD-10-CM | POA: Diagnosis not present

## 2020-07-28 DIAGNOSIS — Z111 Encounter for screening for respiratory tuberculosis: Secondary | ICD-10-CM | POA: Diagnosis not present

## 2020-07-28 DIAGNOSIS — G2581 Restless legs syndrome: Secondary | ICD-10-CM | POA: Diagnosis not present

## 2020-07-30 ENCOUNTER — Other Ambulatory Visit (HOSPITAL_COMMUNITY): Payer: Self-pay | Admitting: Obstetrics & Gynecology

## 2020-07-30 MED FILL — ESTRADIOL 0.1 MG/GM CREA: 0.1 | 90 days supply | Qty: 43 | Fill #0

## 2020-08-06 ENCOUNTER — Other Ambulatory Visit (HOSPITAL_COMMUNITY): Payer: Self-pay | Admitting: Internal Medicine

## 2020-08-06 MED FILL — HYDROCHLOROTHIAZIDE 25 MG T: 25 | 90 days supply | Qty: 90 | Fill #0

## 2020-08-18 DIAGNOSIS — H5213 Myopia, bilateral: Secondary | ICD-10-CM | POA: Diagnosis not present

## 2020-08-18 DIAGNOSIS — H52223 Regular astigmatism, bilateral: Secondary | ICD-10-CM | POA: Diagnosis not present

## 2020-08-22 MED FILL — PREMARIN 0.625 MG TABLET: 0.625 | 90 days supply | Qty: 90 | Fill #2

## 2020-08-31 MED FILL — LISINOPRIL 5 MG TABLET: 5 | 90 days supply | Qty: 90 | Fill #0

## 2020-10-12 ENCOUNTER — Other Ambulatory Visit (HOSPITAL_COMMUNITY): Payer: Self-pay | Admitting: Physician Assistant

## 2020-10-12 MED FILL — RESTASIS 0.05% EYE EMULSION: 0.05 | 90 days supply | Qty: 180 | Fill #1

## 2020-10-26 ENCOUNTER — Other Ambulatory Visit (HOSPITAL_COMMUNITY): Payer: Self-pay | Admitting: Internal Medicine

## 2020-10-26 MED FILL — rOPINIRole HCL 0.25 MG TABS: 0.25 | 90 days supply | Qty: 90 | Fill #0

## 2020-11-02 MED FILL — METHOCARBAMOL 500 MG TABS: 500 | 15 days supply | Qty: 60 | Fill #0

## 2020-11-02 MED FILL — HYDROCHLOROTHIAZIDE 25 MG T: 25 | 90 days supply | Qty: 90 | Fill #1

## 2020-11-19 IMAGING — MR MRI OF THE RIGHT SHOULDER WITH CONTRAST
7 series · 39 of 40 positions shown · IV contrast (agent unspecified)
Comparison: MRI right shoulder dated February 13, 2018.

CLINICAL DATA: Right shoulder pain and limited range of motion
since work injury. Prior rotator cuff repair in March 2018.

EXAM:
MR ARTHROGRAM OF THE RIGHT SHOULDER
TECHNIQUE: Multiplanar, multisequence MR imaging of the right shoulder was
performed following the administration of intra-articular contrast.
CONTRAST:  See Injection Documentation.

[Series 5: PD fat-sat · axial · right · 4.0mm · 0.36mm/px · z∈[-87,+28]mm · 6 of 25 slices shown]
[im 1/25]
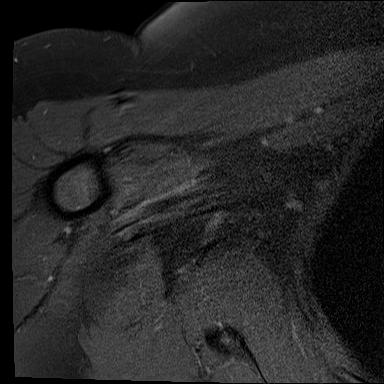
[im 5/25]
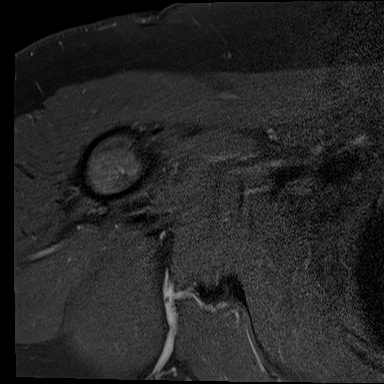
[im 10/25]
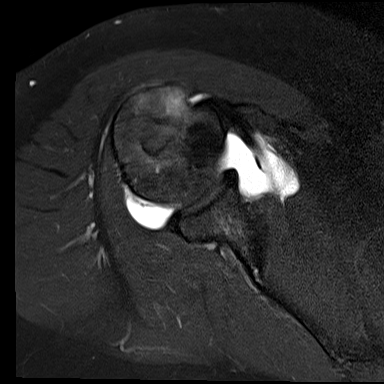
[im 15/25]
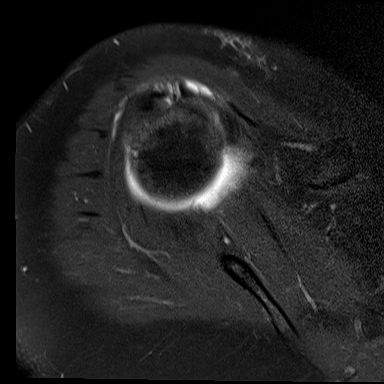
[im 20/25]
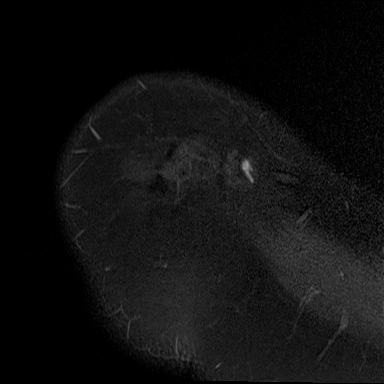
[im 25/25]
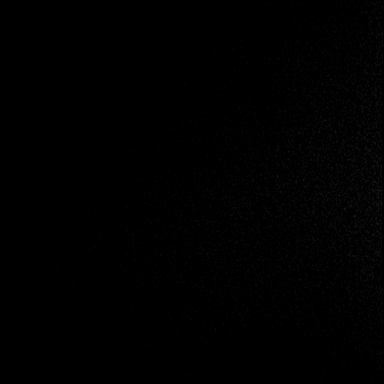

[Series 6: T2 fat-sat · oblique · right · 4.0mm · 0.44mm/px · 6 of 26 slices shown (1 of 2)]
[im 1/26]
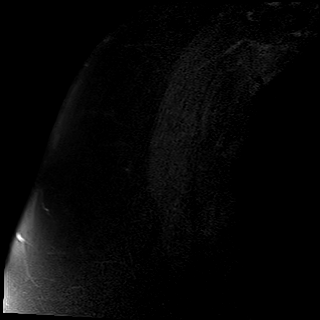
[im 6/26]
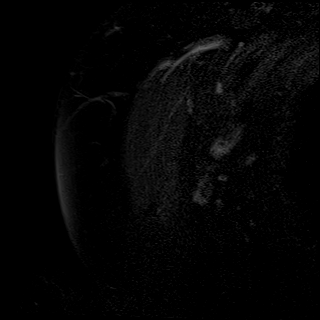
[im 11/26]
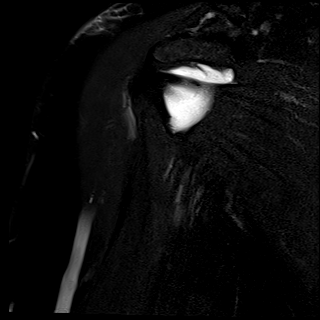
[im 16/26]
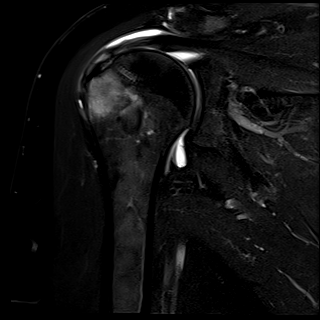
[im 21/26]
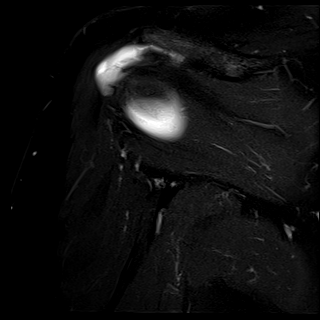
[im 26/26]
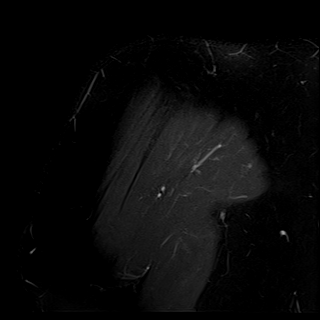

[Series 7: T1 fat-sat · axial · right · 4.0mm · 0.47mm/px · z∈[-88,+31]mm · 6 of 25 slices shown (1 of 3)]
[im 1/25]
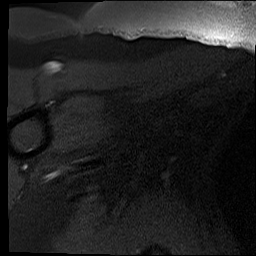
[im 5/25]
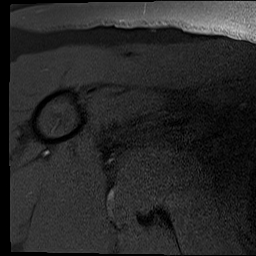
[im 10/25]
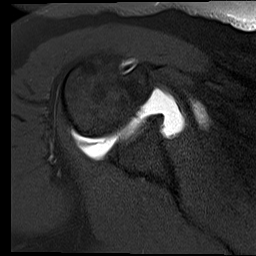
[im 15/25]
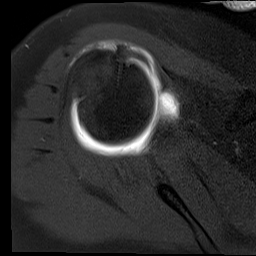
[im 20/25]
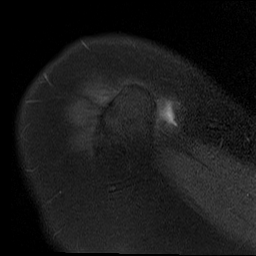
[im 25/25]
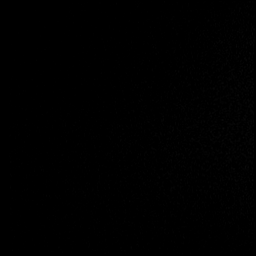

[Series 8: T1 fat-sat · oblique · right · 4.0mm · 0.36mm/px · 6 of 26 slices shown (2 of 3)]
[im 1/26]
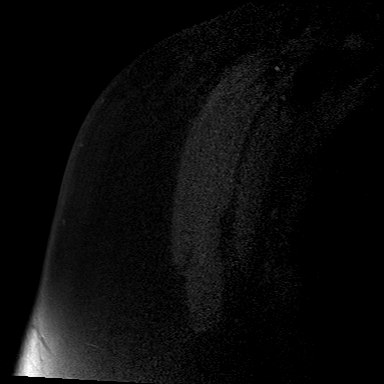
[im 6/26]
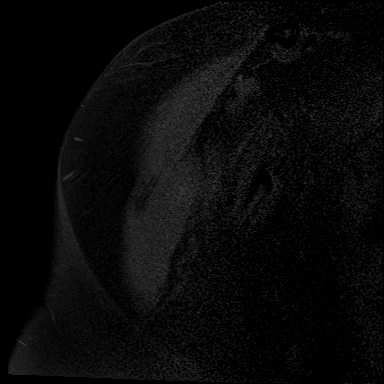
[im 11/26]
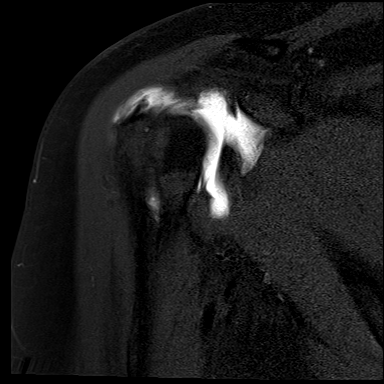
[im 16/26]
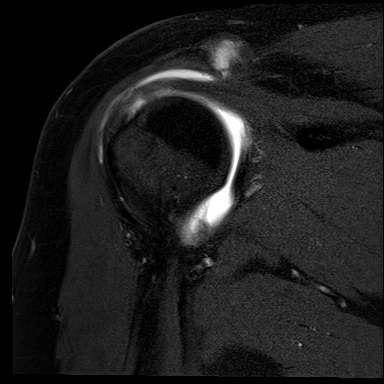
[im 21/26]
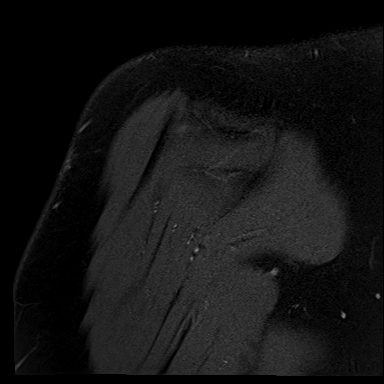
[im 26/26]
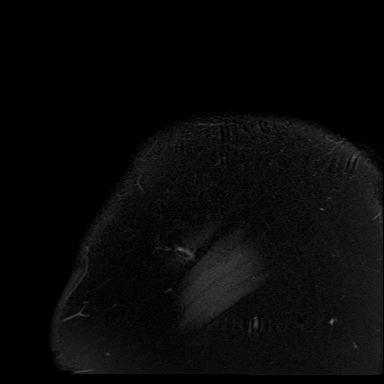

[Series 9: T1 · oblique · right · 4.0mm · 0.36mm/px · 6 of 26 slices shown]
[im 1/26]
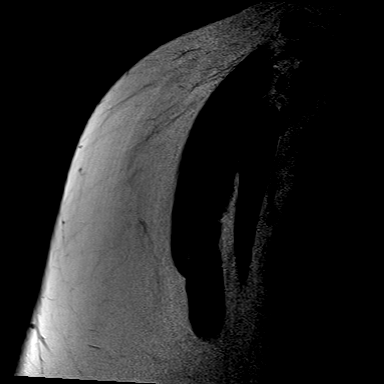
[im 6/26]
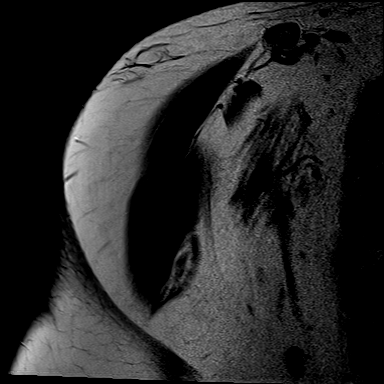
[im 11/26]
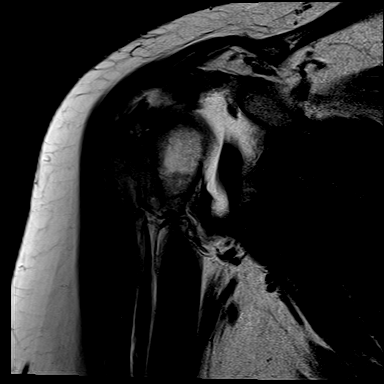
[im 16/26]
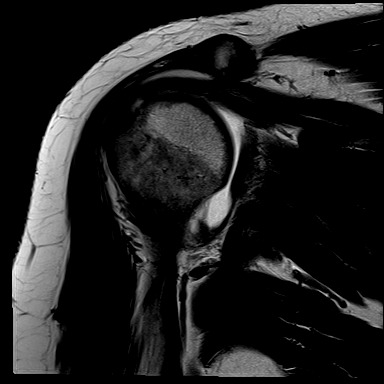
[im 21/26]
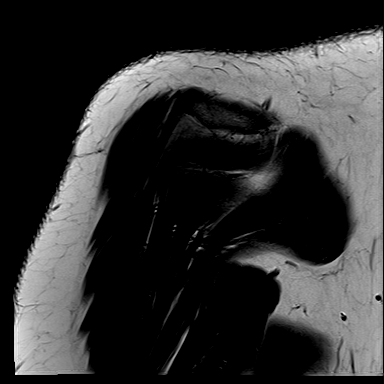
[im 26/26]
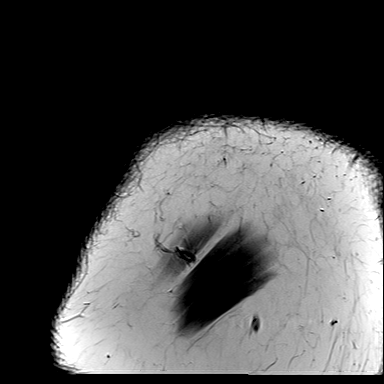

[Series 11: T2 fat-sat · coronal · right · 4.0mm · 0.44mm/px · 6 of 26 slices shown (2 of 2)]
[im 1/26]
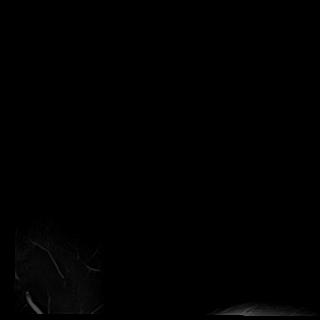
[im 6/26]
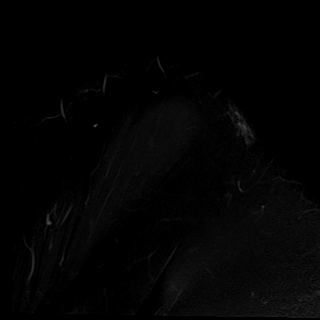
[im 11/26]
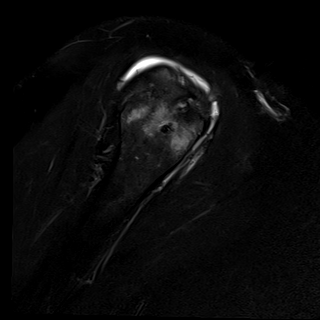
[im 16/26]
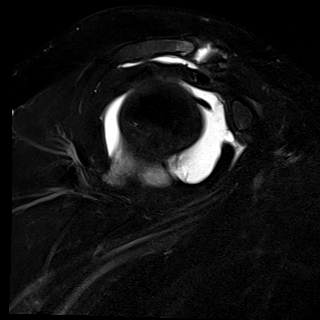
[im 21/26]
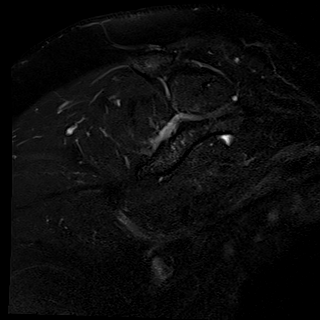
[im 26/26]
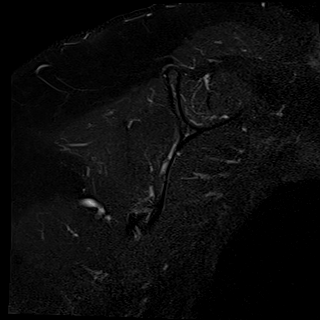

[Series 14: T1 fat-sat · sagittal · right · 4.0mm · 0.37mm/px · 3 of 19 slices shown (3 of 3)]
[im 1/19]
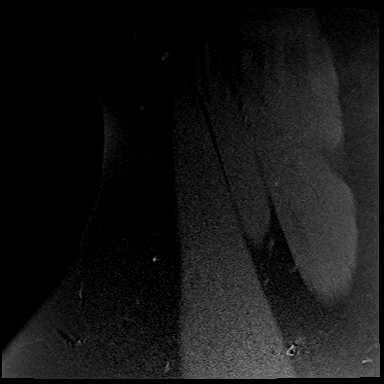
[im 7/19]
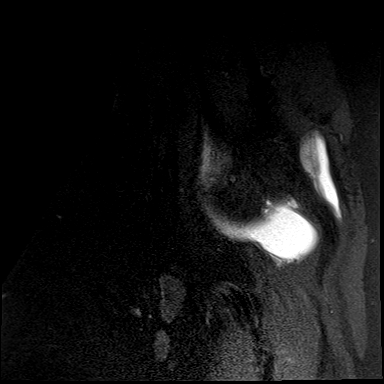
[im 13/19]
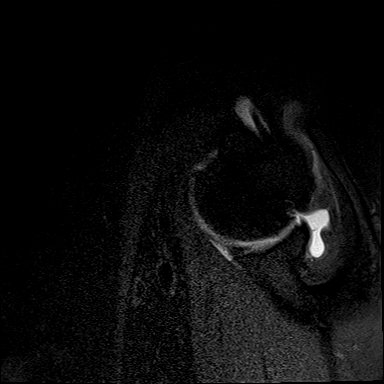

[39 of 40 positions shown; findings below may reference images not displayed]

FINDINGS: Rotator cuff: Prior supraspinatus tendon repair. There is a new
small full-thickness tear involving the far anterior tendon just
proximal to the insertion. Moderate infraspinatus tendinosis. New
high-grade partial-thickness articular surface tear of the conjoined
tendon fibers involving the posterior supraspinatus and anterior
infraspinatus tendons with full-thickness perforation. The teres
minor and subscapularis tendons are intact.

Muscles:  No muscle edema or atrophy.

Biceps long head:  Intact and normally positioned.

Acromioclavicular Joint: The acromion is type I. Prior
acromioplasty. There are mild acromioclavicular degenerative
changes. Contrast in the subacromial/subdeltoid bursa.

Glenohumeral Joint: Distended with intra-articular contrast. No
chondral defect.

Labrum:  No evidence of labral tear.

Bones: Cystic change and edema in the greater tuberosity, likely
reactive. No acute fracture or dislocation. No suspicious bone
lesion.

Other: None.
IMPRESSION: 1. Prior supraspinatus tendon repair with new small full-thickness
tear of the far anterior tendon fibers just proximal to the
insertion.
2. New high-grade partial-thickness articular surface tear of the
conjoined posterior supraspinatus and anterior infraspinatus tendon
fibers with full-thickness perforation.

## 2020-11-23 MED FILL — PREMARIN 0.625 MG TABLET: 0.625 | 90 days supply | Qty: 90 | Fill #3

## 2020-11-27 ENCOUNTER — Other Ambulatory Visit (HOSPITAL_COMMUNITY): Payer: Self-pay | Admitting: Internal Medicine

## 2020-11-27 MED FILL — LISINOPRIL 5 MG TABLET: 5 | 90 days supply | Qty: 90 | Fill #0

## 2020-12-08 ENCOUNTER — Other Ambulatory Visit (HOSPITAL_BASED_OUTPATIENT_CLINIC_OR_DEPARTMENT_OTHER): Payer: Self-pay

## 2021-01-21 ENCOUNTER — Other Ambulatory Visit (HOSPITAL_COMMUNITY): Payer: Self-pay

## 2021-01-21 DIAGNOSIS — E559 Vitamin D deficiency, unspecified: Secondary | ICD-10-CM | POA: Diagnosis not present

## 2021-01-21 DIAGNOSIS — Z23 Encounter for immunization: Secondary | ICD-10-CM | POA: Diagnosis not present

## 2021-01-21 DIAGNOSIS — G4709 Other insomnia: Secondary | ICD-10-CM | POA: Diagnosis not present

## 2021-01-21 DIAGNOSIS — Z0000 Encounter for general adult medical examination without abnormal findings: Secondary | ICD-10-CM | POA: Diagnosis not present

## 2021-01-21 DIAGNOSIS — E6609 Other obesity due to excess calories: Secondary | ICD-10-CM | POA: Diagnosis not present

## 2021-01-21 DIAGNOSIS — G2581 Restless legs syndrome: Secondary | ICD-10-CM | POA: Diagnosis not present

## 2021-01-21 DIAGNOSIS — G4700 Insomnia, unspecified: Secondary | ICD-10-CM | POA: Diagnosis not present

## 2021-01-21 DIAGNOSIS — Z6832 Body mass index (BMI) 32.0-32.9, adult: Secondary | ICD-10-CM | POA: Diagnosis not present

## 2021-01-21 DIAGNOSIS — I10 Essential (primary) hypertension: Secondary | ICD-10-CM | POA: Diagnosis not present

## 2021-01-21 MED ORDER — HYDROCHLOROTHIAZIDE 25 MG PO TABS
1.0000 | ORAL_TABLET | Freq: Every day | ORAL | 1 refills | Status: DC
  Filled 2021-01-21: qty 90, 90d supply, fill #0
  Filled 2021-04-27: qty 90, 90d supply, fill #1

## 2021-02-01 DIAGNOSIS — Z13220 Encounter for screening for lipoid disorders: Secondary | ICD-10-CM | POA: Diagnosis not present

## 2021-02-01 DIAGNOSIS — I10 Essential (primary) hypertension: Secondary | ICD-10-CM | POA: Diagnosis not present

## 2021-02-01 DIAGNOSIS — E559 Vitamin D deficiency, unspecified: Secondary | ICD-10-CM | POA: Diagnosis not present

## 2021-02-06 ENCOUNTER — Other Ambulatory Visit (HOSPITAL_COMMUNITY): Payer: Self-pay

## 2021-02-08 ENCOUNTER — Other Ambulatory Visit (HOSPITAL_COMMUNITY): Payer: Self-pay

## 2021-02-09 ENCOUNTER — Other Ambulatory Visit (HOSPITAL_COMMUNITY): Payer: Self-pay

## 2021-02-09 MED ORDER — LISINOPRIL 5 MG PO TABS
5.0000 mg | ORAL_TABLET | Freq: Every day | ORAL | 0 refills | Status: DC
  Filled 2021-02-09: qty 90, 90d supply, fill #0

## 2021-02-22 ENCOUNTER — Other Ambulatory Visit (HOSPITAL_COMMUNITY): Payer: Self-pay

## 2021-02-22 MED FILL — Estrogens, Conjugated Tab 0.625 MG: ORAL | 90 days supply | Qty: 90 | Fill #0 | Status: AC

## 2021-02-23 ENCOUNTER — Other Ambulatory Visit (HOSPITAL_COMMUNITY): Payer: Self-pay

## 2021-03-24 DIAGNOSIS — Z01419 Encounter for gynecological examination (general) (routine) without abnormal findings: Secondary | ICD-10-CM | POA: Diagnosis not present

## 2021-03-24 DIAGNOSIS — Z6833 Body mass index (BMI) 33.0-33.9, adult: Secondary | ICD-10-CM | POA: Diagnosis not present

## 2021-03-24 DIAGNOSIS — Z1231 Encounter for screening mammogram for malignant neoplasm of breast: Secondary | ICD-10-CM | POA: Diagnosis not present

## 2021-04-27 ENCOUNTER — Other Ambulatory Visit (HOSPITAL_COMMUNITY): Payer: Self-pay

## 2021-04-27 MED ORDER — LISINOPRIL 5 MG PO TABS
5.0000 mg | ORAL_TABLET | Freq: Every day | ORAL | 0 refills | Status: DC
  Filled 2021-04-27: qty 90, 90d supply, fill #0

## 2021-04-27 MED FILL — Estradiol Vaginal Cream 0.1 MG/GM: VAGINAL | 90 days supply | Qty: 42.5 | Fill #0 | Status: AC

## 2021-04-30 ENCOUNTER — Other Ambulatory Visit (HOSPITAL_COMMUNITY): Payer: Self-pay

## 2021-05-22 ENCOUNTER — Other Ambulatory Visit (HOSPITAL_COMMUNITY): Payer: Self-pay

## 2021-05-25 ENCOUNTER — Other Ambulatory Visit (HOSPITAL_COMMUNITY): Payer: Self-pay

## 2021-05-25 MED ORDER — PREMARIN 0.625 MG PO TABS
ORAL_TABLET | ORAL | 3 refills | Status: DC
  Filled 2021-05-25: qty 90, 90d supply, fill #0
  Filled 2021-08-20: qty 90, 90d supply, fill #1
  Filled 2021-11-17: qty 90, 90d supply, fill #2
  Filled 2022-02-19: qty 90, 90d supply, fill #3

## 2021-07-15 DIAGNOSIS — Z1329 Encounter for screening for other suspected endocrine disorder: Secondary | ICD-10-CM | POA: Diagnosis not present

## 2021-07-15 DIAGNOSIS — R7309 Other abnormal glucose: Secondary | ICD-10-CM | POA: Diagnosis not present

## 2021-07-15 DIAGNOSIS — E559 Vitamin D deficiency, unspecified: Secondary | ICD-10-CM | POA: Diagnosis not present

## 2021-07-15 DIAGNOSIS — Z13220 Encounter for screening for lipoid disorders: Secondary | ICD-10-CM | POA: Diagnosis not present

## 2021-07-15 DIAGNOSIS — I10 Essential (primary) hypertension: Secondary | ICD-10-CM | POA: Diagnosis not present

## 2021-07-26 ENCOUNTER — Other Ambulatory Visit (HOSPITAL_COMMUNITY): Payer: Self-pay

## 2021-07-26 DIAGNOSIS — E559 Vitamin D deficiency, unspecified: Secondary | ICD-10-CM | POA: Diagnosis not present

## 2021-07-26 DIAGNOSIS — Z1211 Encounter for screening for malignant neoplasm of colon: Secondary | ICD-10-CM | POA: Diagnosis not present

## 2021-07-26 DIAGNOSIS — Z23 Encounter for immunization: Secondary | ICD-10-CM | POA: Diagnosis not present

## 2021-07-26 DIAGNOSIS — Z1212 Encounter for screening for malignant neoplasm of rectum: Secondary | ICD-10-CM | POA: Diagnosis not present

## 2021-07-26 DIAGNOSIS — Z1329 Encounter for screening for other suspected endocrine disorder: Secondary | ICD-10-CM | POA: Diagnosis not present

## 2021-07-26 DIAGNOSIS — Z13220 Encounter for screening for lipoid disorders: Secondary | ICD-10-CM | POA: Diagnosis not present

## 2021-07-26 DIAGNOSIS — I10 Essential (primary) hypertension: Secondary | ICD-10-CM | POA: Diagnosis not present

## 2021-07-26 DIAGNOSIS — R7309 Other abnormal glucose: Secondary | ICD-10-CM | POA: Diagnosis not present

## 2021-07-26 MED ORDER — HYDROCHLOROTHIAZIDE 25 MG PO TABS
25.0000 mg | ORAL_TABLET | Freq: Every day | ORAL | 1 refills | Status: DC
  Filled 2021-07-26 – 2021-07-28 (×2): qty 90, 90d supply, fill #0
  Filled 2021-10-30: qty 90, 90d supply, fill #1

## 2021-07-26 MED ORDER — LISINOPRIL 5 MG PO TABS
5.0000 mg | ORAL_TABLET | Freq: Every day | ORAL | 1 refills | Status: DC
  Filled 2021-07-26 – 2021-07-28 (×2): qty 90, 90d supply, fill #0
  Filled 2021-10-30: qty 90, 90d supply, fill #1

## 2021-07-28 ENCOUNTER — Other Ambulatory Visit (HOSPITAL_COMMUNITY): Payer: Self-pay

## 2021-08-20 ENCOUNTER — Other Ambulatory Visit (HOSPITAL_COMMUNITY): Payer: Self-pay

## 2021-08-31 ENCOUNTER — Other Ambulatory Visit (HOSPITAL_COMMUNITY): Payer: Self-pay

## 2021-08-31 DIAGNOSIS — H524 Presbyopia: Secondary | ICD-10-CM | POA: Diagnosis not present

## 2021-08-31 DIAGNOSIS — H52223 Regular astigmatism, bilateral: Secondary | ICD-10-CM | POA: Diagnosis not present

## 2021-08-31 MED ORDER — BIMATOPROST 0.03 % EX SOLN
CUTANEOUS | 3 refills | Status: DC
  Filled 2021-09-01: qty 3, 30d supply, fill #0
  Filled 2021-10-30: qty 3, 30d supply, fill #1
  Filled 2022-01-12 – 2022-01-13 (×2): qty 3, 30d supply, fill #2
  Filled 2022-04-06: qty 3, 30d supply, fill #3

## 2021-09-01 ENCOUNTER — Other Ambulatory Visit (HOSPITAL_COMMUNITY): Payer: Self-pay

## 2021-09-02 ENCOUNTER — Other Ambulatory Visit (HOSPITAL_COMMUNITY): Payer: Self-pay

## 2021-09-24 ENCOUNTER — Other Ambulatory Visit (HOSPITAL_COMMUNITY): Payer: Self-pay

## 2021-09-24 MED ORDER — TRETINOIN 0.025 % EX CREA
TOPICAL_CREAM | CUTANEOUS | 0 refills | Status: DC
  Filled 2021-09-24 – 2021-09-29 (×2): qty 45, 30d supply, fill #0

## 2021-09-29 ENCOUNTER — Other Ambulatory Visit (HOSPITAL_COMMUNITY): Payer: Self-pay

## 2021-09-30 ENCOUNTER — Other Ambulatory Visit (HOSPITAL_COMMUNITY): Payer: Self-pay

## 2021-10-30 ENCOUNTER — Other Ambulatory Visit (HOSPITAL_COMMUNITY): Payer: Self-pay

## 2021-11-02 ENCOUNTER — Other Ambulatory Visit (HOSPITAL_COMMUNITY): Payer: Self-pay

## 2021-11-17 ENCOUNTER — Other Ambulatory Visit (HOSPITAL_COMMUNITY): Payer: Self-pay

## 2021-11-18 ENCOUNTER — Other Ambulatory Visit (HOSPITAL_COMMUNITY): Payer: Self-pay

## 2021-11-18 MED ORDER — ESTRADIOL 0.1 MG/GM VA CREA
TOPICAL_CREAM | VAGINAL | 0 refills | Status: DC
  Filled 2021-11-18: qty 42.5, 90d supply, fill #0

## 2021-11-23 DIAGNOSIS — I10 Essential (primary) hypertension: Secondary | ICD-10-CM | POA: Diagnosis not present

## 2021-11-23 DIAGNOSIS — Z13220 Encounter for screening for lipoid disorders: Secondary | ICD-10-CM | POA: Diagnosis not present

## 2021-11-23 DIAGNOSIS — R7309 Other abnormal glucose: Secondary | ICD-10-CM | POA: Diagnosis not present

## 2021-11-23 DIAGNOSIS — E663 Overweight: Secondary | ICD-10-CM | POA: Diagnosis not present

## 2021-11-23 DIAGNOSIS — E559 Vitamin D deficiency, unspecified: Secondary | ICD-10-CM | POA: Diagnosis not present

## 2022-01-12 ENCOUNTER — Other Ambulatory Visit (HOSPITAL_COMMUNITY): Payer: Self-pay

## 2022-01-12 MED ORDER — ENOXAPARIN SODIUM 40 MG/0.4ML IJ SOSY
PREFILLED_SYRINGE | INTRAMUSCULAR | 0 refills | Status: DC
  Filled 2022-01-12: qty 4.8, 12d supply, fill #0

## 2022-01-12 MED ORDER — TRAMADOL HCL 50 MG PO TABS
ORAL_TABLET | ORAL | 0 refills | Status: DC
  Filled 2022-01-12: qty 20, 5d supply, fill #0

## 2022-01-12 MED ORDER — METHOCARBAMOL 500 MG PO TABS
ORAL_TABLET | ORAL | 0 refills | Status: DC
  Filled 2022-01-12: qty 30, 7d supply, fill #0

## 2022-01-13 ENCOUNTER — Other Ambulatory Visit (HOSPITAL_COMMUNITY): Payer: Self-pay

## 2022-01-13 MED ORDER — TRETINOIN 0.025 % EX CREA
TOPICAL_CREAM | CUTANEOUS | 0 refills | Status: DC
  Filled 2022-01-13: qty 45, 30d supply, fill #0

## 2022-01-14 ENCOUNTER — Other Ambulatory Visit (HOSPITAL_COMMUNITY): Payer: Self-pay

## 2022-01-17 ENCOUNTER — Other Ambulatory Visit (HOSPITAL_COMMUNITY): Payer: Self-pay

## 2022-01-20 ENCOUNTER — Other Ambulatory Visit (HOSPITAL_COMMUNITY): Payer: Self-pay

## 2022-01-29 ENCOUNTER — Other Ambulatory Visit (HOSPITAL_COMMUNITY): Payer: Self-pay

## 2022-01-31 ENCOUNTER — Other Ambulatory Visit (HOSPITAL_COMMUNITY): Payer: Self-pay

## 2022-01-31 MED ORDER — HYDROCHLOROTHIAZIDE 25 MG PO TABS
25.0000 mg | ORAL_TABLET | Freq: Every day | ORAL | 1 refills | Status: DC
  Filled 2022-01-31: qty 90, 90d supply, fill #0
  Filled 2022-04-26: qty 90, 90d supply, fill #1

## 2022-01-31 MED ORDER — LISINOPRIL 5 MG PO TABS
5.0000 mg | ORAL_TABLET | Freq: Every day | ORAL | 1 refills | Status: DC
  Filled 2022-01-31: qty 90, 90d supply, fill #0
  Filled 2022-04-26: qty 90, 90d supply, fill #1

## 2022-02-16 ENCOUNTER — Other Ambulatory Visit (HOSPITAL_COMMUNITY): Payer: Self-pay

## 2022-02-17 ENCOUNTER — Other Ambulatory Visit (HOSPITAL_COMMUNITY): Payer: Self-pay

## 2022-02-19 ENCOUNTER — Other Ambulatory Visit (HOSPITAL_COMMUNITY): Payer: Self-pay

## 2022-02-22 ENCOUNTER — Other Ambulatory Visit (HOSPITAL_COMMUNITY): Payer: Self-pay

## 2022-02-22 MED ORDER — CYCLOSPORINE 0.05 % OP EMUL
OPHTHALMIC | 3 refills | Status: DC
  Filled 2022-02-22: qty 180, 90d supply, fill #0
  Filled 2022-07-01: qty 180, 90d supply, fill #1
  Filled 2023-02-21: qty 180, 90d supply, fill #2

## 2022-02-23 ENCOUNTER — Other Ambulatory Visit (HOSPITAL_COMMUNITY): Payer: Self-pay

## 2022-04-05 ENCOUNTER — Other Ambulatory Visit (HOSPITAL_COMMUNITY): Payer: Self-pay

## 2022-04-05 DIAGNOSIS — Z1231 Encounter for screening mammogram for malignant neoplasm of breast: Secondary | ICD-10-CM | POA: Diagnosis not present

## 2022-04-05 DIAGNOSIS — Z6828 Body mass index (BMI) 28.0-28.9, adult: Secondary | ICD-10-CM | POA: Diagnosis not present

## 2022-04-05 DIAGNOSIS — K5792 Diverticulitis of intestine, part unspecified, without perforation or abscess without bleeding: Secondary | ICD-10-CM | POA: Insufficient documentation

## 2022-04-05 DIAGNOSIS — Z124 Encounter for screening for malignant neoplasm of cervix: Secondary | ICD-10-CM | POA: Diagnosis not present

## 2022-04-05 DIAGNOSIS — Z01419 Encounter for gynecological examination (general) (routine) without abnormal findings: Secondary | ICD-10-CM | POA: Diagnosis not present

## 2022-04-05 DIAGNOSIS — G43909 Migraine, unspecified, not intractable, without status migrainosus: Secondary | ICD-10-CM | POA: Insufficient documentation

## 2022-04-05 DIAGNOSIS — Z1151 Encounter for screening for human papillomavirus (HPV): Secondary | ICD-10-CM | POA: Diagnosis not present

## 2022-04-05 MED ORDER — ESTRADIOL 0.1 MG/GM VA CREA
TOPICAL_CREAM | VAGINAL | 3 refills | Status: DC
  Filled 2022-04-05: qty 42.5, 90d supply, fill #0
  Filled 2022-07-01: qty 42.5, 90d supply, fill #1
  Filled 2023-03-30: qty 42.5, 90d supply, fill #2

## 2022-04-05 MED ORDER — PREMARIN 0.625 MG PO TABS
0.6250 mg | ORAL_TABLET | Freq: Every day | ORAL | 3 refills | Status: DC
  Filled 2022-04-05 – 2022-04-26 (×2): qty 90, 90d supply, fill #0
  Filled 2022-08-01: qty 90, 90d supply, fill #1
  Filled 2022-10-28: qty 90, 90d supply, fill #2
  Filled 2023-01-26: qty 90, 90d supply, fill #3

## 2022-04-06 ENCOUNTER — Other Ambulatory Visit (HOSPITAL_COMMUNITY): Payer: Self-pay

## 2022-04-07 ENCOUNTER — Other Ambulatory Visit (HOSPITAL_COMMUNITY): Payer: Self-pay

## 2022-04-13 ENCOUNTER — Other Ambulatory Visit (HOSPITAL_COMMUNITY): Payer: Self-pay

## 2022-04-13 MED ORDER — DIAZEPAM 10 MG PO TABS
ORAL_TABLET | ORAL | 0 refills | Status: DC
  Filled 2022-04-13: qty 2, 2d supply, fill #0

## 2022-04-13 MED ORDER — DOXYCYCLINE HYCLATE 100 MG PO CAPS
ORAL_CAPSULE | ORAL | 0 refills | Status: DC
  Filled 2022-04-13: qty 14, 7d supply, fill #0

## 2022-04-13 MED ORDER — OXYCODONE-ACETAMINOPHEN 5-325 MG PO TABS
ORAL_TABLET | ORAL | 0 refills | Status: DC
  Filled 2022-04-13: qty 30, 3d supply, fill #0

## 2022-04-13 MED ORDER — PROMETHAZINE HCL 12.5 MG PO TABS
ORAL_TABLET | ORAL | 0 refills | Status: DC
Start: 2022-04-13 — End: 2022-05-26
  Filled 2022-04-13: qty 15, 3d supply, fill #0

## 2022-04-13 MED ORDER — SCOPOLAMINE 1 MG/3DAYS TD PT72
MEDICATED_PATCH | TRANSDERMAL | 0 refills | Status: DC
  Filled 2022-04-13: qty 1, 3d supply, fill #0

## 2022-04-26 ENCOUNTER — Other Ambulatory Visit (HOSPITAL_COMMUNITY): Payer: Self-pay

## 2022-04-28 ENCOUNTER — Other Ambulatory Visit (HOSPITAL_COMMUNITY): Payer: Self-pay

## 2022-05-03 ENCOUNTER — Other Ambulatory Visit (HOSPITAL_COMMUNITY): Payer: Self-pay

## 2022-05-06 ENCOUNTER — Other Ambulatory Visit (HOSPITAL_COMMUNITY): Payer: Self-pay

## 2022-05-09 ENCOUNTER — Other Ambulatory Visit (HOSPITAL_COMMUNITY): Payer: Self-pay

## 2022-05-10 ENCOUNTER — Other Ambulatory Visit (HOSPITAL_COMMUNITY): Payer: Self-pay

## 2022-05-26 ENCOUNTER — Ambulatory Visit (HOSPITAL_BASED_OUTPATIENT_CLINIC_OR_DEPARTMENT_OTHER): Payer: 59 | Admitting: Nurse Practitioner

## 2022-05-26 ENCOUNTER — Encounter (HOSPITAL_BASED_OUTPATIENT_CLINIC_OR_DEPARTMENT_OTHER): Payer: Self-pay | Admitting: Nurse Practitioner

## 2022-05-26 VITALS — BP 141/64 | HR 82 | Ht 65.0 in | Wt 173.0 lb

## 2022-05-26 DIAGNOSIS — I10 Essential (primary) hypertension: Secondary | ICD-10-CM | POA: Diagnosis not present

## 2022-05-26 DIAGNOSIS — Z0000 Encounter for general adult medical examination without abnormal findings: Secondary | ICD-10-CM

## 2022-05-26 DIAGNOSIS — D692 Other nonthrombocytopenic purpura: Secondary | ICD-10-CM

## 2022-05-26 DIAGNOSIS — Z1211 Encounter for screening for malignant neoplasm of colon: Secondary | ICD-10-CM | POA: Diagnosis not present

## 2022-05-26 NOTE — Progress Notes (Signed)
Monica Render, DNP, AGNP-c [facility] [address]  [place],  [address] [phone]  New patient visit   Patient: Monica Petty   DOB: 1966-06-18   55 y.o. Female  MRN: [id number] Visit Date: 05/26/2022  Patient Care Team: Monica Petty, Monica Pesa, NP as PCP - General (Nurse Practitioner)  Today's Vitals   05/26/22 0445 05/26/22 1612  BP: 126/71 (!) 141/64  Pulse:  82  SpO2:  99%  Weight:  173 lb (78.5 kg)  Height:  _0  (1.651 m)   Body mass index is 28.79 kg/m.   Today's healthcare provider: Orma Render, NP   Chief Complaint  Patient presents with   New Patient (Initial Visit)    Patient presents today to establish care.    Subjective    Monica Petty is a 56 y.o. female who presents today as a new patient to establish care.    Patient endorses the following concerns presently:  Dr. Lynnette Petty- GYN July 18 Pap (normal) Mammogram July 18  Hypertension Monica Petty endorses chronic hypertension with current good control. She is currently managed with HCTZ and lisinopril. No HA, ShoB, CP, palpitations, HA, LE edema.  She has been working on intermittent fasting and speed walking 45 minutes a day.   Dark Spots She endorses easy bruising with dark spots that occur with very little pressure.   History reviewed and reveals the following: Past Medical History:  Diagnosis Date   Complication of anesthesia    sob after 05-23-2019 sx for 1-2 days after got home   Diverticulosis of colon    GERD (gastroesophageal reflux disease)    hx of   History of colon polyps    History of concussion    1987  fell of horse--- no residual   Hypertension    Migraine    occasional   Rosacea    Rotator cuff tear    recurrent right side   Past Surgical History:  Procedure Laterality Date   COLONOSCOPY  06/06/2016   LAPAROSCOPIC CHOLECYSTECTOMY  06-03-2008   dr Ulyess Blossom  [facility]   REDUCTION MAMMAPLASTY Bilateral 03-05-2002   dr  barber  [facility]   SHOULDER ARTHROSCOPY WITH ROTATOR CUFF REPAIR Right 05/23/2019   Procedure: SHOULDER ARTHROSCOPY WITH revision rotator cuff repair and  debirdement;  Surgeon: Sydnee Cabal, MD;  Location: [facility];  Service: Orthopedics;  Laterality: Right;  interscalene block   SHOULDER ARTHROSCOPY WITH ROTATOR CUFF REPAIR Right 12/05/2019   Procedure: SHOULDER ARTHROSCOPY debridement revision rotator cuff repair, , with bone marrow aspiration from humerus;  Surgeon: Sydnee Cabal, MD;  Location: [facility];  Service: Orthopedics;  Laterality: Right;  exparel interscalene block   SHOULDER ARTHROSCOPY WITH ROTATOR CUFF REPAIR AND SUBACROMIAL DECOMPRESSION Right 04/17/2018   Procedure: Right shoulder evaluation under anesthesia, scope, debridement, subacromial decompression, rotator cuff repair;  Surgeon: Sydnee Cabal, MD;  Location[facility];  Service: Orthopedics;  Laterality: Right;  120 mins General with Intra Scalene Block   VAGINAL HYSTERECTOMY  02-12-2002   dr Nori Riis  [facility]   WISDOM TOOTH EXTRACTION  teen   Family Status  Relation Name Status   Mother  (Not Specified)   Monica Petty  (Not Specified)   Other pggm Alive   Neg Hx  (Not Specified)   Family History  Problem Relation Age of Onset   Colon polyps Mother    Breast cancer Paternal Aunt    Breast cancer  Other    Colon cancer Neg Hx    Social History   Socioeconomic History   Marital status: Married    Spouse name: Not on file   Number of children: Not on file   Years of education: Not on file   Highest education level: Not on file  Occupational History   Not on file  Tobacco Use   Smoking status: Never   Smokeless tobacco: Never  Vaping Use   Vaping Use: Never used  Substance and Sexual Activity   Alcohol use: Not Currently    Alcohol/week: 7.0 standard drinks of alcohol    Types: 7 Glasses of wine per week   Drug use: No   Sexual activity: Not on file  Other Topics  Concern   Not on file  Social History Narrative   Not on file   Social Determinants of Health   Financial Resource Strain: Not on file  Food Insecurity: Not on file  Transportation Needs: Not on file  Physical Activity: Not on file  Stress: Not on file  Social Connections: Not on file   Outpatient Medications Prior to Visit  Medication Sig   cycloSPORINE (RESTASIS) 0.05 % ophthalmic emulsion Instill 1 drop in each eye twice daily as directed   estradiol (ESTRACE) 0.1 MG/GM vaginal cream INSERT 1 GRAM VAGINALLY ONCE A WEEK   estrogens, conjugated, (PREMARIN) 0.625 MG tablet Take 1 tablet (0.625 mg total) by mouth daily.   hydrochlorothiazide (HYDRODIURIL) 25 MG tablet TAKE 1 TABLET BY MOUTH ONCE DAILY   Ibuprofen (ADVIL PO) Take 200 mg by mouth as needed. Takes 600 to 800 mg prn   lisinopril (ZESTRIL) 5 MG tablet Take 1 tablet  by mouth daily.   methocarbamol (ROBAXIN) 500 MG tablet Take 1 tablet (500 mg total) by mouth 4 times daily.   Multiple Vitamins-Minerals (MULTIVITAMIN ADULT PO) Take 1 capsule by mouth daily.    tretinoin (RETIN-A) 0.025 % cream Apply to affected area once a day as needed for rash breakout.   [DISCONTINUED] bimatoprost (LATISSE) 0.03 % ophthalmic solution Apply a small amount in both eyes daily as directed   [DISCONTINUED] cycloSPORINE (RESTASIS) 0.05 % ophthalmic emulsion Place 1 drop into both eyes 2 (two) times daily.   [DISCONTINUED] estradiol (ESTRACE VAGINAL) 0.1 MG/GM vaginal cream Insert 1 gram vaginally every week   [DISCONTINUED] estrogens, conjugated, (PREMARIN) 0.625 MG tablet Take 0.625 mg by mouth daily.    [DISCONTINUED] lisinopril (PRINIVIL,ZESTRIL) 5 MG tablet Take 5 mg by mouth every morning.    [DISCONTINUED] methocarbamol (ROBAXIN) 500 MG tablet Take 1 tablet (500 mg total) by mouth every 8 (eight) hours as needed for muscle spasms.   [DISCONTINUED] methocarbamol (ROBAXIN) 500 MG tablet Take 1 tablet (500 mg total) by mouth 4 (four) times  daily.   [DISCONTINUED] tretinoin (RETIN-A) 0.025 % gel Apply topically daily.    [DISCONTINUED] ALPRAZolam (XANAX) 0.5 MG tablet Take 0.5 mg by mouth at bedtime.    [DISCONTINUED] diazepam (VALIUM) 10 MG tablet Take 1 tablet by mouth the night prior to surgery. Take 1 tablet the morning of surgery with a small sip of water.   [DISCONTINUED] doxycycline (VIBRAMYCIN) 100 MG capsule Take 1 capsule by mouth twice a day as directed beginning the night prior to procedure until finished.   [DISCONTINUED] enoxaparin (LOVENOX) 40 MG/0.4ML injection Inject 0.4 mLs (40 mg total) into the skin daily for 12 days. Administer alternate thigh daily   [DISCONTINUED] estrogens, conjugated, (PREMARIN) 0.625 MG tablet TAKE 1 TABLET BY  MOUTH ONCE DAILY   [DISCONTINUED] hydrochlorothiazide (HYDRODIURIL) 25 MG tablet Take 25 mg by mouth every morning.    [DISCONTINUED] hydrochlorothiazide (HYDRODIURIL) 25 MG tablet TAKE 1 TABLET BY MOUTH ONCE DAILY   [DISCONTINUED] lisinopril (ZESTRIL) 5 MG tablet TAKE 1 TABLET BY MOUTH ONCE DAILY ** PATIENT NEEDS OFFICE VISIT FOR FURTHER REFILLS**   [DISCONTINUED] oxyCODONE-acetaminophen (PERCOCET) 5-325 MG tablet Take 1-2 tablets by mouth every four to six hours as needed for pain   [DISCONTINUED] promethazine (PHENERGAN) 12.5 MG tablet Take 1 tablet by mouth every four hours as needed for nausea   [DISCONTINUED] rOPINIRole (REQUIP) 0.25 MG tablet TAKE 1 TABLET BY MOUTH AT BEDTIME.   [DISCONTINUED] rOPINIRole (REQUIP) 0.25 MG tablet TAKE 1 TABLET BY MOUTH AT BEDTIME.   [DISCONTINUED] scopolamine (TRANSDERM-SCOP) 1 MG/3DAYS Apply 1 patch to skin directly behind right ear the night prior to surgery. Leave on for 72hrs.   No facility-administered medications prior to visit.   Allergies  Allergen Reactions   Adhesive [Tape]     Rips skin, likes paper tape   Codeine Other (See Comments)    tinnitis and photophobia    Sulfa Antibiotics Other (See Comments)    tinnitis and  photophobia   Penicillins Rash   Immunization History  Administered Date(s) Administered   Influenza,inj,Quad PF,6-35 Mos 05/30/2017   Influenza-Unspecified 05/21/2015, 05/30/2016, 07/08/2019   PFIZER(Purple Top)SARS-COV-2 Vaccination 09/09/2019, 09/27/2019   Tdap 07/24/2020   Zoster Recombinat (Shingrix) 07/24/2020, 01/21/2021    Review of Systems All review of systems negative except what is listed in the HPI   Objective    BP (!) 141/64   Pulse 82   Ht $R'5\' 5"'aP$  (1.651 m)   Wt 173 lb (78.5 kg)   SpO2 99%   BMI 28.79 kg/m  Physical Exam Vitals and nursing note reviewed.  Constitutional:      General: She is not in acute distress.    Appearance: Normal appearance.  Eyes:     Extraocular Movements: Extraocular movements intact.     Conjunctiva/sclera: Conjunctivae normal.     Pupils: Pupils are equal, round, and reactive to light.  Neck:     Vascular: No carotid bruit.  Cardiovascular:     Rate and Rhythm: Normal rate and regular rhythm.     Pulses: Normal pulses.     Heart sounds: Normal heart sounds. No murmur heard. Pulmonary:     Effort: Pulmonary effort is normal.     Breath sounds: Normal breath sounds. No wheezing.  Abdominal:     General: Bowel sounds are normal.     Palpations: Abdomen is soft.  Musculoskeletal:        General: Normal range of motion.     Cervical back: Normal range of motion.     Right lower leg: No edema.     Left lower leg: No edema.  Skin:    General: Skin is warm and dry.     Capillary Refill: Capillary refill takes less than 2 seconds.     Findings: Bruising present.  Neurological:     General: No focal deficit present.     Mental Status: She is alert and oriented to person, place, and time.  Psychiatric:        Mood and Affect: Mood normal.        Behavior: Behavior normal.        Thought Content: Thought content normal.        Judgment: Judgment normal.     Results for orders placed  or performed in visit on 05/26/22  CBC  With Diff/Platelet  Result Value Ref Range   WBC 7.9 3.4 - 10.8 x10E3/uL   RBC 4.77 3.77 - 5.28 x10E6/uL   Hemoglobin 14.6 11.1 - 15.9 g/dL   Hematocrit 42.7 34.0 - 46.6 %   MCV 90 79 - 97 fL   MCH 30.6 26.6 - 33.0 pg   MCHC 34.2 31.5 - 35.7 g/dL   RDW 12.2 11.7 - 15.4 %   Platelets 305 150 - 450 x10E3/uL   Neutrophils 51 Not Estab. %   Lymphs 35 Not Estab. %   Monocytes 10 Not Estab. %   Eos 3 Not Estab. %   Basos 1 Not Estab. %   Neutrophils Absolute 4.1 1.4 - 7.0 x10E3/uL   Lymphocytes Absolute 2.7 0.7 - 3.1 x10E3/uL   Monocytes Absolute 0.8 0.1 - 0.9 x10E3/uL   EOS (ABSOLUTE) 0.2 0.0 - 0.4 x10E3/uL   Basophils Absolute 0.1 0.0 - 0.2 x10E3/uL   Immature Granulocytes 0 Not Estab. %   Immature Grans (Abs) 0.0 0.0 - 0.1 x10E3/uL  Comprehensive metabolic panel  Result Value Ref Range   Glucose 98 70 - 99 mg/dL   BUN 23 6 - 24 mg/dL   Creatinine, Ser 0.82 0.57 - 1.00 mg/dL   eGFR 84 >59 mL/min/1.73   BUN/Creatinine Ratio 28 (H) 9 - 23   Sodium 137 134 - 144 mmol/L   Potassium 4.3 3.5 - 5.2 mmol/L   Chloride 99 96 - 106 mmol/L   CO2 23 20 - 29 mmol/L   Calcium 9.8 8.7 - 10.2 mg/dL   Total Protein 6.6 6.0 - 8.5 g/dL   Albumin 4.4 3.8 - 4.9 g/dL   Globulin, Total 2.2 1.5 - 4.5 g/dL   Albumin/Globulin Ratio 2.0 1.2 - 2.2   Bilirubin Total 0.3 0.0 - 1.2 mg/dL   Alkaline Phosphatase 61 44 - 121 IU/L   AST 12 0 - 40 IU/L   ALT 12 0 - 32 IU/L  Hemoglobin A1c  Result Value Ref Range   Hgb A1c MFr Bld 5.5 4.8 - 5.6 %   Est. average glucose Bld gHb Est-mCnc 111 mg/dL  VITAMIN D 25 Hydroxy (Vit-D Deficiency, Fractures)  Result Value Ref Range   Vit D, 25-Hydroxy 62.0 30.0 - 100.0 ng/mL  Thyroid Panel With TSH  Result Value Ref Range   TSH 1.310 0.450 - 4.500 uIU/mL   T4, Total 9.0 4.5 - 12.0 ug/dL   T3 Uptake Ratio 28 24 - 39 %   Free Thyroxine Index 2.5 1.2 - 4.9  Lipid panel  Result Value Ref Range   Cholesterol, Total 204 (H) 100 - 199 mg/dL   Triglycerides 102 0 - 149  mg/dL   HDL 92 >39 mg/dL   VLDL Cholesterol Cal 18 5 - 40 mg/dL   LDL Chol Calc (NIH) 94 0 - 99 mg/dL   Chol/HDL Ratio 2.2 0.0 - 4.4 ratio    Assessment & Plan      Problem List Items Addressed This Visit     Essential hypertension    Chronic. Initially elevated, but recheck shows well controlled. Recommend continue current medication. Will plan for labs at next visit.       Relevant Orders   CBC With Diff/Platelet (Completed)   Comprehensive metabolic panel (Completed)   Hemoglobin A1c (Completed)   VITAMIN D 25 Hydroxy (Vit-D Deficiency, Fractures) (Completed)   Thyroid Panel With TSH (Completed)   Lipid panel (Completed)   Senile purpura (  [place])    Bruising consistent with senile purpura on the upper extremities with no signs of excessive or easy bleeding. Recommendations for arm protection and pressure when there is a bump or injury. Will monitor.       Other Visit Diagnoses     Encounter for medical examination to establish care    -  Primary   Relevant Orders   CBC With Diff/Platelet (Completed)   Comprehensive metabolic panel (Completed)   Hemoglobin A1c (Completed)   VITAMIN D 25 Hydroxy (Vit-D Deficiency, Fractures) (Completed)   Thyroid Panel With TSH (Completed)   Lipid panel (Completed)   Screening for colon cancer       Relevant Orders   Ambulatory referral to Gastroenterology        No follow-ups on file.      Matix Henshaw, Monica Pesa, NP, DNP, AGNP-C [facility] at [place] Maintenance Due Health Maintenance Topics with due status: Overdue     Topic Date Due   HIV Screening Never done   Hepatitis C Screening Never done   PAP SMEAR-Modifier Never done   MAMMOGRAM 02/02/2016   COVID-19 Vaccine 11/22/2019   COLONOSCOPY (Pts 45-65yr Insurance coverage will need to be confirmed) 06/06/2021    CPE Due  Labs Due

## 2022-05-27 DIAGNOSIS — Z0000 Encounter for general adult medical examination without abnormal findings: Secondary | ICD-10-CM | POA: Diagnosis not present

## 2022-05-27 DIAGNOSIS — I10 Essential (primary) hypertension: Secondary | ICD-10-CM | POA: Diagnosis not present

## 2022-05-28 LAB — CBC WITH DIFF/PLATELET
Basophils Absolute: 0.1 10*3/uL (ref 0.0–0.2)
Basos: 1 %
EOS (ABSOLUTE): 0.2 10*3/uL (ref 0.0–0.4)
Eos: 3 %
Hematocrit: 42.7 % (ref 34.0–46.6)
Hemoglobin: 14.6 g/dL (ref 11.1–15.9)
Immature Grans (Abs): 0 10*3/uL (ref 0.0–0.1)
Immature Granulocytes: 0 %
Lymphocytes Absolute: 2.7 10*3/uL (ref 0.7–3.1)
Lymphs: 35 %
MCH: 30.6 pg (ref 26.6–33.0)
MCHC: 34.2 g/dL (ref 31.5–35.7)
MCV: 90 fL (ref 79–97)
Monocytes Absolute: 0.8 10*3/uL (ref 0.1–0.9)
Monocytes: 10 %
Neutrophils Absolute: 4.1 10*3/uL (ref 1.4–7.0)
Neutrophils: 51 %
Platelets: 305 10*3/uL (ref 150–450)
RBC: 4.77 x10E6/uL (ref 3.77–5.28)
RDW: 12.2 % (ref 11.7–15.4)
WBC: 7.9 10*3/uL (ref 3.4–10.8)

## 2022-05-28 LAB — COMPREHENSIVE METABOLIC PANEL
ALT: 12 IU/L (ref 0–32)
AST: 12 IU/L (ref 0–40)
Albumin/Globulin Ratio: 2 (ref 1.2–2.2)
Albumin: 4.4 g/dL (ref 3.8–4.9)
Alkaline Phosphatase: 61 IU/L (ref 44–121)
BUN/Creatinine Ratio: 28 — ABNORMAL HIGH (ref 9–23)
BUN: 23 mg/dL (ref 6–24)
Bilirubin Total: 0.3 mg/dL (ref 0.0–1.2)
CO2: 23 mmol/L (ref 20–29)
Calcium: 9.8 mg/dL (ref 8.7–10.2)
Chloride: 99 mmol/L (ref 96–106)
Creatinine, Ser: 0.82 mg/dL (ref 0.57–1.00)
Globulin, Total: 2.2 g/dL (ref 1.5–4.5)
Glucose: 98 mg/dL (ref 70–99)
Potassium: 4.3 mmol/L (ref 3.5–5.2)
Sodium: 137 mmol/L (ref 134–144)
Total Protein: 6.6 g/dL (ref 6.0–8.5)
eGFR: 84 mL/min/{1.73_m2} (ref >59)

## 2022-05-28 LAB — LIPID PANEL
Chol/HDL Ratio: 2.2 ratio (ref 0.0–4.4)
Cholesterol, Total: 204 mg/dL — ABNORMAL HIGH (ref 100–199)
HDL: 92 mg/dL (ref >39)
LDL Chol Calc (NIH): 94 mg/dL (ref 0–99)
Triglycerides: 102 mg/dL (ref 0–149)
VLDL Cholesterol Cal: 18 mg/dL (ref 5–40)

## 2022-05-28 LAB — THYROID PANEL WITH TSH
Free Thyroxine Index: 2.5 (ref 1.2–4.9)
T3 Uptake Ratio: 28 % (ref 24–39)
T4, Total: 9 ug/dL (ref 4.5–12.0)
TSH: 1.31 u[IU]/mL (ref 0.450–4.500)

## 2022-05-28 LAB — HEMOGLOBIN A1C
Est. average glucose Bld gHb Est-mCnc: 111 mg/dL
Hgb A1c MFr Bld: 5.5 % (ref 4.8–5.6)

## 2022-05-28 LAB — VITAMIN D 25 HYDROXY (VIT D DEFICIENCY, FRACTURES): Vit D, 25-Hydroxy: 62 ng/mL (ref 30.0–100.0)

## 2022-05-31 ENCOUNTER — Other Ambulatory Visit (HOSPITAL_COMMUNITY): Payer: Self-pay

## 2022-06-18 ENCOUNTER — Encounter (HOSPITAL_BASED_OUTPATIENT_CLINIC_OR_DEPARTMENT_OTHER): Payer: Self-pay | Admitting: Nurse Practitioner

## 2022-06-18 DIAGNOSIS — D692 Other nonthrombocytopenic purpura: Secondary | ICD-10-CM | POA: Insufficient documentation

## 2022-06-18 NOTE — Assessment & Plan Note (Signed)
Bruising consistent with senile purpura on the upper extremities with no signs of excessive or easy bleeding. Recommendations for arm protection and pressure when there is a bump or injury. Will monitor.

## 2022-06-18 NOTE — Assessment & Plan Note (Signed)
Chronic. Initially elevated, but recheck shows well controlled. Recommend continue current medication. Will plan for labs at next visit.

## 2022-06-20 ENCOUNTER — Other Ambulatory Visit (HOSPITAL_COMMUNITY): Payer: Self-pay

## 2022-07-01 ENCOUNTER — Other Ambulatory Visit (HOSPITAL_COMMUNITY): Payer: Self-pay

## 2022-07-05 ENCOUNTER — Other Ambulatory Visit (HOSPITAL_COMMUNITY): Payer: Self-pay

## 2022-07-05 MED ORDER — TRETINOIN 0.025 % EX CREA
TOPICAL_CREAM | CUTANEOUS | 0 refills | Status: DC
  Filled 2022-07-05: qty 45, 30d supply, fill #0

## 2022-07-26 ENCOUNTER — Other Ambulatory Visit (HOSPITAL_COMMUNITY): Payer: Self-pay

## 2022-08-01 ENCOUNTER — Other Ambulatory Visit (HOSPITAL_COMMUNITY): Payer: Self-pay

## 2022-08-01 MED ORDER — LISINOPRIL 5 MG PO TABS
5.0000 mg | ORAL_TABLET | Freq: Every day | ORAL | 0 refills | Status: DC
  Filled 2022-08-01: qty 30, 30d supply, fill #0

## 2022-08-01 MED ORDER — TRETINOIN 0.025 % EX CREA
TOPICAL_CREAM | CUTANEOUS | 0 refills | Status: DC
  Filled 2022-08-01: qty 45, 30d supply, fill #0

## 2022-08-01 MED ORDER — HYDROCHLOROTHIAZIDE 25 MG PO TABS
25.0000 mg | ORAL_TABLET | Freq: Every day | ORAL | 0 refills | Status: DC
  Filled 2022-08-01: qty 30, 30d supply, fill #0

## 2022-08-02 ENCOUNTER — Other Ambulatory Visit (HOSPITAL_COMMUNITY): Payer: Self-pay

## 2022-08-24 ENCOUNTER — Other Ambulatory Visit: Payer: Self-pay

## 2022-08-24 ENCOUNTER — Other Ambulatory Visit (HOSPITAL_COMMUNITY): Payer: Self-pay

## 2022-08-24 ENCOUNTER — Telehealth: Payer: Self-pay | Admitting: Nurse Practitioner

## 2022-08-24 MED ORDER — HYDROCHLOROTHIAZIDE 25 MG PO TABS
25.0000 mg | ORAL_TABLET | Freq: Every day | ORAL | 2 refills | Status: DC
  Filled 2022-08-24: qty 30, 30d supply, fill #0
  Filled 2022-08-29: qty 90, 90d supply, fill #0

## 2022-08-24 MED ORDER — LISINOPRIL 5 MG PO TABS
5.0000 mg | ORAL_TABLET | Freq: Every day | ORAL | 2 refills | Status: DC
  Filled 2022-08-24: qty 30, 30d supply, fill #0
  Filled 2022-08-29: qty 90, 90d supply, fill #0

## 2022-08-24 NOTE — Telephone Encounter (Signed)
Pt called she needs refills on HCTZ and lisinopril   [employer]

## 2022-08-29 ENCOUNTER — Other Ambulatory Visit (HOSPITAL_COMMUNITY): Payer: Self-pay

## 2022-09-27 ENCOUNTER — Other Ambulatory Visit (HOSPITAL_COMMUNITY): Payer: Self-pay

## 2022-09-27 MED ORDER — BIMATOPROST 0.03 % EX SOLN
CUTANEOUS | 11 refills | Status: DC
  Filled 2022-09-27: qty 3, 30d supply, fill #0
  Filled 2022-12-25: qty 3, 30d supply, fill #1
  Filled 2023-09-18: qty 3, 30d supply, fill #2
  Filled 2023-09-19: qty 5, 30d supply, fill #2

## 2022-09-28 ENCOUNTER — Encounter: Payer: Self-pay | Admitting: Internal Medicine

## 2022-09-28 ENCOUNTER — Other Ambulatory Visit (HOSPITAL_COMMUNITY): Payer: Self-pay

## 2022-09-28 ENCOUNTER — Other Ambulatory Visit: Payer: Self-pay

## 2022-10-21 ENCOUNTER — Other Ambulatory Visit (HOSPITAL_COMMUNITY): Payer: Self-pay

## 2022-10-28 ENCOUNTER — Other Ambulatory Visit (HOSPITAL_BASED_OUTPATIENT_CLINIC_OR_DEPARTMENT_OTHER): Payer: Self-pay

## 2022-10-28 ENCOUNTER — Other Ambulatory Visit: Payer: Self-pay

## 2022-10-28 ENCOUNTER — Other Ambulatory Visit (HOSPITAL_COMMUNITY): Payer: Self-pay

## 2022-11-08 ENCOUNTER — Other Ambulatory Visit (HOSPITAL_COMMUNITY): Payer: Self-pay

## 2022-11-23 ENCOUNTER — Other Ambulatory Visit: Payer: Self-pay | Admitting: Nurse Practitioner

## 2022-11-23 ENCOUNTER — Other Ambulatory Visit: Payer: Self-pay

## 2022-11-23 ENCOUNTER — Other Ambulatory Visit (HOSPITAL_COMMUNITY): Payer: Self-pay

## 2022-11-23 MED ORDER — HYDROCHLOROTHIAZIDE 25 MG PO TABS
25.0000 mg | ORAL_TABLET | Freq: Every day | ORAL | 0 refills | Status: DC
  Filled 2022-11-23: qty 30, 30d supply, fill #0

## 2022-11-23 MED ORDER — LISINOPRIL 5 MG PO TABS
5.0000 mg | ORAL_TABLET | Freq: Every day | ORAL | 0 refills | Status: DC
  Filled 2022-11-23: qty 30, 30d supply, fill #0

## 2022-11-29 DIAGNOSIS — I87393 Chronic venous hypertension (idiopathic) with other complications of bilateral lower extremity: Secondary | ICD-10-CM | POA: Diagnosis not present

## 2022-11-29 DIAGNOSIS — R252 Cramp and spasm: Secondary | ICD-10-CM | POA: Diagnosis not present

## 2022-11-29 DIAGNOSIS — I10 Essential (primary) hypertension: Secondary | ICD-10-CM | POA: Diagnosis not present

## 2022-11-29 DIAGNOSIS — G2581 Restless legs syndrome: Secondary | ICD-10-CM | POA: Diagnosis not present

## 2022-12-15 ENCOUNTER — Ambulatory Visit: Payer: Commercial Managed Care - PPO | Admitting: Nurse Practitioner

## 2022-12-15 ENCOUNTER — Other Ambulatory Visit (HOSPITAL_COMMUNITY): Payer: Self-pay

## 2022-12-15 ENCOUNTER — Encounter: Payer: Self-pay | Admitting: Nurse Practitioner

## 2022-12-15 VITALS — BP 130/82 | HR 94 | Wt 188.6 lb

## 2022-12-15 DIAGNOSIS — G5700 Lesion of sciatic nerve, unspecified lower limb: Secondary | ICD-10-CM | POA: Diagnosis not present

## 2022-12-15 DIAGNOSIS — Z6831 Body mass index (BMI) 31.0-31.9, adult: Secondary | ICD-10-CM | POA: Diagnosis not present

## 2022-12-15 DIAGNOSIS — L719 Rosacea, unspecified: Secondary | ICD-10-CM

## 2022-12-15 DIAGNOSIS — I10 Essential (primary) hypertension: Secondary | ICD-10-CM

## 2022-12-15 MED ORDER — TRETINOIN 0.025 % EX CREA
TOPICAL_CREAM | CUTANEOUS | 3 refills | Status: DC
  Filled 2022-12-15: qty 45, 30d supply, fill #0
  Filled 2023-01-10: qty 45, 30d supply, fill #1
  Filled 2023-02-21: qty 45, 30d supply, fill #2
  Filled 2023-04-26: qty 45, 30d supply, fill #3

## 2022-12-15 MED ORDER — HYDROCHLOROTHIAZIDE 25 MG PO TABS
25.0000 mg | ORAL_TABLET | Freq: Every day | ORAL | 3 refills | Status: DC
  Filled 2022-12-15 – 2022-12-25 (×2): qty 90, 90d supply, fill #0
  Filled 2023-03-30: qty 90, 90d supply, fill #1
  Filled 2023-06-24: qty 90, 90d supply, fill #2
  Filled 2023-09-22: qty 90, 90d supply, fill #3

## 2022-12-15 MED ORDER — LISINOPRIL 5 MG PO TABS
5.0000 mg | ORAL_TABLET | Freq: Every day | ORAL | 3 refills | Status: DC
  Filled 2022-12-15 – 2022-12-25 (×2): qty 90, 90d supply, fill #0
  Filled 2023-03-30: qty 90, 90d supply, fill #1
  Filled 2023-06-24: qty 90, 90d supply, fill #2
  Filled 2023-09-22: qty 90, 90d supply, fill #3

## 2022-12-15 NOTE — Progress Notes (Signed)
Shawna ClampSaraBeth Monica Goodell, DNP, AGNP-c [facility]  [address] [place], [place] [address] [phone]  ESTABLISHED PATIENT- Chronic Health and/or Follow-Up Visit  Blood pressure 130/82, pulse 94, weight 188 lb 9.6 oz (85.5 kg).    Monica Petty is a 57 y.o. year old female presenting today for evaluation and management of the following: Monica Petty presents with concerns about recent weight gain and piriformis muscle pain.   She attributes the weight gain of 14 pounds to [facility] and decreased physical activity. She walks for 45 minutes daily and occasionally performs online stretches. She previously had successful weight loss and improved cholesterol on the [facility]. She wants to resume the weight loss plan and continue exercising despite the ongoing piriformis pain. Her spouse supports her during walks.   She manages the piriformis pain with inconsistent use of ibuprofen 800 milligrams. The piriformis pain is cyclical and relieved by a specific stretch performed four to five times daily. She has access to a TENS unit for pain management. The pain started two and a half months ago after pulling a heavy object.   She has a surgical history of three shoulder surgeries and occasionally takes Robaxin for shoulder discomfort. She mentions challenges in obtaining Tretinoin refills. Current medications include Restasis and Estradiol, managed by her GYN office. Blood pressure is well-controlled. Physical examination scheduled for September.   All ROS negative with exception of what is listed above.   PHYSICAL EXAM Physical Exam Vitals and nursing note reviewed.  Constitutional:      General: She is not in acute distress.    Appearance: Normal appearance. She is obese. She is not ill-appearing.  HENT:     Head: Normocephalic.  Eyes:     Extraocular Movements: Extraocular movements intact.     Conjunctiva/sclera: Conjunctivae  normal.     Pupils: Pupils are equal, round, and reactive to light.  Neck:     Vascular: No carotid bruit.  Cardiovascular:     Rate and Rhythm: Normal rate and regular rhythm.     Pulses: Normal pulses.     Heart sounds: Normal heart sounds. No murmur heard. Pulmonary:     Effort: Pulmonary effort is normal.     Breath sounds: Normal breath sounds. No wheezing.  Abdominal:     General: Bowel sounds are normal. There is no distension.     Palpations: Abdomen is soft.     Tenderness: There is no abdominal tenderness. There is no guarding.  Musculoskeletal:        General: Tenderness present. Normal range of motion.     Cervical back: Normal range of motion and neck supple.       Back:     Right lower leg: No edema.     Left lower leg: No edema.  Lymphadenopathy:     Cervical: No cervical adenopathy.  Skin:    General: Skin is warm and dry.     Capillary Refill: Capillary refill takes less than 2 seconds.  Neurological:     General: No focal deficit present.     Mental Status: She is alert and oriented to person, place, and time.  Psychiatric:        Mood and Affect: Mood normal.        Behavior: Behavior normal.        Thought Content: Thought content normal.        Judgment: Judgment normal.     PLAN Problem List Items Addressed  This Visit     Essential hypertension - Primary    - Hypertension Management: Blood pressure readings indicate effective control with Lisinopril and Hydrochlorothiazide.  Plan: Maintain current prescription and process refills as required.      Relevant Medications   lisinopril (ZESTRIL) 5 MG tablet   hydrochlorothiazide (HYDRODIURIL) 25 MG tablet   Rosacea    Rosacea Treatment: Patient reports managing rosacea and receiving treatment for small vessels on nose.  Plan: Continue with treatment plan and encourage follow-up visits with dermatologist.      Relevant Medications   tretinoin (RETIN-A) 0.025 % cream   BMI 31.0-31.9,adult    -  Weight Management: Patient gained 14 pounds since last visit and discontinued [place] Weight Loss plan.  Plan: Resume [place] Weight Loss plan and suggest physical activity considering piriformis syndrome.      Piriformis syndrome    - Piriformis Syndrome: Patient reports pain in piriformis muscle for 2.5 months.  Plan: Advise consistent use of ibuprofen 800 mg as needed, recommend piriformis stretches, and propose TENS unit for pain relief.       Return for August for CPE.   Shawna ClampSaraBeth Barbee Mamula, DNP, AGNP-c 12/15/2022  4:24 PM

## 2022-12-15 NOTE — Patient Instructions (Signed)
WEIGHT LOSS PLANNING Your progress today shows:     12/15/2022    4:21 PM 05/26/2022    4:12 PM 05/26/2022    4:45 AM  Vitals with BMI  Height  5\' 5"    Weight 188 lbs 10 oz 173 lbs   BMI  [id number]   Systolic [id number]  Diastolic 82 64 71  Pulse 94 82     For best management of weight, it is vital to balance intake versus output. This means the number of calories burned per day must be less than the calories you take in with food and drink.   I recommend trying to follow a diet with the following: Calories: 1200-1500 calories per day Carbohydrates: 150-180 grams of carbohydrates per day  Why: Gives your body enough "quick fuel" for cells to maintain normal function without sending them into starvation mode.  Protein: At least 90 grams of protein per day- 30 grams with each meal Why: Protein takes longer and uses more energy than carbohydrates to break down for fuel. The carbohydrates in your meals serves as quick energy sources and proteins help use some of that extra quick energy to break down to produce long term energy. This helps you not feel hungry as quickly and protein breakdown burns calories.  Water: Drink AT LEAST 64 ounces of water per day  Why: Water is essential to healthy metabolism. Water helps to fill the stomach and keep you fuller longer. Water is required for healthy digestion and filtering of waste in the body.  Fat: Limit fats in your diet- when choosing fats, choose foods with lower fats content such as lean meats (chicken, fish, [place]).  Why: Increased fat intake leads to storage "for later". Once you burn your carbohydrate energy, your body goes into fat and protein breakdown mode to help you loose weight.  Cholesterol: Fats and oils that are LIQUID at room temperature are best. Choose vegetable oils (olive oil, avocado oil, nuts). Avoid fats that are SOLID at room temperature (animal fats, processed meats). Healthy fats are often found in whole grains, beans, nuts,  seeds, and berries.  Why: Elevated cholesterol levels lead to build up of cholesterol on the inside of your blood vessels. This will eventually cause the blood vessels to become hard and can lead to high blood pressure and damage to your organs. When the blood flow is reduced, but the pressure is high from cholesterol buildup, parts of the cholesterol can break off and form clots that can go to the brain or heart leading to a stroke or heart attack.  Fiber: Increase amount of SOLUBLE the fiber in your diet. This helps to fill you up, lowers cholesterol, and helps with digestion. Some foods high in soluble fiber are oats, peas, beans, apples, carrots, barley, and citrus fruits.   Why: Fiber fills you up, helps remove excess cholesterol, and aids in healthy digestion which are all very important in weight management.   I recommend the following as a minimum activity routine: Purposeful walk or other physical activity at least 20 minutes every single day. This means purposefully taking a walk, jog, bike, swim, treadmill, elliptical, dance, etc.  This activity should be ABOVE your normal daily activities, such as walking at work. Goal exercise should be at least 150 minutes a week- work your way up to this.   Heart Rate: Your maximum exercise heart rate should be 220 - Your Age in Years. When exercising, get your heart rate up, but avoid  going over the maximum targeted heart rate.  60-70% of your maximum heart rate is where you tend to burn the most fat. To find this number:  220 - Age In Years= Max HR  Max HR x 0.6 (or 0.7) = Fat Burning HR The Fat Burning HR is your goal heart rate while working out to burn the most fat.  NEVER exercise to the point your feel lightheaded, weak, nauseated, dizzy. If you experience ANY of these symptoms- STOP exercise! Allow yourself to cool down and your heart rate to come down. Then restart slower next time.  If at [place] you feel chest pain or chest pressure  during exercise, STOP IMMEDIATELY and seek medical attention.

## 2022-12-16 ENCOUNTER — Other Ambulatory Visit: Payer: Self-pay

## 2022-12-16 ENCOUNTER — Other Ambulatory Visit (HOSPITAL_COMMUNITY): Payer: Self-pay

## 2022-12-26 ENCOUNTER — Other Ambulatory Visit: Payer: Self-pay

## 2022-12-26 ENCOUNTER — Other Ambulatory Visit (HOSPITAL_COMMUNITY): Payer: Self-pay

## 2022-12-26 DIAGNOSIS — Z6831 Body mass index (BMI) 31.0-31.9, adult: Secondary | ICD-10-CM | POA: Insufficient documentation

## 2022-12-26 DIAGNOSIS — G5700 Lesion of sciatic nerve, unspecified lower limb: Secondary | ICD-10-CM | POA: Insufficient documentation

## 2022-12-26 DIAGNOSIS — Z6830 Body mass index (BMI) 30.0-30.9, adult: Secondary | ICD-10-CM | POA: Insufficient documentation

## 2022-12-26 NOTE — Assessment & Plan Note (Signed)
Rosacea Treatment: Patient reports managing rosacea and receiving treatment for small vessels on nose.  Plan: Continue with treatment plan and encourage follow-up visits with dermatologist.

## 2022-12-26 NOTE — Assessment & Plan Note (Signed)
-   Weight Management: Patient gained 14 pounds since last visit and discontinued [place] Weight Loss plan.  Plan: Resume [place] Weight Loss plan and suggest physical activity considering piriformis syndrome.

## 2022-12-26 NOTE — Assessment & Plan Note (Signed)
-   Piriformis Syndrome: Patient reports pain in piriformis muscle for 2.5 months.  Plan: Advise consistent use of ibuprofen 800 mg as needed, recommend piriformis stretches, and propose TENS unit for pain relief.

## 2022-12-26 NOTE — Assessment & Plan Note (Signed)
-   Hypertension Management: Blood pressure readings indicate effective control with Lisinopril and Hydrochlorothiazide.  Plan: Maintain current prescription and process refills as required.

## 2022-12-27 ENCOUNTER — Other Ambulatory Visit: Payer: Self-pay

## 2022-12-27 ENCOUNTER — Other Ambulatory Visit (HOSPITAL_COMMUNITY): Payer: Self-pay

## 2023-01-03 DIAGNOSIS — L570 Actinic keratosis: Secondary | ICD-10-CM | POA: Diagnosis not present

## 2023-01-03 DIAGNOSIS — I781 Nevus, non-neoplastic: Secondary | ICD-10-CM | POA: Diagnosis not present

## 2023-01-03 DIAGNOSIS — D2330 Other benign neoplasm of skin of unspecified part of face: Secondary | ICD-10-CM | POA: Diagnosis not present

## 2023-01-03 DIAGNOSIS — D692 Other nonthrombocytopenic purpura: Secondary | ICD-10-CM | POA: Diagnosis not present

## 2023-01-10 ENCOUNTER — Other Ambulatory Visit: Payer: Self-pay

## 2023-01-26 ENCOUNTER — Other Ambulatory Visit: Payer: Self-pay

## 2023-01-27 ENCOUNTER — Other Ambulatory Visit: Payer: Self-pay

## 2023-02-21 ENCOUNTER — Other Ambulatory Visit (HOSPITAL_COMMUNITY): Payer: Self-pay

## 2023-03-30 ENCOUNTER — Other Ambulatory Visit (HOSPITAL_COMMUNITY): Payer: Self-pay

## 2023-04-21 ENCOUNTER — Other Ambulatory Visit: Payer: Self-pay | Admitting: Oncology

## 2023-04-21 DIAGNOSIS — Z006 Encounter for examination for normal comparison and control in clinical research program: Secondary | ICD-10-CM

## 2023-04-25 ENCOUNTER — Encounter: Payer: Self-pay | Admitting: Nurse Practitioner

## 2023-04-25 ENCOUNTER — Ambulatory Visit (INDEPENDENT_AMBULATORY_CARE_PROVIDER_SITE_OTHER): Payer: Commercial Managed Care - PPO | Admitting: Nurse Practitioner

## 2023-04-25 VITALS — BP 140/90 | HR 88 | Ht 65.75 in | Wt 194.0 lb

## 2023-04-25 DIAGNOSIS — Z6831 Body mass index (BMI) 31.0-31.9, adult: Secondary | ICD-10-CM

## 2023-04-25 DIAGNOSIS — L719 Rosacea, unspecified: Secondary | ICD-10-CM | POA: Diagnosis not present

## 2023-04-25 DIAGNOSIS — Z0000 Encounter for general adult medical examination without abnormal findings: Secondary | ICD-10-CM | POA: Diagnosis not present

## 2023-04-25 DIAGNOSIS — G5702 Lesion of sciatic nerve, left lower limb: Secondary | ICD-10-CM | POA: Diagnosis not present

## 2023-04-25 DIAGNOSIS — I10 Essential (primary) hypertension: Secondary | ICD-10-CM

## 2023-04-25 DIAGNOSIS — D692 Other nonthrombocytopenic purpura: Secondary | ICD-10-CM | POA: Diagnosis not present

## 2023-04-25 LAB — CBC WITH DIFFERENTIAL/PLATELET
Basophils Absolute: 0.1 10*3/uL (ref 0.0–0.2)
Basos: 1 %
EOS (ABSOLUTE): 0.1 10*3/uL (ref 0.0–0.4)
Eos: 3 %
Hematocrit: 43.2 % (ref 34.0–46.6)
Hemoglobin: 14.7 g/dL (ref 11.1–15.9)
Immature Grans (Abs): 0 10*3/uL (ref 0.0–0.1)
Immature Granulocytes: 0 %
Lymphocytes Absolute: 1.7 10*3/uL (ref 0.7–3.1)
Lymphs: 32 %
MCH: 30.6 pg (ref 26.6–33.0)
MCHC: 34 g/dL (ref 31.5–35.7)
MCV: 90 fL (ref 79–97)
Monocytes Absolute: 0.4 10*3/uL (ref 0.1–0.9)
Monocytes: 7 %
Neutrophils Absolute: 3.1 10*3/uL (ref 1.4–7.0)
Neutrophils: 57 %
Platelets: 300 10*3/uL (ref 150–450)
RBC: 4.81 x10E6/uL (ref 3.77–5.28)
RDW: 12.5 % (ref 11.7–15.4)
WBC: 5.4 10*3/uL (ref 3.4–10.8)

## 2023-04-25 LAB — CMP14+EGFR
ALT: 15 IU/L (ref 0–32)
AST: 14 IU/L (ref 0–40)
Albumin: 4.2 g/dL (ref 3.8–4.9)
Alkaline Phosphatase: 62 IU/L (ref 44–121)
BUN/Creatinine Ratio: 21 (ref 9–23)
BUN: 15 mg/dL (ref 6–24)
Bilirubin Total: 0.3 mg/dL (ref 0.0–1.2)
CO2: 25 mmol/L (ref 20–29)
Calcium: 9.4 mg/dL (ref 8.7–10.2)
Chloride: 102 mmol/L (ref 96–106)
Creatinine, Ser: 0.72 mg/dL (ref 0.57–1.00)
Globulin, Total: 2.5 g/dL (ref 1.5–4.5)
Glucose: 88 mg/dL (ref 70–99)
Potassium: 4.1 mmol/L (ref 3.5–5.2)
Sodium: 140 mmol/L (ref 134–144)
Total Protein: 6.7 g/dL (ref 6.0–8.5)
eGFR: 97 mL/min/{1.73_m2} (ref >59)

## 2023-04-25 LAB — LIPID PANEL
Chol/HDL Ratio: 1.9 ratio (ref 0.0–4.4)
Cholesterol, Total: 183 mg/dL (ref 100–199)
HDL: 94 mg/dL (ref >39)
LDL Chol Calc (NIH): 74 mg/dL (ref 0–99)
Triglycerides: 84 mg/dL (ref 0–149)
VLDL Cholesterol Cal: 15 mg/dL (ref 5–40)

## 2023-04-25 LAB — HEMOGLOBIN A1C
Est. average glucose Bld gHb Est-mCnc: 111 mg/dL
Hgb A1c MFr Bld: 5.5 % (ref 4.8–5.6)

## 2023-04-25 LAB — HEPATITIS C ANTIBODY

## 2023-04-25 LAB — HIV ANTIBODY (ROUTINE TESTING W REFLEX)

## 2023-04-25 LAB — VITAMIN D 25 HYDROXY (VIT D DEFICIENCY, FRACTURES)

## 2023-04-25 NOTE — Progress Notes (Signed)
Shawna Clamp, DNP, AGNP-c [facility] [address] [place], [place] [address] Main Office [phone]  BP (!) 140/90   Pulse 88   Ht 5' 5.75" (1.67 m)   Wt 194 lb (88 kg)   BMI 31.55 kg/m    Subjective:    Patient ID: Monica Petty, female    DOB: 1966-03-26, 57 y.o.   MRN: [id number]  HPI: Monica Petty is a 57 y.o. female presenting on 04/25/2023 for comprehensive medical examination.  Send her the meal tracker information for apps.   Current medical concerns include: She reports she is disappointed about her weight, but she is planning to restart with [facility] in the fall.    Pertinent items are noted in HPI.  IMMUNIZATIONS:   Flu: Flu vaccine postponed until flu season Prevnar 13: Prevnar 13 N/A for this patient Prevnar 20: Prevnar 20 N/A for this patient Pneumovax 23: Pneumovax 23 N/A for this patient Vac Shingrix: Shingrix completed, documentation in chart (Dose # 2/2) HPV: HPV N/A for this patient Tetanus: Tetanus completed in the last 10 years COVID: COVID completed, documentation in chart   HEALTH MAINTENANCE: Pap Smear HM Status: scheduled with Dr. Langston Masker later this month Mammogram HM Status: scheduled with Dr. Langston Masker later this month Colon Cancer Screening HM Status: is up to date Bone Density HM Status: is not applicable for this patient STI Testing HM Status: is not applicable for this patient Lung CT HM Status: is not applicable for this patient  She denies concerns with vision. She denies concerns with dentition.   She is working on her diet to help reduce portion sizes.  She endorses exercise and/or activity of: Active 30 + min 4+ x/wk Mode: walking   Most Recent Depression Screen:     04/25/2023    8:24 AM 05/26/2022    4:52 PM  Depression screen PHQ 2/9  Decreased Interest 0 0  Down, Depressed, Hopeless 0 0  PHQ - 2 Score 0 0   Most Recent Anxiety Screen:      No data to display         Most  Recent Fall Screen:    04/25/2023    8:24 AM 05/26/2022    4:50 PM  Fall Risk   Falls in the past year? 0 0  Number falls in past yr: 0 0  Injury with Fall? 0 0  Risk for fall due to : No Fall Risks No Fall Risks  Follow up Falls evaluation completed Education provided    Past medical history, surgical history, medications, allergies, family history and social history reviewed with patient today and changes made to appropriate areas of the chart.  Past Medical History:  Past Medical History:  Diagnosis Date  . Allergy   . Complication of anesthesia    sob after 05-23-2019 sx for 1-2 days after got home  . Diverticulosis of colon   . GERD (gastroesophageal reflux disease)    hx of  . History of colon polyps   . History of concussion    1987  fell of horse--- no residual  . Hypertension   . Migraine    occasional  . Rosacea   . Rotator cuff tear    recurrent right side   Medications:  Current Outpatient Medications on File Prior to Visit  Medication Sig  . cycloSPORINE (RESTASIS) 0.05 % ophthalmic emulsion Instill 1 drop in each eye twice daily as directed  . estradiol (ESTRACE) 0.1 MG/GM vaginal cream INSERT  1 GRAM VAGINALLY ONCE A WEEK  . hydrochlorothiazide (HYDRODIURIL) 25 MG tablet Take 1 tablet (25 mg total) by mouth daily.  . Ibuprofen (ADVIL PO) Take 200 mg by mouth as needed. Takes 600 to 800 mg prn  . lisinopril (ZESTRIL) 5 MG tablet Take 1 tablet (5 mg total) by mouth daily.  . Multiple Vitamins-Minerals (MULTIVITAMIN ADULT PO) Take 1 capsule by mouth daily.   Marland Kitchen tretinoin (RETIN-A) 0.025 % cream Apply to affected area once a day as needed for rash breakout.  . bimatoprost (LATISSE) 0.03 % ophthalmic solution Apply 1 drop via applicator once a day as directed (Patient not taking: Reported on 04/25/2023)   No current facility-administered medications on file prior to visit.   Surgical History:  Past Surgical History:  Procedure Laterality Date  . COLONOSCOPY   06/06/2016  . COSMETIC SURGERY  Abdominoplasty 5/23  . LAPAROSCOPIC CHOLECYSTECTOMY  06-03-2008   dr Aleen Campi  [facility]  . REDUCTION MAMMAPLASTY Bilateral 03-05-2002   dr barber  [facility]  . SHOULDER ARTHROSCOPY WITH ROTATOR CUFF REPAIR Right 05/23/2019   Procedure: SHOULDER ARTHROSCOPY WITH revision rotator cuff repair and  debirdement;  Surgeon: Eugenia Mcalpine, MD;  Location: [facility];  Service: Orthopedics;  Laterality: Right;  interscalene block  . SHOULDER ARTHROSCOPY WITH ROTATOR CUFF REPAIR Right 12/05/2019   Procedure: SHOULDER ARTHROSCOPY debridement revision rotator cuff repair, , with bone marrow aspiration from humerus;  Surgeon: Eugenia Mcalpine, MD;  Location: [facility];  Service: Orthopedics;  Laterality: Right;  exparel interscalene block  . SHOULDER ARTHROSCOPY WITH ROTATOR CUFF REPAIR AND SUBACROMIAL DECOMPRESSION Right 04/17/2018   Procedure: Right shoulder evaluation under anesthesia, scope, debridement, subacromial decompression, rotator cuff repair;  Surgeon: Eugenia Mcalpine, MD;  Location: [facility];  Service: Orthopedics;  Laterality: Right;  120 mins General with Intra Scalene Block  . VAGINAL HYSTERECTOMY  02-12-2002   dr Jennette Kettle  [facility]  . WISDOM TOOTH EXTRACTION  [age]   Allergies:  Allergies  Allergen Reactions  . Adhesive [Tape]     Rips skin, likes paper tape  . Codeine Other (See Comments)    tinnitis and photophobia   . Sulfa Antibiotics Other (See Comments)    tinnitis and photophobia  . Penicillins Rash   Family History:  Family History  Problem Relation Age of Onset  . Colon polyps Mother   . Hypertension Mother   . Breast cancer Paternal Aunt   . Breast cancer Other   . Cancer Father   . Hypertension Father   . Diabetes Brother   . Colon cancer Neg Hx        Objective:    BP (!) 140/90   Pulse 88   Ht 5' 5.75" (1.67 m)   Wt 194 lb (88 kg)   BMI 31.55 kg/m   Wt Readings from Last 3 Encounters:   04/25/23 194 lb (88 kg)  12/15/22 188 lb 9.6 oz (85.5 kg)  05/26/22 173 lb (78.5 kg)    Physical Exam Vitals and nursing note reviewed.  Constitutional:      General: She is not in acute distress.    Appearance: Normal appearance.  HENT:     Head: Normocephalic and atraumatic.     Right Ear: Hearing, tympanic membrane, ear canal and external ear normal.     Left Ear: Hearing, tympanic membrane, ear canal and external ear normal.     Nose: Nose normal.     Right Sinus: No maxillary sinus tenderness or frontal  sinus tenderness.     Left Sinus: No maxillary sinus tenderness or frontal sinus tenderness.     Mouth/Throat:     Lips: Pink.     Mouth: Mucous membranes are moist.     Pharynx: Oropharynx is clear.  Eyes:     General: Lids are normal. Vision grossly intact.     Extraocular Movements: Extraocular movements intact.     Conjunctiva/sclera: Conjunctivae normal.     Pupils: Pupils are equal, round, and reactive to light.     Funduscopic exam:    Right eye: Red reflex present.        Left eye: Red reflex present.    Visual Fields: Right eye visual fields normal and left eye visual fields normal.  Neck:     Thyroid: No thyromegaly.     Vascular: No carotid bruit.  Cardiovascular:     Rate and Rhythm: Normal rate and regular rhythm.     Chest Wall: PMI is not displaced.     Pulses: Normal pulses.          Dorsalis pedis pulses are 2+ on the right side and 2+ on the left side.       Posterior tibial pulses are 2+ on the right side and 2+ on the left side.     Heart sounds: Normal heart sounds. No murmur heard. Pulmonary:     Effort: Pulmonary effort is normal. No respiratory distress.     Breath sounds: Normal breath sounds.  Abdominal:     General: Abdomen is flat. Bowel sounds are normal. There is no distension.     Palpations: Abdomen is soft. There is no hepatomegaly, splenomegaly or mass.     Tenderness: There is no abdominal tenderness. There is no right CVA  tenderness, left CVA tenderness, guarding or rebound.  Musculoskeletal:        General: Normal range of motion.     Cervical back: Full passive range of motion without pain, normal range of motion and neck supple. No tenderness.     Right lower leg: No edema.     Left lower leg: No edema.  Feet:     Left foot:     Toenail Condition: Left toenails are normal.  Lymphadenopathy:     Cervical: No cervical adenopathy.     Upper Body:     Right upper body: No supraclavicular adenopathy.     Left upper body: No supraclavicular adenopathy.  Skin:    General: Skin is warm and dry.     Capillary Refill: Capillary refill takes less than 2 seconds.     Nails: There is no clubbing.  Neurological:     General: No focal deficit present.     Mental Status: She is alert and oriented to person, place, and time.     GCS: GCS eye subscore is 4. GCS verbal subscore is 5. GCS motor subscore is 6.     Sensory: Sensation is intact.     Motor: Motor function is intact.     Coordination: Coordination is intact.     Gait: Gait is intact.     Deep Tendon Reflexes: Reflexes are normal and symmetric.  Psychiatric:        Attention and Perception: Attention normal.        Mood and Affect: Mood normal.        Speech: Speech normal.        Behavior: Behavior normal. Behavior is cooperative.  Thought Content: Thought content normal.        Cognition and Memory: Cognition and memory normal.        Judgment: Judgment normal.    Results for orders placed or performed in visit on 04/25/23  CBC with Differential/Platelet  Result Value Ref Range   WBC 5.4 3.4 - 10.8 x10E3/uL   RBC 4.81 3.77 - 5.28 x10E6/uL   Hemoglobin 14.7 11.1 - 15.9 g/dL   Hematocrit 16.1 09.6 - 46.6 %   MCV 90 79 - 97 fL   MCH 30.6 26.6 - 33.0 pg   MCHC 34.0 31.5 - 35.7 g/dL   RDW 04.5 40.9 - 81.1 %   Platelets 300 150 - 450 x10E3/uL   Neutrophils 57 Not Estab. %   Lymphs 32 Not Estab. %   Monocytes 7 Not Estab. %   Eos 3 Not  Estab. %   Basos 1 Not Estab. %   Neutrophils Absolute 3.1 1.4 - 7.0 x10E3/uL   Lymphocytes Absolute 1.7 0.7 - 3.1 x10E3/uL   Monocytes Absolute 0.4 0.1 - 0.9 x10E3/uL   EOS (ABSOLUTE) 0.1 0.0 - 0.4 x10E3/uL   Basophils Absolute 0.1 0.0 - 0.2 x10E3/uL   Immature Granulocytes 0 Not Estab. %   Immature Grans (Abs) 0.0 0.0 - 0.1 x10E3/uL  CMP14+EGFR  Result Value Ref Range   Glucose 88 70 - 99 mg/dL   BUN 15 6 - 24 mg/dL   Creatinine, Ser 9.14 0.57 - 1.00 mg/dL   eGFR 97 >78 GN/FAO/1.30   BUN/Creatinine Ratio 21 9 - 23   Sodium 140 134 - 144 mmol/L   Potassium 4.1 3.5 - 5.2 mmol/L   Chloride 102 96 - 106 mmol/L   CO2 25 20 - 29 mmol/L   Calcium 9.4 8.7 - 10.2 mg/dL   Total Protein 6.7 6.0 - 8.5 g/dL   Albumin 4.2 3.8 - 4.9 g/dL   Globulin, Total 2.5 1.5 - 4.5 g/dL   Bilirubin Total 0.3 0.0 - 1.2 mg/dL   Alkaline Phosphatase 62 44 - 121 IU/L   AST 14 0 - 40 IU/L   ALT 15 0 - 32 IU/L  Lipid panel  Result Value Ref Range   Cholesterol, Total 183 100 - 199 mg/dL   Triglycerides 84 0 - 149 mg/dL   HDL 94 >86 mg/dL   VLDL Cholesterol Cal 15 5 - 40 mg/dL   LDL Chol Calc (NIH) 74 0 - 99 mg/dL   Chol/HDL Ratio 1.9 0.0 - 4.4 ratio  Hemoglobin A1c  Result Value Ref Range   Hgb A1c MFr Bld 5.5 4.8 - 5.6 %   Est. average glucose Bld gHb Est-mCnc 111 mg/dL  HIV Antibody (routine testing w rflx)  Result Value Ref Range   HIV Screen 4th Generation wRfx Non Reactive Non Reactive  Hepatitis C antibody  Result Value Ref Range   Hep C Virus Ab Non Reactive Non Reactive  VITAMIN D 25 Hydroxy (Vit-D Deficiency, Fractures)  Result Value Ref Range   Vit D, 25-Hydroxy 66.1 30.0 - 100.0 ng/mL  TSH  Result Value Ref Range   TSH 1.910 0.450 - 4.500 uIU/mL  T4, free  Result Value Ref Range   Free T4 1.30 0.82 - 1.77 ng/dL         Assessment & Plan:   Problem List Items Addressed This Visit     Essential hypertension    BP elevated in the office today. Currently managed with  lisinopril 5mg . No symptoms present. No changes  to medications today.  Plan: - Labs pending - Monitor BP at home. Goal BP <130/80. If higher than this regularly, please let me know. We may need to make medication adjustments.        Relevant Orders   CBC with Differential/Platelet (Completed)   CMP14+EGFR (Completed)   Lipid panel (Completed)   Hemoglobin A1c (Completed)   Rosacea    Chronic. Previous treatments with dermatology. Tretinoin effective.  Plan: - Refill provided - Notify if adverse reactions present.       Senile purpura (HCC)    Present on upper extremities with no signs of bleeding at this time.  Plan: - Apply moisturizer daily to skin - Wear protective clothing to reduce risk of skin tearing.       Relevant Orders   CBC with Differential/Platelet (Completed)   CMP14+EGFR (Completed)   VITAMIN D 25 Hydroxy (Vit-D Deficiency, Fractures) (Completed)   BMI 31.0-31.9,adult    BMI 31.55 today. Patient is frustrated with lack of weight loss despite efforts for management. She has had success with [place] weight loss in the past and plans to return to them for assistance. Recommendations for diet and exercise provided.  Plan: - Consider meal tracking apps to help monitor intake of protein and calories  - Refer to AVS for specific recommendations.       Piriformis syndrome    Ongoing concerns with hip and gluteal pain thought to be piriformis, but not improving with home management and stretching.  Plan: - Referral to PT for evaluation and recommendation for exercise       Relevant Orders   Ambulatory referral to Physical Therapy   Routine physical examination - Primary    CPE completed today. Review of HM activities and recommendations discussed and provided on AVS. Anticipatory guidance, diet, and exercise recommendations provided. Medications, allergies, and hx reviewed and updated as necessary. Orders placed as listed below.  Plan: - Labs ordered. Will make  changes as necessary based on results.  - I will review these results and send recommendations via MyChart or a telephone call.  - F/U with CPE in 1 year or sooner for acute/chronic health needs as directed.        Relevant Orders   CBC with Differential/Platelet (Completed)   CMP14+EGFR (Completed)   Lipid panel (Completed)   Hemoglobin A1c (Completed)   HIV Antibody (routine testing w rflx) (Completed)   Hepatitis C antibody (Completed)   VITAMIN D 25 Hydroxy (Vit-D Deficiency, Fractures) (Completed)   TSH (Completed)   T4, free (Completed)       Follow up plan: Return in about 1 year (around 04/24/2024) for CPE.  NEXT PREVENTATIVE PHYSICAL DUE IN 1 YEAR.  PATIENT COUNSELING PROVIDED FOR ALL ADULT PATIENTS: A well balanced diet low in saturated fats, cholesterol, and moderation in carbohydrates.  This can be as simple as monitoring portion sizes and cutting back on sugary beverages such as soda and juice to start with.    Daily water consumption of at least 64 ounces.  Physical activity at least 180 minutes per week.  If just starting out, start 10 minutes a day and work your way up.   This can be as simple as taking the stairs instead of the elevator and walking 2-3 laps around the office  purposefully every day.   STD protection, partner selection, and regular testing if high risk.  Limited consumption of alcoholic beverages if alcohol is consumed. For men, I recommend no more than 14 alcoholic beverages  per week, spread out throughout the week (max 2 per day). Avoid "binge" drinking or consuming large quantities of alcohol in one setting.  Please let me know if you feel you may need help with reduction or quitting alcohol consumption.   Avoidance of nicotine, if used. Please let me know if you feel you may need help with reduction or quitting nicotine use.   Daily mental health attention. This can be in the form of 5 minute daily meditation, prayer, journaling, yoga,  reflection, etc.  Purposeful attention to your emotions and mental state can significantly improve your overall wellbeing  and  Health.  Please know that I am here to help you with all of your health care goals and am happy to work with you to find a solution that works best for you.  The greatest advice I have received with any changes in life are to take it one step at a time, that even means if all you can focus on is the next 60 seconds, then do that and celebrate your victories.  With any changes in life, you will have set backs, and that is OK. The important thing to remember is, if you have a set back, it is not a failure, it is an opportunity to try again! Screening Testing Mammogram Every 1 -2 years based on history and risk factors Starting at age 22 Pap Smear Ages 21-39 every 3 years Ages 36-65 every 5 years with HPV testing More frequent testing may be required based on results and history Colon Cancer Screening Every 1-10 years based on test performed, risk factors, and history Starting at age 45 Bone Density Screening Every 2-10 years based on history Starting at age 54 for women Recommendations for men differ based on medication usage, history, and risk factors AAA Screening One time ultrasound Men 46-70 years old who have every smoked Lung Cancer Screening Low Dose Lung CT every 12 months Age 66-80 years with a 30 pack-year smoking history who still smoke or who have quit within the last 15 years   Screening Labs Routine  Labs: Complete Blood Count (CBC), Complete Metabolic Panel (CMP), Cholesterol (Lipid Panel) Every 6-12 months based on history and medications May be recommended more frequently based on current conditions or previous results Hemoglobin A1c Lab Every 3-12 months based on history and previous results Starting at age 19 or earlier with diagnosis of diabetes, high cholesterol, BMI >26, and/or risk factors Frequent monitoring for patients with  diabetes to ensure blood sugar control Thyroid Panel (TSH) Every 6 months based on history, symptoms, and risk factors May be repeated more often if on medication HIV One time testing for all patients 35 and older May be repeated more frequently for patients with increased risk factors or exposure Hepatitis C One time testing for all patients 34 and older May be repeated more frequently for patients with increased risk factors or exposure Gonorrhea, Chlamydia Every 12 months for all sexually active persons 13-24 years Additional monitoring may be recommended for those who are considered high risk or who have symptoms Every 12 months for any woman on birth control, regardless of sexual activity PSA Men 5-37 years old with risk factors Additional screening may be recommended from age 68-69 based on risk factors, symptoms, and history  Vaccine Recommendations Tetanus Booster All adults every 10 years Flu Vaccine All patients 6 months and older every year COVID Vaccine All patients 12 years and older Initial dosing with booster May recommend additional booster  based on age and health history HPV Vaccine 2 doses all patients age 52-26 Dosing may be considered for patients over 26 Shingles Vaccine (Shingrix) 2 doses all adults 55 years and older Pneumonia (Pneumovax 30) All adults 65 years and older May recommend earlier dosing based on health history One year apart from Prevnar 13 Pneumonia (Prevnar 57) All adults 65 years and older Dosed 1 year after Pneumovax 23 Pneumonia (Prevnar 20) One time alternative to the two dosing of 13 and 23 For all adults with initial dose of 23, 20 is recommended 1 year later For all adults with initial dose of 13, 23 is still recommended as second option 1 year later

## 2023-04-25 NOTE — Patient Instructions (Addendum)
I have sent the referral for PT for the piriformis.    WEIGHT LOSS PLANNING  For best management of weight, it is vital to balance intake versus output. This means the number of calories burned per day must be less than the calories you take in with food and drink.   I recommend trying to follow a diet with the following: Calories: 1200-1500 calories per day Carbohydrates: 150-180 grams of carbohydrates per day  Why: Gives your body enough "quick fuel" for cells to maintain normal function without sending them into starvation mode.  Protein: At least 90 grams of protein per day- 30 grams with each meal Why: Protein takes longer and uses more energy than carbohydrates to break down for fuel. The carbohydrates in your meals serves as quick energy sources and proteins help use some of that extra quick energy to break down to produce long term energy. This helps you not feel hungry as quickly and protein breakdown burns calories.  Water: Drink AT LEAST 64 ounces of water per day  Why: Water is essential to healthy metabolism. Water helps to fill the stomach and keep you fuller longer. Water is required for healthy digestion and filtering of waste in the body.  Fat: Limit fats in your diet- when choosing fats, choose foods with lower fats content such as lean meats (chicken, fish, [place]).  Why: Increased fat intake leads to storage "for later". Once you burn your carbohydrate energy, your body goes into fat and protein breakdown mode to help you loose weight.  Cholesterol: Fats and oils that are LIQUID at room temperature are best. Choose vegetable oils (olive oil, avocado oil, nuts). Avoid fats that are SOLID at room temperature (animal fats, processed meats). Healthy fats are often found in whole grains, beans, nuts, seeds, and berries.  Why: Elevated cholesterol levels lead to build up of cholesterol on the inside of your blood vessels. This will eventually cause the blood vessels to become hard and  can lead to high blood pressure and damage to your organs. When the blood flow is reduced, but the pressure is high from cholesterol buildup, parts of the cholesterol can break off and form clots that can go to the brain or heart leading to a stroke or heart attack.  Fiber: Increase amount of SOLUBLE the fiber in your diet. This helps to fill you up, lowers cholesterol, and helps with digestion. Some foods high in soluble fiber are oats, peas, beans, apples, carrots, barley, and citrus fruits.   Why: Fiber fills you up, helps remove excess cholesterol, and aids in healthy digestion which are all very important in weight management.   I recommend the following as a minimum activity routine: Purposeful walk or other physical activity at least 20 minutes every single day. This means purposefully taking a walk, jog, bike, swim, treadmill, elliptical, dance, etc.  This activity should be ABOVE your normal daily activities, such as walking at work. Goal exercise should be at least 150 minutes a week- work your way up to this.   Heart Rate: Your maximum exercise heart rate should be 220 - Your Age in Years. When exercising, get your heart rate up, but avoid going over the maximum targeted heart rate.  60-70% of your maximum heart rate is where you tend to burn the most fat. To find this number:  220 - Age In Years= Max HR  Max HR x 0.6 (or 0.7) = Fat Burning HR The Fat Burning HR is your goal heart  rate while working out to burn the most fat.  NEVER exercise to the point your feel lightheaded, weak, nauseated, dizzy. If you experience ANY of these symptoms- STOP exercise! Allow yourself to cool down and your heart rate to come down. Then restart slower next time.  If at ANY TIME you feel chest pain or chest pressure during exercise, STOP IMMEDIATELY and seek medical attention.      For all adult patients, I recommend A well balanced diet low in saturated fats, cholesterol, and moderation in  carbohydrates.   This can be as simple as monitoring portion sizes and cutting back on sugary beverages such as soda and juice to start with.    Daily water consumption of at least 64 ounces.  Physical activity at least 180 minutes per week, if just starting out.   This can be as simple as taking the stairs instead of the elevator and walking 2-3 laps around the office  purposefully every day.   STD protection, partner selection, and regular testing if high risk.  Limited consumption of alcoholic beverages if alcohol is consumed.  For women, I recommend no more than 7 alcoholic beverages per week, spread out throughout the week.  Avoid "binge" drinking or consuming large quantities of alcohol in one setting.   Please let me know if you feel you may need help with reduction or quitting alcohol consumption.   Avoidance of nicotine, if used.  Please let me know if you feel you may need help with reduction or quitting nicotine use.   Daily mental health attention.  This can be in the form of 5 minute daily meditation, prayer, journaling, yoga, reflection, etc.   Purposeful attention to your emotions and mental state can significantly improve your overall wellbeing  and  Health.  Please know that I am here to help you with all of your health care goals and am happy to work with you to find a solution that works best for you.  The greatest advice I have received with any changes in life are to take it one step at a time, that even means if all you can focus on is the next 60 seconds, then do that and celebrate your victories.  With any changes in life, you will have set backs, and that is OK. The important thing to remember is, if you have a set back, it is not a failure, it is an opportunity to try again!  Health Maintenance Recommendations Screening Testing Mammogram Every 1 -2 years based on history and risk factors Starting at age 23 Pap Smear Ages 21-39 every 3 years Ages 47-65 every  5 years with HPV testing More frequent testing may be required based on results and history Colon Cancer Screening Every 1-10 years based on test performed, risk factors, and history Starting at age 49 Bone Density Screening Every 2-10 years based on history Starting at age 34 for women Recommendations for men differ based on medication usage, history, and risk factors AAA Screening One time ultrasound Men 35-34 years old who have every smoked Lung Cancer Screening Low Dose Lung CT every 12 months Age 63-80 years with a 30 pack-year smoking history who still smoke or who have quit within the last 15 years  Screening Labs Routine  Labs: Complete Blood Count (CBC), Complete Metabolic Panel (CMP), Cholesterol (Lipid Panel) Every 6-12 months based on history and medications May be recommended more frequently based on current conditions or previous results Hemoglobin A1c Lab Every 3-12  months based on history and previous results Starting at age 150 or earlier with diagnosis of diabetes, high cholesterol, BMI >26, and/or risk factors Frequent monitoring for patients with diabetes to ensure blood sugar control Thyroid Panel (TSH w/ T3 & T4) Every 6 months based on history, symptoms, and risk factors May be repeated more often if on medication HIV One time testing for all patients 49 and older May be repeated more frequently for patients with increased risk factors or exposure Hepatitis C One time testing for all patients 41 and older May be repeated more frequently for patients with increased risk factors or exposure Gonorrhea, Chlamydia Every 12 months for all sexually active persons 13-24 years Additional monitoring may be recommended for those who are considered high risk or who have symptoms PSA Men 64-69 years old with risk factors Additional screening may be recommended from age 81-69 based on risk factors, symptoms, and history  Vaccine Recommendations Tetanus Booster All  adults every 10 years Flu Vaccine All patients 6 months and older every year COVID Vaccine All patients 12 years and older Initial dosing with booster May recommend additional booster based on age and health history HPV Vaccine 2 doses all patients age 15-26 Dosing may be considered for patients over 26 Shingles Vaccine (Shingrix) 2 doses all adults 55 years and older Pneumonia (Pneumovax 23) All adults 65 years and older May recommend earlier dosing based on health history Pneumonia (Prevnar 51) All adults 65 years and older Dosed 1 year after Pneumovax 23  Additional Screening, Testing, and Vaccinations may be recommended on an individualized basis based on family history, health history, risk factors, and/or exposure.

## 2023-04-26 ENCOUNTER — Other Ambulatory Visit (HOSPITAL_COMMUNITY): Payer: Self-pay

## 2023-04-26 ENCOUNTER — Other Ambulatory Visit: Payer: Self-pay

## 2023-04-26 MED ORDER — PREMARIN 0.625 MG PO TABS
0.6250 mg | ORAL_TABLET | Freq: Every day | ORAL | 0 refills | Status: DC
  Filled 2023-04-26: qty 90, 90d supply, fill #0

## 2023-04-28 ENCOUNTER — Other Ambulatory Visit (HOSPITAL_COMMUNITY): Payer: Self-pay | Admitting: Oncology

## 2023-04-28 ENCOUNTER — Other Ambulatory Visit (HOSPITAL_COMMUNITY)
Admission: RE | Admit: 2023-04-28 | Discharge: 2023-04-28 | Disposition: A | Discharge status: Home / Self Care | Payer: Commercial Managed Care - PPO | Source: Ambulatory Visit | Attending: Oncology | Admitting: Oncology

## 2023-04-28 DIAGNOSIS — Z006 Encounter for examination for normal comparison and control in clinical research program: Secondary | ICD-10-CM

## 2023-05-10 NOTE — Assessment & Plan Note (Signed)

## 2023-05-10 NOTE — Assessment & Plan Note (Signed)
Chronic. Previous treatments with dermatology. Tretinoin effective.  Plan: - Refill provided - Notify if adverse reactions present.

## 2023-05-10 NOTE — Assessment & Plan Note (Signed)
Present on upper extremities with no signs of bleeding at this time.  Plan: - Apply moisturizer daily to skin - Wear protective clothing to reduce risk of skin tearing.

## 2023-05-10 NOTE — Assessment & Plan Note (Signed)
BMI 31.55 today. Patient is frustrated with lack of weight loss despite efforts for management. She has had success with [place] weight loss in the past and plans to return to them for assistance. Recommendations for diet and exercise provided.  Plan: - Consider meal tracking apps to help monitor intake of protein and calories  - Refer to AVS for specific recommendations.

## 2023-05-10 NOTE — Assessment & Plan Note (Signed)
Ongoing concerns with hip and gluteal pain thought to be piriformis, but not improving with home management and stretching.  Plan: - Referral to PT for evaluation and recommendation for exercise

## 2023-05-10 NOTE — Assessment & Plan Note (Signed)
BP elevated in the office today. Currently managed with lisinopril 5mg . No symptoms present. No changes to medications today.  Plan: - Labs pending - Monitor BP at home. Goal BP <130/80. If higher than this regularly, please let me know. We may need to make medication adjustments.

## 2023-05-22 NOTE — Therapy (Signed)
OUTPATIENT PHYSICAL THERAPY LOWER EXTREMITY EVALUATION   Patient Name: Monica Petty MRN: [id number] DOB:06-06-1966, 57 y.o., female Today's Date: 05/24/2023  END OF SESSION:  PT End of Session - 05/23/23 1628     Visit Number 1    Number of Visits 12    Date for PT Re-Evaluation 07/04/23    Authorization Type Gretta Began    PT Start Time 1543    PT Stop Time 1625    PT Time Calculation (min) 42 min    Activity Tolerance Patient tolerated treatment well    Behavior During Therapy [facility] for tasks assessed/performed             Past Medical History:  Diagnosis Date   Allergy    Complication of anesthesia    sob after 05-23-2019 sx for 1-2 days after got home   Diverticulosis of colon    GERD (gastroesophageal reflux disease)    hx of   History of colon polyps    History of concussion    1987  fell of horse--- no residual   Hypertension    Migraine    occasional   Rosacea    Rotator cuff tear    recurrent right side   Past Surgical History:  Procedure Laterality Date   COLONOSCOPY  06/06/2016   COSMETIC SURGERY  Abdominoplasty 5/23   LAPAROSCOPIC CHOLECYSTECTOMY  06-03-2008   dr Aleen Campi  [facility]   REDUCTION MAMMAPLASTY Bilateral 03-05-2002   dr barber  [facility]   SHOULDER ARTHROSCOPY WITH ROTATOR CUFF REPAIR Right 05/23/2019   Procedure: SHOULDER ARTHROSCOPY WITH revision rotator cuff repair and  debirdement;  Surgeon: Eugenia Mcalpine, MD;  Location: [facility] ;  Service: Orthopedics;  Laterality: Right;  interscalene block   SHOULDER ARTHROSCOPY WITH ROTATOR CUFF REPAIR Right 12/05/2019   Procedure: SHOULDER ARTHROSCOPY debridement revision rotator cuff repair, , with bone marrow aspiration from humerus;  Surgeon: Eugenia Mcalpine, MD;  Location: [facility];  Service: Orthopedics;  Laterality: Right;  exparel interscalene block   SHOULDER ARTHROSCOPY WITH ROTATOR CUFF REPAIR AND SUBACROMIAL DECOMPRESSION Right 04/17/2018   Procedure:  Right shoulder evaluation under anesthesia, scope, debridement, subacromial decompression, rotator cuff repair;  Surgeon: Eugenia Mcalpine, MD;  Location: [facility];  Service: Orthopedics;  Laterality: Right;  120 mins General with Intra Scalene Block   VAGINAL HYSTERECTOMY  02-12-2002   dr Jennette Kettle  [facility]   WISDOM TOOTH EXTRACTION  teen   Patient Active Problem List   Diagnosis Date Noted   Routine physical examination 04/25/2023   BMI 31.0-31.9,adult 12/26/2022   Piriformis syndrome 12/26/2022   Senile purpura (HCC) 06/18/2022   Diverticulitis 04/05/2022   Migraine 04/05/2022   S/P arthroscopy of right shoulder 04/17/2018   Dry eyes, bilateral 07/05/2017   Vaginal dryness, menopausal 07/05/2017   Other insomnia 07/05/2017   Essential hypertension 01/06/2016   Rosacea 01/06/2016   Heartburn 01/06/2016     REFERRING PROVIDER: Tollie Eth, NP  REFERRING DIAG: G57.02 (ICD-10-CM) - Piriformis syndrome of left side  THERAPY DIAG:  Pain in left hip  Muscle weakness (generalized)  Rationale for Evaluation and Treatment: Rehabilitation  ONSET DATE: December 2023  SUBJECTIVE:   SUBJECTIVE STATEMENT: Pt states her sx's began approx 9 months ago.  Pt had no specific injury to L hip.  She noticed L hip pain after she had been pulling trash cans up and down her driveway due to her husband's truck breaking down.  Pt has a long driveway and  has 2 big hills.  Pt states her sx's have not worsened and also have not improved.    She has difficulty sleeping due to pain.  She has to take Ibuprofen to sleep better.  Pt has pain with walking the driveway.  Pt states the pain limits her activity, but she pushes through it.  Pt has pain with prolonged sitting.  Pt has pain at work.     PERTINENT HISTORY: HTN, R shoulder RCR x3 with the last one in 2021--Pt has a 10 lb lifting restriction on R UE.   PAIN:  NPRS:  3-4/10 lying in bed, 6/10 worst, 0/10 best and current Location:  deep  in lateral hip.  Pain can radiate to knee and ankle.  Pt denies any N/T.  Type:  dull ache  PRECAUTIONS: {Therapy precautions:24002}  RED FLAGS: None   WEIGHT BEARING RESTRICTIONS: ---  FALLS:  Has patient fallen in last 6 months? No   OCCUPATION:  Pt is sitting/standing 50/50.  She works with [employer] at [employer].   PLOF: {PLOF:24004}  PATIENT GOALS: to be pain free, to learn exercises to resolve pain.  Improve sleeping   OBJECTIVE:   DIAGNOSTIC FINDINGS: N/A  PATIENT SURVEYS:  {rehab surveys:24030}  COGNITION: Overall cognitive status: Within functional limits for tasks assessed      PALPATION: TTP:  inferolateral glute  LOWER EXTREMITY ROM:  Active ROM Right eval Left eval  Hip flexion 99 102  Hip extension    Hip abduction [facility]  Hip adduction    Hip internal rotation    Hip external rotation 30 31 with pain  Knee flexion    Knee extension    Ankle dorsiflexion    Ankle plantarflexion    Ankle inversion    Ankle eversion     (Blank rows = not tested)  LOWER EXTREMITY MMT:  MMT Right eval Left eval  Hip flexion 5/5 5/5  Hip extension    Hip abduction 5/5 4/5  Hip adduction    Hip internal rotation 4/5 4+/5 with pain  Hip external rotation 5/5 5/5  Knee flexion 5/5 seated 5/5 seated  Knee extension 5/5 5/5  Ankle dorsiflexion    Ankle plantarflexion    Ankle inversion    Ankle eversion     (Blank rows = not tested)  LOWER EXTREMITY SPECIAL TESTS:  FABER's test:  negative bilat   GAIT:  Pt ambulates with a normalized heel to toe gait pattern without limping.   TODAY'S TREATMENT:                                                                                                                                Piriformis stretch, STW with ball on wall PATIENT EDUCATION:  Education details: *** Person educated: {Person educated:25204} Education method: {Education Method:25205} Education comprehension: {Education  Comprehension:25206}  HOME EXERCISE PROGRAM: Access Code: [email or web address] URL: [email or web address] Date: 05/23/2023 Prepared by: Aaron Edelman  Exercises - Supine Piriformis Stretch with Foot on Ground  - 2 x daily - 7 x weekly - 2-3 reps - 20-30 seconds hold - Standing Glute/piriformis Mobilization with Small Ball on Wall  - 1-2 x daily - 7 x weekly  ASSESSMENT:  CLINICAL IMPRESSION: Patient is a 57 y.o. female who was seen today for physical therapy evaluation and treatment for ***.   OBJECTIVE IMPAIRMENTS: {opptimpairments:25111}.   ACTIVITY LIMITATIONS: {activitylimitations:27494}  PARTICIPATION LIMITATIONS: {participationrestrictions:25113}  PERSONAL FACTORS: {Personal factors:25162} are also affecting patient's functional outcome.   REHAB POTENTIAL: Good  CLINICAL DECISION MAKING: Stable/uncomplicated  EVALUATION COMPLEXITY: Low   GOALS:   SHORT TERM GOALS: Target date: *** *** Baseline: Goal status: INITIAL  2.  *** Baseline:  Goal status: INITIAL  3.  *** Baseline:  Goal status: INITIAL  4.  *** Baseline:  Goal status: INITIAL  5.  *** Baseline:  Goal status: INITIAL  6.  *** Baseline:  Goal status: INITIAL  LONG TERM GOALS: Target date: ***  *** Baseline:  Goal status: INITIAL  2.  *** Baseline:  Goal status: INITIAL  3.  *** Baseline:  Goal status: INITIAL  4.  *** Baseline:  Goal status: INITIAL  5.  *** Baseline:  Goal status: INITIAL  6.  *** Baseline:  Goal status: INITIAL   PLAN:  PT FREQUENCY: {rehab frequency:25116}  PT DURATION: {rehab duration:25117}  PLANNED INTERVENTIONS: {rehab planned interventions:25118::"Therapeutic exercises","Therapeutic activity","Neuromuscular re-education","Balance training","Gait training","Patient/Family education","Self Care","Joint mobilization"}  PLAN FOR NEXT SESSION: Aaron Edelman, PT 05/24/2023, 11:04 PM

## 2023-05-23 ENCOUNTER — Encounter (HOSPITAL_BASED_OUTPATIENT_CLINIC_OR_DEPARTMENT_OTHER): Payer: Self-pay | Admitting: Physical Therapy

## 2023-05-23 ENCOUNTER — Ambulatory Visit (HOSPITAL_BASED_OUTPATIENT_CLINIC_OR_DEPARTMENT_OTHER): Payer: Commercial Managed Care - PPO | Attending: Nurse Practitioner | Admitting: Physical Therapy

## 2023-05-23 ENCOUNTER — Other Ambulatory Visit: Payer: Self-pay

## 2023-05-23 DIAGNOSIS — M25552 Pain in left hip: Secondary | ICD-10-CM | POA: Diagnosis not present

## 2023-05-23 DIAGNOSIS — M6281 Muscle weakness (generalized): Secondary | ICD-10-CM | POA: Diagnosis not present

## 2023-05-23 DIAGNOSIS — G5702 Lesion of sciatic nerve, left lower limb: Secondary | ICD-10-CM | POA: Diagnosis not present

## 2023-05-25 DIAGNOSIS — L821 Other seborrheic keratosis: Secondary | ICD-10-CM | POA: Diagnosis not present

## 2023-05-25 DIAGNOSIS — L570 Actinic keratosis: Secondary | ICD-10-CM | POA: Diagnosis not present

## 2023-05-25 DIAGNOSIS — I781 Nevus, non-neoplastic: Secondary | ICD-10-CM | POA: Diagnosis not present

## 2023-05-25 DIAGNOSIS — L718 Other rosacea: Secondary | ICD-10-CM | POA: Diagnosis not present

## 2023-05-27 ENCOUNTER — Ambulatory Visit (HOSPITAL_BASED_OUTPATIENT_CLINIC_OR_DEPARTMENT_OTHER): Payer: Commercial Managed Care - PPO | Admitting: Physical Therapy

## 2023-05-27 ENCOUNTER — Encounter (HOSPITAL_BASED_OUTPATIENT_CLINIC_OR_DEPARTMENT_OTHER): Payer: Self-pay | Admitting: Physical Therapy

## 2023-05-27 DIAGNOSIS — M25552 Pain in left hip: Secondary | ICD-10-CM | POA: Diagnosis not present

## 2023-05-27 DIAGNOSIS — M6281 Muscle weakness (generalized): Secondary | ICD-10-CM | POA: Diagnosis not present

## 2023-05-27 DIAGNOSIS — G5702 Lesion of sciatic nerve, left lower limb: Secondary | ICD-10-CM | POA: Diagnosis not present

## 2023-05-27 NOTE — Patient Instructions (Signed)

## 2023-05-27 NOTE — Therapy (Signed)
OUTPATIENT PHYSICAL THERAPY  TREATMENT   Patient Name: Monica Petty MRN: [id number] DOB:1966/02/20, 57 y.o., female Today's Date: 05/27/2023  END OF SESSION:  PT End of Session - 05/27/23 0959     Visit Number 2    Number of Visits 12    Date for PT Re-Evaluation 07/04/23    Authorization Type [place] AETNA    PT Start Time 1000    PT Stop Time 1040    PT Time Calculation (min) 40 min    Activity Tolerance Patient tolerated treatment well    Behavior During Therapy [facility] for tasks assessed/performed             Past Medical History:  Diagnosis Date   Allergy    Complication of anesthesia    sob after 05-23-2019 sx for 1-2 days after got home   Diverticulosis of colon    GERD (gastroesophageal reflux disease)    hx of   History of colon polyps    History of concussion    1987  fell of horse--- no residual   Hypertension    Migraine    occasional   Rosacea    Rotator cuff tear    recurrent right side   Past Surgical History:  Procedure Laterality Date   COLONOSCOPY  06/06/2016   COSMETIC SURGERY  Abdominoplasty 5/23   LAPAROSCOPIC CHOLECYSTECTOMY  06-03-2008   dr Aleen Campi  [facility]   REDUCTION MAMMAPLASTY Bilateral 03-05-2002   dr barber  [facility]   SHOULDER ARTHROSCOPY WITH ROTATOR CUFF REPAIR Right 05/23/2019   Procedure: SHOULDER ARTHROSCOPY WITH revision rotator cuff repair and  debirdement;  Surgeon: Eugenia Mcalpine, MD;  Location: [facility];  Service: Orthopedics;  Laterality: Right;  interscalene block   SHOULDER ARTHROSCOPY WITH ROTATOR CUFF REPAIR Right 12/05/2019   Procedure: SHOULDER ARTHROSCOPY debridement revision rotator cuff repair, , with bone marrow aspiration from humerus;  Surgeon: Eugenia Mcalpine, MD;  Location: [facility];  Service: Orthopedics;  Laterality: Right;  exparel interscalene block   SHOULDER ARTHROSCOPY WITH ROTATOR CUFF REPAIR AND SUBACROMIAL DECOMPRESSION Right 04/17/2018   Procedure: Right shoulder  evaluation under anesthesia, scope, debridement, subacromial decompression, rotator cuff repair;  Surgeon: Eugenia Mcalpine, MD;  Location: [facility];  Service: Orthopedics;  Laterality: Right;  120 mins General with Intra Scalene Block   VAGINAL HYSTERECTOMY  02-12-2002   dr Jennette Kettle  [facility]   WISDOM TOOTH EXTRACTION  teen   Patient Active Problem List   Diagnosis Date Noted   Routine physical examination 04/25/2023   BMI 31.0-31.9,adult 12/26/2022   Piriformis syndrome 12/26/2022   Senile purpura (HCC) 06/18/2022   Diverticulitis 04/05/2022   Migraine 04/05/2022   S/P arthroscopy of right shoulder 04/17/2018   Dry eyes, bilateral 07/05/2017   Vaginal dryness, menopausal 07/05/2017   Other insomnia 07/05/2017   Essential hypertension 01/06/2016   Rosacea 01/06/2016   Heartburn 01/06/2016     REFERRING PROVIDER: Tollie Eth, NP  REFERRING DIAG: G57.02 (ICD-10-CM) - Piriformis syndrome of left side  THERAPY DIAG:  Pain in left hip  Muscle weakness (generalized)  Rationale for Evaluation and Treatment: Rehabilitation  ONSET DATE: December 2023  SUBJECTIVE:   SUBJECTIVE STATEMENT: Patient states hip is about the same. HEP going well, 3x/day.    EVAL: Pt states her sx's began approx 9 months ago.  Pt had no specific injury to L hip.  She noticed L hip pain after she had been pulling trash cans up and down her driveway  due to her husband's truck breaking down.  Pt has a long driveway and has 2 big hills.  Pt states her sx's have not worsened and also have not improved.    She has difficulty sleeping due to pain.  She has to take Ibuprofen to sleep better.  Pt has pain with walking the driveway.  Pt states the pain limits her activity, but she pushes through it.  Pt has pain with prolonged sitting.  Pt has pain at work.     PERTINENT HISTORY: HTN, R shoulder RCR x3 with the last one in 2021,   Pt has a 10 lb lifting restriction on R UE.    PAIN:  NPRS:  3-4/10  lying in bed, 6/10 worst, 0/10 best and current Location:  deep in lateral hip.  Pain can radiate to knee and ankle.  Pt denies any N/T.  Type:  dull ache  PRECAUTIONS: None  RED FLAGS: None   WEIGHT BEARING RESTRICTIONS: none on LE, 10# lifting restriction on R UE  FALLS:  Has patient fallen in last 6 months? No   OCCUPATION:  Pt is sitting/standing 50/50.  She works with [employer] at [employer].   PLOF: Independent.  Pt able to perform work and daily activities and ambulation without hip pain.  PATIENT GOALS: to be pain free, to learn exercises to resolve pain.  Improve sleeping   OBJECTIVE:   DIAGNOSTIC FINDINGS: N/A  PATIENT SURVEYS:  FOTO 59 with a goal of 3 at visit 11  COGNITION: Overall cognitive status: Within functional limits for tasks assessed      PALPATION: TTP:  inferolateral glute  LOWER EXTREMITY ROM:  Active ROM Right eval Left eval  Hip flexion 99 102  Hip extension    Hip abduction [facility]  Hip adduction    Hip internal rotation    Hip external rotation 30 31 with pain  Knee flexion    Knee extension    Ankle dorsiflexion    Ankle plantarflexion    Ankle inversion    Ankle eversion     (Blank rows = not tested)  LOWER EXTREMITY MMT:  MMT Right eval Left eval  Hip flexion 5/5 5/5  Hip extension    Hip abduction 5/5 4/5  Hip adduction    Hip internal rotation 4/5 4+/5 with pain  Hip external rotation 5/5 5/5  Knee flexion 5/5 seated 5/5 seated  Knee extension 5/5 5/5  Ankle dorsiflexion    Ankle plantarflexion    Ankle inversion    Ankle eversion     (Blank rows = not tested)  LOWER EXTREMITY SPECIAL TESTS:  FABER's test:  negative bilat   GAIT:  Pt ambulates with a normalized heel to toe gait pattern without limping.   TODAY'S TREATMENT:  05/27/23 Supine piriformis stretch 3 x 20 second  holds Supine piriformis stretch knee to contralateral shoulder holds 3 x 20 second holds TTP L piriformis  Manual: STM to L  pre and post dry needling for trigger point identification and muscular relaxation.  Trigger Point Dry-Needling  Treatment instructions: Expect mild to moderate muscle soreness. S/S of pneumothorax if dry needled over a lung field, and to seek immediate medical attention should they occur. Patient verbalized understanding of these instructions and education.  Patient Consent Given: Yes Education handout provided: Yes Muscles treated: L piriformis Electrical stimulation performed: No Parameters: N/A Treatment response/outcome: decrease in tissue tension possible decrease in symptoms Sidelying clam 2 x 15 Bridge 2 x 10 Prone hip extension 1 x 10 bilateral   05/23/23 Pt performed supine piriformis stretch 3x30 sec on L and 2x30 sec on R.  PT instructed pt in using a tennis ball on the wall to improve soft tissue mobility and TPR in glute/piriformis.  Pt used a tennis ball in a pillowcase on the wall on L glute with PT instruction.  Pt received a HEP handout and was educated in correct form and appropriate frequency.    PATIENT EDUCATION:  Education details: dx, objective findings, rationale of interventions, POC, relevant anatomy, and HEP. Person educated: Patient Education method: Explanation, Demonstration, Tactile cues, Verbal cues, and Handouts Education comprehension: verbalized understanding, returned demonstration, verbal cues required, tactile cues required, and needs further education  HOME EXERCISE PROGRAM: Access Code: [facility] URL: [email or web address] Date: 05/23/2023 Prepared by: Aaron Edelman  Exercises - Supine Piriformis Stretch with Foot on Ground  - 2 x daily - 7 x weekly - 2-3 reps - 20-30 seconds hold - Standing Glute/piriformis Mobilization with Small Ball on Wall  - 1-2 x daily - 7 x weekly  05/27/23 - Clamshell (Mirrored)  -  1 x daily - 7 x weekly - 3 sets - 15 reps - Supine Bridge  - 1 x daily - 7 x weekly - 3 sets - 10 reps  ASSESSMENT:  CLINICAL IMPRESSION: Continued with previously completed HEP which is performed well. Began additional mobility and glute strength which are tolerated well. Patient with tender and hyperactive piriformis. Performed STM and DN to L piriformis with decrease in tissue tension. Patient will continue to benefit from physical therapy in order to improve function and reduce impairment.      OBJECTIVE IMPAIRMENTS: decreased activity tolerance, decreased mobility, decreased strength, increased fascial restrictions, and pain.   ACTIVITY LIMITATIONS: sitting and sleeping  PARTICIPATION LIMITATIONS: occupation  PERSONAL FACTORS:    REHAB POTENTIAL: Good  CLINICAL DECISION MAKING: Stable/uncomplicated  EVALUATION COMPLEXITY: Low   GOALS:   SHORT TERM GOALS: Target date: 06/13/2023  Pt will be independent and compliant with HEP for improved pain, strength, and function.  Baseline: Goal status: INITIAL  2.  Pt will report at least a 25% improvement in pain and sx's overall.    Baseline:  Goal status: INITIAL  3.  Pt will report reduced pain at work. Baseline:  Goal status: INITIAL    LONG TERM GOALS: Target date: 07/04/2023  Pt will demo improved L hip abd strength to 5/5 MMT for improved pain with mobility.   Baseline:  Goal status: INITIAL  2.  Pt will be able to sleep without pain disturbing her.   Baseline:  Goal status: INITIAL  3.  Pt will report at least a 70% improvement in pain and sx's overall.  Baseline:  Goal status: INITIAL  4.  Pt will report she is able to ambulate her driveway without significant pain.  Baseline:  Goal status: INITIAL     PLAN:  PT FREQUENCY: 2x/week  PT DURATION: 6 weeks  PLANNED INTERVENTIONS: Therapeutic exercises, Therapeutic activity, Neuromuscular re-education, Gait training, Patient/Family education, Self  Care, Joint mobilization, Stair training, Aquatic Therapy, Dry Needling, Electrical stimulation, Spinal mobilization, Cryotherapy, Moist heat, Taping, Ultrasound, Manual therapy, and Re-evaluation  PLAN FOR NEXT SESSION: review and perform HEP.   STW to glute/piriformis. F/u with DN    Reola Mosher Alayziah Tangeman, PT 05/27/2023, 10:41 AM

## 2023-05-29 ENCOUNTER — Ambulatory Visit: Payer: Commercial Managed Care - PPO | Admitting: Podiatry

## 2023-05-29 ENCOUNTER — Other Ambulatory Visit (HOSPITAL_COMMUNITY): Payer: Self-pay

## 2023-05-29 ENCOUNTER — Encounter (HOSPITAL_COMMUNITY): Payer: Self-pay

## 2023-05-29 ENCOUNTER — Ambulatory Visit (INDEPENDENT_AMBULATORY_CARE_PROVIDER_SITE_OTHER): Payer: Commercial Managed Care - PPO

## 2023-05-29 ENCOUNTER — Other Ambulatory Visit: Payer: Self-pay | Admitting: Podiatry

## 2023-05-29 DIAGNOSIS — M779 Enthesopathy, unspecified: Secondary | ICD-10-CM | POA: Diagnosis not present

## 2023-05-29 DIAGNOSIS — M79671 Pain in right foot: Secondary | ICD-10-CM | POA: Diagnosis not present

## 2023-05-29 DIAGNOSIS — M778 Other enthesopathies, not elsewhere classified: Secondary | ICD-10-CM

## 2023-05-29 DIAGNOSIS — M19071 Primary osteoarthritis, right ankle and foot: Secondary | ICD-10-CM | POA: Diagnosis not present

## 2023-05-29 MED ORDER — MELOXICAM 15 MG PO TABS
15.0000 mg | ORAL_TABLET | Freq: Every day | ORAL | 0 refills | Status: DC | PRN
  Filled 2023-05-29: qty 30, 30d supply, fill #0

## 2023-05-29 NOTE — Patient Instructions (Addendum)

## 2023-05-29 NOTE — Progress Notes (Addendum)
Subjective:   Patient ID: Gaspar Garbe, female   DOB: 57 y.o.   MRN: [id number]   HPI Chief Complaint  Patient presents with   Foot Pain    Right foot pain for about 2 years pain is on the top of the foot    83 female presents For above concerns.  She says that she has pain in the top of her right foot which been ongoing for 2 years.  She states that she has a steep driveway and walking on a hill bothers it.  She states her foot throbs at night.  She tried Voltaren gel which did not help.  No injuries that she states.   Review of Systems  All other systems reviewed and are negative.  Past Medical History:  Diagnosis Date   Allergy    Complication of anesthesia    sob after 05-23-2019 sx for 1-2 days after got home   Diverticulosis of colon    GERD (gastroesophageal reflux disease)    hx of   History of colon polyps    History of concussion    1987  fell of horse--- no residual   Hypertension    Migraine    occasional   Rosacea    Rotator cuff tear    recurrent right side    Past Surgical History:  Procedure Laterality Date   COLONOSCOPY  06/06/2016   COSMETIC SURGERY  Abdominoplasty 5/23   LAPAROSCOPIC CHOLECYSTECTOMY  06-03-2008   dr Aleen Campi  [facility]   REDUCTION MAMMAPLASTY Bilateral 03-05-2002   dr barber  [facility]   SHOULDER ARTHROSCOPY WITH ROTATOR CUFF REPAIR Right 05/23/2019   Procedure: SHOULDER ARTHROSCOPY WITH revision rotator cuff repair and  debirdement;  Surgeon: Eugenia Mcalpine, MD;  Location: [facility];  Service: Orthopedics;  Laterality: Right;  interscalene block   SHOULDER ARTHROSCOPY WITH ROTATOR CUFF REPAIR Right 12/05/2019   Procedure: SHOULDER ARTHROSCOPY debridement revision rotator cuff repair, , with bone marrow aspiration from humerus;  Surgeon: Eugenia Mcalpine, MD;  Location: [facility];  Service: Orthopedics;  Laterality: Right;  exparel interscalene block   SHOULDER ARTHROSCOPY WITH ROTATOR CUFF REPAIR AND  SUBACROMIAL DECOMPRESSION Right 04/17/2018   Procedure: Right shoulder evaluation under anesthesia, scope, debridement, subacromial decompression, rotator cuff repair;  Surgeon: Eugenia Mcalpine, MD;  Location: [facility];  Service: Orthopedics;  Laterality: Right;  120 mins General with Intra Scalene Block   VAGINAL HYSTERECTOMY  02-12-2002   dr Jennette Kettle  [facility]   WISDOM TOOTH EXTRACTION  teen     Current Outpatient Medications:    meloxicam (MOBIC) 15 MG tablet, Take 1 tablet (15 mg total) by mouth daily as needed for pain., Disp: 30 tablet, Rfl: 0   bimatoprost (LATISSE) 0.03 % ophthalmic solution, Apply 1 drop via applicator once a day as directed (Patient not taking: Reported on 04/25/2023), Disp: 5 mL, Rfl: 11   cycloSPORINE (RESTASIS) 0.05 % ophthalmic emulsion, Instill 1 drop in each eye twice daily as directed, Disp: 180 each, Rfl: 3   estradiol (ESTRACE) 0.1 MG/GM vaginal cream, INSERT 1 GRAM VAGINALLY ONCE A WEEK, Disp: 42.5 g, Rfl: 3   estrogens, conjugated, (PREMARIN) 0.625 MG tablet, Take 1 tablet (0.625 mg total) by mouth daily., Disp: 90 tablet, Rfl: 0   hydrochlorothiazide (HYDRODIURIL) 25 MG tablet, Take 1 tablet (25 mg total) by mouth daily., Disp: 90 tablet, Rfl: 3   lisinopril (ZESTRIL) 5 MG tablet, Take 1 tablet (5 mg total) by mouth daily., Disp: 90  tablet, Rfl: 3   Multiple Vitamins-Minerals (MULTIVITAMIN ADULT PO), Take 1 capsule by mouth daily. , Disp: , Rfl:    tretinoin (RETIN-A) 0.025 % cream, Apply to affected area once a day as needed for rash breakout., Disp: 45 g, Rfl: 3  Allergies  Allergen Reactions   Adhesive [Tape]     Rips skin, likes paper tape   Codeine Other (See Comments)    tinnitis and photophobia    Sulfa Antibiotics Other (See Comments)    tinnitis and photophobia   Penicillins Rash          Objective:  Physical Exam  General: AAO x3, NAD  Dermatological: Skin is warm, dry and supple bilateral.  There are no open sores, no  preulcerative lesions, no rash or signs of infection present.  Vascular: Dorsalis Pedis artery and Posterior Tibial artery pedal pulses are 2/4 bilateral with immedate capillary fill time. There is no pain with calf compression, swelling, warmth, erythema.   Neruologic: Grossly intact via light touch bilateral.  Musculoskeletal: There is tenderness palpation on the dorsal aspect of the patient's right foot mostly along the Lisfranc joint most notably along the second metatarsal cuneiform joint.  There is some dorsal prominence present.  There is some trace edema present no erythema or warmth.  Flexor, extensor tendons.  Intact.  MMT 5/5.  Gait: Unassisted, Nonantalgic.       Assessment:  57 year old female with capsulitis, arthritis right midfoot      Plan:  -Treatment options discussed including all alternatives, risks, and complications -Etiology of symptoms were discussed -X-rays were obtained and reviewed with the patient.  3 views right foot were obtained there is no evidence of acute fracture.  Arthritic changes present with joint space narrowing most notably along the medical tarsal cuneiform joint on the Lisfranc. -I discussed the conservative as well as surgical options given several conservative options.  Since she is, good support as well as wearing stiffer soled shoes.  Today steroid injections for along the area maximal tenderness after discussing risks of injections.  I cleaned the skin with alcohol and mixture 1 cc Kenalog 10, 0.5 cc of Marcaine plain, 0.5 cc of lidocaine plain was infiltrated on the area of maximal tenderness most notably along the second metatarsal cuneiform joint.  She tolerated well.  Postinjection care discussed. -Prescribed mobic. Discussed side effects of the medication and directed to stop if any are to occur and call the office.  -Follow up for orthotics   Vivi Barrack DPM

## 2023-05-30 ENCOUNTER — Other Ambulatory Visit (HOSPITAL_COMMUNITY): Payer: Self-pay

## 2023-05-30 ENCOUNTER — Ambulatory Visit (HOSPITAL_BASED_OUTPATIENT_CLINIC_OR_DEPARTMENT_OTHER): Payer: Commercial Managed Care - PPO | Admitting: Physical Therapy

## 2023-05-30 ENCOUNTER — Other Ambulatory Visit: Payer: Self-pay

## 2023-05-30 ENCOUNTER — Encounter (HOSPITAL_BASED_OUTPATIENT_CLINIC_OR_DEPARTMENT_OTHER): Payer: Self-pay | Admitting: Physical Therapy

## 2023-05-30 DIAGNOSIS — M25552 Pain in left hip: Secondary | ICD-10-CM | POA: Diagnosis not present

## 2023-05-30 DIAGNOSIS — M6281 Muscle weakness (generalized): Secondary | ICD-10-CM | POA: Diagnosis not present

## 2023-05-30 DIAGNOSIS — G5702 Lesion of sciatic nerve, left lower limb: Secondary | ICD-10-CM | POA: Diagnosis not present

## 2023-05-30 NOTE — Therapy (Signed)
OUTPATIENT PHYSICAL THERAPY  TREATMENT   Patient Name: Monica Petty MRN: [id number] DOB:[birth year], 57 y.o., female Today's Date: 05/30/2023  END OF SESSION:  PT End of Session - 05/30/23 0733     Visit Number 3    Number of Visits 12    Date for PT Re-Evaluation 07/04/23    Authorization Type Gretta Began    PT Start Time 4098    PT Stop Time 0811    PT Time Calculation (min) 38 min    Activity Tolerance Patient tolerated treatment well    Behavior During Therapy [facility] for tasks assessed/performed             Past Medical History:  Diagnosis Date   Allergy    Complication of anesthesia    sob after 05-23-2019 sx for 1-2 days after got home   Diverticulosis of colon    GERD (gastroesophageal reflux disease)    hx of   History of colon polyps    History of concussion    1987  fell of horse--- no residual   Hypertension    Migraine    occasional   Rosacea    Rotator cuff tear    recurrent right side   Past Surgical History:  Procedure Laterality Date   COLONOSCOPY  06/06/2016   COSMETIC SURGERY  Abdominoplasty 5/23   LAPAROSCOPIC CHOLECYSTECTOMY  06-03-2008   dr Aleen Campi  [facility]   REDUCTION MAMMAPLASTY Bilateral 03-05-2002   dr barber  [facility]   SHOULDER ARTHROSCOPY WITH ROTATOR CUFF REPAIR Right 05/23/2019   Procedure: SHOULDER ARTHROSCOPY WITH revision rotator cuff repair and  debirdement;  Surgeon: Eugenia Mcalpine, MD;  Location: [facility];  Service: Orthopedics;  Laterality: Right;  interscalene block   SHOULDER ARTHROSCOPY WITH ROTATOR CUFF REPAIR Right 12/05/2019   Procedure: SHOULDER ARTHROSCOPY debridement revision rotator cuff repair, , with bone marrow aspiration from humerus;  Surgeon: Eugenia Mcalpine, MD;  Location: [facility];  Service: Orthopedics;  Laterality: Right;  exparel interscalene block   SHOULDER ARTHROSCOPY WITH ROTATOR CUFF REPAIR AND SUBACROMIAL DECOMPRESSION Right 04/17/2018   Procedure: Right shoulder  evaluation under anesthesia, scope, debridement, subacromial decompression, rotator cuff repair;  Surgeon: Eugenia Mcalpine, MD;  Location: [facility];  Service: Orthopedics;  Laterality: Right;  120 mins General with Intra Scalene Block   VAGINAL HYSTERECTOMY  02-12-2002   dr Jennette Kettle  [facility]   WISDOM TOOTH EXTRACTION  teen   Patient Active Problem List   Diagnosis Date Noted   Routine physical examination 04/25/2023   BMI 31.0-31.9,adult 12/26/2022   Piriformis syndrome 12/26/2022   Senile purpura (HCC) 06/18/2022   Diverticulitis 04/05/2022   Migraine 04/05/2022   S/P arthroscopy of right shoulder 04/17/2018   Dry eyes, bilateral 07/05/2017   Vaginal dryness, menopausal 07/05/2017   Other insomnia 07/05/2017   Essential hypertension 01/06/2016   Rosacea 01/06/2016   Heartburn 01/06/2016     REFERRING PROVIDER: Tollie Eth, NP  REFERRING DIAG: G57.02 (ICD-10-CM) - Piriformis syndrome of left side  THERAPY DIAG:  Pain in left hip  Muscle weakness (generalized)  Rationale for Evaluation and Treatment: Rehabilitation  ONSET DATE: December 2023  SUBJECTIVE:   SUBJECTIVE STATEMENT: "I noticed a big difference (after DN)"   EVAL: Pt states her sx's began approx 9 months ago.  Pt had no specific injury to L hip.  She noticed L hip pain after she had been pulling trash cans up and down her driveway due to her husband's truck  breaking down.  Pt has a long driveway and has 2 big hills.  Pt states her sx's have not worsened and also have not improved.    She has difficulty sleeping due to pain.  She has to take Ibuprofen to sleep better.  Pt has pain with walking the driveway.  Pt states the pain limits her activity, but she pushes through it.  Pt has pain with prolonged sitting.  Pt has pain at work.     PERTINENT HISTORY: HTN, R shoulder RCR x3 with the last one in 2021,   Pt has a 10 lb lifting restriction on R UE.    PAIN:  NPRS:  0/10 Location:  Lt hip    Type:    PRECAUTIONS: None  RED FLAGS: None   WEIGHT BEARING RESTRICTIONS: none on LE, 10# lifting restriction on R UE  FALLS:  Has patient fallen in last 6 months? No   OCCUPATION:  Pt is sitting/standing 50/50.  She works with [employer] at [employer].   PLOF: Independent.  Pt able to perform work and daily activities and ambulation without hip pain.  PATIENT GOALS: to be pain free, to learn exercises to resolve pain.  Improve sleeping   OBJECTIVE:   DIAGNOSTIC FINDINGS: N/A  PATIENT SURVEYS:  FOTO 59 with a goal of 66 at visit 11  COGNITION: Overall cognitive status: Within functional limits for tasks assessed      PALPATION: TTP:  inferolateral glute  LOWER EXTREMITY ROM:  Active ROM Right eval Left eval  Hip flexion 99 102  Hip extension    Hip abduction [facility]  Hip adduction    Hip internal rotation    Hip external rotation 30 31 with pain  Knee flexion    Knee extension    Ankle dorsiflexion    Ankle plantarflexion    Ankle inversion    Ankle eversion     (Blank rows = not tested)  LOWER EXTREMITY MMT:  MMT Right eval Left eval  Hip flexion 5/5 5/5  Hip extension    Hip abduction 5/5 4/5  Hip adduction    Hip internal rotation 4/5 4+/5 with pain  Hip external rotation 5/5 5/5  Knee flexion 5/5 seated 5/5 seated  Knee extension 5/5 5/5  Ankle dorsiflexion    Ankle plantarflexion    Ankle inversion    Ankle eversion     (Blank rows = not tested)  LOWER EXTREMITY SPECIAL TESTS:  FABER's test:  negative bilat   GAIT:  Pt ambulates with a normalized heel to toe gait pattern without limping.   TODAY'S TREATMENT:                                                                                                                               05/30/23 Therapeutic exercise: Treadmill at 2. x 3.5 min for warm up Prone hip ext, 2 x 10 each LE Sidelying clams x 10 each  LE; reverse clams x 10 each LE;  repeated on LLE 2nd  set Bridges x 10 with scap/ab/glute set  Hooklying piriformis stretch (pulling towards opp shoulder) 20 sec, L/R/L  Bridge with 5 sec hold, x 10 Sidelying Lt hip abdct with heel leading to ceiling x 10 Windshield wiper stretch (hooklying ,wide feet with knees falling to one side) Manual therapy: (pt in prone position) MFR to Lt glute med/max TPR to Lt glute med with active hip  IR/ER ; STM to Lt hip upper lateral thigh to decrease fascial restriction and improve mobility  05/27/23 Supine piriformis stretch 3 x 20 second holds Supine piriformis stretch knee to contralateral shoulder holds 3 x 20 second holds TTP L piriformis  Manual: STM to L  pre and post dry needling for trigger point identification and muscular relaxation.  Trigger Point Dry-Needling  Treatment instructions: Expect mild to moderate muscle soreness. S/S of pneumothorax if dry needled over a lung field, and to seek immediate medical attention should they occur. Patient verbalized understanding of these instructions and education.  Patient Consent Given: Yes Education handout provided: Yes Muscles treated: L piriformis Electrical stimulation performed: No Parameters: N/A Treatment response/outcome: decrease in tissue tension possible decrease in symptoms Sidelying clam 2 x 15 Bridge 2 x 10 Prone hip extension 1 x 10 bilateral   05/23/23 Pt performed supine piriformis stretch 3x30 sec on L and 2x30 sec on R.  PT instructed pt in using a tennis ball on the wall to improve soft tissue mobility and TPR in glute/piriformis.  Pt used a tennis ball in a pillowcase on the wall on L glute with PT instruction.  Pt received a HEP handout and was educated in correct form and appropriate frequency.    PATIENT EDUCATION:  Education details: exercise rationale and form Person educated: Patient Education method: Explanation, Demonstration, Tactile cues, Verbal cues, and Handouts Education comprehension: verbalized understanding,  returned demonstration, verbal cues required, tactile cues required, and needs further education  HOME EXERCISE PROGRAM: Access Code: [facility] URL: [email or web address] Date: 05/23/2023 Prepared by: Aaron Edelman  Exercises - Supine Piriformis Stretch with Foot on Ground  - 2 x daily - 7 x weekly - 2-3 reps - 20-30 seconds hold - Standing Glute/piriformis Mobilization with Small Ball on Wall  - 1-2 x daily - 7 x weekly  05/27/23 - Clamshell (Mirrored)  - 1 x daily - 7 x weekly - 3 sets - 15 reps - Supine Bridge  - 1 x daily - 7 x weekly - 3 sets - 10 reps  ASSESSMENT:  CLINICAL IMPRESSION: Positive response to DN last session.  She tolerated all exercises well today, with fatigue noted but not increase in pain.  She will benefit from continued manual therapy/DN to Lt glute med/max and piriformis.  Patient will continue to benefit from physical therapy in order to improve function and reduce impairment.  Goals are ongoing.       OBJECTIVE IMPAIRMENTS: decreased activity tolerance, decreased mobility, decreased strength, increased fascial restrictions, and pain.   ACTIVITY LIMITATIONS: sitting and sleeping  PARTICIPATION LIMITATIONS: occupation  PERSONAL FACTORS:    REHAB POTENTIAL: Good  CLINICAL DECISION MAKING: Stable/uncomplicated  EVALUATION COMPLEXITY: Low   GOALS:   SHORT TERM GOALS: Target date: 06/13/2023  Pt will be independent and compliant with HEP for improved pain, strength, and function.  Baseline: Goal status: INITIAL  2.  Pt will report at least a 25% improvement in pain and sx's overall.    Baseline:  Goal  status: INITIAL  3.  Pt will report reduced pain at work. Baseline:  Goal status: INITIAL    LONG TERM GOALS: Target date: 07/04/2023  Pt will demo improved L hip abd strength to 5/5 MMT for improved pain with mobility.   Baseline:  Goal status: INITIAL  2.  Pt will be able to sleep without pain disturbing her.   Baseline:   Goal status: INITIAL  3.  Pt will report at least a 70% improvement in pain and sx's overall.  Baseline:  Goal status: INITIAL  4.  Pt will report she is able to ambulate her driveway without significant pain.  Baseline:  Goal status: INITIAL     PLAN:  PT FREQUENCY: 2x/week  PT DURATION: 6 weeks  PLANNED INTERVENTIONS: Therapeutic exercises, Therapeutic activity, Neuromuscular re-education, Gait training, Patient/Family education, Self Care, Joint mobilization, Stair training, Aquatic Therapy, Dry Needling, Electrical stimulation, Spinal mobilization, Cryotherapy, Moist heat, Taping, Ultrasound, Manual therapy, and Re-evaluation  PLAN FOR NEXT SESSION: progress HEP as tolerated.   STW to Lt glute/piriformis.    Mayer Camel, PTA 05/30/23 2:00 PM [facility] [address] [place], [place], [address] Phone: [phone]   Fax:  [phone]

## 2023-05-31 ENCOUNTER — Other Ambulatory Visit: Payer: Self-pay | Admitting: Podiatry

## 2023-05-31 DIAGNOSIS — M79671 Pain in right foot: Secondary | ICD-10-CM

## 2023-05-31 DIAGNOSIS — M779 Enthesopathy, unspecified: Secondary | ICD-10-CM

## 2023-06-05 ENCOUNTER — Ambulatory Visit (HOSPITAL_BASED_OUTPATIENT_CLINIC_OR_DEPARTMENT_OTHER): Payer: Commercial Managed Care - PPO

## 2023-06-05 ENCOUNTER — Encounter (HOSPITAL_BASED_OUTPATIENT_CLINIC_OR_DEPARTMENT_OTHER): Payer: Self-pay

## 2023-06-05 DIAGNOSIS — M6281 Muscle weakness (generalized): Secondary | ICD-10-CM | POA: Diagnosis not present

## 2023-06-05 DIAGNOSIS — M25552 Pain in left hip: Secondary | ICD-10-CM

## 2023-06-05 DIAGNOSIS — G5702 Lesion of sciatic nerve, left lower limb: Secondary | ICD-10-CM | POA: Diagnosis not present

## 2023-06-05 NOTE — Therapy (Signed)
OUTPATIENT PHYSICAL THERAPY  TREATMENT   Patient Name: Monica Petty MRN: [id number] DOB:[birth year], 57 y.o., female Today's Date: 06/05/2023  END OF SESSION:  PT End of Session - 06/05/23 0804     Visit Number 4    Number of Visits 12    Date for PT Re-Evaluation 07/04/23    Authorization Type Gretta Began    PT Start Time 0804    PT Stop Time 0848    PT Time Calculation (min) 44 min    Activity Tolerance Patient tolerated treatment well    Behavior During Therapy [facility] for tasks assessed/performed              Past Medical History:  Diagnosis Date   Allergy    Complication of anesthesia    sob after 05-23-2019 sx for 1-2 days after got home   Diverticulosis of colon    GERD (gastroesophageal reflux disease)    hx of   History of colon polyps    History of concussion    1987  fell of horse--- no residual   Hypertension    Migraine    occasional   Rosacea    Rotator cuff tear    recurrent right side   Past Surgical History:  Procedure Laterality Date   COLONOSCOPY  06/06/2016   COSMETIC SURGERY  Abdominoplasty 5/23   LAPAROSCOPIC CHOLECYSTECTOMY  06-03-2008   dr Aleen Campi  [facility]   REDUCTION MAMMAPLASTY Bilateral 03-05-2002   dr barber  [facility]   SHOULDER ARTHROSCOPY WITH ROTATOR CUFF REPAIR Right 05/23/2019   Procedure: SHOULDER ARTHROSCOPY WITH revision rotator cuff repair and  debirdement;  Surgeon: Eugenia Mcalpine, MD;  Location: [facility];  Service: Orthopedics;  Laterality: Right;  interscalene block   SHOULDER ARTHROSCOPY WITH ROTATOR CUFF REPAIR Right 12/05/2019   Procedure: SHOULDER ARTHROSCOPY debridement revision rotator cuff repair, , with bone marrow aspiration from humerus;  Surgeon: Eugenia Mcalpine, MD;  Location: [facility];  Service: Orthopedics;  Laterality: Right;  exparel interscalene block   SHOULDER ARTHROSCOPY WITH ROTATOR CUFF REPAIR AND SUBACROMIAL DECOMPRESSION Right 04/17/2018   Procedure: Right shoulder  evaluation under anesthesia, scope, debridement, subacromial decompression, rotator cuff repair;  Surgeon: Eugenia Mcalpine, MD;  Location: [facility];  Service: Orthopedics;  Laterality: Right;  120 mins General with Intra Scalene Block   VAGINAL HYSTERECTOMY  02-12-2002   dr Jennette Kettle  [facility]   WISDOM TOOTH EXTRACTION  teen   Patient Active Problem List   Diagnosis Date Noted   Routine physical examination 04/25/2023   BMI 31.0-31.9,adult 12/26/2022   Piriformis syndrome 12/26/2022   Senile purpura (HCC) 06/18/2022   Diverticulitis 04/05/2022   Migraine 04/05/2022   S/P arthroscopy of right shoulder 04/17/2018   Dry eyes, bilateral 07/05/2017   Vaginal dryness, menopausal 07/05/2017   Other insomnia 07/05/2017   Essential hypertension 01/06/2016   Rosacea 01/06/2016   Heartburn 01/06/2016     REFERRING PROVIDER: Tollie Eth, NP  REFERRING DIAG: G57.02 (ICD-10-CM) - Piriformis syndrome of left side  THERAPY DIAG:  Pain in left hip  Muscle weakness (generalized)  Rationale for Evaluation and Treatment: Rehabilitation  ONSET DATE: December 2023  SUBJECTIVE:   SUBJECTIVE STATEMENT: No pain at rest. Reports benefit from DN and stretching.    EVAL: Pt states her sx's began approx 9 months ago.  Pt had no specific injury to L hip.  She noticed L hip pain after she had been pulling trash cans up and down her driveway  due to her husband's truck breaking down.  Pt has a long driveway and has 2 big hills.  Pt states her sx's have not worsened and also have not improved.    She has difficulty sleeping due to pain.  She has to take Ibuprofen to sleep better.  Pt has pain with walking the driveway.  Pt states the pain limits her activity, but she pushes through it.  Pt has pain with prolonged sitting.  Pt has pain at work.     PERTINENT HISTORY: HTN, R shoulder RCR x3 with the last one in 2021,   Pt has a 10 lb lifting restriction on R UE.    PAIN:  NPRS:   0/10 Location:  Lt hip   Type:    PRECAUTIONS: None  RED FLAGS: None   WEIGHT BEARING RESTRICTIONS: none on LE, 10# lifting restriction on R UE  FALLS:  Has patient fallen in last 6 months? No   OCCUPATION:  Pt is sitting/standing 50/50.  She works with [employer] at [employer].   PLOF: Independent.  Pt able to perform work and daily activities and ambulation without hip pain.  PATIENT GOALS: to be pain free, to learn exercises to resolve pain.  Improve sleeping   OBJECTIVE:   DIAGNOSTIC FINDINGS: N/A  PATIENT SURVEYS:  FOTO 59 with a goal of 27 at visit 11  COGNITION: Overall cognitive status: Within functional limits for tasks assessed      PALPATION: TTP:  inferolateral glute  LOWER EXTREMITY ROM:  Active ROM Right eval Left eval  Hip flexion 99 102  Hip extension    Hip abduction [facility]  Hip adduction    Hip internal rotation    Hip external rotation 30 31 with pain  Knee flexion    Knee extension    Ankle dorsiflexion    Ankle plantarflexion    Ankle inversion    Ankle eversion     (Blank rows = not tested)  LOWER EXTREMITY MMT:  MMT Right eval Left eval  Hip flexion 5/5 5/5  Hip extension    Hip abduction 5/5 4/5  Hip adduction    Hip internal rotation 4/5 4+/5 with pain  Hip external rotation 5/5 5/5  Knee flexion 5/5 seated 5/5 seated  Knee extension 5/5 5/5  Ankle dorsiflexion    Ankle plantarflexion    Ankle inversion    Ankle eversion     (Blank rows = not tested)  LOWER EXTREMITY SPECIAL TESTS:  FABER's test:  negative bilat   GAIT:  Pt ambulates with a normalized heel to toe gait pattern without limping.   TODAY'S TREATMENT:                                                                                                                                 06/05/23 Piriformis stretch 30sec x3bil Hamstring stretch with strap 30sec x3bil STM to L glutes/piriformis Bridge with 5"  hold 2x10 Sidleying clam with RTB  around thighs 5" hold 2x10 Prone hip extension with knee flexed x20ea Squats 2x10      05/30/23 Therapeutic exercise: Treadmill at 2. x 3.5 min for warm up Prone hip ext, 2 x 10 each LE Sidelying clams x 10 each LE; reverse clams x 10 each LE;  repeated on LLE 2nd set Bridges x 10 with scap/ab/glute set  Hooklying piriformis stretch (pulling towards opp shoulder) 20 sec, L/R/L  Bridge with 5 sec hold, x 10 Sidelying Lt hip abdct with heel leading to ceiling x 10 Windshield wiper stretch (hooklying ,wide feet with knees falling to one side) Manual therapy: (pt in prone position) MFR to Lt glute med/max TPR to Lt glute med with active hip  IR/ER ; STM to Lt hip upper lateral thigh to decrease fascial restriction and improve mobility  05/27/23 Supine piriformis stretch 3 x 20 second holds Supine piriformis stretch knee to contralateral shoulder holds 3 x 20 second holds TTP L piriformis  Manual: STM to L  pre and post dry needling for trigger point identification and muscular relaxation.  Trigger Point Dry-Needling  Treatment instructions: Expect mild to moderate muscle soreness. S/S of pneumothorax if dry needled over a lung field, and to seek immediate medical attention should they occur. Patient verbalized understanding of these instructions and education.  Patient Consent Given: Yes Education handout provided: Yes Muscles treated: L piriformis Electrical stimulation performed: No Parameters: N/A Treatment response/outcome: decrease in tissue tension possible decrease in symptoms Sidelying clam 2 x 15 Bridge 2 x 10 Prone hip extension 1 x 10 bilateral   05/23/23 Pt performed supine piriformis stretch 3x30 sec on L and 2x30 sec on R.  PT instructed pt in using a tennis ball on the wall to improve soft tissue mobility and TPR in glute/piriformis.  Pt used a tennis ball in a pillowcase on the wall on L glute with PT instruction.  Pt received a HEP handout and was educated in  correct form and appropriate frequency.    PATIENT EDUCATION:  Education details: exercise rationale and form Person educated: Patient Education method: Explanation, Demonstration, Tactile cues, Verbal cues, and Handouts Education comprehension: verbalized understanding, returned demonstration, verbal cues required, tactile cues required, and needs further education  HOME EXERCISE PROGRAM: Access Code: [facility] URL: [email or web address] Date: 05/23/2023 Prepared by: Aaron Edelman  Exercises - Supine Piriformis Stretch with Foot on Ground  - 2 x daily - 7 x weekly - 2-3 reps - 20-30 seconds hold - Standing Glute/piriformis Mobilization with Small Ball on Wall  - 1-2 x daily - 7 x weekly  05/27/23 - Clamshell (Mirrored)  - 1 x daily - 7 x weekly - 3 sets - 15 reps - Supine Bridge  - 1 x daily - 7 x weekly - 3 sets - 10 reps  ASSESSMENT:  CLINICAL IMPRESSION: Good tolerance for stretching program bilaterally. Spent time on soft tissue mobilization to decrease restrcitions in L piriformis area. This was followed by hip strengthening which pt tolerated well. She did note fatigue with lateral hip strengthening. Pt reported relief in L hip by end of session. Plan to try DN again next visit if appropriate.      OBJECTIVE IMPAIRMENTS: decreased activity tolerance, decreased mobility, decreased strength, increased fascial restrictions, and pain.   ACTIVITY LIMITATIONS: sitting and sleeping  PARTICIPATION LIMITATIONS: occupation  PERSONAL FACTORS:    REHAB POTENTIAL: Good  CLINICAL DECISION MAKING: Stable/uncomplicated  EVALUATION COMPLEXITY: Low   GOALS:  SHORT TERM GOALS: Target date: 06/13/2023  Pt will be independent and compliant with HEP for improved pain, strength, and function.  Baseline: Goal status: INITIAL  2.  Pt will report at least a 25% improvement in pain and sx's overall.    Baseline:  Goal status: INITIAL  3.  Pt will report reduced pain at  work. Baseline:  Goal status: INITIAL    LONG TERM GOALS: Target date: 07/04/2023  Pt will demo improved L hip abd strength to 5/5 MMT for improved pain with mobility.   Baseline:  Goal status: INITIAL  2.  Pt will be able to sleep without pain disturbing her.   Baseline:  Goal status: INITIAL  3.  Pt will report at least a 70% improvement in pain and sx's overall.  Baseline:  Goal status: INITIAL  4.  Pt will report she is able to ambulate her driveway without significant pain.  Baseline:  Goal status: INITIAL     PLAN:  PT FREQUENCY: 2x/week  PT DURATION: 6 weeks  PLANNED INTERVENTIONS: Therapeutic exercises, Therapeutic activity, Neuromuscular re-education, Gait training, Patient/Family education, Self Care, Joint mobilization, Stair training, Aquatic Therapy, Dry Needling, Electrical stimulation, Spinal mobilization, Cryotherapy, Moist heat, Taping, Ultrasound, Manual therapy, and Re-evaluation  PLAN FOR NEXT SESSION: progress HEP as tolerated.   STW to Lt glute/piriformis.    Riki Altes, PTA  06/05/23 9:15 AM [facility] [address] [place], [place], [address] Phone: [phone]   Fax:  [phone]

## 2023-06-13 ENCOUNTER — Ambulatory Visit (HOSPITAL_BASED_OUTPATIENT_CLINIC_OR_DEPARTMENT_OTHER): Payer: Commercial Managed Care - PPO | Admitting: Physical Therapy

## 2023-06-14 ENCOUNTER — Encounter (HOSPITAL_BASED_OUTPATIENT_CLINIC_OR_DEPARTMENT_OTHER): Payer: Self-pay | Admitting: Physical Therapy

## 2023-06-14 ENCOUNTER — Ambulatory Visit (HOSPITAL_BASED_OUTPATIENT_CLINIC_OR_DEPARTMENT_OTHER): Payer: Commercial Managed Care - PPO | Admitting: Physical Therapy

## 2023-06-14 DIAGNOSIS — M25552 Pain in left hip: Secondary | ICD-10-CM | POA: Diagnosis not present

## 2023-06-14 DIAGNOSIS — G5702 Lesion of sciatic nerve, left lower limb: Secondary | ICD-10-CM | POA: Diagnosis not present

## 2023-06-14 DIAGNOSIS — M6281 Muscle weakness (generalized): Secondary | ICD-10-CM

## 2023-06-14 NOTE — Therapy (Signed)
OUTPATIENT PHYSICAL THERAPY  TREATMENT   Patient Name: Monica Petty MRN: [id number] DOB:1966/09/01, 57 y.o., female Today's Date: 06/14/2023  END OF SESSION:  PT End of Session - 06/14/23 1513     Visit Number 5    Number of Visits 12    Date for PT Re-Evaluation 07/04/23    Authorization Type Gretta Began    PT Start Time 1513    PT Stop Time 1553    PT Time Calculation (min) 40 min    Activity Tolerance Patient tolerated treatment well    Behavior During Therapy [facility] for tasks assessed/performed              Past Medical History:  Diagnosis Date   Allergy    Complication of anesthesia    sob after 05-23-2019 sx for 1-2 days after got home   Diverticulosis of colon    GERD (gastroesophageal reflux disease)    hx of   History of colon polyps    History of concussion    1987  fell of horse--- no residual   Hypertension    Migraine    occasional   Rosacea    Rotator cuff tear    recurrent right side   Past Surgical History:  Procedure Laterality Date   COLONOSCOPY  06/06/2016   COSMETIC SURGERY  Abdominoplasty 5/23   LAPAROSCOPIC CHOLECYSTECTOMY  06-03-2008   dr Aleen Campi  [facility]   REDUCTION MAMMAPLASTY Bilateral 03-05-2002   dr barber  [facility]   SHOULDER ARTHROSCOPY WITH ROTATOR CUFF REPAIR Right 05/23/2019   Procedure: SHOULDER ARTHROSCOPY WITH revision rotator cuff repair and  debirdement;  Surgeon: Eugenia Mcalpine, MD;  Location: [facility];  Service: Orthopedics;  Laterality: Right;  interscalene block   SHOULDER ARTHROSCOPY WITH ROTATOR CUFF REPAIR Right 12/05/2019   Procedure: SHOULDER ARTHROSCOPY debridement revision rotator cuff repair, , with bone marrow aspiration from humerus;  Surgeon: Eugenia Mcalpine, MD;  Location: [facility];  Service: Orthopedics;  Laterality: Right;  exparel interscalene block   SHOULDER ARTHROSCOPY WITH ROTATOR CUFF REPAIR AND SUBACROMIAL DECOMPRESSION Right 04/17/2018   Procedure: Right shoulder  evaluation under anesthesia, scope, debridement, subacromial decompression, rotator cuff repair;  Surgeon: Eugenia Mcalpine, MD;  [facility];  Service: Orthopedics;  Laterality: Right;  120 mins General with Intra Scalene Block   VAGINAL HYSTERECTOMY  02-12-2002   dr Jennette Kettle  [facility]   WISDOM TOOTH EXTRACTION  teen   Patient Active Problem List   Diagnosis Date Noted   Routine physical examination 04/25/2023   BMI 31.0-31.9,adult 12/26/2022   Piriformis syndrome 12/26/2022   Senile purpura (HCC) 06/18/2022   Diverticulitis 04/05/2022   Migraine 04/05/2022   S/P arthroscopy of right shoulder 04/17/2018   Dry eyes, bilateral 07/05/2017   Vaginal dryness, menopausal 07/05/2017   Other insomnia 07/05/2017   Essential hypertension 01/06/2016   Rosacea 01/06/2016   Heartburn 01/06/2016     REFERRING PROVIDER: Tollie Eth, NP  REFERRING DIAG: G57.02 (ICD-10-CM) - Piriformis syndrome of left side  THERAPY DIAG:  Pain in left hip  Muscle weakness (generalized)  Rationale for Evaluation and Treatment: Rehabilitation  ONSET DATE: December 2023  SUBJECTIVE:   SUBJECTIVE STATEMENT: Patient states DN was really helpful. Doing stretches. Has not had any issues with symptoms down her leg. Past 2 days she has not been as compliant with exercises.    EVAL: Pt states her sx's began approx 9 months ago.  Pt had no specific injury to L  hip.  She noticed L hip pain after she had been pulling trash cans up and down her driveway due to her husband's truck breaking down.  Pt has a long driveway and has 2 big hills.  Pt states her sx's have not worsened and also have not improved.    She has difficulty sleeping due to pain.  She has to take Ibuprofen to sleep better.  Pt has pain with walking the driveway.  Pt states the pain limits her activity, but she pushes through it.  Pt has pain with prolonged sitting.  Pt has pain at work.     PERTINENT HISTORY: HTN, R shoulder RCR x3  with the last one in 2021,   Pt has a 10 lb lifting restriction on R UE.    PAIN:  NPRS:  0/10 Location:  Lt hip   Type:    PRECAUTIONS: None  RED FLAGS: None   WEIGHT BEARING RESTRICTIONS: none on LE, 10# lifting restriction on R UE  FALLS:  Has patient fallen in last 6 months? No   OCCUPATION:  Pt is sitting/standing 50/50.  She works with [employer] at [employer].   PLOF: Independent.  Pt able to perform work and daily activities and ambulation without hip pain.  PATIENT GOALS: to be pain free, to learn exercises to resolve pain.  Improve sleeping   OBJECTIVE:   DIAGNOSTIC FINDINGS: N/A  PATIENT SURVEYS:  FOTO 59 with a goal of 69 at visit 11  COGNITION: Overall cognitive status: Within functional limits for tasks assessed      PALPATION: TTP:  inferolateral glute  LOWER EXTREMITY ROM:  Active ROM Right eval Left eval  Hip flexion 99 102  Hip extension    Hip abduction [facility]  Hip adduction    Hip internal rotation    Hip external rotation 30 31 with pain  Knee flexion    Knee extension    Ankle dorsiflexion    Ankle plantarflexion    Ankle inversion    Ankle eversion     (Blank rows = not tested)  LOWER EXTREMITY MMT:  MMT Right eval Left eval  Hip flexion 5/5 5/5  Hip extension    Hip abduction 5/5 4/5  Hip adduction    Hip internal rotation 4/5 4+/5 with pain  Hip external rotation 5/5 5/5  Knee flexion 5/5 seated 5/5 seated  Knee extension 5/5 5/5  Ankle dorsiflexion    Ankle plantarflexion    Ankle inversion    Ankle eversion     (Blank rows = not tested)  LOWER EXTREMITY SPECIAL TESTS:  FABER's test:  negative bilat   GAIT:  Pt ambulates with a normalized heel to toe gait pattern without limping.   TODAY'S TREATMENT:                                                                                                                               06/14/23  Manual: STM to L glute max, piriformis, glute med/min pre and post  dry needling for trigger point identification and muscular relaxation. Trigger Point Dry-Needling  Treatment instructions: Expect mild to moderate muscle soreness. S/S of pneumothorax if dry needled over a lung field, and to seek immediate medical attention should they occur. Patient verbalized understanding of these instructions and education.  Patient Consent Given: Yes Education handout provided: Previously provided Muscles treated: L glute max Electrical stimulation performed: No Parameters: N/A Treatment response/outcome: decrease in tissue tension   Prone hip extension with knee flexed x20ea STS 2x 10  Lateral stepping 3 x  15 feet bilateral GTB at knees Lateral step down 6 inch 2 x 10    06/05/23 Piriformis stretch 30sec x3bil Hamstring stretch with strap 30sec x3bil STM to L glutes/piriformis Bridge with 5" hold 2x10 Sidleying clam with RTB around thighs 5" hold 2x10 Prone hip extension with knee flexed x20ea Squats 2x10      05/30/23 Therapeutic exercise: Treadmill at 2. x 3.5 min for warm up Prone hip ext, 2 x 10 each LE Sidelying clams x 10 each LE; reverse clams x 10 each LE;  repeated on LLE 2nd set Bridges x 10 with scap/ab/glute set  Hooklying piriformis stretch (pulling towards opp shoulder) 20 sec, L/R/L  Bridge with 5 sec hold, x 10 Sidelying Lt hip abdct with heel leading to ceiling x 10 Windshield wiper stretch (hooklying ,wide feet with knees falling to one side) Manual therapy: (pt in prone position) MFR to Lt glute med/max TPR to Lt glute med with active hip  IR/ER ; STM to Lt hip upper lateral thigh to decrease fascial restriction and improve mobility  05/27/23 Supine piriformis stretch 3 x 20 second holds Supine piriformis stretch knee to contralateral shoulder holds 3 x 20 second holds TTP L piriformis  Manual: STM to L  pre and post dry needling for trigger point identification and muscular relaxation.  Trigger Point Dry-Needling   Treatment instructions: Expect mild to moderate muscle soreness. S/S of pneumothorax if dry needled over a lung field, and to seek immediate medical attention should they occur. Patient verbalized understanding of these instructions and education.  Patient Consent Given: Yes Education handout provided: Yes Muscles treated: L piriformis Electrical stimulation performed: No Parameters: N/A Treatment response/outcome: decrease in tissue tension possible decrease in symptoms Sidelying clam 2 x 15 Bridge 2 x 10 Prone hip extension 1 x 10 bilateral   05/23/23 Pt performed supine piriformis stretch 3x30 sec on L and 2x30 sec on R.  PT instructed pt in using a tennis ball on the wall to improve soft tissue mobility and TPR in glute/piriformis.  Pt used a tennis ball in a pillowcase on the wall on L glute with PT instruction.  Pt received a HEP handout and was educated in correct form and appropriate frequency.    PATIENT EDUCATION:  Education details: exercise rationale and form Person educated: Patient Education method: Explanation, Demonstration, Tactile cues, Verbal cues, and Handouts Education comprehension: verbalized understanding, returned demonstration, verbal cues required, tactile cues required, and needs further education  HOME EXERCISE PROGRAM: Access Code: [facility] URL: [email or web address] Date: 05/23/2023 Prepared by: Aaron Edelman  Exercises - Supine Piriformis Stretch with Foot on Ground  - 2 x daily - 7 x weekly - 2-3 reps - 20-30 seconds hold - Standing Glute/piriformis Mobilization with Small Ball on Wall  - 1-2 x daily - 7 x weekly  05/27/23 - Clamshell (Mirrored)  - 1 x daily -  7 x weekly - 3 sets - 15 reps - Supine Bridge  - 1 x daily - 7 x weekly - 3 sets - 10 reps  06/14/23- Sit to Stand with Arms Crossed  - 1 x daily - 7 x weekly - 2 sets - 10 reps - Side Stepping with Resistance at Thighs  - 1 x daily - 7 x weekly - 4 sets - 10 reps - Lateral Step  Down  - 1 x daily - 7 x weekly - 2 sets - 10 reps  ASSESSMENT:  CLINICAL IMPRESSION: Began session with manual and DN to L glute with decrease in tissue tension and symptoms following. Additional glute and functional strengthening tolerated well.  Patient will continue to benefit from physical therapy in order to improve function and reduce impairment.      OBJECTIVE IMPAIRMENTS: decreased activity tolerance, decreased mobility, decreased strength, increased fascial restrictions, and pain.   ACTIVITY LIMITATIONS: sitting and sleeping  PARTICIPATION LIMITATIONS: occupation  PERSONAL FACTORS:    REHAB POTENTIAL: Good  CLINICAL DECISION MAKING: Stable/uncomplicated  EVALUATION COMPLEXITY: Low   GOALS:   SHORT TERM GOALS: Target date: 06/13/2023  Pt will be independent and compliant with HEP for improved pain, strength, and function.  Baseline: Goal status: INITIAL  2.  Pt will report at least a 25% improvement in pain and sx's overall.    Baseline:  Goal status: INITIAL  3.  Pt will report reduced pain at work. Baseline:  Goal status: INITIAL    LONG TERM GOALS: Target date: 07/04/2023  Pt will demo improved L hip abd strength to 5/5 MMT for improved pain with mobility.   Baseline:  Goal status: INITIAL  2.  Pt will be able to sleep without pain disturbing her.   Baseline:  Goal status: INITIAL  3.  Pt will report at least a 70% improvement in pain and sx's overall.  Baseline:  Goal status: INITIAL  4.  Pt will report she is able to ambulate her driveway without significant pain.  Baseline:  Goal status: INITIAL     PLAN:  PT FREQUENCY: 2x/week  PT DURATION: 6 weeks  PLANNED INTERVENTIONS: Therapeutic exercises, Therapeutic activity, Neuromuscular re-education, Gait training, Patient/Family education, Self Care, Joint mobilization, Stair training, Aquatic Therapy, Dry Needling, Electrical stimulation, Spinal mobilization, Cryotherapy, Moist heat,  Taping, Ultrasound, Manual therapy, and Re-evaluation  PLAN FOR NEXT SESSION: progress HEP as tolerated.   STW to Lt glute/piriformis.     Wyman Songster, PT 06/14/2023, 3:13 PM  [facility] [address] [place], [place], [address] Phone: [phone]   Fax:  [phone]

## 2023-06-15 ENCOUNTER — Ambulatory Visit: Payer: Commercial Managed Care - PPO

## 2023-06-15 NOTE — Progress Notes (Signed)
Orthotic eval   Patient was seen, measured for custom molded foot orthotics.  Patient will benefit from CFO's as they will help provide total contact to MLA's helping to better distribute body weight across BIL feet greater reducing plantar pressure and pain and to also encourage FF and RF alignment.  Patient was scanned items to be ordered and fit when in  [employer], CFo, CFm

## 2023-06-19 ENCOUNTER — Ambulatory Visit (HOSPITAL_BASED_OUTPATIENT_CLINIC_OR_DEPARTMENT_OTHER): Payer: Commercial Managed Care - PPO | Admitting: Physical Therapy

## 2023-06-19 ENCOUNTER — Encounter (HOSPITAL_BASED_OUTPATIENT_CLINIC_OR_DEPARTMENT_OTHER): Payer: Self-pay | Admitting: Physical Therapy

## 2023-06-19 DIAGNOSIS — M6281 Muscle weakness (generalized): Secondary | ICD-10-CM

## 2023-06-19 DIAGNOSIS — G5702 Lesion of sciatic nerve, left lower limb: Secondary | ICD-10-CM | POA: Diagnosis not present

## 2023-06-19 DIAGNOSIS — M25552 Pain in left hip: Secondary | ICD-10-CM

## 2023-06-19 NOTE — Therapy (Signed)
OUTPATIENT PHYSICAL THERAPY  TREATMENT   Patient Name: Monica Petty MRN: [id number] DOB:Jun 28, 1966, 57 y.o., female Today's Date: 06/19/2023  END OF SESSION:  PT End of Session - 06/19/23 1629     Visit Number 6    Number of Visits 12    Date for PT Re-Evaluation 07/04/23    Authorization Type Gretta Began    PT Start Time 1624    PT Stop Time 1705    PT Time Calculation (min) 41 min    Activity Tolerance Patient tolerated treatment well    Behavior During Therapy [facility] for tasks assessed/performed              Past Medical History:  Diagnosis Date   Allergy    Complication of anesthesia    sob after 05-23-2019 sx for 1-2 days after got home   Diverticulosis of colon    GERD (gastroesophageal reflux disease)    hx of   History of colon polyps    History of concussion    1987  fell of horse--- no residual   Hypertension    Migraine    occasional   Rosacea    Rotator cuff tear    recurrent right side   Past Surgical History:  Procedure Laterality Date   COLONOSCOPY  06/06/2016   COSMETIC SURGERY  Abdominoplasty 5/23   LAPAROSCOPIC CHOLECYSTECTOMY  06-03-2008   dr Aleen Campi  [facility]   REDUCTION MAMMAPLASTY Bilateral 03-05-2002   dr barber  [facility]   SHOULDER ARTHROSCOPY WITH ROTATOR CUFF REPAIR Right 05/23/2019   Procedure: SHOULDER ARTHROSCOPY WITH revision rotator cuff repair and  debirdement;  Surgeon: Eugenia Mcalpine, MD;  Location: [facility];  Service: Orthopedics;  Laterality: Right;  interscalene block   SHOULDER ARTHROSCOPY WITH ROTATOR CUFF REPAIR Right 12/05/2019   Procedure: SHOULDER ARTHROSCOPY debridement revision rotator cuff repair, , with bone marrow aspiration from humerus;  Surgeon: Eugenia Mcalpine, MD;  Location: [facility];  Service: Orthopedics;  Laterality: Right;  exparel interscalene block   SHOULDER ARTHROSCOPY WITH ROTATOR CUFF REPAIR AND SUBACROMIAL DECOMPRESSION Right 04/17/2018   Procedure: Right shoulder  evaluation under anesthesia, scope, debridement, subacromial decompression, rotator cuff repair;  Surgeon: Eugenia Mcalpine, MD;  Location: [facility];  Service: Orthopedics;  Laterality: Right;  120 mins General with Intra Scalene Block   VAGINAL HYSTERECTOMY  02-12-2002   dr Jennette Kettle  [facility]   WISDOM TOOTH EXTRACTION  teen   Patient Active Problem List   Diagnosis Date Noted   Routine physical examination 04/25/2023   BMI 31.0-31.9,adult 12/26/2022   Piriformis syndrome 12/26/2022   Senile purpura (HCC) 06/18/2022   Diverticulitis 04/05/2022   Migraine 04/05/2022   S/P arthroscopy of right shoulder 04/17/2018   Dry eyes, bilateral 07/05/2017   Vaginal dryness, menopausal 07/05/2017   Other insomnia 07/05/2017   Essential hypertension 01/06/2016   Rosacea 01/06/2016   Heartburn 01/06/2016     REFERRING PROVIDER: Tollie Eth, NP  REFERRING DIAG: G57.02 (ICD-10-CM) - Piriformis syndrome of left side  THERAPY DIAG:  Pain in left hip  Muscle weakness (generalized)  Rationale for Evaluation and Treatment: Rehabilitation  ONSET DATE: December 2023  SUBJECTIVE:   SUBJECTIVE STATEMENT: Patient states she is feeling much better.  She has seen a big improvement. Pt states the DN and exercises have helped.  Pt denies any adverse effects after prior Rx.  Pt denies pain currently.  Pt reports compliance with HEP.  Pt reports 80% improvement in pain and  sx's overall.  Pt reports reduced pain at work.  Pt reports she is able to sleep at night without pain disturbance.  Pt is able to lie on her side .       PERTINENT HISTORY: HTN, R shoulder RCR x3 with the last one in 2021,   Pt has a 10 lb lifting restriction on R UE.    PAIN:  NPRS:  0/10 Location:  Lt hip   Type:    PRECAUTIONS: None  RED FLAGS: None   WEIGHT BEARING RESTRICTIONS: none on LE, 10# lifting restriction on R UE  FALLS:  Has patient fallen in last 6 months? No   OCCUPATION:  Pt is  sitting/standing 50/50.  She works with [employer] at [employer].   PLOF: Independent.  Pt able to perform work and daily activities and ambulation without hip pain.  PATIENT GOALS: to be pain free, to learn exercises to resolve pain.  Improve sleeping   OBJECTIVE:   DIAGNOSTIC FINDINGS: N/A    TODAY'S TREATMENT:       Manual Therapy:  STM to L glute and piriforms and rolling to L glute and hip in R S/L'ing with pillow b/w knees.  Therapeutic Exercise:   Supine bridge  2x10 with 5 sec hold  S/L'ing clamshell with RTB 2x10 Lateral step down 4 inch 2x10 Lateral band walks with GTB at knees x 3 sets Squats 2x10 Supine piriformis stretch 3x30 sec L LE                                                                                                                             PATIENT EDUCATION:  Education details: exercise rationale and form, POC Person educated: Patient Education method: Explanation, Demonstration, Tactile cues, Verbal cues, and Handouts Education comprehension: verbalized understanding, returned demonstration, verbal cues required, tactile cues required, and needs further education  HOME EXERCISE PROGRAM: Access Code: [facility] URL: [email or web address] Date: 05/23/2023 Prepared by: Aaron Edelman  Exercises - Supine Piriformis Stretch with Foot on Ground  - 2 x daily - 7 x weekly - 2-3 reps - 20-30 seconds hold - Standing Glute/piriformis Mobilization with Small Ball on Wall  - 1-2 x daily - 7 x weekly  05/27/23 - Clamshell (Mirrored)  - 1 x daily - 7 x weekly - 3 sets - 15 reps - Supine Bridge  - 1 x daily - 7 x weekly - 3 sets - 10 reps  06/14/23- Sit to Stand with Arms Crossed  - 1 x daily - 7 x weekly - 2 sets - 10 reps - Side Stepping with Resistance at Thighs  - 1 x daily - 7 x weekly - 4 sets - 10 reps - Lateral Step Down  - 1 x daily - 7 x weekly - 2 sets - 10 reps  ASSESSMENT:  CLINICAL IMPRESSION: Pt is making excellent  progress.  Pt reports 80% improvement in pain and sx's overall.  She has reduced  pain at work and is able to sleep without pain disturbance.  Pt performed exercises well and required cuing and instruction in correct form with squatting.  She has met all STG's and LTG's #2,3.  Pt responded well to Rx reporting no pain and having no c/o's after Rx.      OBJECTIVE IMPAIRMENTS: decreased activity tolerance, decreased mobility, decreased strength, increased fascial restrictions, and pain.   ACTIVITY LIMITATIONS: sitting and sleeping  PARTICIPATION LIMITATIONS: occupation  PERSONAL FACTORS:    REHAB POTENTIAL: Good  CLINICAL DECISION MAKING: Stable/uncomplicated  EVALUATION COMPLEXITY: Low   GOALS:   SHORT TERM GOALS: Target date: 06/13/2023  Pt will be independent and compliant with HEP for improved pain, strength, and function.  Baseline: Goal status: GOAL MET  2.  Pt will report at least a 25% improvement in pain and sx's overall.    Baseline:  Goal status: GOAL MET  3.  Pt will report reduced pain at work. Baseline:  Goal status: GOAL MET    LONG TERM GOALS: Target date: 07/04/2023  Pt will demo improved L hip abd strength to 5/5 MMT for improved pain with mobility.   Baseline:  Goal status: INITIAL  2.  Pt will be able to sleep without pain disturbing her.   Baseline:  Goal status: GOAL MET  3.  Pt will report at least a 70% improvement in pain and sx's overall.  Baseline:  Goal status: GOAL MET  4.  Pt will report she is able to ambulate her driveway without significant pain.  Baseline:  Goal status: INITIAL     PLAN:  PT FREQUENCY: 2x/week  PT DURATION: 6 weeks  PLANNED INTERVENTIONS: Therapeutic exercises, Therapeutic activity, Neuromuscular re-education, Gait training, Patient/Family education, Self Care, Joint mobilization, Stair training, Aquatic Therapy, Dry Needling, Electrical stimulation, Spinal mobilization, Cryotherapy, Moist heat, Taping,  Ultrasound, Manual therapy, and Re-evaluation  PLAN FOR NEXT SESSION: Will assess strength next visit.  Review HEP.  Possible discharge next Rx.    Audie Clear III PT, DPT 06/19/23 10:59 PM

## 2023-06-22 ENCOUNTER — Ambulatory Visit (HOSPITAL_BASED_OUTPATIENT_CLINIC_OR_DEPARTMENT_OTHER): Payer: Commercial Managed Care - PPO | Admitting: Physical Therapy

## 2023-06-24 ENCOUNTER — Other Ambulatory Visit: Payer: Self-pay | Admitting: Podiatry

## 2023-06-24 ENCOUNTER — Other Ambulatory Visit (HOSPITAL_COMMUNITY): Payer: Self-pay

## 2023-06-26 ENCOUNTER — Other Ambulatory Visit: Payer: Self-pay

## 2023-06-26 ENCOUNTER — Ambulatory Visit (HOSPITAL_BASED_OUTPATIENT_CLINIC_OR_DEPARTMENT_OTHER): Payer: Commercial Managed Care - PPO | Attending: Nurse Practitioner | Admitting: Physical Therapy

## 2023-06-26 ENCOUNTER — Other Ambulatory Visit (HOSPITAL_COMMUNITY): Payer: Self-pay

## 2023-06-26 DIAGNOSIS — M25552 Pain in left hip: Secondary | ICD-10-CM | POA: Insufficient documentation

## 2023-06-26 DIAGNOSIS — M6281 Muscle weakness (generalized): Secondary | ICD-10-CM | POA: Insufficient documentation

## 2023-06-26 MED ORDER — MELOXICAM 15 MG PO TABS
15.0000 mg | ORAL_TABLET | Freq: Every day | ORAL | 0 refills | Status: DC | PRN
  Filled 2023-06-26: qty 30, 30d supply, fill #0

## 2023-06-26 MED ORDER — PREMARIN 0.625 MG PO TABS: 0.6250 mg | ORAL_TABLET | Freq: Every day | ORAL | 0 refills | Status: DC

## 2023-06-26 NOTE — Therapy (Signed)
OUTPATIENT PHYSICAL THERAPY  TREATMENT   Patient Name: Monica Petty MRN: [id number] DOB:[birth year], 57 y.o., female Today's Date: 06/27/2023  END OF SESSION:  PT End of Session - 06/26/23 1708     Visit Number 7    Number of Visits 7    Date for PT Re-Evaluation 07/04/23    Authorization Type [facility] AETNA    PT Start Time 1627    PT Stop Time 1705    PT Time Calculation (min) 38 min    Activity Tolerance Patient tolerated treatment well;No increased pain    Behavior During Therapy WFL for tasks assessed/performed               Past Medical History:  Diagnosis Date   Allergy    Complication of anesthesia    sob after 05-23-2019 sx for 1-2 days after got home   Diverticulosis of colon    GERD (gastroesophageal reflux disease)    hx of   History of colon polyps    History of concussion    1987  fell of horse--- no residual   Hypertension    Migraine    occasional   Rosacea    Rotator cuff tear    recurrent right side   Past Surgical History:  Procedure Laterality Date   COLONOSCOPY  06/06/2016   COSMETIC SURGERY  Abdominoplasty 5/23   LAPAROSCOPIC CHOLECYSTECTOMY  06-03-2008   dr Aleen Campi  [facility]   REDUCTION MAMMAPLASTY Bilateral 03-05-2002   dr barber  [facility]   SHOULDER ARTHROSCOPY WITH ROTATOR CUFF REPAIR Right 05/23/2019   Procedure: SHOULDER ARTHROSCOPY WITH revision rotator cuff repair and  debirdement;  Surgeon: Eugenia Mcalpine, MD;  Location: [facility];  Service: Orthopedics;  Laterality: Right;  interscalene block   SHOULDER ARTHROSCOPY WITH ROTATOR CUFF REPAIR Right 12/05/2019   Procedure: SHOULDER ARTHROSCOPY debridement revision rotator cuff repair, , with bone marrow aspiration from humerus;  Surgeon: Eugenia Mcalpine, MD;  Location: [facility];  Service: Orthopedics;  Laterality: Right;  exparel interscalene block   SHOULDER ARTHROSCOPY WITH ROTATOR CUFF REPAIR AND SUBACROMIAL DECOMPRESSION Right 04/17/2018    Procedure: Right shoulder evaluation under anesthesia, scope, debridement, subacromial decompression, rotator cuff repair;  Surgeon: Eugenia Mcalpine, MD;  Location: [facility];  Service: Orthopedics;  Laterality: Right;  120 mins General with Intra Scalene Block   VAGINAL HYSTERECTOMY  02-12-2002   dr Jennette Kettle  [facility]   WISDOM TOOTH EXTRACTION  teen   Patient Active Problem List   Diagnosis Date Noted   Routine physical examination 04/25/2023   BMI 31.0-31.9,adult 12/26/2022   Piriformis syndrome 12/26/2022   Senile purpura (HCC) 06/18/2022   Diverticulitis 04/05/2022   Migraine 04/05/2022   S/P arthroscopy of right shoulder 04/17/2018   Dry eyes, bilateral 07/05/2017   Vaginal dryness, menopausal 07/05/2017   Other insomnia 07/05/2017   Essential hypertension 01/06/2016   Rosacea 01/06/2016   Heartburn 01/06/2016     REFERRING PROVIDER: Tollie Eth, NP  REFERRING DIAG: G57.02 (ICD-10-CM) - Piriformis syndrome of left side  THERAPY DIAG:  Pain in left hip  Muscle weakness (generalized)  Rationale for Evaluation and Treatment: Rehabilitation  ONSET DATE: December 2023  SUBJECTIVE:   SUBJECTIVE STATEMENT: Patient denies pain currently and hasn't been having pain.  Pt has been performing her HEP though didn't perform as much while at the beach.  Pt reports she did a lot of walking at the beach without increased pain.  Pt denies any adverse effects after  prior Rx.  Pt reports 80% improvement in pain and sx's overall.  Pt reports reduced pain at work.  Pt is able to ambulate her driveway without significant pain.  Pt reports having slight difficulty and tenderness with ascending hills.      PERTINENT HISTORY: HTN, R shoulder RCR x3 with the last one in 2021,   Pt has a 10 lb lifting restriction on R UE.    PAIN:  NPRS:  0/10 Location:  Lt hip   Type:    PRECAUTIONS: None  RED FLAGS: None   WEIGHT BEARING RESTRICTIONS: none on LE, 10# lifting restriction  on R UE  FALLS:  Has patient fallen in last 6 months? No   OCCUPATION:  Pt is sitting/standing 50/50.  She works with [employer] at [employer].   PLOF: Independent.  Pt able to perform work and daily activities and ambulation without hip pain.  PATIENT GOALS: to be pain free, to learn exercises to resolve pain.  Improve sleeping   OBJECTIVE:   DIAGNOSTIC FINDINGS: N/A    TODAY'S TREATMENT:       L Hip ER AROM:  Initial/Current:  31 deg with pain / 36 deg without pain  LOWER EXTREMITY MMT:   MMT Right eval Left eval Left 10/7  Hip flexion 5/5 5/5   Hip extension       Hip abduction 5/5 4/5 4+/5  Hip adduction       Hip internal rotation 4/5 4+/5 with pain 4+/5 with slight pain  Hip external rotation 5/5 5/5   Knee flexion 5/5 seated 5/5 seated   Knee extension 5/5 5/5   Ankle dorsiflexion       Ankle plantarflexion       Ankle inversion       Ankle eversion        (Blank rows = not tested)  Therapeutic Exercise:   Supine bridge  2x10 with 5 sec hold  S/L'ing clamshell with RTB 2x10 Lateral step down 4 inch 2x10 Lateral band walks with GTB at knees x 3 sets  Reviewed HEP and educated pt in correct form and appropriate frequency.  FOTO:  Initial/currnt:  59/72 with a goal of 71.                                                                                                                             PATIENT EDUCATION:  Education details: exercise rationale and form, POC Person educated: Patient Education method: Explanation, Demonstration, Tactile cues, Verbal cues, and Handouts Education comprehension: verbalized understanding, returned demonstration, verbal cues required, tactile cues required, and needs further education  HOME EXERCISE PROGRAM: Access Code: [facility] URL: [email or web address] Date: 05/23/2023 Prepared by: Aaron Edelman  Exercises - Supine Piriformis Stretch with Foot on Ground  - 2 x daily - 7 x weekly - 2-3 reps  - 20-30 seconds hold - Standing Glute/piriformis Mobilization with Small Ball on Wall  - 1-2 x daily - 7  x weekly  05/27/23 - Clamshell (Mirrored)  - 1 x daily - 7 x weekly - 3 sets - 15 reps - Supine Bridge  - 1 x daily - 7 x weekly - 3 sets - 10 reps  06/14/23- Sit to Stand with Arms Crossed  - 1 x daily - 7 x weekly - 2 sets - 10 reps - Side Stepping with Resistance at Thighs  - 1 x daily - 7 x weekly - 4 sets - 10 reps - Lateral Step Down  - 1 x daily - 7 x weekly - 2 sets - 10 reps  ASSESSMENT:  CLINICAL IMPRESSION: Pt has responded well to PT and made excellent progress.  Pt denies pain currently and hasn't been having pain.  She reports 80% improvement in pain and sx's overall.  Pt is able to ambulate her driveway without significant pain.  She has minimal weakness in L hip abduction though has improved to 4+/5 MMT.  She demonstrates improved self perceived disability as evidenced by FOTO score improvement.  She met her FOTO goal.  PT reviewed HEP and educated pt concerning home exercises and appropriate frequency with resisted exercises.  Pt is independent with HEP.  Pt has met all of her goals except partially meeting LTG #1.  She is ready for discharge.     OBJECTIVE IMPAIRMENTS: decreased activity tolerance, decreased mobility, decreased strength, increased fascial restrictions, and pain.   ACTIVITY LIMITATIONS: sitting and sleeping  PARTICIPATION LIMITATIONS: occupation  PERSONAL FACTORS:    REHAB POTENTIAL: Good  CLINICAL DECISION MAKING: Stable/uncomplicated  EVALUATION COMPLEXITY: Low   GOALS:   SHORT TERM GOALS: Target date: 06/13/2023  Pt will be independent and compliant with HEP for improved pain, strength, and function.  Baseline: Goal status: GOAL MET  2.  Pt will report at least a 25% improvement in pain and sx's overall.    Baseline:  Goal status: GOAL MET  3.  Pt will report reduced pain at work. Baseline:  Goal status: GOAL MET    LONG TERM  GOALS: Target date: 07/04/2023  Pt will demo improved L hip abd strength to 5/5 MMT for improved pain with mobility.   Baseline:  Goal status:  50% MET  2.  Pt will be able to sleep without pain disturbing her.   Baseline:  Goal status: GOAL MET  3.  Pt will report at least a 70% improvement in pain and sx's overall.  Baseline:  Goal status: GOAL MET  4.  Pt will report she is able to ambulate her driveway without significant pain.  Baseline:  Goal status:  GOAL MET     PLAN:  PT FREQUENCY: 2x/week  PT DURATION: 6 weeks  PLANNED INTERVENTIONS: Therapeutic exercises, Therapeutic activity, Neuromuscular re-education, Gait training, Patient/Family education, Self Care, Joint mobilization, Stair training, Aquatic Therapy, Dry Needling, Electrical stimulation, Spinal mobilization, Cryotherapy, Moist heat, Taping, Ultrasound, Manual therapy, and Re-evaluation  PLAN FOR NEXT SESSION:  Pt to be discharged from skilled PT services due to meeting goals except partially meeting LTG #1.  Pt will cont with HEP.  She is agreeable with discharge.   PHYSICAL THERAPY DISCHARGE SUMMARY  Visits from Start of Care: 7  Current functional level related to goals / functional outcomes: See above   Remaining deficits:    Education / Equipment: HEP     Audie Clear III PT, DPT 06/27/23 11:47 PM

## 2023-06-27 ENCOUNTER — Encounter (HOSPITAL_BASED_OUTPATIENT_CLINIC_OR_DEPARTMENT_OTHER): Payer: Self-pay | Admitting: Physical Therapy

## 2023-06-27 ENCOUNTER — Other Ambulatory Visit: Payer: Self-pay

## 2023-06-29 ENCOUNTER — Ambulatory Visit (HOSPITAL_BASED_OUTPATIENT_CLINIC_OR_DEPARTMENT_OTHER): Payer: Commercial Managed Care - PPO | Admitting: Physical Therapy

## 2023-07-01 ENCOUNTER — Encounter (HOSPITAL_BASED_OUTPATIENT_CLINIC_OR_DEPARTMENT_OTHER): Payer: Commercial Managed Care - PPO | Admitting: Physical Therapy

## 2023-07-10 ENCOUNTER — Other Ambulatory Visit (HOSPITAL_COMMUNITY): Payer: Self-pay

## 2023-07-10 ENCOUNTER — Other Ambulatory Visit: Payer: Self-pay

## 2023-07-10 DIAGNOSIS — Z1231 Encounter for screening mammogram for malignant neoplasm of breast: Secondary | ICD-10-CM | POA: Diagnosis not present

## 2023-07-10 DIAGNOSIS — R319 Hematuria, unspecified: Secondary | ICD-10-CM | POA: Diagnosis not present

## 2023-07-10 DIAGNOSIS — Z6832 Body mass index (BMI) 32.0-32.9, adult: Secondary | ICD-10-CM | POA: Diagnosis not present

## 2023-07-10 DIAGNOSIS — Z01419 Encounter for gynecological examination (general) (routine) without abnormal findings: Secondary | ICD-10-CM | POA: Diagnosis not present

## 2023-07-10 MED ORDER — PREMARIN 0.625 MG PO TABS
0.6250 mg | ORAL_TABLET | Freq: Every day | ORAL | 3 refills | Status: AC
  Filled 2023-07-10: qty 90, 90d supply, fill #0
  Filled 2023-10-24: qty 90, 90d supply, fill #1
  Filled 2024-01-18: qty 90, 90d supply, fill #2
  Filled 2024-04-22: qty 90, 90d supply, fill #3

## 2023-07-10 MED ORDER — ESTRADIOL 0.1 MG/GM VA CREA
TOPICAL_CREAM | VAGINAL | 3 refills | Status: AC
  Filled 2023-07-10: qty 42.5, 90d supply, fill #0
  Filled 2023-10-24: qty 42.5, 90d supply, fill #1
  Filled 2024-01-18: qty 42.5, 90d supply, fill #2

## 2023-07-11 ENCOUNTER — Other Ambulatory Visit: Payer: Self-pay

## 2023-08-03 ENCOUNTER — Encounter: Payer: Self-pay | Admitting: Gastroenterology

## 2023-08-10 ENCOUNTER — Ambulatory Visit (INDEPENDENT_AMBULATORY_CARE_PROVIDER_SITE_OTHER): Payer: Commercial Managed Care - PPO

## 2023-08-10 DIAGNOSIS — M778 Other enthesopathies, not elsewhere classified: Secondary | ICD-10-CM | POA: Diagnosis not present

## 2023-08-10 DIAGNOSIS — M19071 Primary osteoarthritis, right ankle and foot: Secondary | ICD-10-CM | POA: Diagnosis not present

## 2023-08-10 NOTE — Progress Notes (Signed)
Patient presents today to pick up custom molded foot orthotics, diagnosed with Capsulitis by Dr. Ardelle Anton.   Orthotics were dispensed and fit was satisfactory. Reviewed instructions for break-in and wear. Written instructions given to patient.  Patient will follow up as needed. Addison Bailey Cped, CFo, CFm

## 2023-09-18 ENCOUNTER — Other Ambulatory Visit: Payer: Self-pay

## 2023-09-18 ENCOUNTER — Other Ambulatory Visit: Payer: Self-pay | Admitting: Nurse Practitioner

## 2023-09-18 ENCOUNTER — Other Ambulatory Visit (HOSPITAL_COMMUNITY): Payer: Self-pay

## 2023-09-18 ENCOUNTER — Telehealth: Payer: Self-pay | Admitting: Podiatry

## 2023-09-18 DIAGNOSIS — L719 Rosacea, unspecified: Secondary | ICD-10-CM

## 2023-09-18 DIAGNOSIS — M19071 Primary osteoarthritis, right ankle and foot: Secondary | ICD-10-CM

## 2023-09-18 MED ORDER — TRETINOIN 0.025 % EX CREA
TOPICAL_CREAM | CUTANEOUS | 1 refills | Status: DC
  Filled 2023-09-18 – 2023-10-06 (×3): qty 45, 30d supply, fill #0
  Filled 2023-12-07: qty 45, 30d supply, fill #1

## 2023-09-18 NOTE — Telephone Encounter (Signed)
Notified pt orthotics have been ordered and she would get a call when they come in. She said thank you so much

## 2023-09-18 NOTE — Telephone Encounter (Signed)
I put in charges.

## 2023-09-18 NOTE — Telephone Encounter (Signed)
Pt called and states she has left multiple messages but has not gotten a call back. She is wanting to order another pair of orthotics like her last ones. She is aware if ordered within 6 months they are half price. I told pt I would send the message to have them ordered and she would get a call when they come in.

## 2023-09-19 ENCOUNTER — Encounter: Payer: Self-pay | Admitting: Pharmacist

## 2023-09-19 ENCOUNTER — Other Ambulatory Visit: Payer: Self-pay

## 2023-09-19 ENCOUNTER — Other Ambulatory Visit (HOSPITAL_COMMUNITY): Payer: Self-pay

## 2023-09-21 ENCOUNTER — Other Ambulatory Visit (HOSPITAL_COMMUNITY): Payer: Self-pay

## 2023-09-21 ENCOUNTER — Other Ambulatory Visit: Payer: Self-pay

## 2023-09-22 ENCOUNTER — Other Ambulatory Visit (HOSPITAL_COMMUNITY): Payer: Self-pay

## 2023-09-25 ENCOUNTER — Other Ambulatory Visit: Payer: Self-pay

## 2023-09-25 ENCOUNTER — Encounter: Payer: Self-pay | Admitting: Gastroenterology

## 2023-10-03 ENCOUNTER — Telehealth: Payer: Self-pay

## 2023-10-03 NOTE — Telephone Encounter (Signed)
 SPOKE WITH PT THAT SHE can come pick up her 2nd pair of orthotics since she ordered 2nd pair it is half off so it is $271

## 2023-10-05 ENCOUNTER — Telehealth: Payer: Self-pay | Admitting: Podiatry

## 2023-10-05 NOTE — Telephone Encounter (Signed)
Pt called and wanted to give permission for her husband to pick up her orthotics and pay for them today as he was already coming in today.

## 2023-10-06 ENCOUNTER — Other Ambulatory Visit: Payer: Self-pay

## 2023-10-06 ENCOUNTER — Other Ambulatory Visit (HOSPITAL_COMMUNITY): Payer: Self-pay

## 2023-10-16 ENCOUNTER — Other Ambulatory Visit: Payer: Self-pay

## 2023-10-16 ENCOUNTER — Encounter: Payer: Self-pay | Admitting: Gastroenterology

## 2023-10-16 ENCOUNTER — Ambulatory Visit (AMBULATORY_SURGERY_CENTER): Payer: Commercial Managed Care - PPO

## 2023-10-16 ENCOUNTER — Other Ambulatory Visit (HOSPITAL_COMMUNITY): Payer: Self-pay

## 2023-10-16 VITALS — Ht 66.0 in | Wt 190.0 lb

## 2023-10-16 DIAGNOSIS — Z860100 Personal history of colon polyps, unspecified: Secondary | ICD-10-CM

## 2023-10-16 MED ORDER — NA SULFATE-K SULFATE-MG SULF 17.5-3.13-1.6 GM/177ML PO SOLN
1.0000 | Freq: Once | ORAL | 0 refills | Status: AC
Start: 2023-10-16 — End: 2023-10-18
  Filled 2023-10-16: qty 354, 1d supply, fill #0

## 2023-10-16 NOTE — Progress Notes (Signed)
Denies allergies to eggs or soy products. Denies complication of anesthesia or sedation. Denies use of weight loss medication. Denies use of O2.   Emmi instructions given for colonoscopy.

## 2023-10-18 ENCOUNTER — Telehealth: Payer: Self-pay | Admitting: Gastroenterology

## 2023-10-18 NOTE — Telephone Encounter (Signed)
Did the patient reschedule her procedure or did [facility] change her prescription? Please  Thank you Bre

## 2023-10-18 NOTE — Telephone Encounter (Signed)
Patient needs updated prep instructions

## 2023-10-24 ENCOUNTER — Other Ambulatory Visit (HOSPITAL_COMMUNITY): Payer: Self-pay

## 2023-10-30 ENCOUNTER — Encounter: Payer: Commercial Managed Care - PPO | Admitting: Gastroenterology

## 2023-11-21 DIAGNOSIS — L570 Actinic keratosis: Secondary | ICD-10-CM | POA: Diagnosis not present

## 2023-11-21 DIAGNOSIS — C4492 Squamous cell carcinoma of skin, unspecified: Secondary | ICD-10-CM | POA: Insufficient documentation

## 2023-11-21 DIAGNOSIS — C44529 Squamous cell carcinoma of skin of other part of trunk: Secondary | ICD-10-CM | POA: Diagnosis not present

## 2023-11-21 DIAGNOSIS — D692 Other nonthrombocytopenic purpura: Secondary | ICD-10-CM | POA: Diagnosis not present

## 2023-11-21 DIAGNOSIS — D485 Neoplasm of uncertain behavior of skin: Secondary | ICD-10-CM | POA: Diagnosis not present

## 2023-11-21 DIAGNOSIS — D3611 Benign neoplasm of peripheral nerves and autonomic nervous system of face, head, and neck: Secondary | ICD-10-CM | POA: Diagnosis not present

## 2023-11-28 NOTE — Telephone Encounter (Signed)
 Revised instructions sent to patient via MyChart as well as mailed to the patient;

## 2023-12-04 ENCOUNTER — Encounter: Payer: Self-pay | Admitting: Gastroenterology

## 2023-12-04 ENCOUNTER — Ambulatory Visit: Payer: Commercial Managed Care - PPO | Admitting: Gastroenterology

## 2023-12-04 VITALS — BP 116/67 | HR 64 | Temp 97.7°F | Resp 15 | Ht 66.0 in | Wt 190.0 lb

## 2023-12-04 DIAGNOSIS — Z860100 Personal history of colon polyps, unspecified: Secondary | ICD-10-CM | POA: Diagnosis not present

## 2023-12-04 DIAGNOSIS — D122 Benign neoplasm of ascending colon: Secondary | ICD-10-CM

## 2023-12-04 DIAGNOSIS — I10 Essential (primary) hypertension: Secondary | ICD-10-CM | POA: Diagnosis not present

## 2023-12-04 DIAGNOSIS — K644 Residual hemorrhoidal skin tags: Secondary | ICD-10-CM | POA: Diagnosis not present

## 2023-12-04 DIAGNOSIS — K648 Other hemorrhoids: Secondary | ICD-10-CM

## 2023-12-04 DIAGNOSIS — Z860101 Personal history of adenomatous and serrated colon polyps: Secondary | ICD-10-CM | POA: Diagnosis not present

## 2023-12-04 DIAGNOSIS — K5730 Diverticulosis of large intestine without perforation or abscess without bleeding: Secondary | ICD-10-CM

## 2023-12-04 DIAGNOSIS — Z1211 Encounter for screening for malignant neoplasm of colon: Secondary | ICD-10-CM

## 2023-12-04 MED ORDER — SODIUM CHLORIDE 0.9 % IV SOLN: 500.0000 mL | INTRAVENOUS | Status: DC

## 2023-12-04 NOTE — Progress Notes (Unsigned)
 Pt's states no medical or surgical changes since previsit or office visit.

## 2023-12-04 NOTE — Op Note (Signed)
 [facility] Patient Name: Monica Petty Procedure Date: 12/04/2023 2:39 PM MRN: [id number] Endoscopist: Napoleon Form , MD, [id number] Age: 58 Referring MD:  Date of Birth: 06-28-1966 Gender: Female Account #: [id number] Procedure:                Colonoscopy Indications:              High risk colon cancer surveillance: Personal                            history of adenoma less than 10 mm in size Medicines:                Monitored Anesthesia Care Procedure:                Pre-Anesthesia Assessment:                           - Prior to the procedure, a History and Physical                            was performed, and patient medications and                            allergies were reviewed. The patient's tolerance of                            previous anesthesia was also reviewed. The risks                            and benefits of the procedure and the sedation                            options and risks were discussed with the patient.                            All questions were answered, and informed consent                            was obtained. Prior Anticoagulants: The patient has                            taken no anticoagulant or antiplatelet agents. ASA                            Grade Assessment: II - A patient with mild systemic                            disease. After reviewing the risks and benefits,                            the patient was deemed in satisfactory condition to                            undergo the procedure.  After obtaining informed consent, the colonoscope                            was passed under direct vision. Throughout the                            procedure, the patient's blood pressure, pulse, and                            oxygen saturations were monitored continuously. The                            [id number]-HQ190L Colonoscope [id number] was introduced                            through the  anus and advanced to the the cecum,                            identified by appendiceal orifice and ileocecal                            valve. The colonoscopy was performed without                            difficulty. The patient tolerated the procedure                            well. The quality of the bowel preparation was                            good. The ileocecal valve, appendiceal orifice, and                            rectum were photographed. Scope In: 2:54:03 PM Scope Out: 3:05:43 PM Scope Withdrawal Time: 0 hours 8 minutes 19 seconds  Total Procedure Duration: 0 hours 11 minutes 40 seconds  Findings:                 The perianal and digital rectal examinations were                            normal.                           A 4 mm polyp was found in the ascending colon. The                            polyp was sessile. The polyp was removed with a                            cold snare. Resection and retrieval were complete.                           Multiple medium-mouthed and small-mouthed  diverticula were found in the sigmoid colon and                            descending colon. Peri-diverticular erythema was                            seen.                           Non-bleeding external and internal hemorrhoids were                            found during retroflexion. The hemorrhoids were                            medium-sized. Complications:            No immediate complications. Estimated Blood Loss:     Estimated blood loss was minimal. Impression:               - One 4 mm polyp in the ascending colon, removed                            with a cold snare. Resected and retrieved.                           - Moderate diverticulosis in the sigmoid colon and                            in the descending colon. Peri-diverticular erythema                            was seen.                           - Non-bleeding external and internal  hemorrhoids. Recommendation:           - Patient has a contact number available for                            emergencies. The signs and symptoms of potential                            delayed complications were discussed with the                            patient. Return to normal activities tomorrow.                            Written discharge instructions were provided to the                            patient.                           - Resume previous diet.                           -  Continue present medications.                           - Await pathology results.                           - Repeat colonoscopy in 5-10 years for surveillance                            based on pathology results. Napoleon Form, MD 12/04/2023 3:11:32 PM This report has been signed electronically.

## 2023-12-04 NOTE — Progress Notes (Unsigned)
 Called to room to assist during endoscopic procedure.  Patient ID and intended procedure confirmed with present staff. Received instructions for my participation in the procedure from the performing physician.

## 2023-12-04 NOTE — Progress Notes (Unsigned)
 [facility] History and Physical   Primary Care Physician:  Tollie Eth, NP   Reason for Procedure:  History of adenomatous colon polyps  Plan:    Surveillance colonoscopy with possible interventions as needed     HPI: Monica Petty is a very pleasant 58 y.o. female here for surveillance colonoscopy. Denies any nausea, vomiting, abdominal pain, melena or bright red blood per rectum  The risks and benefits as well as alternatives of endoscopic procedure(s) have been discussed and reviewed. All questions answered. The patient agrees to proceed.    Past Medical History:  Diagnosis Date   Allergy    Arthritis    Complication of anesthesia    sob after 05-23-2019 sx for 1-2 days after got home   Diverticulosis of colon    GERD (gastroesophageal reflux disease)    hx of   History of colon polyps    History of concussion    1987  fell of horse--- no residual   Hypertension    Migraine    occasional   Rosacea    Rotator cuff tear    recurrent right side    Past Surgical History:  Procedure Laterality Date   COLONOSCOPY  06/06/2016   COSMETIC SURGERY  Abdominoplasty 5/23   LAPAROSCOPIC CHOLECYSTECTOMY  06-03-2008   dr Aleen Campi  [facility]   REDUCTION MAMMAPLASTY Bilateral 03-05-2002   dr barber  [facility]   SHOULDER ARTHROSCOPY WITH ROTATOR CUFF REPAIR Right 05/23/2019   Procedure: SHOULDER ARTHROSCOPY WITH revision rotator cuff repair and  debirdement;  Surgeon: Eugenia Mcalpine, MD;  Location: [facility];  Service: Orthopedics;  Laterality: Right;  interscalene block   SHOULDER ARTHROSCOPY WITH ROTATOR CUFF REPAIR Right 12/05/2019   Procedure: SHOULDER ARTHROSCOPY debridement revision rotator cuff repair, , with bone marrow aspiration from humerus;  Surgeon: Eugenia Mcalpine, MD;  Location: [facility];  Service: Orthopedics;  Laterality: Right;  exparel interscalene block   SHOULDER ARTHROSCOPY WITH ROTATOR CUFF REPAIR AND SUBACROMIAL  DECOMPRESSION Right 04/17/2018   Procedure: Right shoulder evaluation under anesthesia, scope, debridement, subacromial decompression, rotator cuff repair;  Surgeon: Eugenia Mcalpine, MD;  Location: [facility];  Service: Orthopedics;  Laterality: Right;  120 mins General with Intra Scalene Block   VAGINAL HYSTERECTOMY  02-12-2002   dr Jennette Kettle  [facility]   WISDOM TOOTH EXTRACTION  teen    Prior to Admission medications   Medication Sig Start Date End Date Taking? Authorizing Provider  esomeprazole (NEXIUM) 20 MG capsule Take 20 mg by mouth daily at 12 noon.   Yes [provider]  estrogens, conjugated, (PREMARIN) 0.625 MG tablet Take 1 tablet (0.625 mg total) by mouth daily. 07/10/23  Yes   hydrochlorothiazide (HYDRODIURIL) 25 MG tablet Take 1 tablet (25 mg total) by mouth daily. 12/15/22  Yes Early, Sung Amabile, NP  lisinopril (ZESTRIL) 5 MG tablet Take 1 tablet (5 mg total) by mouth daily. 12/15/22  Yes Early, Sung Amabile, NP  Multiple Vitamins-Minerals (MULTIVITAMIN ADULT PO) Take 1 capsule by mouth daily.    Yes [provider]  estradiol (ESTRACE) 0.1 MG/GM vaginal cream INSERT 1 GRAM VAGINALLY ONCE A WEEK 07/10/23     tretinoin (RETIN-A) 0.025 % cream Apply to affected area once a day as needed for rash breakout. 09/18/23   Tollie Eth, NP    Current Outpatient Medications  Medication Sig Dispense Refill   esomeprazole (NEXIUM) 20 MG capsule Take 20 mg by mouth daily at 12 noon.  estrogens, conjugated, (PREMARIN) 0.625 MG tablet Take 1 tablet (0.625 mg total) by mouth daily. 90 tablet 3   hydrochlorothiazide (HYDRODIURIL) 25 MG tablet Take 1 tablet (25 mg total) by mouth daily. 90 tablet 3   lisinopril (ZESTRIL) 5 MG tablet Take 1 tablet (5 mg total) by mouth daily. 90 tablet 3   Multiple Vitamins-Minerals (MULTIVITAMIN ADULT PO) Take 1 capsule by mouth daily.      estradiol (ESTRACE) 0.1 MG/GM vaginal cream INSERT 1 GRAM VAGINALLY ONCE A WEEK 42.5 g 3   tretinoin  (RETIN-A) 0.025 % cream Apply to affected area once a day as needed for rash breakout. 45 g 1   Current Facility-Administered Medications  Medication Dose Route Frequency Provider Last Rate Last Admin   0.9 %  sodium chloride infusion  500 mL Intravenous Continuous Briannia Laba, Eleonore Chiquito, MD        Allergies as of 12/04/2023 - Review Complete 10/16/2023  Allergen Reaction Noted   Adhesive [tape] Other (See Comments) 11/28/2019   Codeine Other (See Comments) 05/24/2016   Penicillins Rash 12/02/2014   Sulfa antibiotics Other (See Comments) 12/02/2014    Family History  Problem Relation Age of Onset   Colon polyps Mother    Hypertension Mother    Cancer Father    Hypertension Father    Diabetes Brother    Breast cancer Paternal Aunt    Breast cancer Other    Colon cancer Neg Hx    Esophageal cancer Neg Hx    Rectal cancer Neg Hx    Stomach cancer Neg Hx     Social History   Socioeconomic History   Marital status: Married    Spouse name: Not on file   Number of children: Not on file   Years of education: Not on file   Highest education level: Not on file  Occupational History   Not on file  Tobacco Use   Smoking status: Never   Smokeless tobacco: Never  Vaping Use   Vaping status: Never Used  Substance and Sexual Activity   Alcohol use: Yes    Alcohol/week: 6.0 standard drinks of alcohol    Types: 2 Glasses of wine, 4 Shots of liquor per week   Drug use: No   Sexual activity: Yes    Birth control/protection: Surgical, Post-menopausal  Other Topics Concern   Not on file  Social History Narrative   Not on file   Social Drivers of Health   Financial Resource Strain: Not on file  Food Insecurity: No Food Insecurity (01/14/2019)   Received from [facility] visits prior to 11/19/2022., [facility] visits prior to 11/19/2022.   Hunger Vital Sign    Worried About Running Out of Food in the Last Year: Never true    Ran Out of Food  in the Last Year: Never true  Transportation Needs: No Transportation Needs (01/14/2019)   [facility] prior to 11/19/2022., [facility] visits prior to 11/19/2022.   PRAPARE - Administrator, Civil Service (Medical): No    Lack of Transportation (Non-Medical): No  Physical Activity: Not on file  Stress: Not on file  Social Connections: Not on file  Intimate Partner Violence: Not At Risk (01/14/2019)   Received from [facility] visits prior to 11/19/2022., [facility] visits prior to 11/19/2022.   Humiliation, Afraid, Rape, and Kick questionnaire    Fear  of Current or Ex-Partner: No    Emotionally Abused: No    Physically Abused: No    Sexually Abused: No    Review of Systems:  All other review of systems negative except as mentioned in the HPI.  Physical Exam: Vital signs in last 24 hours: BP 109/61   Pulse 70   Temp 97.7 F (36.5 C) (Temporal)   Resp 13   Ht 5\' 6"  (1.676 m)   Wt 190 lb (86.2 kg)   SpO2 100%   BMI 30.67 kg/m  General:   Alert, NAD Lungs:  Clear .   Heart:  Regular rate and rhythm Abdomen:  Soft, nontender and nondistended. Neuro/Psych:  Alert and cooperative. Normal mood and affect. A and O x 3  Reviewed labs, radiology imaging, old records and pertinent past GI work up  Patient is appropriate for planned procedure(s) and anesthesia in an ambulatory setting   K. Scherry Ran , MD [phone]

## 2023-12-04 NOTE — Patient Instructions (Signed)

## 2023-12-04 NOTE — Progress Notes (Unsigned)
 To pacu, VSS. Report to [email or web address]

## 2023-12-05 ENCOUNTER — Telehealth: Payer: Self-pay

## 2023-12-05 NOTE — Telephone Encounter (Signed)
 No answer after follow up call. Voice message left.

## 2023-12-07 ENCOUNTER — Other Ambulatory Visit (HOSPITAL_COMMUNITY): Payer: Self-pay

## 2023-12-07 LAB — SURGICAL PATHOLOGY

## 2023-12-15 DIAGNOSIS — I10 Essential (primary) hypertension: Secondary | ICD-10-CM | POA: Diagnosis not present

## 2023-12-15 DIAGNOSIS — K219 Gastro-esophageal reflux disease without esophagitis: Secondary | ICD-10-CM | POA: Diagnosis not present

## 2023-12-15 DIAGNOSIS — C44529 Squamous cell carcinoma of skin of other part of trunk: Secondary | ICD-10-CM | POA: Diagnosis not present

## 2023-12-19 DIAGNOSIS — H52223 Regular astigmatism, bilateral: Secondary | ICD-10-CM | POA: Diagnosis not present

## 2023-12-19 DIAGNOSIS — D3132 Benign neoplasm of left choroid: Secondary | ICD-10-CM | POA: Diagnosis not present

## 2023-12-19 DIAGNOSIS — H04123 Dry eye syndrome of bilateral lacrimal glands: Secondary | ICD-10-CM | POA: Diagnosis not present

## 2023-12-20 ENCOUNTER — Other Ambulatory Visit: Payer: Self-pay | Admitting: Nurse Practitioner

## 2023-12-20 ENCOUNTER — Other Ambulatory Visit (HOSPITAL_COMMUNITY): Payer: Self-pay

## 2023-12-20 ENCOUNTER — Other Ambulatory Visit: Payer: Self-pay

## 2023-12-20 DIAGNOSIS — I10 Essential (primary) hypertension: Secondary | ICD-10-CM

## 2023-12-20 MED ORDER — HYDROCHLOROTHIAZIDE 25 MG PO TABS
25.0000 mg | ORAL_TABLET | Freq: Every day | ORAL | 1 refills | Status: DC
  Filled 2023-12-20: qty 90, 90d supply, fill #0
  Filled 2024-03-18: qty 90, 90d supply, fill #1

## 2023-12-20 MED ORDER — LISINOPRIL 5 MG PO TABS
5.0000 mg | ORAL_TABLET | Freq: Every day | ORAL | 1 refills | Status: DC
  Filled 2023-12-20: qty 90, 90d supply, fill #0
  Filled 2024-03-18: qty 90, 90d supply, fill #1

## 2024-01-16 DIAGNOSIS — D045 Carcinoma in situ of skin of trunk: Secondary | ICD-10-CM | POA: Diagnosis not present

## 2024-01-16 DIAGNOSIS — C44529 Squamous cell carcinoma of skin of other part of trunk: Secondary | ICD-10-CM | POA: Diagnosis not present

## 2024-01-18 ENCOUNTER — Other Ambulatory Visit (HOSPITAL_COMMUNITY): Payer: Self-pay

## 2024-01-19 ENCOUNTER — Other Ambulatory Visit: Payer: Self-pay

## 2024-01-19 ENCOUNTER — Encounter: Payer: Self-pay | Admitting: Pharmacist

## 2024-01-19 ENCOUNTER — Other Ambulatory Visit (HOSPITAL_COMMUNITY): Payer: Self-pay

## 2024-01-29 ENCOUNTER — Encounter: Payer: Self-pay | Admitting: Gastroenterology

## 2024-01-31 ENCOUNTER — Ambulatory Visit: Payer: Self-pay

## 2024-01-31 NOTE — Telephone Encounter (Signed)
 Copied from CRM [id number]. Topic: Clinical - Red Word Triage >> Jan 31, 2024 10:58 AM Zipporah Him wrote: Red Word that prompted transfer to Nurse Triage: Pain left lower abdomen for 2 weeks, diarrhea, thought it was due to a couple things she had been ingesting but has stopped those and its still happening. A little nauseated. History of diverticulosis.   Chief Complaint: Left lower abd pain Symptoms: nausea and diarrhea Frequency: intermittent Pertinent Negatives: Patient denies fever Disposition: [] ED /[] Urgent Care (no appt availability in office) / [x] Appointment(In office/virtual)/ []  [facility]/ [] Home Care/ [] Refused Recommended Disposition /[] [place] [facility]/ []  Follow-up with PCP Additional Notes: appt scheduled for 5/15  Reason for Disposition  [1] MODERATE pain (e.g., interferes with normal activities) AND [2] pain comes and goes (cramps) AND [3] present > 24 hours  (Exception: Pain with Vomiting or Diarrhea - see that Guideline.)  Answer Assessment - Initial Assessment Questions 1. LOCATION: "Where does it hurt?"      Lower abdominal   2. RADIATION: "Does the pain shoot anywhere else?" (e.g., chest, back)     Left lower  3. ONSET: "When did the pain begin?" (e.g., minutes, hours or days ago)      2 weeks  4. SUDDEN: "Gradual or sudden onset?"     Gradual  5. PATTERN "Does the pain come and go, or is it constant?"    - If it comes and goes: "How long does it last?" "Do you have pain now?"     (Note: Comes and goes means the pain is intermittent. It goes away completely between bouts.)    - If constant: "Is it getting better, staying the same, or getting worse?"      (Note: Constant means the pain never goes away completely; most serious pain is constant and gets worse.)      Constant pressure, but worse when standing or moving too fast  6. SEVERITY: "How bad is the pain?"  (e.g., Scale 1-10; mild, moderate, or severe)    - MILD (1-3): Doesn't interfere  with normal activities, abdomen soft and not tender to touch.     - MODERATE (4-7): Interferes with normal activities or awakens from sleep, abdomen tender to touch.     - SEVERE (8-10): Excruciating pain, doubled over, unable to do any normal activities.       7/10 pain depending on what she currently does.  7. RECURRENT SYMPTOM: "Have you ever had this type of stomach pain before?" If Yes, ask: "When was the last time?" and "What happened that time?"      Yes, history of diverticulosis  8. CAUSE: "What do you think is causing the stomach pain?"     Unsure of cause, but was taking sugar substitute and glycosamie to see if that was cause  9. RELIEVING/AGGRAVATING FACTORS: "What makes it better or worse?" (e.g., antacids, bending or twisting motion, bowel movement)     10. OTHER SYMPTOMS: "Do you have any other symptoms?" (e.g., back pain, diarrhea, fever, urination pain, vomiting)       Diarrhea and nausea  11. PREGNANCY: "Is there any chance you are pregnant?" "When was your last menstrual period?"       no  Protocols used: Abdominal Pain - [facility]

## 2024-02-01 ENCOUNTER — Other Ambulatory Visit (HOSPITAL_COMMUNITY): Payer: Self-pay

## 2024-02-01 ENCOUNTER — Ambulatory Visit: Admitting: Family Medicine

## 2024-02-01 VITALS — BP 130/90 | HR 101 | Wt 181.2 lb

## 2024-02-01 DIAGNOSIS — K5792 Diverticulitis of intestine, part unspecified, without perforation or abscess without bleeding: Secondary | ICD-10-CM | POA: Diagnosis not present

## 2024-02-01 MED ORDER — METRONIDAZOLE 500 MG PO TABS
500.0000 mg | ORAL_TABLET | Freq: Three times a day (TID) | ORAL | 0 refills | Status: AC
  Filled 2024-02-01 (×2): qty 21, 7d supply, fill #0

## 2024-02-01 MED ORDER — CIPROFLOXACIN HCL 500 MG PO TABS
500.0000 mg | ORAL_TABLET | Freq: Two times a day (BID) | ORAL | 0 refills | Status: DC
  Filled 2024-02-01 (×2): qty 14, 7d supply, fill #0

## 2024-02-01 NOTE — Progress Notes (Signed)
   Subjective:    Patient ID: Monica Petty, female    DOB: [birth year], 58 y.o.   MRN: [id number]  HPI She states that she started having difficulty with diarrhea approximately 2 weeks ago having 4 stools per day.  No fever, chills, nausea, vomiting.  Discontinued and on Monday she developed some left lower quadrant pain and pressure with some nausea and anorexia.  She has had a previous colonoscopy which did show diverticuli but apparently was otherwise clear.  Presently she is having no fever, chills and no diarrhea in the last day because she has not eaten.  She states that the pain was a 7 and is now down to a 3.   Review of Systems     Objective:    Physical Exam Alert and complaining of left lower quadrant pain.  Abdominal exam shows decreased bowel sounds with tenderness in the left lower quadrant but no rebound or referred pain.  Less tenderness is noted in the mid and left lower quadrant pain.       Assessment & Plan:  Diverticulitis - Plan: metroNIDAZOLE  (FLAGYL ) 500 MG tablet, ciprofloxacin (CIPRO) 500 MG tablet Clinically she has diverticulitis and I will treat her as such.  She does have a penicillin allergy.  Discussed worsening of her symptoms in terms of fever, chills worsening pain and further evaluation including emergency room visit and scans.  She was comfortable with this approach.

## 2024-02-01 NOTE — Patient Instructions (Signed)
 Take Tylenol  for the pain 2 tablets 4 times per day and if you need more you can take either Advil or Aleve to help .  If your symptoms get worse in terms of fever, chills worsening left lower quadrant pain sick as hell!

## 2024-03-18 ENCOUNTER — Other Ambulatory Visit: Payer: Self-pay

## 2024-04-22 ENCOUNTER — Other Ambulatory Visit (HOSPITAL_COMMUNITY): Payer: Self-pay

## 2024-04-25 ENCOUNTER — Other Ambulatory Visit: Payer: Self-pay

## 2024-04-25 ENCOUNTER — Ambulatory Visit: Payer: Commercial Managed Care - PPO | Admitting: Nurse Practitioner

## 2024-04-25 ENCOUNTER — Other Ambulatory Visit (HOSPITAL_COMMUNITY): Payer: Self-pay

## 2024-04-25 VITALS — BP 134/84 | HR 74 | Ht 64.75 in | Wt 182.8 lb

## 2024-04-25 DIAGNOSIS — D692 Other nonthrombocytopenic purpura: Secondary | ICD-10-CM | POA: Diagnosis not present

## 2024-04-25 DIAGNOSIS — Z6830 Body mass index (BMI) 30.0-30.9, adult: Secondary | ICD-10-CM | POA: Diagnosis not present

## 2024-04-25 DIAGNOSIS — Z130 Encounter for screening for diseases of the blood and blood-forming organs and certain disorders involving the immune mechanism: Secondary | ICD-10-CM | POA: Diagnosis not present

## 2024-04-25 DIAGNOSIS — Z1329 Encounter for screening for other suspected endocrine disorder: Secondary | ICD-10-CM

## 2024-04-25 DIAGNOSIS — E66811 Obesity, class 1: Secondary | ICD-10-CM | POA: Diagnosis not present

## 2024-04-25 DIAGNOSIS — Z1321 Encounter for screening for nutritional disorder: Secondary | ICD-10-CM | POA: Diagnosis not present

## 2024-04-25 DIAGNOSIS — M150 Primary generalized (osteo)arthritis: Secondary | ICD-10-CM

## 2024-04-25 DIAGNOSIS — I10 Essential (primary) hypertension: Secondary | ICD-10-CM

## 2024-04-25 DIAGNOSIS — L719 Rosacea, unspecified: Secondary | ICD-10-CM | POA: Diagnosis not present

## 2024-04-25 DIAGNOSIS — Z13228 Encounter for screening for other metabolic disorders: Secondary | ICD-10-CM | POA: Diagnosis not present

## 2024-04-25 DIAGNOSIS — Z0000 Encounter for general adult medical examination without abnormal findings: Secondary | ICD-10-CM | POA: Diagnosis not present

## 2024-04-25 LAB — LIPID PANEL

## 2024-04-25 MED ORDER — MELOXICAM 15 MG PO TABS
15.0000 mg | ORAL_TABLET | Freq: Every day | ORAL | 2 refills | Status: AC
  Filled 2024-04-25: qty 30, 30d supply, fill #0
  Filled 2024-06-13 – 2024-06-24 (×2): qty 30, 30d supply, fill #1

## 2024-04-25 MED ORDER — LISINOPRIL 5 MG PO TABS
5.0000 mg | ORAL_TABLET | Freq: Every day | ORAL | 3 refills | Status: AC
  Filled 2024-04-25 – 2024-06-24 (×3): qty 90, 90d supply, fill #0
  Filled 2024-09-19: qty 90, 90d supply, fill #1

## 2024-04-25 MED ORDER — HYDROCHLOROTHIAZIDE 25 MG PO TABS
25.0000 mg | ORAL_TABLET | Freq: Every day | ORAL | 3 refills | Status: AC
  Filled 2024-04-25 – 2024-06-24 (×3): qty 90, 90d supply, fill #0
  Filled 2024-09-19: qty 90, 90d supply, fill #1

## 2024-04-25 NOTE — Assessment & Plan Note (Signed)
 No present concerns. Recommend wearing protective sleeves and moisturizer to reduce risks of bruising.

## 2024-04-25 NOTE — Assessment & Plan Note (Signed)
 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 Symptoms managed with ibuprofen and CBD salve. Previous meloxicam  use was effective but not sustainable for long-term use. - Prescribe meloxicam  for use as needed for severe pain episodes. - Continue using ibuprofen and CBD salve for symptom management.

## 2024-04-25 NOTE — Progress Notes (Signed)
 Catheline Doing, DNP, AGNP-c [facility] [address] [place], [place] [address] Main Office [phone]  BP 134/84   Pulse 74   Ht 5' 4.75 (1.645 m)   Wt 182 lb 12.8 oz (82.9 kg)   BMI 30.65 kg/m    Subjective:    Patient ID: Monica Petty, female    DOB: 1966-04-09, 58 y.o.   MRN: [id number]  Blood pressures slightly above goal today. Prescribed Hydrochlorothiazide  25mg , lisinopril  5mg .   HPI: History of Present Illness Monica Petty is a 58 year old female who presents for her annual physical exam.  Her home blood pressure readings are generally within normal range, with a recent reading of 118/67 mmHg, although today's visit showed a slight elevation. She is on lisinopril  5 mg and hydrochlorothiazide  25 mg for hypertension management.  She experienced a past episode of diverticulitis, which was her first occurrence and has resolved without further issues. She reports weight loss, which she attributes to a diet plan that includes fasting and no dairy.  She has arthritis in her right foot, confirmed by an x-ray. An injection provided relief for about a week, and meloxicam  was very effective. Currently, she uses ibuprofen and CBD salve for pain management.  She is using Estrace  cream and Premo for estrogen therapy, which she finds helpful. No vaginal bleeding, chest pain, shortness of breath, dizziness, or changes in bowel or bladder habits. No concerns with hearing or vision.  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 The area was itchy prior to removal.  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 She has a history of hepatitis B vaccination and is frustrated with the recurring need to verify this.  Pertinent items are noted in HPI.  She is scheduled for Mammo and Pap in September with Dr. Dannielle at [facility].   Most Recent  Depression Screen:     04/25/2024    8:26 AM 04/25/2023    8:24 AM 05/26/2022    4:52 PM  Depression screen PHQ 2/9  Decreased Interest 0 0 0  Down, Depressed, Hopeless 0 0 0  PHQ - 2 Score 0 0 0   Most Recent Anxiety Screen:      No data to display         Most Recent Fall Screen:    04/25/2024    8:26 AM 04/25/2023    8:24 AM 05/26/2022    4:50 PM  Fall Risk   Falls in the past year? 0 0 0  Number falls in past yr: 0 0 0  Injury with Fall? 0 0 0  Risk for fall due to : No Fall Risks No Fall Risks No Fall Risks  Follow up Falls evaluation completed Falls evaluation completed Education provided      Data saved with a previous flowsheet row definition    Past medical history, surgical history, medications, allergies, family history and social history reviewed with patient today and changes made to appropriate areas of the chart.  Past Medical History:  Past Medical History:  Diagnosis Date   Allergy    Arthritis    Complication of anesthesia    sob after 05-23-2019 sx for 1-2 days after got home   Diverticulosis of colon    GERD (gastroesophageal reflux disease)    hx of   History of colon polyps    History of concussion  1987  fell of horse--- no residual   Hypertension    Migraine    occasional   Rosacea    Rotator cuff tear    recurrent right side   Medications:  Current Outpatient Medications on File Prior to Visit  Medication Sig   estradiol  (ESTRACE ) 0.1 MG/GM vaginal cream INSERT 1 GRAM VAGINALLY ONCE A WEEK   estrogens , conjugated, (PREMARIN ) 0.625 MG tablet Take 1 tablet (0.625 mg total) by mouth daily.   ibuprofen (ADVIL) 200 MG tablet Take 200 mg by mouth every 6 (six) hours as needed. (Patient taking differently: Take 200 mg by mouth every 6 (six) hours as needed. 800 mg at bedtime)   Multiple Vitamins-Minerals (MULTIVITAMIN ADULT PO) Take 1 capsule by mouth daily.    tretinoin  (RETIN-A ) 0.025 % cream Apply to affected area once a day as needed for rash  breakout.   cycloSPORINE  (RESTASIS ) 0.05 % ophthalmic emulsion INSTILL 1 DROP INTO AFFECTED EYE(S) BY OPHTHALMIC ROUTE EVERY 12 HOURS   No current facility-administered medications on file prior to visit.   Surgical History:  Past Surgical History:  Procedure Laterality Date   COLONOSCOPY  06/06/2016   COSMETIC SURGERY  Abdominoplasty 5/23   LAPAROSCOPIC CHOLECYSTECTOMY  06-03-2008   dr forbes  [facility]   REDUCTION MAMMAPLASTY Bilateral 03-05-2002   dr barber  [facility]   SHOULDER ARTHROSCOPY WITH ROTATOR CUFF REPAIR Right 05/23/2019   Procedure: SHOULDER ARTHROSCOPY WITH revision rotator cuff repair and  debirdement;  Surgeon: Gerome Charleston, MD;  Location: [facility];  Service: Orthopedics;  Laterality: Right;  interscalene block   SHOULDER ARTHROSCOPY WITH ROTATOR CUFF REPAIR Right 12/05/2019   Procedure: SHOULDER ARTHROSCOPY debridement revision rotator cuff repair, , with bone marrow aspiration from humerus;  Surgeon: Gerome Charleston, MD;  Location: [facility];  Service: Orthopedics;  Laterality: Right;  exparel  interscalene block   SHOULDER ARTHROSCOPY WITH ROTATOR CUFF REPAIR AND SUBACROMIAL DECOMPRESSION Right 04/17/2018   Procedure: Right shoulder evaluation under anesthesia, scope, debridement, subacromial decompression, rotator cuff repair;  Surgeon: Gerome Charleston, MD;  Location: [facility];  Service: Orthopedics;  Laterality: Right;  120 mins General with Intra Scalene Block   VAGINAL HYSTERECTOMY  02-12-2002   dr rosalynn  [facility]   WISDOM TOOTH EXTRACTION  teen   Allergies:  Allergies  Allergen Reactions   Adhesive [Tape] Other (See Comments)    Rips skin, likes paper tape   Codeine Other (See Comments)    tinnitis and photophobia    Penicillins Rash   Sulfa Antibiotics Other (See Comments)    tinnitis and photophobia   Family History:  Family History  Problem Relation Age of Onset   Colon polyps Mother    Hypertension Mother     Cancer Father    Hypertension Father    Diabetes Brother    Breast cancer Paternal Aunt    Breast cancer Other    Colon cancer Neg Hx    Esophageal cancer Neg Hx    Rectal cancer Neg Hx    Stomach cancer Neg Hx        Objective:    BP 134/84   Pulse 74   Ht 5' 4.75 (1.645 m)   Wt 182 lb 12.8 oz (82.9 kg)   BMI 30.65 kg/m   Wt Readings from Last 3 Encounters:  04/25/24 182 lb 12.8 oz (82.9 kg)  02/01/24 181 lb 3.2 oz (82.2 kg)  12/04/23 190 lb (86.2 kg)    Physical Exam Vitals and  nursing note reviewed.  Constitutional:      General: She is not in acute distress.    Appearance: Normal appearance.  HENT:     Head: Normocephalic and atraumatic.     Right Ear: Hearing, tympanic membrane, ear canal and external ear normal.     Left Ear: Hearing, tympanic membrane, ear canal and external ear normal.     Nose: Nose normal.     Right Sinus: No maxillary sinus tenderness or frontal sinus tenderness.     Left Sinus: No maxillary sinus tenderness or frontal sinus tenderness.     Mouth/Throat:     Lips: Pink.     Mouth: Mucous membranes are moist.     Pharynx: Oropharynx is clear.  Eyes:     General: Lids are normal. Vision grossly intact.     Extraocular Movements: Extraocular movements intact.     Conjunctiva/sclera: Conjunctivae normal.     Pupils: Pupils are equal, round, and reactive to light.     Funduscopic exam:    Right eye: Red reflex present.        Left eye: Red reflex present.    Visual Fields: Right eye visual fields normal and left eye visual fields normal.  Neck:     Thyroid : No thyromegaly.     Vascular: No carotid bruit.  Cardiovascular:     Rate and Rhythm: Normal rate and regular rhythm.     Chest Wall: PMI is not displaced.     Pulses: Normal pulses.          Dorsalis pedis pulses are 2+ on the right side and 2+ on the left side.       Posterior tibial pulses are 2+ on the right side and 2+ on the left side.     Heart sounds: Normal heart sounds.  No murmur heard. Pulmonary:     Effort: Pulmonary effort is normal. No respiratory distress.     Breath sounds: Normal breath sounds.  Abdominal:     General: Abdomen is flat. Bowel sounds are normal. There is no distension.     Palpations: Abdomen is soft. There is no hepatomegaly, splenomegaly or mass.     Tenderness: There is no abdominal tenderness. There is no right CVA tenderness, left CVA tenderness, guarding or rebound.  Musculoskeletal:        General: Normal range of motion.     Cervical back: Full passive range of motion without pain, normal range of motion and neck supple. No tenderness.     Right lower leg: No edema.     Left lower leg: No edema.  Feet:     Left foot:     Toenail Condition: Left toenails are normal.  Lymphadenopathy:     Cervical: No cervical adenopathy.     Upper Body:     Right upper body: No supraclavicular adenopathy.     Left upper body: No supraclavicular adenopathy.  Skin:    General: Skin is warm and dry.     Capillary Refill: Capillary refill takes less than 2 seconds.     Nails: There is no clubbing.  Neurological:     General: No focal deficit present.     Mental Status: She is alert and oriented to person, place, and time.     GCS: GCS eye subscore is 4. GCS verbal subscore is 5. GCS motor subscore is 6.     Sensory: Sensation is intact. No sensory deficit.     Motor: Motor function is  intact. No weakness.     Coordination: Coordination is intact.     Gait: Gait is intact. Gait normal.     Deep Tendon Reflexes: Reflexes are normal and symmetric.  Psychiatric:        Attention and Perception: Attention normal.        Mood and Affect: Mood normal.        Speech: Speech normal.        Behavior: Behavior normal. Behavior is cooperative.        Thought Content: Thought content normal.        Cognition and Memory: Cognition and memory normal.        Judgment: Judgment normal.      Results for orders placed or performed in visit on  12/04/23  Surgical pathology ([facility])   Collection Time: 12/04/23 12:00 AM  Result Value Ref Range   SURGICAL PATHOLOGY      SURGICAL PATHOLOGY [facility] [address] [place], [place] [address] Telephone [phone] or [phone] Fax [phone]  REPORT OF SURGICAL PATHOLOGY   Accession #: [id number] Patient Name: BRISSA, ASANTE Visit # : [id number]  MRN: [id number] Physician: Shila Gustav [place] DOB/Age 04-14-1966 (Age: 79) Gender: F Collected Date: 12/04/2023 Received Date: 12/06/2023  FINAL DIAGNOSIS       1. Surgical [P], colon, descending, polyp (1) :       -  COLONIC MUCOSA WITH A PROMINENT LYMPHOID AGGREGATE.       DATE SIGNED OUT: 12/07/2023 ELECTRONIC SIGNATURE : Legolvan Do, Mark, Pathologist, Electronic Signature  MICROSCOPIC DESCRIPTION  CASE COMMENTS STAINS USED IN DIAGNOSIS: H&E    CLINICAL HISTORY  SPECIMEN(S) OBTAINED 1. Surgical [P], Colon, Descending, Polyp (1)  SPECIMEN COMMENTS: 1. Hx of colonic polyps; benign neoplasm of descending colon SPECIMEN CLINICAL INFORMATION: 1. R/O adenoma    Gross Descr iption 1. Received in formalin are tan, soft tissue fragments that are submitted in toto. Number: 1 Size: 0.6 cm, (1B) ( TA )        Report signed out from the following location(s) [place] [address] [place], [place] [address] CLIA #: [id number]  [facility] [address] [place], [place] [address] CLIA #: [id number]        Assessment & Plan:   Problem List Items Addressed This Visit     Essential hypertension   Blood pressure slightly elevated in the office, likely due to white coat hypertension. Home readings are within normal range. Managed with lisinopril  5 mg and hydrochlorothiazide  25 mg. - Continue current antihypertensive regimen with lisinopril  and hydrochlorothiazide . - Monitor blood pressure at home and report any  consistent elevations.      Relevant Medications   hydrochlorothiazide  (HYDRODIURIL ) 25 MG tablet   lisinopril  (ZESTRIL ) 5 MG tablet   Other Relevant Orders   Hemoglobin A1c   CBC with Differential/Platelet   Comprehensive metabolic panel with GFR   Lipid panel   VITAMIN D  25 Hydroxy (Vit-D Deficiency, Fractures)   TSH   Rosacea   OK to refill tretinoin  as needed for management. No concerns at this time.       Senile purpura (HCC)   No present concerns. Recommend wearing protective sleeves and moisturizer to reduce risks of bruising.       Relevant Medications   hydrochlorothiazide  (HYDRODIURIL ) 25 MG tablet   lisinopril  (ZESTRIL ) 5 MG tablet   Other Relevant Orders   Hemoglobin A1c   CBC with  Differential/Platelet   Comprehensive metabolic panel with GFR   Lipid panel   VITAMIN D  25 Hydroxy (Vit-D Deficiency, Fractures)   TSH   Class 1 obesity with serious comorbidity and body mass index (BMI) of 30.0 to 30.9 in adult   Recent weight loss of 12 pounds. Following a diet plan with fasting and no dairy, which has been effective. Goal weight is 160 pounds.She is working with [employer] [facility]. - Encourage continuation of current diet plan and lifestyle modifications to achieve weight loss goals.       Relevant Orders   Hemoglobin A1c   CBC with Differential/Platelet   Comprehensive metabolic panel with GFR   Lipid panel   VITAMIN D  25 Hydroxy (Vit-D Deficiency, Fractures)   TSH   Encounter for annual physical exam - Primary   CPE completed today. Review of HM activities and recommendations discussed and provided on AVS. Anticipatory guidance, diet, and exercise recommendations provided. Medications, allergies, and hx reviewed and updated as necessary. Orders placed as listed below.  Plan: - Labs ordered. Will make changes as necessary based on results.  - I will review these results and send recommendations via MyChart or a telephone call.  - F/U with CPE in 1 year  or sooner for acute/chronic health needs as directed.        Primary osteoarthritis involving multiple joints   Chronic osteoarthritis in the right foot confirmed by previous x-ray. Symptoms managed with ibuprofen and CBD salve. Previous meloxicam  use was effective but not sustainable for long-term use. - Prescribe meloxicam  for use as needed for severe pain episodes. - Continue using ibuprofen and CBD salve for symptom management.      Relevant Medications   ibuprofen (ADVIL) 200 MG tablet   meloxicam  (MOBIC ) 15 MG tablet   Other Relevant Orders   Hemoglobin A1c   CBC with Differential/Platelet   Comprehensive metabolic panel with GFR   Lipid panel   VITAMIN D  25 Hydroxy (Vit-D Deficiency, Fractures)   TSH   Other Visit Diagnoses       Screening for endocrine, nutritional, metabolic and immunity disorder       Relevant Orders   Hemoglobin A1c   CBC with Differential/Platelet   Comprehensive metabolic panel with GFR   Lipid panel   VITAMIN D  25 Hydroxy (Vit-D Deficiency, Fractures)   TSH         Follow up plan: Return in about 1 year (around 04/25/2025) for CPE.  NEXT PREVENTATIVE PHYSICAL DUE IN 1 YEAR.  PATIENT COUNSELING PROVIDED FOR ALL ADULT PATIENTS: A well balanced diet low in saturated fats, cholesterol, and moderation in carbohydrates.  This can be as simple as monitoring portion sizes and cutting back on sugary beverages such as soda and juice to start with.    Daily water consumption of at least 64 ounces.  Physical activity at least 180 minutes per week.  If just starting out, start 10 minutes a day and work your way up.   This can be as simple as taking the stairs instead of the elevator and walking 2-3 laps around the office  purposefully every day.   STD protection, partner selection, and regular testing if high risk.  Limited consumption of alcoholic beverages if alcohol is consumed. For men, I recommend no more than 14 alcoholic beverages per week,  spread out throughout the week (max 2 per day). Avoid binge drinking or consuming large quantities of alcohol in one setting.  Please let me know if you feel  you may need help with reduction or quitting alcohol consumption.   Avoidance of nicotine, if used. Please let me know if you feel you may need help with reduction or quitting nicotine use.   Daily mental health attention. This can be in the form of 5 minute daily meditation, prayer, journaling, yoga, reflection, etc.  Purposeful attention to your emotions and mental state can significantly improve your overall wellbeing  and  Health.  Please know that I am here to help you with all of your health care goals and am happy to work with you to find a solution that works best for you.  The greatest advice I have received with any changes in life are to take it one step at a time, that even means if all you can focus on is the next 60 seconds, then do that and celebrate your victories.  With any changes in life, you will have set backs, and that is OK. The important thing to remember is, if you have a set back, it is not a failure, it is an opportunity to try again! Screening Testing Mammogram Every 1 -2 years based on history and risk factors Starting at age 41 Pap Smear Ages 21-39 every 3 years Ages 33-65 every 5 years with HPV testing More frequent testing may be required based on results and history Colon Cancer Screening Every 1-10 years based on test performed, risk factors, and history Starting at age 83 Bone Density Screening Every 2-10 years based on history Starting at age 84 for women Recommendations for men differ based on medication usage, history, and risk factors AAA Screening One time ultrasound Men 15-39 years old who have every smoked Lung Cancer Screening Low Dose Lung CT every 12 months Age 59-80 years with a 30 pack-year smoking history who still smoke or who have quit within the last 15 years   Screening  Labs Routine  Labs: Complete Blood Count (CBC), Complete Metabolic Panel (CMP), Cholesterol (Lipid Panel) Every 6-12 months based on history and medications May be recommended more frequently based on current conditions or previous results Hemoglobin A1c Lab Every 3-12 months based on history and previous results Starting at age 8 or earlier with diagnosis of diabetes, high cholesterol, BMI >26, and/or risk factors Frequent monitoring for patients with diabetes to ensure blood sugar control Thyroid  Panel (TSH) Every 6 months based on history, symptoms, and risk factors May be repeated more often if on medication HIV One time testing for all patients 66 and older May be repeated more frequently for patients with increased risk factors or exposure Hepatitis C One time testing for all patients 17 and older May be repeated more frequently for patients with increased risk factors or exposure Gonorrhea, Chlamydia Every 12 months for all sexually active persons 13-24 years Additional monitoring may be recommended for those who are considered high risk or who have symptoms Every 12 months for any woman on birth control, regardless of sexual activity PSA Men 81-11 years old with risk factors Additional screening may be recommended from age 7-69 based on risk factors, symptoms, and history  Vaccine Recommendations Tetanus Booster All adults every 10 years Flu Vaccine All patients 6 months and older every year COVID Vaccine All patients 12 years and older Initial dosing with booster May recommend additional booster based on age and health history HPV Vaccine 2 doses all patients age 90-26 Dosing may be considered for patients over 26 Shingles Vaccine (Shingrix) 2 doses all adults 55 years and  older Pneumonia (Pneumovax 23) All adults 65 years and older May recommend earlier dosing based on health history One year apart from Prevnar 13 Pneumonia (Prevnar 62) All adults 65 years and  older Dosed 1 year after Pneumovax 23 Pneumonia (Prevnar 20) One time alternative to the two dosing of 13 and 23 For all adults with initial dose of 23, 20 is recommended 1 year later For all adults with initial dose of 13, 23 is still recommended as second option 1 year later

## 2024-04-25 NOTE — Assessment & Plan Note (Signed)

## 2024-04-25 NOTE — Assessment & Plan Note (Signed)
 OK to refill tretinoin  as needed for management. No concerns at this time.

## 2024-04-25 NOTE — Patient Instructions (Addendum)
 Keep a close eye on your blood pressure at home by checking 1-2 days a week. Let me know if these are running higher than 130/80 on average.   Please as Dr. Dannielle to send the pap and mammogram results to us  so we can update your record here. Our fax is [phone].  Let me know if you need refills on your tretinoin  in the future.   I have sent the meloxicam  for you to use as needed when your pain is high.   Congratulations on the new title!!!

## 2024-04-25 NOTE — Assessment & Plan Note (Addendum)
 Recent weight loss of 12 pounds. Following a diet plan with fasting and no dairy, which has been effective. Goal weight is 160 pounds.She is working with [employer] [facility]. - Encourage continuation of current diet plan and lifestyle modifications to achieve weight loss goals.

## 2024-04-25 NOTE — Assessment & Plan Note (Signed)
 Blood pressure slightly elevated in the office, likely due to white coat hypertension. Home readings are within normal range. Managed with lisinopril  5 mg and hydrochlorothiazide  25 mg. - Continue current antihypertensive regimen with lisinopril  and hydrochlorothiazide . - Monitor blood pressure at home and report any consistent elevations.

## 2024-04-26 LAB — CBC WITH DIFFERENTIAL/PLATELET
Basophils Absolute: 0.1 x10E3/uL (ref 0.0–0.2)
Basos: 1 %
EOS (ABSOLUTE): 0.1 x10E3/uL (ref 0.0–0.4)
Eos: 2 %
Hematocrit: 46.3 % (ref 34.0–46.6)
Hemoglobin: 14.8 g/dL (ref 11.1–15.9)
Immature Grans (Abs): 0 x10E3/uL (ref 0.0–0.1)
Immature Granulocytes: 0 %
Lymphocytes Absolute: 1.9 x10E3/uL (ref 0.7–3.1)
Lymphs: 36 %
MCH: 30 pg (ref 26.6–33.0)
MCHC: 32 g/dL (ref 31.5–35.7)
MCV: 94 fL (ref 79–97)
Monocytes Absolute: 0.4 x10E3/uL (ref 0.1–0.9)
Monocytes: 8 %
Neutrophils Absolute: 2.9 x10E3/uL (ref 1.4–7.0)
Neutrophils: 53 %
Platelets: 297 x10E3/uL (ref 150–450)
RBC: 4.94 x10E6/uL (ref 3.77–5.28)
RDW: 12.8 % (ref 11.7–15.4)
WBC: 5.5 x10E3/uL (ref 3.4–10.8)

## 2024-04-26 LAB — VITAMIN D 25 HYDROXY (VIT D DEFICIENCY, FRACTURES): Vit D, 25-Hydroxy: 137 ng/mL — AB (ref 30.0–100.0)

## 2024-04-26 LAB — COMPREHENSIVE METABOLIC PANEL WITH GFR
ALT: 16 IU/L (ref 0–32)
AST: 15 IU/L (ref 0–40)
Albumin: 4.4 g/dL (ref 3.8–4.9)
Alkaline Phosphatase: 72 IU/L (ref 44–121)
BUN/Creatinine Ratio: 27 — AB (ref 9–23)
BUN: 17 mg/dL (ref 6–24)
Bilirubin Total: 0.4 mg/dL (ref 0.0–1.2)
CO2: 24 mmol/L (ref 20–29)
Calcium: 10 mg/dL (ref 8.7–10.2)
Chloride: 99 mmol/L (ref 96–106)
Creatinine, Ser: 0.63 mg/dL (ref 0.57–1.00)
Globulin, Total: 2.5 g/dL (ref 1.5–4.5)
Glucose: 93 mg/dL (ref 70–99)
Potassium: 4.4 mmol/L (ref 3.5–5.2)
Sodium: 139 mmol/L (ref 134–144)
Total Protein: 6.9 g/dL (ref 6.0–8.5)
eGFR: 103 mL/min/1.73 (ref >59)

## 2024-04-26 LAB — LIPID PANEL
Cholesterol, Total: 206 mg/dL — AB (ref 100–199)
HDL: 88 mg/dL (ref >39)
LDL CALC COMMENT:: 2.3 ratio (ref 0.0–4.4)
LDL Chol Calc (NIH): 102 mg/dL — AB (ref 0–99)
Triglycerides: 93 mg/dL (ref 0–149)
VLDL Cholesterol Cal: 16 mg/dL (ref 5–40)

## 2024-04-26 LAB — TSH: TSH: 1.45 u[IU]/mL (ref 0.450–4.500)

## 2024-04-26 LAB — HEMOGLOBIN A1C
Est. average glucose Bld gHb Est-mCnc: 105 mg/dL
Hgb A1c MFr Bld: 5.3 % (ref 4.8–5.6)

## 2024-04-30 ENCOUNTER — Ambulatory Visit: Payer: Self-pay | Admitting: Nurse Practitioner

## 2024-05-09 DIAGNOSIS — L910 Hypertrophic scar: Secondary | ICD-10-CM | POA: Diagnosis not present

## 2024-06-13 ENCOUNTER — Other Ambulatory Visit (HOSPITAL_COMMUNITY): Payer: Self-pay

## 2024-06-13 ENCOUNTER — Other Ambulatory Visit: Payer: Self-pay

## 2024-06-14 ENCOUNTER — Other Ambulatory Visit: Payer: Self-pay

## 2024-06-19 ENCOUNTER — Other Ambulatory Visit: Payer: Self-pay

## 2024-06-24 ENCOUNTER — Telehealth: Payer: Self-pay | Admitting: Nurse Practitioner

## 2024-06-24 ENCOUNTER — Other Ambulatory Visit: Payer: Self-pay

## 2024-06-24 ENCOUNTER — Other Ambulatory Visit (HOSPITAL_COMMUNITY): Payer: Self-pay

## 2024-06-24 NOTE — Telephone Encounter (Signed)
 Left message for pt to call me back

## 2024-06-25 ENCOUNTER — Other Ambulatory Visit: Payer: Self-pay

## 2024-06-25 ENCOUNTER — Other Ambulatory Visit (HOSPITAL_COMMUNITY): Payer: Self-pay

## 2024-06-25 ENCOUNTER — Encounter: Payer: Self-pay | Admitting: Pharmacist

## 2024-07-25 ENCOUNTER — Other Ambulatory Visit: Payer: Self-pay

## 2024-07-25 ENCOUNTER — Encounter (HOSPITAL_COMMUNITY): Payer: Self-pay

## 2024-07-25 ENCOUNTER — Other Ambulatory Visit (HOSPITAL_COMMUNITY): Payer: Self-pay

## 2024-07-25 DIAGNOSIS — Z6830 Body mass index (BMI) 30.0-30.9, adult: Secondary | ICD-10-CM | POA: Diagnosis not present

## 2024-07-25 DIAGNOSIS — Z13820 Encounter for screening for osteoporosis: Secondary | ICD-10-CM | POA: Diagnosis not present

## 2024-07-25 DIAGNOSIS — Z1231 Encounter for screening mammogram for malignant neoplasm of breast: Secondary | ICD-10-CM | POA: Diagnosis not present

## 2024-07-25 DIAGNOSIS — Z01419 Encounter for gynecological examination (general) (routine) without abnormal findings: Secondary | ICD-10-CM | POA: Diagnosis not present

## 2024-07-25 MED ORDER — ESTROGENS CONJUGATED 0.625 MG PO TABS
0.6250 mg | ORAL_TABLET | Freq: Every day | ORAL | 3 refills | Status: AC
  Filled 2024-07-25 – 2024-07-26 (×2): qty 90, 90d supply, fill #0
  Filled 2024-10-19: qty 90, 90d supply, fill #1

## 2024-07-25 MED ORDER — ESTRADIOL 0.01 % VA CREA
1.0000 g | TOPICAL_CREAM | VAGINAL | 3 refills | Status: AC
  Filled 2024-07-25: qty 42.5, 90d supply, fill #0

## 2024-07-26 ENCOUNTER — Other Ambulatory Visit (HOSPITAL_COMMUNITY): Payer: Self-pay

## 2024-07-26 ENCOUNTER — Other Ambulatory Visit: Payer: Self-pay

## 2024-07-30 DIAGNOSIS — L910 Hypertrophic scar: Secondary | ICD-10-CM | POA: Diagnosis not present

## 2024-07-30 DIAGNOSIS — L570 Actinic keratosis: Secondary | ICD-10-CM | POA: Diagnosis not present

## 2024-08-01 ENCOUNTER — Other Ambulatory Visit: Payer: Self-pay | Admitting: Nurse Practitioner

## 2024-08-01 ENCOUNTER — Other Ambulatory Visit (HOSPITAL_COMMUNITY): Payer: Self-pay

## 2024-08-01 ENCOUNTER — Other Ambulatory Visit: Payer: Self-pay

## 2024-08-01 ENCOUNTER — Other Ambulatory Visit (HOSPITAL_BASED_OUTPATIENT_CLINIC_OR_DEPARTMENT_OTHER): Payer: Self-pay

## 2024-08-01 DIAGNOSIS — L719 Rosacea, unspecified: Secondary | ICD-10-CM

## 2024-08-01 MED ORDER — TRETINOIN 0.025 % EX CREA
TOPICAL_CREAM | CUTANEOUS | 1 refills | Status: AC
  Filled 2024-08-01: qty 45, 30d supply, fill #0
  Filled 2024-09-19: qty 45, 30d supply, fill #1

## 2024-08-19 ENCOUNTER — Other Ambulatory Visit: Payer: Self-pay | Admitting: Nurse Practitioner

## 2024-08-19 ENCOUNTER — Other Ambulatory Visit (HOSPITAL_COMMUNITY): Payer: Self-pay

## 2024-08-19 MED ORDER — CYCLOSPORINE 0.05 % OP EMUL
OPHTHALMIC | 3 refills | Status: AC
  Filled 2024-08-19: qty 180, 90d supply, fill #0

## 2024-08-20 ENCOUNTER — Other Ambulatory Visit: Payer: Self-pay

## 2024-08-20 ENCOUNTER — Other Ambulatory Visit (HOSPITAL_COMMUNITY): Payer: Self-pay

## 2024-09-20 ENCOUNTER — Other Ambulatory Visit (HOSPITAL_COMMUNITY): Payer: Self-pay

## 2024-10-20 ENCOUNTER — Other Ambulatory Visit (HOSPITAL_COMMUNITY): Payer: Self-pay

## 2025-04-25 ENCOUNTER — Encounter: Payer: Self-pay | Admitting: Nurse Practitioner
